# Patient Record
Sex: Male | Born: 1980 | Race: Black or African American | Hispanic: No | Smoking: Former smoker
Health system: Southern US, Community
[De-identification: ages and names within clinical notes are randomized; demographics above are authoritative.]

## PROBLEM LIST (undated history)

## (undated) DIAGNOSIS — E785 Hyperlipidemia, unspecified: Secondary | ICD-10-CM

## (undated) DIAGNOSIS — K219 Gastro-esophageal reflux disease without esophagitis: Secondary | ICD-10-CM

## (undated) DIAGNOSIS — M419 Scoliosis, unspecified: Secondary | ICD-10-CM

## (undated) DIAGNOSIS — T7840XA Allergy, unspecified, initial encounter: Secondary | ICD-10-CM

## (undated) DIAGNOSIS — G40909 Epilepsy, unspecified, not intractable, without status epilepticus: Secondary | ICD-10-CM

## (undated) DIAGNOSIS — F419 Anxiety disorder, unspecified: Secondary | ICD-10-CM

## (undated) DIAGNOSIS — F32A Depression, unspecified: Secondary | ICD-10-CM

## (undated) DIAGNOSIS — G4730 Sleep apnea, unspecified: Secondary | ICD-10-CM

## (undated) DIAGNOSIS — F4310 Post-traumatic stress disorder, unspecified: Secondary | ICD-10-CM

## (undated) DIAGNOSIS — I10 Essential (primary) hypertension: Secondary | ICD-10-CM

## (undated) DIAGNOSIS — G709 Myoneural disorder, unspecified: Secondary | ICD-10-CM

## (undated) DIAGNOSIS — M1990 Unspecified osteoarthritis, unspecified site: Secondary | ICD-10-CM

## (undated) DIAGNOSIS — J189 Pneumonia, unspecified organism: Secondary | ICD-10-CM

## (undated) DIAGNOSIS — M51369 Other intervertebral disc degeneration, lumbar region without mention of lumbar back pain or lower extremity pain: Secondary | ICD-10-CM

## (undated) DIAGNOSIS — D649 Anemia, unspecified: Secondary | ICD-10-CM

## (undated) DIAGNOSIS — M5126 Other intervertebral disc displacement, lumbar region: Secondary | ICD-10-CM

## (undated) DIAGNOSIS — F329 Major depressive disorder, single episode, unspecified: Secondary | ICD-10-CM

## (undated) HISTORY — DX: Allergy, unspecified, initial encounter: T78.40XA

## (undated) HISTORY — DX: Hyperlipidemia, unspecified: E78.5

## (undated) HISTORY — DX: Sleep apnea, unspecified: G47.30

## (undated) HISTORY — PX: NO PAST SURGERIES: SHX2092

---

## 2006-06-16 ENCOUNTER — Ambulatory Visit: Payer: Self-pay | Admitting: Internal Medicine

## 2006-06-16 ENCOUNTER — Encounter (INDEPENDENT_AMBULATORY_CARE_PROVIDER_SITE_OTHER): Payer: Self-pay | Admitting: Unknown Physician Specialty

## 2006-06-16 LAB — CONVERTED CEMR LAB: Varicella IgG: 4.14 — ABNORMAL HIGH

## 2008-03-11 ENCOUNTER — Ambulatory Visit: Payer: Self-pay | Admitting: Internal Medicine

## 2008-03-11 DIAGNOSIS — D649 Anemia, unspecified: Secondary | ICD-10-CM | POA: Insufficient documentation

## 2008-03-11 DIAGNOSIS — E1169 Type 2 diabetes mellitus with other specified complication: Secondary | ICD-10-CM | POA: Insufficient documentation

## 2008-03-11 DIAGNOSIS — E1165 Type 2 diabetes mellitus with hyperglycemia: Secondary | ICD-10-CM

## 2008-03-11 LAB — CONVERTED CEMR LAB
ALT: 34 units/L (ref 0–53)
Albumin: 3.9 g/dL (ref 3.5–5.2)
BUN: 10 mg/dL (ref 6–23)
Basophils Absolute: 0.4 10*3/uL — ABNORMAL HIGH (ref 0.0–0.1)
Bilirubin Urine: NEGATIVE
Calcium: 9.5 mg/dL (ref 8.4–10.5)
Cholesterol: 190 mg/dL (ref 0–200)
Creatinine, Ser: 1 mg/dL (ref 0.4–1.5)
Crystals: NEGATIVE
Eosinophils Absolute: 0.1 10*3/uL (ref 0.0–0.7)
GFR calc non Af Amer: 97 mL/min
Hemoglobin, Urine: NEGATIVE
Hemoglobin: 12.9 g/dL — ABNORMAL LOW (ref 13.0–17.0)
Hgb A1c MFr Bld: 7.9 % — ABNORMAL HIGH (ref 4.6–6.0)
LDL Cholesterol: 138 mg/dL — ABNORMAL HIGH (ref 0–99)
Leukocytes, UA: NEGATIVE
Lymphocytes Relative: 18.6 % (ref 12.0–46.0)
MCHC: 33.4 g/dL (ref 30.0–36.0)
MCV: 81.5 fL (ref 78.0–100.0)
Monocytes Absolute: 0.6 10*3/uL (ref 0.1–1.0)
Neutro Abs: 9.8 10*3/uL — ABNORMAL HIGH (ref 1.4–7.7)
Nitrite: NEGATIVE
RDW: 13.8 % (ref 11.5–14.6)
TSH: 1.03 microintl units/mL (ref 0.35–5.50)
Total Bilirubin: 0.6 mg/dL (ref 0.3–1.2)
Triglycerides: 120 mg/dL (ref 0–149)
Urobilinogen, UA: 0.2 (ref 0.0–1.0)
pH: 6 (ref 5.0–8.0)

## 2008-03-12 ENCOUNTER — Encounter: Payer: Self-pay | Admitting: Internal Medicine

## 2008-03-26 ENCOUNTER — Ambulatory Visit: Payer: Self-pay | Admitting: Internal Medicine

## 2008-03-26 LAB — CONVERTED CEMR LAB
Basophils Absolute: 0.1 10*3/uL (ref 0.0–0.1)
Hemoglobin: 13.7 g/dL (ref 13.0–17.0)
Iron: 41 ug/dL — ABNORMAL LOW (ref 42–165)
Lymphocytes Relative: 13 % (ref 12.0–46.0)
MCHC: 33.1 g/dL (ref 30.0–36.0)
Monocytes Relative: 1.4 % — ABNORMAL LOW (ref 3.0–12.0)
Neutro Abs: 9.4 10*3/uL — ABNORMAL HIGH (ref 1.4–7.7)
Neutrophils Relative %: 82.1 % — ABNORMAL HIGH (ref 43.0–77.0)
RBC: 5.14 M/uL (ref 4.22–5.81)
RDW: 13.9 % (ref 11.5–14.6)
Saturation Ratios: 11.3 % — ABNORMAL LOW (ref 20.0–50.0)
Transferrin: 258.3 mg/dL (ref >=212.0)
Vitamin B-12: 354 pg/mL (ref 211–911)

## 2008-03-27 ENCOUNTER — Encounter: Payer: Self-pay | Admitting: Internal Medicine

## 2008-03-29 ENCOUNTER — Encounter (INDEPENDENT_AMBULATORY_CARE_PROVIDER_SITE_OTHER): Payer: Self-pay | Admitting: *Deleted

## 2008-04-26 ENCOUNTER — Ambulatory Visit: Payer: Self-pay | Admitting: Internal Medicine

## 2008-04-26 LAB — CONVERTED CEMR LAB
AST: 15 units/L (ref 0–37)
Basophils Absolute: 0.3 10*3/uL — ABNORMAL HIGH (ref 0.0–0.1)
Bilirubin Urine: NEGATIVE
Bilirubin, Direct: 0.1 mg/dL (ref 0.0–0.3)
Chloride: 105 meq/L (ref 96–112)
Creatinine, Ser: 0.9 mg/dL (ref 0.4–1.5)
Creatinine,U: 267.1 mg/dL
GFR calc non Af Amer: 109 mL/min
Hemoglobin: 13 g/dL (ref 13.0–17.0)
Iron: 26 ug/dL — ABNORMAL LOW (ref 42–165)
Leukocytes, UA: NEGATIVE
Lymphocytes Relative: 13.2 % (ref 12.0–46.0)
MCHC: 33.3 g/dL (ref 30.0–36.0)
Microalb, Ur: 1.2 mg/dL (ref 0.0–1.9)
Monocytes Relative: 2.9 % — ABNORMAL LOW (ref 3.0–12.0)
Neutrophils Relative %: 80.5 % — ABNORMAL HIGH (ref 43.0–77.0)
Nitrite: NEGATIVE
Platelets: 357 10*3/uL (ref 150–400)
RBC / HPF: NONE SEEN
RDW: 13.6 % (ref 11.5–14.6)
Saturation Ratios: 8.4 % — ABNORMAL LOW (ref 20.0–50.0)
Sodium: 139 meq/L (ref 135–145)
Specific Gravity, Urine: 1.025 (ref 1.000–1.03)
Total Bilirubin: 0.6 mg/dL (ref 0.3–1.2)
Total Protein, Urine: NEGATIVE mg/dL
Transferrin: 220.9 mg/dL (ref >=212.0)
pH: 6 (ref 5.0–8.0)

## 2008-05-14 ENCOUNTER — Ambulatory Visit: Payer: Self-pay | Admitting: Gastroenterology

## 2008-05-21 ENCOUNTER — Ambulatory Visit: Payer: Self-pay | Admitting: Hematology and Oncology

## 2008-06-19 ENCOUNTER — Telehealth (INDEPENDENT_AMBULATORY_CARE_PROVIDER_SITE_OTHER): Payer: Self-pay

## 2008-06-21 ENCOUNTER — Encounter (INDEPENDENT_AMBULATORY_CARE_PROVIDER_SITE_OTHER): Payer: Self-pay

## 2009-01-30 ENCOUNTER — Emergency Department (HOSPITAL_COMMUNITY): Admission: EM | Admit: 2009-01-30 | Discharge: 2009-01-30 | Payer: Self-pay | Admitting: Emergency Medicine

## 2011-09-19 ENCOUNTER — Emergency Department (HOSPITAL_COMMUNITY)
Admission: EM | Admit: 2011-09-19 | Discharge: 2011-09-20 | Disposition: A | Discharge status: Home / Self Care | Payer: Managed Care, Other (non HMO) | Attending: Emergency Medicine | Admitting: Emergency Medicine

## 2011-09-19 ENCOUNTER — Encounter (HOSPITAL_COMMUNITY): Payer: Self-pay | Admitting: Emergency Medicine

## 2011-09-19 DIAGNOSIS — I10 Essential (primary) hypertension: Secondary | ICD-10-CM | POA: Insufficient documentation

## 2011-09-19 DIAGNOSIS — E119 Type 2 diabetes mellitus without complications: Secondary | ICD-10-CM | POA: Insufficient documentation

## 2011-09-19 LAB — DIFFERENTIAL
Basophils Absolute: 0 10*3/uL (ref 0.0–0.1)
Basophils Relative: 0 % (ref 0–1)
Eosinophils Absolute: 0.2 10*3/uL (ref 0.0–0.7)
Eosinophils Relative: 1 % (ref 0–5)

## 2011-09-19 LAB — CBC
MCH: 26.7 pg (ref 26.0–34.0)
MCV: 79 fL (ref 78.0–100.0)
Platelets: 316 10*3/uL (ref 150–400)
RDW: 13.8 % (ref 11.5–15.5)

## 2011-09-19 LAB — BASIC METABOLIC PANEL
Calcium: 9.5 mg/dL (ref 8.4–10.5)
GFR calc Af Amer: >90 mL/min (ref >90)
GFR calc non Af Amer: >90 mL/min (ref >90)
Sodium: 140 mEq/L (ref 135–145)

## 2011-09-19 NOTE — ED Notes (Signed)
PT. REPORTS ELEVATED BLOOD PRESSURE AT HOME THIS EVENING  BP= 180/130 , BRIEF RIGHT NARE BLEEDING AT HOME PTA, NO BLEDING AT TRIAGE. DENIES NAUSEA OR DIZZINESS, SLIGHT HEADACHE.

## 2011-09-20 LAB — HEMOGLOBIN A1C
Hgb A1c MFr Bld: 7.6 % — ABNORMAL HIGH (ref <5.7)
Mean Plasma Glucose: 171 mg/dL — ABNORMAL HIGH (ref <117)

## 2011-09-20 MED ORDER — ACETAMINOPHEN 325 MG PO TABS
650.0000 mg | ORAL_TABLET | Freq: Once | ORAL | Status: AC
  Administered 2011-09-20: 650 mg via ORAL
  Filled 2011-09-20: qty 2

## 2011-09-20 NOTE — ED Provider Notes (Signed)
Medical screening examination/treatment/procedure(s) were performed by non-physician practitioner and as supervising physician I was immediately available for consultation/collaboration.   Lyanne Co, MD 09/20/11 [phone]

## 2011-09-20 NOTE — Discharge Instructions (Signed)
You were seen and evaluated for your elevated blood pressure. Your lab tests today have not shown any concerns for kidney damage. Your blood sugar was slightly elevated at 172 and your white blood cell count was also slightly elevated today. It is recommended that you have a recheck of your blood tests by your primary care provider and discuss the need for treatment of your blood sugar. It is also recommended that he consider purchasing a home blood pressure device to check your blood pressures twice a day while you are relaxed and resting. You should take your blood pressure while sitting upright in a comfortable position with your arms hanging down. Return to the emergency room for any worsening symptoms.    Hypertension Information As your heart beats, it forces blood through your arteries. This force is your blood pressure. If the pressure is too high, it is called hypertension (HTN) or high blood pressure. HTN is dangerous because you may have it and not know it. High blood pressure may mean that your heart has to work harder to pump blood. Your arteries may be narrow or stiff. The extra work puts you at risk for heart disease, stroke, and other problems.  Blood pressure consists of two numbers, a higher number over a lower, 110/72, for example. It is stated as "110 over 72." The ideal is below 120 for the top number (systolic) and under 80 for the bottom (diastolic).  You should pay close attention to your blood pressure if you have certain conditions such as:  Heart failure.   Prior heart attack.   Diabetes   Chronic kidney disease.   Prior stroke.   Multiple risk factors for heart disease.  To see if you have HTN, your blood pressure should be measured while you are seated with your arm held at the level of the heart. It should be measured at least twice. A one-time elevated blood pressure reading (especially in the Emergency Department) does not mean that you need treatment. There may be  conditions in which the blood pressure is different between your right and left arms. It is important to see your caregiver soon for a recheck. Most people have essential hypertension which means that there is not a specific cause. This type of high blood pressure may be lowered by changing lifestyle factors such as:  Stress.   Smoking.   Lack of exercise.   Excessive weight.   Drug/tobacco/alcohol use.   Eating less salt.  Most people do not have symptoms from high blood pressure until it has caused damage to the body. Effective treatment can often prevent, delay or reduce that damage. TREATMENT  Treatment for high blood pressure, when a cause has been identified, is directed at the cause. There are a large number of medications to treat HTN. These fall into several categories, and your caregiver will help you select the medicines that are best for you. Medications may have side effects. You should review side effects with your caregiver. If your blood pressure stays high after you have made lifestyle changes or started on medicines,   Your medication(s) may need to be changed.   Other problems may need to be addressed.   Be certain you understand your prescriptions, and know how and when to take your medicine.   Be sure to follow up with your caregiver within the time frame advised (usually within two weeks) to have your blood pressure rechecked and to review your medications.   If you are taking  more than one medicine to lower your blood pressure, make sure you know how and at what times they should be taken. Taking two medicines at the same time can result in blood pressure that is too low.  Document Released: 06/29/2005 Document Revised: 01/06/2011 Document Reviewed: 07/06/2007 [facility] Patient Information 2012 [place], [place].   How to Take Your Blood Pressure  These instructions are only for electronic home blood pressure machines. You will need:   An automatic or  semi-automatic blood pressure machine.   Fresh batteries for the blood pressure machine.  HOW DO I USE THESE TOOLS TO CHECK MY BLOOD PRESSURE?   There are 2 numbers that make up your blood pressure. For example: 120/80.   The first number (120 in our example) is called the "systolic pressure." It is a measure of the pressure in your blood vessels when your heart is pumping blood.   The second number (80 in our example) is called the "diastolic pressure." It is a measure of the pressure in your blood vessels when your heart is resting between beats.   Before you buy a home blood pressure machine, check the size of your arm so you can buy the right size cuff. Here is how to check the size of your arm:   Use a tape measure that shows both inches and centimeters.   Wrap the tape measure around the middle upper part of your arm. You may need someone to help you measure right.   Write down your arm measurement in both inches and centimeters.   To measure your blood pressure right, it is important to have the right size cuff.   If your arm is up to 13 inches (37 to 34 centimeters), get an adult cuff size.   If your arm is 13 to 17 inches (35 to 44 centimeters), get a large adult cuff size.   If your arm is 17 to 20 inches (45 to 52 centimeters), get an adult thigh cuff.   Try to rest or relax for at least 30 minutes before you check your blood pressure.   Do not smoke.   Do not have any drinks with caffeine, such as:   Pop.   Coffee.   Tea.   Check your blood pressure in a quiet room.   Sit down and stretch out your arm on a table. Keep your arm at about the level of your heart. Let your arm relax.  GETTING BLOOD PRESSURE READINGS  Make sure you remove any tight-fighting clothing from your arm. Wrap the cuff around your upper arm. Wrap it just above the bend, and above where you felt the pulse. You should be able to slip a finger between the cuff and your arm. If you cannot slip  a finger in the cuff, it is too tight and should be removed and rewrapped.   Some units requires you to manually pump up the arm cuff.   Automatic units inflate the cuff when you press a button.   Cuff deflation is automatic in both models.   After the cuff is inflated, the unit measures your blood pressure and pulse. The readings are displayed on a monitor. Hold still and breathe normally while the cuff is inflated.   Getting a reading takes less than a minute.   Some models store readings in a memory. Some provide a printout of readings.   Get readings at different times of the day. You should wait at least 5 minutes between readings.  Take readings with you to your next doctor's visit.  Document Released: 04/08/2008 Document Revised: 04/15/2011 Document Reviewed: 04/08/2008 [facility] Patient Information 2012 [place], [place].   RESOURCE GUIDE  Dental Problems  Patients with Medicaid: [facility] [phone] [address]                                           [phone] [address] Phone:  [phone]                                                  Phone:  [phone]  If unable to pay or uninsured, contact:  [facility] or [facility]. to become qualified for the adult dental clinic.  Chronic Pain Problems Contact [facility]  [phone] Patients need to be referred by their primary care doctor.  Insufficient Money for Medicine Contact [employer]:  call "211" or [facility] [phone].  No Primary Care Doctor Call [facility]  312-101-6521 Other agencies that provide inexpensive medical care    [facility]  [phone]    [facility]  [phone]    [facility]  [phone]    [facility]  [phone]    [facility]  [phone]    [facility]  [phone]  Psychological Services [facility]  [phone] [facility] Services   [phone] [facility]   [phone] (emergency services [phone])  Substance Abuse Resources [facility]  [phone] [facility] [phone] [facility] [phone] Floydene Flock [phone] Residential & Outpatient Substance Abuse Program  [phone]  Abuse/Neglect [facility] Child Abuse Hotline [phone] [facility] Child Abuse Hotline [phone] (After Hours)  Emergency Shelter [facility] [phone]  Maternity Homes [facility] [place] [facility] [phone] [facility] 9595151597  MRSA Hotline #:   615 489 5901    [facility] Resources  [facility] [place]     [employer]                          [facility] Dept. [address] [place]                       [address]      [address]  [place]                                                Cristobal Goldmann Phone:  [phone]                                   Phone:  [phone]  Phone:  [phone]  [facility] Phone:  [phone]  [facility] Child Abuse Hotline [phone] [phone] (After Hours)

## 2011-09-20 NOTE — ED Provider Notes (Signed)
History     CSN: [id number]  Arrival date & time 09/19/11  2041   First MD Initiated Contact with Patient 09/19/11 2347      Chief Complaint  Patient presents with  . Hypertension    HPI  History provided by the patient. Patient is a 31 year old male prior history of diabetes who presents with concerns for elevated blood pressure. She works as a Radiation protection practitioner and states that he was checking his blood pressure while work today and it was elevated with a reading of 180/130. Patient also reports having a mild headache all day long and one episode of right epistaxis. Bleeding was controlled with pressure has not returned. Patient has not taken anything for his headache symptoms. He denies any focal neurologic deficits. He denies any chest pain or shortness of breath. Patient denies any nausea vomiting or diaphoresis. Symptoms are described as mild.    Past Medical History  Diagnosis Date  . Diabetes mellitus     No past surgical history on file.  No family history on file.  History  Substance Use Topics  . Smoking status: Current Everyday Smoker    Types: Cigars  . Smokeless tobacco: Not on file  . Alcohol Use: Yes      Review of Systems  Constitutional: Negative for fever and chills.  HENT: Negative for congestion, sore throat and rhinorrhea.   Respiratory: Negative for cough.   Gastrointestinal: Negative for nausea, vomiting, abdominal pain and diarrhea.  Genitourinary: Negative for dysuria.  Skin: Negative for rash.  Neurological: Positive for headaches. Negative for dizziness and light-headedness.    Allergies  Amoxicillin  Home Medications  No current outpatient prescriptions on file.  BP 149/96  Pulse 92  Temp(Src) 98.6 F (37 C) (Oral)  Resp 20  SpO2 97%  Physical Exam  Nursing note and vitals reviewed. Constitutional: He is oriented to person, place, and time. He appears well-developed and well-nourished. No distress.  HENT:  Head: Normocephalic and  atraumatic.  Mouth/Throat: Oropharynx is clear and moist.  Eyes: Conjunctivae and EOM are normal. Pupils are equal, round, and reactive to light.  Neck: Normal range of motion. Neck supple.       No meningeal signs  Cardiovascular: Normal rate and regular rhythm.   No murmur heard. Pulmonary/Chest: Effort normal and breath sounds normal. No respiratory distress. He has no wheezes. He has no rales.  Abdominal: Soft. There is no tenderness.       Obese  Neurological: He is alert and oriented to person, place, and time. He has normal strength. No cranial nerve deficit or sensory deficit. Gait normal.  Skin: Skin is warm.  Psychiatric: He has a normal mood and affect. His behavior is normal.    ED Course  Procedures    Results for orders placed during the hospital encounter of 09/19/11  CBC      Component Value Range   WBC 15.2 (*) 4.0 - 10.5 (K/uL)   RBC 4.76  4.22 - 5.81 (MIL/uL)   Hemoglobin 12.7 (*) 13.0 - 17.0 (g/dL)   HCT 03.4 (*) 74.2 - 52.0 (%)   MCV 79.0  78.0 - 100.0 (fL)   MCH 26.7  26.0 - 34.0 (pg)   MCHC 33.8  30.0 - 36.0 (g/dL)   RDW 59.5  63.8 - 75.6 (%)   Platelets 316  150 - 400 (K/uL)  DIFFERENTIAL      Component Value Range   Neutrophils Relative 71  43 - 77 (%)   Neutro  Abs 10.7 (*) 1.7 - 7.7 (K/uL)   Lymphocytes Relative 23  12 - 46 (%)   Lymphs Abs 3.4  0.7 - 4.0 (K/uL)   Monocytes Relative 5  3 - 12 (%)   Monocytes Absolute 0.8  0.1 - 1.0 (K/uL)   Eosinophils Relative 1  0 - 5 (%)   Eosinophils Absolute 0.2  0.0 - 0.7 (K/uL)   Basophils Relative 0  0 - 1 (%)   Basophils Absolute 0.0  0.0 - 0.1 (K/uL)  BASIC METABOLIC PANEL      Component Value Range   Sodium 140  135 - 145 (mEq/L)   Potassium 3.9  3.5 - 5.1 (mEq/L)   Chloride 102  96 - 112 (mEq/L)   CO2 29  19 - 32 (mEq/L)   Glucose, Bld 172 (*) 70 - 99 (mg/dL)   BUN 10  6 - 23 (mg/dL)   Creatinine, Ser 9.60  0.50 - 1.35 (mg/dL)   Calcium 9.5  8.4 - 45.4 (mg/dL)   GFR calc non Af Amer >90  >90  (mL/min)   GFR calc Af Amer >90  >90 (mL/min)       1. Hypertension       MDM  Patient seen and evaluated. Patient in no acute distress.  Patient discussed with attending. Elevated WBC likely related to stress and long working hours. She without any focal symptoms concerning for infections. Plan have patient followup with PCP for elevated blood sugar and blood pressures. Patient also advised to recheck of WBC.        Angus Seller, PA 09/20/11 0500

## 2013-10-16 ENCOUNTER — Emergency Department (HOSPITAL_COMMUNITY)
Admission: EM | Admit: 2013-10-16 | Discharge: 2013-10-16 | Disposition: A | Discharge status: Home / Self Care | Payer: Managed Care, Other (non HMO) | Attending: Emergency Medicine | Admitting: Emergency Medicine

## 2013-10-16 ENCOUNTER — Emergency Department (HOSPITAL_COMMUNITY): Payer: Managed Care, Other (non HMO)

## 2013-10-16 ENCOUNTER — Encounter (HOSPITAL_COMMUNITY): Payer: Self-pay | Admitting: Emergency Medicine

## 2013-10-16 DIAGNOSIS — R739 Hyperglycemia, unspecified: Secondary | ICD-10-CM

## 2013-10-16 DIAGNOSIS — F17200 Nicotine dependence, unspecified, uncomplicated: Secondary | ICD-10-CM | POA: Insufficient documentation

## 2013-10-16 DIAGNOSIS — E119 Type 2 diabetes mellitus without complications: Secondary | ICD-10-CM | POA: Insufficient documentation

## 2013-10-16 DIAGNOSIS — M7022 Olecranon bursitis, left elbow: Secondary | ICD-10-CM

## 2013-10-16 DIAGNOSIS — Z880 Allergy status to penicillin: Secondary | ICD-10-CM | POA: Insufficient documentation

## 2013-10-16 DIAGNOSIS — M7020 Olecranon bursitis, unspecified elbow: Secondary | ICD-10-CM | POA: Insufficient documentation

## 2013-10-16 DIAGNOSIS — I10 Essential (primary) hypertension: Secondary | ICD-10-CM | POA: Insufficient documentation

## 2013-10-16 HISTORY — DX: Essential (primary) hypertension: I10

## 2013-10-16 LAB — BASIC METABOLIC PANEL
BUN: 12 mg/dL (ref 6–23)
CALCIUM: 9.1 mg/dL (ref 8.4–10.5)
CO2: 25 meq/L (ref 19–32)
Chloride: 102 mEq/L (ref 96–112)
Creatinine, Ser: 0.69 mg/dL (ref 0.50–1.35)
GFR calc Af Amer: >90 mL/min (ref >90)
GFR calc non Af Amer: >90 mL/min (ref >90)
GLUCOSE: 154 mg/dL — AB (ref 70–99)
Potassium: 3.8 mEq/L (ref 3.7–5.3)
SODIUM: 139 meq/L (ref 137–147)

## 2013-10-16 LAB — CBC WITH DIFFERENTIAL/PLATELET
Basophils Absolute: 0 10*3/uL (ref 0.0–0.1)
Basophils Relative: 0 % (ref 0–1)
EOS PCT: 1 % (ref 0–5)
Eosinophils Absolute: 0.1 10*3/uL (ref 0.0–0.7)
HEMATOCRIT: 37.2 % — AB (ref 39.0–52.0)
HEMOGLOBIN: 12.3 g/dL — AB (ref 13.0–17.0)
LYMPHS ABS: 2.7 10*3/uL (ref 0.7–4.0)
LYMPHS PCT: 21 % (ref 12–46)
MCH: 26.4 pg (ref 26.0–34.0)
MCHC: 33.1 g/dL (ref 30.0–36.0)
MCV: 79.8 fL (ref 78.0–100.0)
MONO ABS: 0.6 10*3/uL (ref 0.1–1.0)
MONOS PCT: 4 % (ref 3–12)
Neutro Abs: 9.6 10*3/uL — ABNORMAL HIGH (ref 1.7–7.7)
Neutrophils Relative %: 74 % (ref 43–77)
PLATELETS: 335 10*3/uL (ref 150–400)
RBC: 4.66 MIL/uL (ref 4.22–5.81)
RDW: 14.3 % (ref 11.5–15.5)
WBC: 13 10*3/uL — AB (ref 4.0–10.5)

## 2013-10-16 LAB — CBG MONITORING, ED: Glucose-Capillary: 146 mg/dL — ABNORMAL HIGH (ref 70–99)

## 2013-10-16 MED ORDER — HYDROCODONE-ACETAMINOPHEN 5-325 MG PO TABS: 1.0000 | ORAL_TABLET | ORAL | Status: DC | PRN

## 2013-10-16 MED ORDER — NAPROXEN 500 MG PO TABS: 500.0000 mg | ORAL_TABLET | Freq: Two times a day (BID) | ORAL | Status: DC

## 2013-10-16 NOTE — Progress Notes (Signed)
[facility] provided pt with a list of primary care resources and a [facility] [employer] Card application to help patient establish primary care.  °

## 2013-10-16 NOTE — ED Provider Notes (Signed)
Medical screening examination/treatment/procedure(s) were performed by non-physician practitioner and as supervising physician I was immediately available for consultation/collaboration.   EKG Interpretation None        Merryl Hacker, MD 10/16/13 1447

## 2013-10-16 NOTE — ED Notes (Addendum)
Pt reports "scraping" L elbow on a metal door x 2 weeks ago.  Sts L elbow swelled, he scraped the scab off, he squeezed out yellowish/green pus, and elbow continues to be swollen.  Pain score 7/10.  Swelling noted to L elbow.  Laceration looks to be healed.

## 2013-10-16 NOTE — Discharge Instructions (Signed)
BP 146/98   Pulse 93   Temp(Src) 98.6 F (37 C) (Oral)   Resp 16   SpO2 100% Results for orders placed during the hospital encounter of 10/16/13  CBC WITH DIFFERENTIAL      Result Value Ref Range   WBC 13.0 (*) 4.0 - 10.5 K/uL   RBC 4.66  4.22 - 5.81 MIL/uL   Hemoglobin 12.3 (*) 13.0 - 17.0 g/dL   HCT 37.2 (*) 39.0 - 52.0 %   MCV 79.8  78.0 - 100.0 fL   MCH 26.4  26.0 - 34.0 pg   MCHC 33.1  30.0 - 36.0 g/dL   RDW 14.3  11.5 - 15.5 %   Platelets 335  150 - 400 K/uL   Neutrophils Relative % 74  43 - 77 %   Neutro Abs 9.6 (*) 1.7 - 7.7 K/uL   Lymphocytes Relative 21  12 - 46 %   Lymphs Abs 2.7  0.7 - 4.0 K/uL   Monocytes Relative 4  3 - 12 %   Monocytes Absolute 0.6  0.1 - 1.0 K/uL   Eosinophils Relative 1  0 - 5 %   Eosinophils Absolute 0.1  0.0 - 0.7 K/uL   Basophils Relative 0  0 - 1 %   Basophils Absolute 0.0  0.0 - 0.1 K/uL  BASIC METABOLIC PANEL      Result Value Ref Range   Sodium 139  137 - 147 mEq/L   Potassium 3.8  3.7 - 5.3 mEq/L   Chloride 102  96 - 112 mEq/L   CO2 25  19 - 32 mEq/L   Glucose, Bld 154 (*) 70 - 99 mg/dL   BUN 12  6 - 23 mg/dL   Creatinine, Ser 0.69  0.50 - 1.35 mg/dL   Calcium 9.1  8.4 - 10.5 mg/dL   GFR calc non Af Amer >90  >90 mL/min   GFR calc Af Amer >90  >90 mL/min   Your results are listed above. Please read the information below about your diagnosis.   Olecranon Bursitis Bursitis is swelling and soreness (inflammation) of a fluid-filled sac (bursa) that covers and protects a joint. Olecranon bursitis occurs over the elbow.  CAUSES Bursitis can be caused by injury, overuse of the joint, arthritis, or infection.  SYMPTOMS   Tenderness, swelling, warmth, or redness over the elbow.  Elbow pain with movement. This is greater with bending the elbow.  Squeaking sound when the bursa is rubbed or moved.  Increasing size of the bursa without pain or discomfort.  Fever with increasing pain and swelling if the bursa becomes infected. HOME  CARE INSTRUCTIONS   Put ice on the affected area.  Put ice in a plastic bag.  Place a towel between your skin and the bag.  Leave the ice on for 15-20 minutes each hour while awake. Do this for the first 2 days.  When resting, elevate your elbow above the level of your heart. This helps reduce swelling.  Continue to put the joint through a full range of motion 4 times per day. Rest the injured joint at other times. When the pain lessens, begin normal slow movements and usual activities.  Only take over-the-counter or prescription medicines for pain, discomfort, or fever as directed by your caregiver.  Reduce your intake of milk and related dairy products (cheese, yogurt). They may make your condition worse. SEEK IMMEDIATE MEDICAL CARE IF:   Your pain increases even during treatment.  You have a fever.  You have heat and inflammation over the bursa and elbow.  You have a red line that goes up your arm.  You have pain with movement of your elbow. MAKE SURE YOU:   Understand these instructions.  Will watch your condition.  Will get help right away if you are not doing well or get worse. Document Released: 05/26/2006 Document Revised: 07/19/2011 Document Reviewed: 04/11/2007 [facility] Patient Information 2014 [place], [place].   Emergency Department Resource Guide 1) Find a Doctor and Pay Out of Pocket Although you won't have to find out who is covered by your insurance plan, it is a good idea to ask around and get recommendations. You will then need to call the office and see if the doctor you have chosen will accept you as a new patient and what types of options they offer for patients who are self-pay. Some doctors offer discounts or will set up payment plans for their patients who do not have insurance, but you will need to ask so you aren't surprised when you get to your appointment.  2) Contact Your Local Health Department Not all health departments have doctors that can  see patients for sick visits, but many do, so it is worth a call to see if yours does. If you don't know where your local health department is, you can check in your phone book. The CDC also has a tool to help you locate your state's health department, and many state websites also have listings of all of their local health departments.  3) Find a [facility] If your illness is not likely to be very severe or complicated, you may want to try a walk in clinic. These are popping up all over the country in pharmacies, drugstores, and shopping centers. They're usually staffed by nurse practitioners or physician assistants that have been trained to treat common illnesses and complaints. They're usually fairly quick and inexpensive. However, if you have serious medical issues or chronic medical problems, these are probably not your best option.  No Primary Care Doctor: - Call Health Connect at  [phone] - they can help you locate a primary care doctor that  accepts your insurance, provides certain services, etc. - Physician Referral Service- [phone]  Chronic Pain Problems: Organization         Address  Phone   Notes  [facility]  [phone] Patients need to be referred by their primary care doctor.   Medication Assistance: Organization         Address  Phone   Notes  [facility] Medication [facility] [place] [place], [place] [address] [phone] --Must be a resident of [facility] -- Must have NO insurance coverage whatsoever (no Medicaid/ Medicare, etc.) -- The pt. MUST have a primary care doctor that directs their care regularly and follows them in the community   MedAssist  [phone]   [employer]  [phone]    Agencies that provide inexpensive medical care: Organization         Address  Phone   Notes  [place]  [phone]   [facility]    [phone]   [facility] [place], [place] [address] [phone]   [place] [address], [place] [phone]   Planned Parenthood    [phone]   [facility]    [phone]  Community Health and [place]  [address], [place] Phone:  [phone], Fax:  [phone] Hours of Operation:  9 am - 6 pm, M-F.  Also accepts Medicaid/Medicare and self-pay.  [facility] for [place], [address], [place] Phone: [phone], Fax: [phone]. Hours of Operation:  8:30 am - 5:30 pm, M-F.  Also accepts Medicaid and self-pay.  [facility] [place] [address] [employer] [place] Phone: [phone]   [place], [place], [place] [phone], Ext. [phone] Mondays & Thursdays: 7-9 AM.  First 15 patients are seen on a first come, first serve basis.    [place] Providers:  Organization         Address  Phone   Notes  [facility] [address], [place] [phone] Also accepts self-pay patients.  [facility] [address], [place]  [phone]   [place], [address], [place] [phone]   [facility] [address], [place] [phone]   Lucianne Lei [address], [place]   [phone] Only accepts [place] Access [place] patients after they have their name applied to their card.   Self-Pay (no insurance) in [facility]:  Organization         Address  Phone   Notes  Sickle Cell Patients, [facility] [place] [phone]   [facility] [place] [phone]   [facility] [place]  [place], [place], [place] [phone]   [facility]/Dr. Osei-Bonsu   [address] [place] or [address], [place] [phone] Phone number for both [place] and [name] locations is the same.  Urgent Medical and [facility] [address] [place] [phone]   [facility] [address], [place] or [address] [phone]   [facility] [address], [place] [phone], phone; [phone], fax Sees patients 1st and 3rd Saturday of every month.  Must not qualify for public or private insurance (i.e. Medicaid, Medicare, [place] Health Choice, Veterans' Benefits)  Household income should be no more than 200% of the poverty level The clinic cannot treat you if you are pregnant or think you are pregnant  Sexually transmitted diseases are not treated at the clinic.    Dental Care: Organization         Address  Phone  Notes  [facility] Department of [facility] [phone] Accepts children up to age 52 who are enrolled in [place] or [place]; pregnant women with a Medicaid card; and children who have applied for Medicaid or [place] Health Choice, but were declined, whose parents can pay a reduced fee at time of service.  [facility] Department of [facility]  [address], [place] [phone] Accepts children up to age 4 who are enrolled in [place] or [place]; pregnant women with a Medicaid card; and children who have applied for Medicaid or [place] Health Choice, but were declined, whose parents can pay a reduced fee at time of service.  [facility] Adult Dental Access PROGRAM  [phone]  [place], [place] [phone] Patients are seen by appointment only. Walk-ins are not accepted. [place] will see patients 36 years of age and older. Monday - Tuesday (8am-5pm) Most Wednesdays (8:30-5pm) $30 per visit, cash only  [facility] Adult Dental Access  PROGRAM  [address], [facility] [phone] Patients are seen by appointment only. Walk-ins are not accepted. [place] will see patients 60 years of age and older. One Wednesday Evening (Monthly: Volunteer Based).  $30 per visit, cash only  [place]  [phone] for adults; Children under age 80, call Graduate Pediatric Dentistry at [phone]. Children aged 76-14, please call [phone] to request a pediatric application.  Dental services are provided in all areas of dental care including fillings, crowns and bridges, complete and partial dentures, implants, gum treatment, root canals, and extractions. Preventive care is also provided. Treatment is provided to both adults and children. Patients are selected via a lottery and there is often a waiting list.   [facility] [address] [place]  [phone] [email or web address]   [facility] [address] [place], [place] [phone], Ext. [phone] Second and Fourth Thursday of each month, opens at 6:30 AM; Clinic ends at 9 AM.  Patients are seen on a first-come first-served basis, and a limited number are seen during each clinic.   [facility]  [address] [place]ard Danker [place], [place] [phone]   Eligibility Requirements You must have lived in [place], [place], or [place] counties for at least the last three months.   You cannot be eligible for state or federal sponsored [employer], including [employer], [place], or [employer].   You generally cannot be eligible for healthcare insurance through your employer.    How to apply: Eligibility screenings are held every Tuesday and Wednesday afternoon from 1:00 pm until 4:00 pm. You do not need an appointment for the interview!  [facility] [address] [place], [place]   [place]  [place] Department   [place]  [phone]    Behavioral Health Resources in the Community: Intensive Outpatient Programs Organization         Address  Phone  Notes  [place]. [address], [place], [place] [phone]   [facility] [address] [place], [place]   ADS: Alcohol & Drug S[facility] [address], [place], [place]   [place] [address]  [place], [place] or [phone]   Substance Abuse Resources Organization         Address  Phone  Notes  Alcohol and Drug Services  [phone]   [employer]  [phone]   [facility] [place]   Chinita Pester  [phone]   Residential & Outpatient Substance Abuse Program  [phone]   Psychological Services Organization         Address  Phone  Notes  [facility] [place]  [place]  [phone]   [place] [address] [place] or [phone]    Mobile Crisis Teams Organization         Address  Phone  Notes  [facility], Mobile Crisis Care Unit  [phone]   [facility]  [address] [place], [place]   Ivin Booty  [place] [phone]    Self-Help/Support Groups Organization         Address  Phone             Notes  [facility] [place] - variety of support groups  [place] Call for more information  Narcotics Anonymous (NA), [facility] [address], [employer] [place]  2 meetings at this location   Special educational needs teacher         Address  Phone  Notes  [facility] [place],    [place]  [phone]   [facility]  [address], [place] [id number], [place], [place]   [place], [place] [phone] Admissions: 8am-3pm M-F  Incentives Substance [place] [address]    [place], [place] [phone]   [facility] [address] [place], [place], [place]   [facility] [address]  [place], [place]   [facility] - Intensive Outpatient [address] Kristeen [address], [place], [place]   [facility] ([place].,  [place], [place] [phone] or [phone]   [facility] (RTS) [address], [place], [place] Accepts Medicaid  [facility] [place] [address],  [place] [phone] Substance Abuse/Addiction Treatment   [facility] Organization         Address  Phone  Notes  [facility]  [phone]   Domenic Schwab, PhD [address] Arlis Porta [place], [place]   [phone] or [phone]   [place], [place] [phone]   [facility] [address], [place], [place] [phone] Insurance/Medicaid/sponsorship through [facility] and Families [address] [place]                                    [place], [place] [phone] [place] [address], [place] [phone]    Dr. Adele Schilder  [phone]   [facility] Dept. 1) [address], [place] 2) [place] 3)  [address], [place] [phone]  [phone]   [place] [phone] or [phone] (After Hours)

## 2013-10-16 NOTE — ED Provider Notes (Signed)
CSN: [id number]     Arrival date & time 10/16/13  0865 History   First MD Initiated Contact with Patient 10/16/13 [phone]     Chief Complaint  Patient presents with  . Wound Check     (Consider location/radiation/quality/duration/timing/severity/associated sxs/prior Treatment) HPI  33-year-old male presents emergency Department with chief complaint of left elbow pain. His past medical history of diabetes and hypertension. He also reports dyslipidemia. The patient states that about 2 weeks ago he hit his elbow all the screen per hour and received a small scrape. He had some warmth and redness at that time. Patient the scab back and expressed some purulent discharge. He states that it seemed to be getting better however over the past few days he's had worsening pain and swelling in the left elbow. He denies any new injuries to the elbow. He states that the pain is 7/10, worse with movement of the elbow. He is somewhat thin swelling without redness. He denies any fevers or chills. The patient has been out of his insurance since January and has been off of all of his medications. He is unsure what his blood sugars have been running on a regular basis.   Past Medical History  Diagnosis Date  . Diabetes mellitus   . Hypertension    History reviewed. No pertinent past surgical history. History reviewed. No pertinent family history. History  Substance Use Topics  . Smoking status: Current Some Day Smoker    Types: Cigars  . Smokeless tobacco: Not on file  . Alcohol Use: Yes    Review of Systems Ten systems reviewed and are negative for acute change, except as noted in the HPI.    Allergies  Amoxicillin  Home Medications   Prior to Admission medications   Not on File   BP 146/98  Pulse 93  Temp(Src) 98.6 F (37 C) (Oral)  Resp 16  SpO2 100% Physical Exam  Nursing note and vitals reviewed. Constitutional: He appears well-developed and well-nourished. No distress.  HENT:  Head:  Normocephalic and atraumatic.  Eyes: Conjunctivae are normal. No scleral icterus.  Neck: Normal range of motion. Neck supple.  Cardiovascular: Normal rate, regular rhythm and normal heart sounds.   Pulmonary/Chest: Effort normal and breath sounds normal. No respiratory distress.  Abdominal: Soft. There is no tenderness.  Musculoskeletal: He exhibits no edema.  Left elbow with warmth and swelling ofver the olecranon bursa. No  No induration. Full ROM with pain. No bony tenderness. No streaking or fluctuance.   Neurological: He is alert.  Skin: Skin is warm and dry. He is not diaphoretic.  Psychiatric: His behavior is normal.    ED Course  Procedures (including critical care time) Labs Review Labs Reviewed  CBC WITH DIFFERENTIAL  BASIC METABOLIC PANEL  CBG MONITORING, ED    Imaging Review Dg Elbow Complete Left  10/16/2013   CLINICAL DATA:  Left elbow pain. Pain and swelling of the left elbow.  EXAM: LEFT ELBOW - COMPLETE 3+ VIEW  COMPARISON:  None.  FINDINGS: Soft tissue swelling is present over the olecranon, suggesting olecranon bursitis. There is no elbow effusion. No osteolysis. No fracture. Anatomic alignment of the elbow.  IMPRESSION: Soft tissue swelling over the olecranon, most compatible with olecranon bursitis.   Electronically Signed   By: Dereck Ligas M.D.   On: 10/16/2013 10:06     EKG Interpretation None      MDM   Final diagnoses:  Olecranon bursitis of left elbow  Hyperglycemia  Hypertension  10:14 AM BP 146/98  Pulse 93  Temp(Src) 98.6 F (37 C) (Oral)  Resp 16  SpO2 100% Patient with Left elbow pain. Image and workup pending. Highest on differential is olecranon bursitis.   11:29 AM. Patient seen in shared visit with Dr. Dina Rich. No concern for infection. Patient has chronic leukocytosis. Hypertension and hyperglycemia. Resource guide given for PCP. I personally reviewed the imaging tests through PACS system. I have reviewed and interpreted  Lab values. I reviewed available ER/hospitalization records through the EMR    Patient / Family / Caregiver informed of clinical course, understand medical decision-making process, and agree with plan.    Margarita Mail, PA-C 10/16/13 1132

## 2014-07-11 ENCOUNTER — Ambulatory Visit (INDEPENDENT_AMBULATORY_CARE_PROVIDER_SITE_OTHER): Payer: PRIVATE HEALTH INSURANCE | Admitting: Family Medicine

## 2014-07-11 VITALS — BP 130/80 | HR 104 | Temp 98.0°F | Resp 17 | Ht 73.5 in | Wt 334.0 lb

## 2014-07-11 DIAGNOSIS — Z79899 Other long term (current) drug therapy: Secondary | ICD-10-CM

## 2014-07-11 DIAGNOSIS — Z720 Tobacco use: Secondary | ICD-10-CM

## 2014-07-11 DIAGNOSIS — F4310 Post-traumatic stress disorder, unspecified: Secondary | ICD-10-CM

## 2014-07-11 DIAGNOSIS — E1165 Type 2 diabetes mellitus with hyperglycemia: Secondary | ICD-10-CM | POA: Diagnosis not present

## 2014-07-11 DIAGNOSIS — I10 Essential (primary) hypertension: Secondary | ICD-10-CM | POA: Diagnosis not present

## 2014-07-11 LAB — COMPREHENSIVE METABOLIC PANEL
ALBUMIN: 4.2 g/dL (ref 3.5–5.2)
ALT: 23 U/L (ref 0–53)
AST: 15 U/L (ref 0–37)
Alkaline Phosphatase: 88 U/L (ref 39–117)
BUN: 11 mg/dL (ref 6–23)
CALCIUM: 9.8 mg/dL (ref 8.4–10.5)
CO2: 26 mEq/L (ref 19–32)
Chloride: 103 mEq/L (ref 96–112)
Creat: 0.95 mg/dL (ref 0.50–1.35)
Glucose, Bld: 180 mg/dL — ABNORMAL HIGH (ref 70–99)
POTASSIUM: 4.1 meq/L (ref 3.5–5.3)
SODIUM: 140 meq/L (ref 135–145)
TOTAL PROTEIN: 7.5 g/dL (ref 6.0–8.3)
Total Bilirubin: 0.4 mg/dL (ref 0.2–1.2)

## 2014-07-11 LAB — CBC
HCT: 39.9 % (ref 39.0–52.0)
Hemoglobin: 13.4 g/dL (ref 13.0–17.0)
MCH: 26.2 pg (ref 26.0–34.0)
MCHC: 33.6 g/dL (ref 30.0–36.0)
MCV: 78.1 fL (ref 78.0–100.0)
MPV: 8.8 fL (ref 8.6–12.4)
Platelets: 337 10*3/uL (ref 150–400)
RBC: 5.11 MIL/uL (ref 4.22–5.81)
RDW: 14.3 % (ref 11.5–15.5)
WBC: 13.6 10*3/uL — ABNORMAL HIGH (ref 4.0–10.5)

## 2014-07-11 LAB — POCT GLYCOSYLATED HEMOGLOBIN (HGB A1C): HEMOGLOBIN A1C: 9

## 2014-07-11 LAB — TSH: TSH: 2.359 u[IU]/mL (ref 0.350–4.500)

## 2014-07-11 MED ORDER — BLOOD GLUCOSE MONITOR KIT: PACK | Status: AC

## 2014-07-11 MED ORDER — AMLODIPINE BESYLATE 5 MG PO TABS: 5.0000 mg | ORAL_TABLET | Freq: Every day | ORAL | Status: DC

## 2014-07-11 MED ORDER — CANAGLIFLOZIN 100 MG PO TABS: 100.0000 mg | ORAL_TABLET | Freq: Every day | ORAL | Status: DC

## 2014-07-11 MED ORDER — LISINOPRIL 40 MG PO TABS: 40.0000 mg | ORAL_TABLET | Freq: Every day | ORAL | Status: DC

## 2014-07-11 MED ORDER — CITALOPRAM HYDROBROMIDE 40 MG PO TABS: 40.0000 mg | ORAL_TABLET | Freq: Every day | ORAL | Status: DC

## 2014-07-11 MED ORDER — ASPIRIN EC 81 MG PO TBEC: 81.0000 mg | DELAYED_RELEASE_TABLET | Freq: Every day | ORAL | Status: DC

## 2014-07-11 NOTE — Patient Instructions (Signed)
Diabetes and Exercise Diabetes mellitus is a common, chronic disease, in which the pancreas is unable to adequately control blood glucose (sugar) levels. There are 2 types of diabetes. Type 1 diabetes patients are unable to produce insulin, a hormone that causes sugar in the blood to be stored in the body. People with type 1 diabetes may compensate by giving themselves injections of insulin. Type 2 diabetes involves not producing adequate amounts of insulin to control blood glucose levels. People with type 2 diabetes control their blood glucose by monitoring their food intake or by taking medicine. Exercise is an important part of diabetes treatment. During exercise, the muscles use a greater amount of glucose from the blood for energy. This lowers your blood glucose, which is the same effect you would get from taking insulin. It has been shown that endurance athletes are more sensitive to insulin than inactive people. SYMPTOMS  Many people with a mild case of diabetes have no symptoms. However, if left uncontrolled, diabetes can lead to several complications that could be prevented with treatment of the disease. General symptoms of diabetes include:   Frequent urination (polyuria).  Frequent thirst and drinking (polydipsia).  Increased food consumption (polyphagia).  Fatigue.  Poor exercise performance.  Blurred vision.  Inflammation of the vagina (vaginitis) caused by fungal infections.  Skin infections (uncommon).  Numbness in the feet, caused by nerve injury.  Kidney disease. CAUSES  The cause of most cases of diabetes is unknown. In children, diabetes is often due to an autoimmune response to the cells in the pancreas that make insulin. It is also linked with other diseases, such as cystic fibrosis. Diabetes may have a genetic link. PREVENTION  Athletes should strive to begin exercise with blood glucose in a well-controlled state.  Feet should always be kept clean and  dry.  Activities in which low blood sugar levels cannot be treated easily (scuba diving, rock climbing, swimming) should be avoided.  Anticipate alterations in diet or training to avoid low blood sugar (hypoglycemia) and high blood sugar (hyperglycemia).  Athletes should try to increase sugar consumption after strenuous exercise to avoid hypoglycemia.  Short-acting insulin should not be injected into an actively exercising muscle. The athlete should rest the injection site for about 1 hour after exercise.  Patients with diabetes should get routine checkups of the feet to prevent complications. PROGNOSIS  Exercise provides many benefits to the person with diabetes:   Reduced body fat.  Lower blood pressure.  Often, reduced need for medicines.  Improved exercise tolerance.  Lower insulin levels.  Weight loss.  Improved lipid profile (decreased cholesterol and low-density lipoproteins). RELATED COMPLICATIONS  If performed incorrectly, exercise can result in complications of diabetes:   Poor control of blood sugar, when exercise is performed at the wrong time.  Increase in renal disease, from loss of body fluids (dehydration).  Increased risk of nerve injury (neuropathy) when performing exercises that increase foot injury.  Increased risk of eye problems when performing activities that involve breath holding or lowering or jarring the head.  Increased risk of sudden death from exercise in patients with heart disease.  Worsening of hypertension with heavy lifting (more than 10 lb/4.5 kg). Altered blood glucose and insulin dose as a result of mild illness that produces loss of appetite.  Altered uptake of insulin after injection when insulin injection site is changed. NOTE: Exercise can lower blood glucose effectively, but the effects are short-lasting (no more than a couple of days). Exercise has been shown to  improve your sensitivity to insulin. This may alter how your body  responds to a given dose of injected insulin. It is important for every patient with diabetes to know how his or her body may react to exercise, and to adjust insulin dosages accordingly. TREATMENT  Eat about 1 to 3 hours before exercise.  Check blood glucose immediately before and after exercise.  Stop exercise if blood glucose is more than 250 mg/dL.  Stop exercise if blood glucose is less than 100 mg/dL.  Do not exercise within 1 hour of an insulin injection.  Be prepared to treat low blood glucose while exercising. Keep some sugar product with you, such as a candy bar.  For prolonged exercise, use a sports drink to maintain your glucose level.  Replace used-up glucose in the body after exercise.  Consume fluids during and after exercise to avoid dehydration. SEEK MEDICAL CARE IF:  You have vision changes after a run.  You notice a loss of sensation in your feet after exercise.  You have increased numbness, tingling, or pins and needles sensations after exercise.  You have chest pain during or after exercise.  You have a fast, irregular heartbeat (palpitations) during or after exercise.  Your exercise tolerance gets worse.  You have fainting or dizzy spells for brief periods during or after exercise. Document Released: 04/26/2005 Document Revised: 09/10/2013 Document Reviewed: 08/08/2008 [facility] Patient Information 2015 [place], [place]. This information is not intended to replace advice given to you by your health care provider. Make sure you discuss any questions you have with your health care provider. Diabetes and Standards of Medical Care Diabetes is complicated. You may find that your diabetes team includes a dietitian, nurse, diabetes educator, eye doctor, and more. To help everyone know what is going on and to help you get the care you deserve, the following schedule of care was developed to help keep you on track. Below are the tests, exams, vaccines, medicines,  education, and plans you will need. HbA1c test This test shows how well you have controlled your glucose over the past 2-3 months. It is used to see if your diabetes management plan needs to be adjusted.   It is performed at least 2 times a year if you are meeting treatment goals.  It is performed 4 times a year if therapy has changed or if you are not meeting treatment goals. Blood pressure test  This test is performed at every routine medical visit. The goal is less than 140/90 mm Hg for most people, but 130/80 mm Hg in some cases. Ask your health care provider about your goal. Dental exam  Follow up with the dentist regularly. Eye exam  If you are diagnosed with type 1 diabetes as a child, get an exam upon reaching the age of 31 years or older and have had diabetes for 3-5 years. Yearly eye exams are recommended after that initial eye exam.  If you are diagnosed with type 1 diabetes as an adult, get an exam within 5 years of diagnosis and then yearly.  If you are diagnosed with type 2 diabetes, get an exam as soon as possible after the diagnosis and then yearly. Foot care exam  Visual foot exams are performed at every routine medical visit. The exams check for cuts, injuries, or other problems with the feet.  A comprehensive foot exam should be done yearly. This includes visual inspection as well as assessing foot pulses and testing for loss of sensation.  Check your  feet nightly for cuts, injuries, or other problems with your feet. Tell your health care provider if anything is not healing. Kidney function test (urine microalbumin)  This test is performed once a year.  Type 1 diabetes: The first test is performed 5 years after diagnosis.  Type 2 diabetes: The first test is performed at the time of diagnosis.  A serum creatinine and estimated glomerular filtration rate (eGFR) test is done once a year to assess the level of chronic kidney disease (CKD), if present. Lipid profile  (cholesterol, HDL, LDL, triglycerides)  Performed every 5 years for most people.  The goal for LDL is less than 100 mg/dL. If you are at high risk, the goal is less than 70 mg/dL.  The goal for HDL is 40 mg/dL-50 mg/dL for men and 50 mg/dL-60 mg/dL for women. An HDL cholesterol of 60 mg/dL or higher gives some protection against heart disease.  The goal for triglycerides is less than 150 mg/dL. Influenza vaccine, pneumococcal vaccine, and hepatitis B vaccine  The influenza vaccine is recommended yearly.  It is recommended that people with diabetes who are over 11 years old get the pneumonia vaccine. In some cases, two separate shots may be given. Ask your health care provider if your pneumonia vaccination is up to date.  The hepatitis B vaccine is also recommended for adults with diabetes. Diabetes self-management education  Education is recommended at diagnosis and ongoing as needed. Treatment plan  Your treatment plan is reviewed at every medical visit. Document Released: 02/21/2009 Document Revised: 09/10/2013 Document Reviewed: 09/26/2012 [facility] Patient Information 2015 [place], [place]. This information is not intended to replace advice given to you by your health care provider. Make sure you discuss any questions you have with your health care provider.

## 2014-07-11 NOTE — Progress Notes (Signed)
Subjective:    Patient ID: [name], male    DOB: 26-Apr-1981, 34 y.o.   MRN: [id number] This chart was scribed for Delman Cheadle, MD by Zola Button, Medical Scribe. This patient was seen in Room 12 and the patient's care was started at 2:12 PM.   Chief Complaint  Patient presents with  . Cough  . medication request    HPI HPI Comments: [name] is a 34 y.o. male with a hx of DM, HTN and PTSD who presents to the [facility] wanting to start on DM and HTN medications again. Patient was at the ER last month and was diagnosed with bronchitis; at that time, he was told his blood sugar and blood pressure were elevated and was recommended to follow-up on that. He had been off of medications for a year; he stopped his medications because he was able to get his DM and HTN under control.  Hypertension: Patient had been on HCTZ and lisinopril in the past for blood pressure. His diastolic blood pressure was still high on those medications, but he notes that his systolic blood pressure went down. He did have urinary frequency as a side effect, but he states it did not bother him too much. Patient does have a blood pressure cuff at home. He has been getting blood pressure readings around 150/100 for the past month. Patient notes that he limits salt intake in his diet, but he does drink caffeine (coffee and soft drinks). He states he does get plenty of fluids. He has been having HA with varying severity depending on the day. Patient denies CP, SOB and visual changes. He also denies FMHx of HTN.   Diabetes: Patient had been on metformin in the past for about 1 month, but he stopped because it was making him feel sick. The last DM medication he was on Invokana, which he states he did fine on. He denies any UTI while being on the medications. He has not had an A1C check recently. Patient last ate at 8:00 AM this morning.  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 He has  been to a therapist/psychiatrist in the past, which he states was helpful. He responded very well to citalopram prior.  No PCP; he was seen at the [facility] in [facility] before he moved  Past Medical History  Diagnosis Date  . Diabetes mellitus   . Hypertension    No current outpatient prescriptions on file prior to visit.   No current facility-administered medications on file prior to visit.   Allergies  Allergen Reactions  . Amoxicillin Anaphylaxis  . Other Other (See Comments)    Powder in some gloves - localized itching but not allergic to latex, benadryl usually helps with this reaction    Review of Systems  Constitutional: Positive for appetite change and fatigue. Negative for fever, chills, activity change and unexpected weight change.  Eyes: Negative for visual disturbance.  Respiratory: Negative for chest tightness and shortness of breath.   Cardiovascular: Negative for chest pain and leg swelling.  Gastrointestinal: Negative for nausea, vomiting, abdominal pain, diarrhea and constipation.  Endocrine: Positive for polyuria. Negative for polydipsia and polyphagia.  Genitourinary: Positive for frequency.  Neurological: Positive for headaches. Negative for dizziness, syncope, facial asymmetry, weakness, light-headedness and numbness.  Psychiatric/Behavioral: Positive for sleep disturbance and dysphoric mood.       Objective:  BP 130/80 mmHg  Pulse 104  Temp(Src) 98 F (36.7  C) (Oral)  Resp 17  Ht 6' 1.5" (1.867 m)  Wt 334 lb (151.501 kg)  BMI 43.46 kg/m2  SpO2 97%  Physical Exam  Constitutional: He is oriented to person, place, and time. He appears well-developed and well-nourished. No distress.  HENT:  Head: Normocephalic and atraumatic.  Mouth/Throat: Oropharynx is clear and moist. No oropharyngeal exudate.  Eyes: Pupils are equal, round, and reactive to light.  Neck: Neck supple.  Cardiovascular: Normal rate.   Pulmonary/Chest: Effort normal.    Musculoskeletal: He exhibits no edema.  Neurological: He is alert and oriented to person, place, and time. No cranial nerve deficit.  Skin: Skin is warm and dry. No rash noted.  Psychiatric: He has a normal mood and affect. His behavior is normal.  Nursing note and vitals reviewed.         Assessment & Plan:   Type 2 diabetes mellitus with hyperglycemia - Plan: POCT glycosylated hemoglobin (Hb A1C), Microalbumin, urine, CBC, blood glucose meter kit and supplies KIT - pt reports he did very well on Invokana previously so restart - check cbgs fasting and some 2 hrs pp and if still above goal after several wks may want to consider weaning up on the [place].  Polypharmacy  Essential hypertension, benign - Plan: Comprehensive metabolic panel, CBC - difficult for pt to be on diuretic due to work (12hrs in an ambulence, variable breaks) so will try pt on amlodipine and d/c hctz.  Restart lisinopril. Reviewed side effects.  PTSD (post-traumatic stress disorder) - responded well to citalopram prior to restart - wean up as tolerated by side effects.   Tobacco abuse  Severe obesity (BMI >= 40) - Plan: TSH  Meds ordered this encounter  Medications  . canagliflozin (INVOKANA) 100 MG TABS tablet    Sig: Take 1 tablet (100 mg total) by mouth daily.    Dispense:  30 tablet    Refill:  3  . aspirin EC 81 MG tablet    Sig: Take 1 tablet (81 mg total) by mouth daily.  [name] lisinopril (PRINIVIL,ZESTRIL) 40 MG tablet    Sig: Take 1 tablet (40 mg total) by mouth daily.    Dispense:  90 tablet    Refill:  1  . amLODipine (NORVASC) 5 MG tablet    Sig: Take 1 tablet (5 mg total) by mouth daily.    Dispense:  90 tablet    Refill:  1  . citalopram (CELEXA) 40 MG tablet    Sig: Take 1 tablet (40 mg total) by mouth daily. Start 1/2 tab po qd x 1 wk    Dispense:  90 tablet    Refill:  1  . blood glucose meter kit and supplies KIT    Sig: Test blood sugar twice a day as directed    Dispense:  1 each     Refill:  11    Order Specific Question:  Number of strips    Answer:  100    Order Specific Question:  Number of lancets    Answer:  100    I personally performed the services described in this documentation, which was scribed in my presence. The recorded information has been reviewed and considered, and addended by me as needed.  Delman Cheadle, MD MPH   Results for orders placed or performed in visit on 07/11/14  Microalbumin, urine  Result Value Ref Range   Microalb, Ur 5.2 (H) <2.0 mg/dL  Comprehensive metabolic panel  Result Value Ref Range  Sodium 140 135 - 145 mEq/L   Potassium 4.1 3.5 - 5.3 mEq/L   Chloride 103 96 - 112 mEq/L   CO2 26 19 - 32 mEq/L   Glucose, Bld 180 (H) 70 - 99 mg/dL   BUN 11 6 - 23 mg/dL   Creat 0.95 0.50 - 1.35 mg/dL   Total Bilirubin 0.4 0.2 - 1.2 mg/dL   Alkaline Phosphatase 88 39 - 117 U/L   AST 15 0 - 37 U/L   ALT 23 0 - 53 U/L   Total Protein 7.5 6.0 - 8.3 g/dL   Albumin 4.2 3.5 - 5.2 g/dL   Calcium 9.8 8.4 - 10.5 mg/dL  CBC  Result Value Ref Range   WBC 13.6 (H) 4.0 - 10.5 K/uL   RBC 5.11 4.22 - 5.81 MIL/uL   Hemoglobin 13.4 13.0 - 17.0 g/dL   HCT 39.9 39.0 - 52.0 %   MCV 78.1 78.0 - 100.0 fL   MCH 26.2 26.0 - 34.0 pg   MCHC 33.6 30.0 - 36.0 g/dL   RDW 14.3 11.5 - 15.5 %   Platelets 337 150 - 400 K/uL   MPV 8.8 8.6 - 12.4 fL  TSH  Result Value Ref Range   TSH 2.359 0.350 - 4.500 uIU/mL  POCT glycosylated hemoglobin (Hb A1C)  Result Value Ref Range   Hemoglobin A1C 9.0     -

## 2014-07-12 LAB — MICROALBUMIN, URINE: Microalb, Ur: 5.2 mg/dL — ABNORMAL HIGH (ref <2.0)

## 2014-07-15 ENCOUNTER — Encounter: Payer: Self-pay | Admitting: Family Medicine

## 2014-09-11 ENCOUNTER — Ambulatory Visit (INDEPENDENT_AMBULATORY_CARE_PROVIDER_SITE_OTHER): Payer: PRIVATE HEALTH INSURANCE | Admitting: Physician Assistant

## 2014-09-11 ENCOUNTER — Ambulatory Visit: Payer: PRIVATE HEALTH INSURANCE | Admitting: Family Medicine

## 2014-09-11 ENCOUNTER — Encounter: Payer: Self-pay | Admitting: Physician Assistant

## 2014-09-11 VITALS — BP 162/113 | HR 107 | Temp 98.3°F | Resp 16 | Ht 73.75 in | Wt 341.8 lb

## 2014-09-11 DIAGNOSIS — K219 Gastro-esophageal reflux disease without esophagitis: Secondary | ICD-10-CM | POA: Diagnosis not present

## 2014-09-11 DIAGNOSIS — E1165 Type 2 diabetes mellitus with hyperglycemia: Secondary | ICD-10-CM | POA: Diagnosis not present

## 2014-09-11 DIAGNOSIS — N508 Other specified disorders of male genital organs: Secondary | ICD-10-CM

## 2014-09-11 DIAGNOSIS — R079 Chest pain, unspecified: Secondary | ICD-10-CM

## 2014-09-11 DIAGNOSIS — R000 Tachycardia, unspecified: Secondary | ICD-10-CM | POA: Diagnosis not present

## 2014-09-11 DIAGNOSIS — N50812 Left testicular pain: Secondary | ICD-10-CM

## 2014-09-11 LAB — GLUCOSE, POCT (MANUAL RESULT ENTRY): POC GLUCOSE: 196 mg/dL — AB (ref 70–99)

## 2014-09-11 LAB — POCT CBC
Granulocyte percent: 70.7 %G (ref 37–80)
HEMATOCRIT: 42 % — AB (ref 43.5–53.7)
HEMOGLOBIN: 13.3 g/dL — AB (ref 14.1–18.1)
LYMPH, POC: 3.3 (ref 0.6–3.4)
MCH: 25.2 pg — AB (ref 27–31.2)
MCHC: 31.7 g/dL — AB (ref 31.8–35.4)
MCV: 79.6 fL — AB (ref 80–97)
MID (cbc): 0.4 (ref 0–0.9)
MPV: 6.5 fL (ref 0–99.8)
POC GRANULOCYTE: 9 — AB (ref 2–6.9)
POC LYMPH %: 26.2 % (ref 10–50)
POC MID %: 3.1 % (ref 0–12)
Platelet Count, POC: 361 10*3/uL (ref 142–424)
RBC: 5.28 M/uL (ref 4.69–6.13)
RDW, POC: 14.8 %
WBC: 12.7 10*3/uL — AB (ref 4.6–10.2)

## 2014-09-11 LAB — POCT UA - MICROSCOPIC ONLY
Bacteria, U Microscopic: NEGATIVE
Casts, Ur, LPF, POC: NEGATIVE
Crystals, Ur, HPF, POC: NEGATIVE
Mucus, UA: NEGATIVE
RBC, urine, microscopic: NEGATIVE
WBC, UR, HPF, POC: NEGATIVE
YEAST UA: NEGATIVE

## 2014-09-11 LAB — POCT URINALYSIS DIPSTICK
BILIRUBIN UA: NEGATIVE
Blood, UA: NEGATIVE
GLUCOSE UA: 250
Ketones, UA: 15
LEUKOCYTES UA: NEGATIVE
NITRITE UA: NEGATIVE
Protein, UA: NEGATIVE
Spec Grav, UA: 1.02
Urobilinogen, UA: 0.2
pH, UA: 6.5

## 2014-09-11 MED ORDER — RANITIDINE HCL 150 MG PO TABS: 150.0000 mg | ORAL_TABLET | Freq: Two times a day (BID) | ORAL | Status: DC

## 2014-09-11 MED ORDER — SITAGLIPTIN PHOSPHATE 100 MG PO TABS: 100.0000 mg | ORAL_TABLET | Freq: Every day | ORAL | Status: DC

## 2014-09-11 MED ORDER — DOXYCYCLINE HYCLATE 100 MG PO CAPS: 100.0000 mg | ORAL_CAPSULE | Freq: Two times a day (BID) | ORAL | Status: AC

## 2014-09-11 NOTE — Progress Notes (Signed)
[facility] [address], [place] [address] [phone]  Date:  09/11/2014   Name:  KRISTOFFER BALA   DOB:  05-08-1981   MRN:  [id number]  PCP:  Default, Provider, MD    Chief Complaint: Abdominal Pain; Chest Pain; Groin Swelling; and Nausea   History of Present Illness:  GERVASE COLBERG is a 34 y.o. very pleasant male patient who presents with the following:  Patient is her today complaining of left testicular pain that has been prominent for 2 months.  It is intermittent and dull.  He states that he can appreciate a mass just above his testicle that is tender.  He also complains of abdominal pain for 2 weeks, along the center of his abdomen.  Speaking further, it is incited by the testicular pain.  He has no rash.  He states that he has had difficulty with ejaculation, though he claims this is secondary to taking the anti-hypertensives.  His last sexual encounter was last week, which he states he was not able to complete the intercourse, but did not encounter any testicular pain with it.  He has no fever.  He has no penile pain or discharge.  He became concerned today when he noticed chest pain at his left side since yesterday.  It is random and not associated with exertion, sob, diaphoresis, nausea, vomiting, or palpitations.  He has no cough.  The abdominal pain includes mild nausea without emesis.  He has no hematuria, dysuria, frequency, dribbling, or incontinence.  He has a diarrhea, but claims this has been present since child hood.  There is no dizziness or disorientation, he is fatigued, though he has worked 72 hours within the last 5 days.  To note, he has not taken any medication for his diabetes mellitus.  He was dxd about 6 years ago with type 2.  He was to have invokana (states metformin had too many side effects), but difficult to obtain with his insurance.  He takes his blood glucose about 3x per week when he is working.  Range is 200-250.  He has no  restrained diet, but claims that he eats very little.  He walks when he can for exercise.      He had some difficulty with ejactulation with the start of the amlodopine and .  It is very tender, 2 months.   The abdominal pain started two weeks ago.  And the chest pain started at the left side.  The pain comes and goes.  It is random and not at rest or exertion.  No sob, diaphoresis, nausea, vomiting, palptitations.  Dull aching pain.  He is not coughing.  NMausea with abdominal pain.  Testicular pain increases and incites abdominal.  He has a hx of diarrhea even in childhood.  May have one formed stool per week.    Every three days between 200 and 250.  No vision changes, no blurry vision.  He has a headache.  72 hours for the last 5 days.  No fever.  amlodopine or lisinopril, and celexa off since Saturday.    Patient Active Problem List   Diagnosis Date Noted  . DIABETES MELLITUS, TYPE II, UNCONTROLLED, WITH KETOACIDOSIS 03/11/2008  . UNSPECIFIED ANEMIA 03/11/2008    Past Medical History  Diagnosis Date  . Diabetes mellitus   . Hypertension     No past surgical history on file.  History  Substance Use Topics  . Smoking status: Current Some Day Smoker  Types: Cigars  . Smokeless tobacco: Not on file  . Alcohol Use: Yes    No family history on file.  Allergies  Allergen Reactions  . Amoxicillin Anaphylaxis  . Other Other (See Comments)    Powder in some gloves - localized itching but not allergic to latex, benadryl usually helps with this reaction    Medication list has been reviewed and updated.  Current Outpatient Prescriptions on File Prior to Visit  Medication Sig Dispense Refill  . amLODipine (NORVASC) 5 MG tablet Take 1 tablet (5 mg total) by mouth daily. 90 tablet 1  . aspirin EC 81 MG tablet Take 1 tablet (81 mg total) by mouth daily.    . blood glucose meter kit and supplies KIT Test blood sugar twice a day as directed 1 each 11  . citalopram (CELEXA) 40 MG  tablet Take 1 tablet (40 mg total) by mouth daily. Start 1/2 tab po qd x 1 wk 90 tablet 1  . lisinopril (PRINIVIL,ZESTRIL) 40 MG tablet Take 1 tablet (40 mg total) by mouth daily. 90 tablet 1  . canagliflozin (INVOKANA) 100 MG TABS tablet Take 1 tablet (100 mg total) by mouth daily. (Patient not taking: Reported on 09/11/2014) 30 tablet 3   No current facility-administered medications on file prior to visit.    Review of Systems: ROS otherwise unremarkable unless listed above.   Physical Examination: Filed Vitals:   09/11/14 1607  BP: 162/113  Pulse: 107  Temp: 98.3 F (36.8 C)  Resp: 16   Filed Vitals:   09/11/14 1607  Height: 6' 1.75" (1.873 m)  Weight: 341 lb 12.8 oz (155.039 kg)   Body mass index is 44.19 kg/(m^2). Ideal Body Weight: Weight in (lb) to have BMI = 25: 193  Wt Readings from Last 3 Encounters:  09/11/14 341 lb 12.8 oz (155.039 kg)  07/11/14 334 lb (151.501 kg)  05/14/08 326 lb 4 oz (147.986 kg)    .Physical Exam  Constitutional: He appears well-developed and well-nourished.  Cardiovascular: Normal rate, regular rhythm and normal heart sounds.  Exam reveals no friction rub.   No murmur heard. Pulses:      Radial pulses are 2+ on the right side, and 2+ on the left side.       Dorsalis pedis pulses are 2+ on the right side, and 2+ on the left side.  Pulmonary/Chest: Effort normal and breath sounds normal. No respiratory distress. He has no wheezes.  Abdominal: Soft. Bowel sounds are normal. He exhibits no abdominal bruit and no mass. There is tenderness (llq tenderness). No hernia. Hernia confirmed negative in the right inguinal area and confirmed negative in the left inguinal area.  Genitourinary: Testes normal. Cremasteric reflex is present. Right testis shows no mass and no tenderness. Uncircumcised. No phimosis or paraphimosis.  Right testicle with very minimal swelling at the epididymis.  No mass appreciated at the testicle.    Lymphadenopathy:    He has  no cervical adenopathy. No inguinal adenopathy noted on the right or left side.       Right: No inguinal adenopathy present.       Left: No inguinal adenopathy present.   Results for orders placed or performed in visit on 09/11/14  GC/Chlamydia Probe Amp  Result Value Ref Range   CT Probe RNA NEGATIVE    GC Probe RNA NEGATIVE   COMPLETE METABOLIC PANEL WITH GFR  Result Value Ref Range   Sodium 138 135 - 145 mEq/L   Potassium 4.1 3.5 -  5.3 mEq/L   Chloride 101 96 - 112 mEq/L   CO2 25 19 - 32 mEq/L   Glucose, Bld 182 (H) 70 - 99 mg/dL   BUN 10 6 - 23 mg/dL   Creat 0.85 0.50 - 1.35 mg/dL   Total Bilirubin 0.4 0.2 - 1.2 mg/dL   Alkaline Phosphatase 105 39 - 117 U/L   AST 34 0 - 37 U/L   ALT 46 0 - 53 U/L   Total Protein 7.3 6.0 - 8.3 g/dL   Albumin 4.2 3.5 - 5.2 g/dL   Calcium 9.2 8.4 - 10.5 mg/dL   GFR, Est African American >89 mL/min   GFR, Est Non African American >89 mL/min  POCT CBC  Result Value Ref Range   WBC 12.7 (A) 4.6 - 10.2 K/uL   Lymph, poc 3.3 0.6 - 3.4   POC LYMPH PERCENT 26.2 10 - 50 %L   MID (cbc) 0.4 0 - 0.9   POC MID % 3.1 0 - 12 %M   POC Granulocyte 9.0 (A) 2 - 6.9   Granulocyte percent 70.7 37 - 80 %G   RBC 5.28 4.69 - 6.13 M/uL   Hemoglobin 13.3 (A) 14.1 - 18.1 g/dL   HCT, POC 42.0 (A) 43.5 - 53.7 %   MCV 79.6 (A) 80 - 97 fL   MCH, POC 25.2 (A) 27 - 31.2 pg   MCHC 31.7 (A) 31.8 - 35.4 g/dL   RDW, POC 14.8 %   Platelet Count, POC 361.0 142 - 424 K/uL   MPV 6.5 0 - 99.8 fL  POCT glucose (manual entry)  Result Value Ref Range   POC Glucose 196 (A) 70 - 99 mg/dl  POCT UA - Microscopic Only  Result Value Ref Range   WBC, Ur, HPF, POC neg    RBC, urine, microscopic neg    Bacteria, U Microscopic neg    Mucus, UA neg    Epithelial cells, urine per micros 0-2    Crystals, Ur, HPF, POC neg    Casts, Ur, LPF, POC neg    Yeast, UA neg   POCT urinalysis dipstick  Result Value Ref Range   Color, UA yellow    Clarity, UA clear    Glucose, UA 250     Bilirubin, UA neg    Ketones, UA 15    Spec Grav, UA 1.020    Blood, UA neg    pH, UA 6.5    Protein, UA neg    Urobilinogen, UA 0.2    Nitrite, UA neg    Leukocytes, UA Negative    EKG reviewed by Dr. Tamala Julian: No acute findings.  Assessment and Plan: 34 year old male is here today for chief complaint of testicular pain.  This may be epididymidis.  I will place on abx and schedule for [place] of scrotals.  Diabetes is not well controlled.  I have advised that he increase his exercise regimen both verbally and in hotel.  I have also included food tips.  -We will start [place].  At this time, invokana along, may not be able to drive his blood glucose down alone  - Testicular pain, left - Plan: US Scrotum, GC/Chlamydia Probe Amp, doxycycline (VIBRAMYCIN) 100 MG capsule  Chest pain, unspecified chest pain type - Plan: EKG 12-Lead, POCT CBC, POCT glucose (manual entry), POCT UA - Microscopic Only, POCT urinalysis dipstick, COMPLETE METABOLIC PANEL WITH GFR  Elevated pulse rate - Plan: POCT CBC, COMPLETE METABOLIC PANEL WITH GFR,   Type  2 diabetes mellitus with hyperglycemia - Plan: sitaGLIPtin (JANUVIA) 100 MG tablet  Gastroesophageal reflux disease, esophagitis presence not specified - Plan: ranitidine (ZANTAC) 150 MG tablet  Ivar Drape, PA-C [facility] 5/9/20167:14 PM

## 2014-09-11 NOTE — Patient Instructions (Signed)
Start the doxycycline tonight. Start anti-hypertensive tonight. Start the ranitidine twice per day.  Diabetes and Exercise Exercising regularly is important. It is not just about losing weight. It has many health benefits, such as:  Improving your overall fitness, flexibility, and endurance.  Increasing your bone density.  Helping with weight control.  Decreasing your body fat.  Increasing your muscle strength.  Reducing stress and tension.  Improving your overall health. People with diabetes who exercise gain additional benefits because exercise:  Reduces appetite.  Improves the body's use of blood sugar (glucose).  Helps lower or control blood glucose.  Decreases blood pressure.  Helps control blood lipids (such as cholesterol and triglycerides).  Improves the body's use of the hormone insulin by:  Increasing the body's insulin sensitivity.  Reducing the body's insulin needs.  Decreases the risk for heart disease because exercising:  Lowers cholesterol and triglycerides levels.  Increases the levels of good cholesterol (such as high-density lipoproteins [HDL]) in the body.  Lowers blood glucose levels. YOUR ACTIVITY PLAN  Choose an activity that you enjoy and set realistic goals. Your health care provider or diabetes educator can help you make an activity plan that works for you. Exercise regularly as directed by your health care provider. This includes:  Performing resistance training twice a week such as push-ups, sit-ups, lifting weights, or using resistance bands.  Performing 150 minutes of cardio exercises each week such as walking, running, or playing sports.  Staying active and spending no more than 90 minutes at one time being inactive. Even short bursts of exercise are good for you. Three 10-minute sessions spread throughout the day are just as beneficial as a single 30-minute session. Some exercise ideas include:  Taking the dog for a walk.  Taking  the stairs instead of the elevator.  Dancing to your favorite song.  Doing an exercise video.  Doing your favorite exercise with a friend. RECOMMENDATIONS FOR EXERCISING WITH TYPE 1 OR TYPE 2 DIABETES   Check your blood glucose before exercising. If blood glucose levels are greater than 240 mg/dL, check for urine ketones. Do not exercise if ketones are present.  Avoid injecting insulin into areas of the body that are going to be exercised. For example, avoid injecting insulin into:  The arms when playing tennis.  The legs when jogging.  Keep a record of:  Food intake before and after you exercise.  Expected peak times of insulin action.  Blood glucose levels before and after you exercise.  The type and amount of exercise you have done.  Review your records with your health care provider. Your health care provider will help you to develop guidelines for adjusting food intake and insulin amounts before and after exercising.  If you take insulin or oral hypoglycemic agents, watch for signs and symptoms of hypoglycemia. They include:  Dizziness.  Shaking.  Sweating.  Chills.  Confusion.  Drink plenty of water while you exercise to prevent dehydration or heat stroke. Body water is lost during exercise and must be replaced.  Talk to your health care provider before starting an exercise program to make sure it is safe for you. Remember, almost any type of activity is better than none. Document Released: 07/17/2003 Document Revised: 09/10/2013 Document Reviewed: 10/03/2012 [facility] Patient Information 2015 [place], [place]. This information is not intended to replace advice given to you by your health care provider. Make sure you discuss any questions you have with your health care provider.  Diabetes Mellitus and Food It is  important for you to manage your blood sugar (glucose) level. Your blood glucose level can be greatly affected by what you eat. Eating healthier foods in  the appropriate amounts throughout the day at about the same time each day will help you control your blood glucose level. It can also help slow or prevent worsening of your diabetes mellitus. Healthy eating may even help you improve the level of your blood pressure and reach or maintain a healthy weight.  HOW CAN FOOD AFFECT ME? Carbohydrates Carbohydrates affect your blood glucose level more than any other type of food. Your dietitian will help you determine how many carbohydrates to eat at each meal and teach you how to count carbohydrates. Counting carbohydrates is important to keep your blood glucose at a healthy level, especially if you are using insulin or taking certain medicines for diabetes mellitus. Alcohol Alcohol can cause sudden decreases in blood glucose (hypoglycemia), especially if you use insulin or take certain medicines for diabetes mellitus. Hypoglycemia can be a life-threatening condition. Symptoms of hypoglycemia (sleepiness, dizziness, and disorientation) are similar to symptoms of having too much alcohol.  If your health care provider has given you approval to drink alcohol, do so in moderation and use the following guidelines:  Women should not have more than one drink per day, and men should not have more than two drinks per day. One drink is equal to:  12 oz of beer.  5 oz of wine.  1 oz of hard liquor.  Do not drink on an empty stomach.  Keep yourself hydrated. Have water, diet soda, or unsweetened iced tea.  Regular soda, juice, and other mixers might contain a lot of carbohydrates and should be counted. WHAT FOODS ARE NOT RECOMMENDED? As you make food choices, it is important to remember that all foods are not the same. Some foods have fewer nutrients per serving than other foods, even though they might have the same number of calories or carbohydrates. It is difficult to get your body what it needs when you eat foods with fewer nutrients. Examples of foods that  you should avoid that are high in calories and carbohydrates but low in nutrients include:  Trans fats (most processed foods list trans fats on the Nutrition Facts label).  Regular soda.  Juice.  Candy.  Sweets, such as cake, pie, doughnuts, and cookies.  Fried foods. WHAT FOODS CAN I EAT? Have nutrient-rich foods, which will nourish your body and keep you healthy. The food you should eat also will depend on several factors, including:  The calories you need.  The medicines you take.  Your weight.  Your blood glucose level.  Your blood pressure level.  Your cholesterol level. You also should eat a variety of foods, including:  Protein, such as meat, poultry, fish, tofu, nuts, and seeds (lean animal proteins are best).  Fruits.  Vegetables.  Dairy products, such as milk, cheese, and yogurt (low fat is best).  Breads, grains, pasta, cereal, rice, and beans.  Fats such as olive oil, trans fat-free margarine, canola oil, avocado, and olives. DOES EVERYONE WITH DIABETES MELLITUS HAVE THE SAME MEAL PLAN? Because every person with diabetes mellitus is different, there is not one meal plan that works for everyone. It is very important that you meet with a dietitian who will help you create a meal plan that is just right for you. Document Released: 01/21/2005 Document Revised: 05/01/2013 Document Reviewed: 03/23/2013 [facility] Patient Information 2015 [place], [place]. This information is not intended to  replace advice given to you by your health care provider. Make sure you discuss any questions you have with your health care provider.  

## 2014-09-12 ENCOUNTER — Other Ambulatory Visit: Payer: Self-pay | Admitting: Physician Assistant

## 2014-09-12 ENCOUNTER — Other Ambulatory Visit: Payer: Self-pay | Admitting: Radiology

## 2014-09-12 ENCOUNTER — Telehealth: Payer: Self-pay

## 2014-09-12 DIAGNOSIS — N50812 Left testicular pain: Secondary | ICD-10-CM

## 2014-09-12 LAB — COMPLETE METABOLIC PANEL WITH GFR
ALK PHOS: 105 U/L (ref 39–117)
ALT: 46 U/L (ref 0–53)
AST: 34 U/L (ref 0–37)
Albumin: 4.2 g/dL (ref 3.5–5.2)
BILIRUBIN TOTAL: 0.4 mg/dL (ref 0.2–1.2)
BUN: 10 mg/dL (ref 6–23)
CHLORIDE: 101 meq/L (ref 96–112)
CO2: 25 mEq/L (ref 19–32)
Calcium: 9.2 mg/dL (ref 8.4–10.5)
Creat: 0.85 mg/dL (ref 0.50–1.35)
GFR, Est Non African American: >89 mL/min
Glucose, Bld: 182 mg/dL — ABNORMAL HIGH (ref 70–99)
POTASSIUM: 4.1 meq/L (ref 3.5–5.3)
SODIUM: 138 meq/L (ref 135–145)
TOTAL PROTEIN: 7.3 g/dL (ref 6.0–8.3)

## 2014-09-12 NOTE — Telephone Encounter (Signed)
[facility] is calling stating that we need to add procedure ultrasound arterial flow order ([id number]) added so that the order for ultrasound can be scheduled  Please put the order in and referrals will sending it to [facility]

## 2014-09-12 NOTE — Telephone Encounter (Signed)
Done

## 2014-09-13 LAB — GC/CHLAMYDIA PROBE AMP
CT PROBE, AMP APTIMA: NEGATIVE
GC PROBE AMP APTIMA: NEGATIVE

## 2014-09-16 ENCOUNTER — Ambulatory Visit
Admission: RE | Admit: 2014-09-16 | Discharge: 2014-09-16 | Disposition: A | Discharge status: Home / Self Care | Payer: PRIVATE HEALTH INSURANCE | Source: Ambulatory Visit | Attending: Physician Assistant | Admitting: Physician Assistant

## 2014-09-16 DIAGNOSIS — N50812 Left testicular pain: Secondary | ICD-10-CM

## 2014-09-19 ENCOUNTER — Telehealth: Payer: Self-pay

## 2014-09-19 DIAGNOSIS — N50812 Left testicular pain: Secondary | ICD-10-CM

## 2014-09-19 DIAGNOSIS — E1165 Type 2 diabetes mellitus with hyperglycemia: Secondary | ICD-10-CM

## 2014-09-19 DIAGNOSIS — N433 Hydrocele, unspecified: Secondary | ICD-10-CM

## 2014-09-19 NOTE — Telephone Encounter (Signed)
[name]-- Pt is calling about ultrasound results.

## 2014-09-19 NOTE — Telephone Encounter (Addendum)
PATIENT STATES HE SAW STEPHANIE [facility] A COUPLE OF WEEKS AGO FOR A SWOLLEN TESTICLE. HE WENT TO [facility] ON Monday AND HAD A ULTRASOUND DONE. HE HAS NOT HEARD ANYTHING REGARDING HIS RESULTS. HE WOULD LIKE TO GET THOSE RESULTS PLEASE.  BEST PHONE [phone] (CELL)  HIS PHARMACY CHOICE IF NEEDED IS [facility] ON [address].  [place]

## 2014-09-20 NOTE — Telephone Encounter (Signed)
Spoke with patient of [place] results and lab work yesterday.  Patient reports that his symptoms are significantly improved after taking the abx.   Gonorrhea and chlamydia was negative.  I am referring him to urology.  My suspicion is that this may have been epididymitis and this is an asxatic left hydrocele.  If by the time symptoms are completely resolved, he can cancel the referral.   Patient was able to obtain the invokana.  He states that his blood sugar increased to 348 following drinking a cherry coke.  I have advised him to restrain from sugary carbonated beverages.  He would benefit from a dietician to tailor his diet.  I am sending referral.  Referral to urology for testicular pain and hydrocele. Referral to dietician to address diabetic-friendly diet.

## 2014-12-26 ENCOUNTER — Ambulatory Visit (INDEPENDENT_AMBULATORY_CARE_PROVIDER_SITE_OTHER): Payer: PRIVATE HEALTH INSURANCE | Admitting: Family Medicine

## 2014-12-26 VITALS — BP 136/92 | HR 83 | Temp 98.5°F | Resp 16 | Ht 74.0 in | Wt 339.0 lb

## 2014-12-26 DIAGNOSIS — R300 Dysuria: Secondary | ICD-10-CM | POA: Diagnosis not present

## 2014-12-26 DIAGNOSIS — E1165 Type 2 diabetes mellitus with hyperglycemia: Secondary | ICD-10-CM

## 2014-12-26 DIAGNOSIS — B3742 Candidal balanitis: Secondary | ICD-10-CM

## 2014-12-26 DIAGNOSIS — I10 Essential (primary) hypertension: Secondary | ICD-10-CM | POA: Diagnosis not present

## 2014-12-26 DIAGNOSIS — N481 Balanitis: Secondary | ICD-10-CM

## 2014-12-26 LAB — POCT URINALYSIS DIPSTICK
Blood, UA: NEGATIVE
Glucose, UA: 500
Leukocytes, UA: NEGATIVE
Nitrite, UA: NEGATIVE
Protein, UA: 30
Spec Grav, UA: 1.025
Urobilinogen, UA: 0.2
pH, UA: 6

## 2014-12-26 LAB — POCT UA - MICROSCOPIC ONLY
Bacteria, U Microscopic: NEGATIVE
Casts, Ur, LPF, POC: NEGATIVE
Crystals, Ur, HPF, POC: NEGATIVE
Epithelial cells, urine per micros: NEGATIVE
Mucus, UA: NEGATIVE
WBC, Ur, HPF, POC: NEGATIVE

## 2014-12-26 LAB — COMPLETE METABOLIC PANEL WITH GFR
Albumin: 4.3 g/dL (ref 3.6–5.1)
Alkaline Phosphatase: 83 U/L (ref 40–115)
BUN: 10 mg/dL (ref 7–25)
Creat: 0.76 mg/dL (ref 0.60–1.35)
GFR, Est Non African American: >89 mL/min (ref >=60)
Glucose, Bld: 243 mg/dL — ABNORMAL HIGH (ref 65–99)
Sodium: 134 mmol/L — ABNORMAL LOW (ref 135–146)
Total Bilirubin: 0.5 mg/dL (ref 0.2–1.2)
Total Protein: 7.1 g/dL (ref 6.1–8.1)

## 2014-12-26 LAB — COMPLETE METABOLIC PANEL WITHOUT GFR
ALT: 29 U/L (ref 9–46)
AST: 18 U/L (ref 10–40)
CO2: 25 mmol/L (ref 20–31)
Calcium: 9.4 mg/dL (ref 8.6–10.3)
Chloride: 97 mmol/L — ABNORMAL LOW (ref 98–110)
GFR, Est African American: >89 mL/min (ref >=60)
Potassium: 4 mmol/L (ref 3.5–5.3)

## 2014-12-26 LAB — POCT GLYCOSYLATED HEMOGLOBIN (HGB A1C): Hemoglobin A1C: 11

## 2014-12-26 MED ORDER — FLUCONAZOLE 150 MG PO TABS: 150.0000 mg | ORAL_TABLET | Freq: Every day | ORAL | Status: DC

## 2014-12-26 MED ORDER — NYSTATIN 100000 UNIT/GM EX CREA: 1.0000 "application " | TOPICAL_CREAM | Freq: Two times a day (BID) | CUTANEOUS | Status: DC

## 2014-12-26 NOTE — Progress Notes (Signed)
Chief Complaint:  Chief Complaint  Patient presents with  . yeast infection    penis, pt feels it's from taking Invokana x 3 weeks    HPI: [name] is a 34 y.o. male who reports to [facility] today complaining of penile rash after starting invokana 3-4 weeks ago , has a hx of epiddymitis and also right sided hydrocele, he was given cipro and the cipro might have triggered his yeast infection in addition to him bein gon nvokana. The cipro made him sick and after 5 days he stopped taking it.  He has some pain with uriantion. He has a rash around his penis.  He would like to get his DM rechecked.  Lab Results  Component Value Date   HGBA1C 11.0 12/26/2014   HGBA1C 9.0 07/11/2014   HGBA1C 7.6* 09/20/2011   Lab Results  Component Value Date   MICROALBUR 5.2* 07/11/2014   LDLCALC 138* 03/11/2008   CREATININE 0.76 12/26/2014   Last visit with Dr Brigitte Pulse in 07/2014  HPI Comments: NOX TALENT is a 34 y.o. male with a hx of DM, HTN and PTSD who presents to the [facility] wanting to start on DM and HTN medications again. Patient was at the ER last month and was diagnosed with bronchitis; at that time, he was told his blood sugar and blood pressure were elevated and was recommended to follow-up on that. He had been off of medications for a year; he stopped his medications because he was able to get his DM and HTN under control.  Hypertension: Patient had been on HCTZ and lisinopril in the past for blood pressure. His diastolic blood pressure was still high on those medications, but he notes that his systolic blood pressure went down. He did have urinary frequency as a side effect, but he states it did not bother him too much. Patient does have a blood pressure cuff at home. He has been getting blood pressure readings around 150/100 for the past month. Patient notes that he limits salt intake in his diet, but he does drink caffeine (coffee and soft drinks). He states  he does get plenty of fluids. He has been having HA with varying severity depending on the day. Patient denies CP, SOB and visual changes. He also denies FMHx of HTN.   Diabetes: Patient had been on metformin in the past for about 1 month, but he stopped because it was making him feel sick. The last DM medication he was on Invokana, which he states he did fine on. He denies any UTI while being on the medications. He has not had an A1C check recently. Patient last ate at 8:00 AM this morning.  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 He has been to a therapist/psychiatrist in the past, which he states was helpful. He responded very well to citalopram prior.  No PCP; he was seen at the [facility] in [facility] before he moved  Past Medical History  Diagnosis Date  . Diabetes mellitus   . Hypertension    No past surgical history on file. Social History   Social History  . Marital Status: Married    Spouse Name: [name]  . Number of Children: [name]  . Years of Education: [name]   Social History Main Topics  . Smoking status: Current Some Day Smoker    Types: Cigars  . Smokeless tobacco: None  . Alcohol Use: Yes  . Drug  Use: No  . Sexual Activity: Not Asked   Other Topics Concern  . None   Social History Narrative   No family history on file. Allergies  Allergen Reactions  . Amoxicillin Anaphylaxis  . Other Other (See Comments)    Powder in some gloves - localized itching but not allergic to latex, benadryl usually helps with this reaction   Prior to Admission medications   Medication Sig Start Date End Date Taking? Authorizing Provider  amLODipine (NORVASC) 5 MG tablet Take 1 tablet (5 mg total) by mouth daily. 07/11/14  Yes Shawnee Knapp, MD  aspirin EC 81 MG tablet Take 1 tablet (81 mg total) by mouth daily. 07/11/14  Yes Shawnee Knapp, MD  blood glucose meter kit and supplies KIT Test blood sugar twice a day as directed 07/11/14 07/11/15 Yes Shawnee Knapp, MD  canagliflozin  (INVOKANA) 100 MG TABS tablet Take 1 tablet (100 mg total) by mouth daily. 07/11/14  Yes Shawnee Knapp, MD  citalopram (CELEXA) 40 MG tablet Take 1 tablet (40 mg total) by mouth daily. Start 1/2 tab po qd x 1 wk 07/11/14  Yes Shawnee Knapp, MD  lisinopril (PRINIVIL,ZESTRIL) 40 MG tablet Take 1 tablet (40 mg total) by mouth daily. 07/11/14  Yes Shawnee Knapp, MD  ranitidine (ZANTAC) 150 MG tablet Take 1 tablet (150 mg total) by mouth 2 (two) times daily. 09/11/14  Yes Stephanie D English, PA  sitaGLIPtin (JANUVIA) 100 MG tablet Take 1 tablet (100 mg total) by mouth daily. Patient not taking: Reported on 12/26/2014 09/11/14   Dorian Heckle English, PA     ROS: The patient denies fevers, chills, night sweats, unintentional weight loss, chest pain, palpitations, wheezing, dyspnea on exertion, nausea, vomiting, abdominal pain,  hematuria, melena, numbness, weakness, or tingling.   All other systems have been reviewed and were otherwise negative with the exception of those mentioned in the HPI and as above.    PHYSICAL EXAM: Filed Vitals:   12/26/14 0847  BP: 136/92  Pulse: 83  Temp: 98.5 F (36.9 C)  Resp: 16   Body mass index is 43.51 kg/(m^2).   General: Alert, no acute distress, obese AA male HEENT:  Normocephalic, atraumatic, oropharynx patent. EOMI, PERRLA Cardiovascular:  Regular rate and rhythm, no rubs murmurs or gallops.  No Carotid bruits, radial pulse intact. No pedal edema.  Respiratory: Clear to auscultation bilaterally.  No wheezes, rales, or rhonchi.  No cyanosis, no use of accessory musculature Abdominal: No organomegaly, abdomen is soft and non-tender, positive bowel sounds. No masses. Skin: + balanitis of tip of penis  Neurologic: Facial musculature symmetric. Psychiatric: Patient acts appropriately throughout our interaction. Lymphatic: No cervical or submandibular lymphadenopathy Musculoskeletal: Gait intact. No edema, tenderness   LABS: Results for orders placed or performed in visit  on 12/26/14  COMPLETE METABOLIC PANEL WITH GFR  Result Value Ref Range   Sodium 134 (L) 135 - 146 mmol/L   Potassium 4.0 3.5 - 5.3 mmol/L   Chloride 97 (L) 98 - 110 mmol/L   CO2 25 20 - 31 mmol/L   Glucose, Bld 243 (H) 65 - 99 mg/dL   BUN 10 7 - 25 mg/dL   Creat 0.76 0.60 - 1.35 mg/dL   Total Bilirubin 0.5 0.2 - 1.2 mg/dL   Alkaline Phosphatase 83 40 - 115 U/L   AST 18 10 - 40 U/L   ALT 29 9 - 46 U/L   Total Protein 7.1 6.1 - 8.1 g/dL  Albumin 4.3 3.6 - 5.1 g/dL   Calcium 9.4 8.6 - 10.3 mg/dL   GFR, Est African American >89 >=60 mL/min   GFR, Est Non African American >89 >=60 mL/min  POCT UA - Microscopic Only  Result Value Ref Range   WBC, Ur, HPF, POC Negative    RBC, urine, microscopic 0-1    Bacteria, U Microscopic Negative    Mucus, UA Negative    Epithelial cells, urine per micros Negative    Crystals, Ur, HPF, POC Negative    Casts, Ur, LPF, POC negative    Yeast, UA    POCT urinalysis dipstick  Result Value Ref Range   Color, UA Amber    Clarity, UA Clear    Glucose, UA 500    Bilirubin, UA Small    Ketones, UA Trace    Spec Grav, UA 1.025    Blood, UA Negative    pH, UA 6.0    Protein, UA 30    Urobilinogen, UA 0.2    Nitrite, UA Negative    Leukocytes, UA Negative Negative  POCT glycosylated hemoglobin (Hb A1C)  Result Value Ref Range   Hemoglobin A1C 11.0      EKG/XRAY:   Primary read interpreted by Dr. Marin Comment at [facility]   ASSESSMENT/PLAN: Encounter Diagnoses  Name Primary?  . Type 2 diabetes mellitus with hyperglycemia Yes  . Dysuria   . Candidiasis of penis   . Essential hypertension   . Balanitis    Poorly controlled diabetic on Invokana, here with candida balanitis Rx Diflucan and also nystatin Will get blood work for DM Recommend : ADA diet, BP goal <140/90, daily foot exams, tobacco cessation if smoking, annual eye exam, annual flu vaccine, PNA vaccine if age and time appropriate.     Gross sideeffects, risk and benefits, and  alternatives of medications d/w patient. Patient is aware that all medications have potential sideeffects and we are unable to predict every sideeffect or drug-drug interaction that may occur.  Petronella Shuford DO  12/31/2014 7:59 AM

## 2015-01-01 ENCOUNTER — Telehealth: Payer: Self-pay | Admitting: Family Medicine

## 2015-01-01 NOTE — Telephone Encounter (Signed)
Attempted to call to speak to patient about labs, need to increase invokana or add insulin. Consider retrial of Metformin ER but may not tolerate. HOnestly I think for his body habitus and poor DM control he needs to be on insulin.

## 2015-01-07 ENCOUNTER — Telehealth: Payer: Self-pay | Admitting: Family Medicine

## 2015-01-07 MED ORDER — GLIPIZIDE ER 5 MG PO TB24: 5.0000 mg | ORAL_TABLET | Freq: Two times a day (BID) | ORAL | Status: DC

## 2015-01-07 NOTE — Telephone Encounter (Signed)
Spoke with patient he was on janumet and was having SEs, metformin made hism have diarrhea, nausea, feeling just sick. He was on [place] alone but sto[pped that as well, he was put on invokana dn astarted getting balantitis and yeast infection and so doesnot want to increase the invokana. Iw ould like him tos teart on glipizide 5 mg Er BID andif he tolerates and still need better glycemic control then will retry aJanuvia. He doe snto want to increase invokana or go back to metformin. Recheck in 1=2 months, will email me his blood sugars, hypoglycemia precautiosn given

## 2015-01-10 ENCOUNTER — Encounter: Payer: Self-pay | Admitting: Family Medicine

## 2015-02-15 ENCOUNTER — Other Ambulatory Visit: Payer: Self-pay | Admitting: Family Medicine

## 2015-04-17 ENCOUNTER — Other Ambulatory Visit: Payer: Self-pay | Admitting: Physician Assistant

## 2015-04-17 ENCOUNTER — Other Ambulatory Visit: Payer: Self-pay | Admitting: Family Medicine

## 2015-05-26 ENCOUNTER — Other Ambulatory Visit: Payer: Self-pay | Admitting: Family Medicine

## 2015-05-27 ENCOUNTER — Telehealth: Payer: Self-pay

## 2015-05-27 NOTE — Telephone Encounter (Signed)
I see he needs  an OV for a refill on his citalopram (CELEXA), so I called him back to let him know. He said he would call later to make an appointment.

## 2015-09-02 ENCOUNTER — Ambulatory Visit (INDEPENDENT_AMBULATORY_CARE_PROVIDER_SITE_OTHER): Payer: PRIVATE HEALTH INSURANCE | Admitting: Family Medicine

## 2015-09-02 VITALS — BP 148/92 | HR 123 | Temp 98.1°F | Resp 18 | Ht 75.0 in | Wt 336.0 lb

## 2015-09-02 DIAGNOSIS — M545 Low back pain, unspecified: Secondary | ICD-10-CM

## 2015-09-02 DIAGNOSIS — F39 Unspecified mood [affective] disorder: Secondary | ICD-10-CM

## 2015-09-02 DIAGNOSIS — F341 Dysthymic disorder: Secondary | ICD-10-CM | POA: Diagnosis not present

## 2015-09-02 DIAGNOSIS — G8929 Other chronic pain: Secondary | ICD-10-CM

## 2015-09-02 MED ORDER — OXYCODONE-ACETAMINOPHEN 7.5-325 MG PO TABS: 1.0000 | ORAL_TABLET | Freq: Three times a day (TID) | ORAL | Status: DC | PRN

## 2015-09-02 MED ORDER — TRAZODONE HCL 150 MG PO TABS: 150.0000 mg | ORAL_TABLET | Freq: Every day | ORAL | Status: DC

## 2015-09-02 MED ORDER — PREDNISONE 20 MG PO TABS: ORAL_TABLET | ORAL | Status: DC

## 2015-09-02 NOTE — Patient Instructions (Addendum)
Please let me know by Thursday how you are feeling.  If not improving, then we'll get an MRI and possibly a back specialist.

## 2015-09-02 NOTE — Progress Notes (Signed)
  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 He presents with a long history of intermittent back pain. This particular episode began last week on Thursday night. Patient works 24 hours on and 48 hours off. He really wants to make a career out of the paramedic work since he studies a hard to get his credentials.  Patient now has midline back pain that goes across the posterior superior iliac crests bilaterally. Hurts to stand up and is especially is painful when he has been lying down for a while and he goes to move. He has taken Mobic, diazepam, Robaxin, and Flexeril without any benefit. He also tried Tylenol. He's had to miss a couple shifts of work.  Patient denies fever, radiating pain down his leg, weakness, or numbness.  He also has problems with diabetes. His last A1c was 10.3  Patient also has a problem with chronic dysthymia. Like to try something besides the Celexa for this.  Objective:BP 148/92 mmHg  Pulse 123  Temp(Src) 98.1 F (36.7 C) (Oral)  Resp 18  Ht 6\' 3"  (1.905 m)  Wt 336 lb (152.409 kg)  BMI 42.00 kg/m2  SpO2 98% This is a very large middle-aged man who is moving carefully in the room. Straight-leg raising is negative Reflexes are normal in the knees and ankles Patient shows normal strength but walks very slowly. Back shows no scoliosis or unusual bony landmarks changes and is nontender.   Assessment: Difficult situation with man who engages in heavy lifting at times and has intermittent chronic low back pain.   Bilateral low back pain without sciatica - Plan: predniSONE (DELTASONE) 20 MG tablet, oxyCODONE-acetaminophen (PERCOCET) 7.5-325 MG tablet, Ambulatory referral to Physical Therapy  Episodic mood disorder ([place]) - Plan: traZODone (DESYREL) 150 MG tablet  Plan: At this point we'll try to get him out of acute distress with several days of prednisone. He's to call me if he is not getting better and we will get an MRI. I'll also refer him to  physical therapy.  I've kept him out of work for the next 5 days.   Signed, Robyn Haber M.D.    Please let me know by Thursday how you are feeling.  If not improving, then we'll get an MRI and possibly a back specialist.

## 2015-09-04 ENCOUNTER — Telehealth: Payer: Self-pay

## 2015-09-04 ENCOUNTER — Other Ambulatory Visit: Payer: Self-pay | Admitting: Family Medicine

## 2015-09-04 DIAGNOSIS — M545 Low back pain, unspecified: Secondary | ICD-10-CM

## 2015-09-04 NOTE — Telephone Encounter (Signed)
Pt not doing better.  Can you go ahead and order the MRI and refer to back specialist

## 2015-09-04 NOTE — Telephone Encounter (Signed)
Patient called in to let Dr L know that is back is not getting any better and that he is having a lot of muscle spams, he would like someone to call him back and talk to him about what he should do. His call back number is [phone] he was seen for OV on 09/02/15 for Bilateral low back pain without sciatica.

## 2015-09-08 ENCOUNTER — Telehealth: Payer: Self-pay

## 2015-09-08 NOTE — Telephone Encounter (Signed)
Pt is needing to schedule an mri and his note for work extended out but not sure when he will be able to return back to work -rehab for his back  Starts next week   Patient feels worse than when he talked with dr Joseph Art on Thursday

## 2015-09-08 NOTE — Telephone Encounter (Signed)
Orthopedic should decide this?

## 2015-09-09 NOTE — Telephone Encounter (Signed)
Yes, orthopedics should be in charge now

## 2015-09-11 NOTE — Telephone Encounter (Signed)
SPoke with pt, advised message pt understood.

## 2015-09-28 ENCOUNTER — Other Ambulatory Visit: Payer: Self-pay | Admitting: Physician Assistant

## 2015-10-21 ENCOUNTER — Other Ambulatory Visit: Payer: Self-pay | Admitting: Surgical

## 2015-10-22 ENCOUNTER — Other Ambulatory Visit (HOSPITAL_COMMUNITY): Payer: Self-pay | Admitting: *Deleted

## 2015-10-22 NOTE — Patient Instructions (Addendum)
[name]  10/22/2015   Your procedure is scheduled on: 10-31-15  Report to [facility]  Entrance take [facility]  elevators to 3rd floor to  [place] at 800 AM.  Call this number if you have problems the morning of surgery [phone]   Remember: ONLY 1 PERSON MAY GO WITH YOU TO SHORT STAY TO GET  READY MORNING OF [place].  Do not eat food or drink liquids :After Midnight.     Take these medicines the morning of surgery with A SIP OF WATER: AMLODIPINE (NORVASC), RANITIDINE (ZANTAC), OXYCODONE IF NEEDED, INDOMETHACIN PER DR GIOFFRE DO NOT TAKE ANY DIABETIC MEDICATIONS DAY OF YOUR SURGERY                               You may not have any metal on your body including hair pins and              piercings  Do not wear jewelry, make-up, lotions, powders or perfumes, deodorant             Do not wear nail polish.  Do not shave  48 hours prior to surgery.              Men may shave face and neck.   Do not bring valuables to the hospital. [place].  Contacts, dentures or bridgework may not be worn into surgery.  Leave suitcase in the car. After surgery it may be brought to your room.                  Please read over the following fact sheets you were given: _____________________________________________________________________             [facility] - Preparing for Surgery Before surgery, you can play an important role.  Because skin is not sterile, your skin needs to be as free of germs as possible.  You can reduce the number of germs on your skin by washing with CHG (chlorahexidine gluconate) soap before surgery.  CHG is an antiseptic cleaner which kills germs and bonds with the skin to continue killing germs even after washing. Please DO NOT use if you have an allergy to CHG or antibacterial soaps.  If your skin becomes reddened/irritated stop using the CHG and inform your nurse when you  arrive at Short Stay. Do not shave (including legs and underarms) for at least 48 hours prior to the first CHG shower.  You may shave your face/neck. Please follow these instructions carefully:  1.  Shower with CHG Soap the night before surgery and the  morning of Surgery.  2.  If you choose to wash your hair, wash your hair first as usual with your  normal  shampoo.  3.  After you shampoo, rinse your hair and body thoroughly to remove the  shampoo.                           4.  Use CHG as you would any other liquid soap.  You can apply chg directly  to the skin and wash  Gently with a scrungie or clean washcloth.  5.  Apply the CHG Soap to your body ONLY FROM THE NECK DOWN.   Do not use on face/ open                           Wound or open sores. Avoid contact with eyes, ears mouth and genitals (private parts).                       Wash face,  Genitals (private parts) with your normal soap.             6.  Wash thoroughly, paying special attention to the area where your surgery  will be performed.  7.  Thoroughly rinse your body with warm water from the neck down.  8.  DO NOT shower/wash with your normal soap after using and rinsing off  the CHG Soap.                9.  Pat yourself dry with a clean towel.            10.  Wear clean pajamas.            11.  Place clean sheets on your bed the night of your first shower and do not  sleep with pets. Day of Surgery : Do not apply any lotions/deodorants the morning of surgery.  Please wear clean clothes to the hospital/surgery center.  FAILURE TO FOLLOW THESE INSTRUCTIONS MAY RESULT IN THE CANCELLATION OF YOUR SURGERY PATIENT SIGNATURE_________________________________  NURSE SIGNATURE__________________________________  ________________________________________________________________________   Adam Phenix  An incentive spirometer is a tool that can help keep your lungs clear and active. This tool measures how  well you are filling your lungs with each breath. Taking long deep breaths may help reverse or decrease the chance of developing breathing (pulmonary) problems (especially infection) following:  A long period of time when you are unable to move or be active. BEFORE THE PROCEDURE   If the spirometer includes an indicator to show your best effort, your nurse or respiratory therapist will set it to a desired goal.  If possible, sit up straight or lean slightly forward. Try not to slouch.  Hold the incentive spirometer in an upright position. INSTRUCTIONS FOR USE  1. Sit on the edge of your bed if possible, or sit up as far as you can in bed or on a chair. 2. Hold the incentive spirometer in an upright position. 3. Breathe out normally. 4. Place the mouthpiece in your mouth and seal your lips tightly around it. 5. Breathe in slowly and as deeply as possible, raising the piston or the ball toward the top of the column. 6. Hold your breath for 3-5 seconds or for as long as possible. Allow the piston or ball to fall to the bottom of the column. 7. Remove the mouthpiece from your mouth and breathe out normally. 8. Rest for a few seconds and repeat Steps 1 through 7 at least 10 times every 1-2 hours when you are awake. Take your time and take a few normal breaths between deep breaths. 9. The spirometer may include an indicator to show your best effort. Use the indicator as a goal to work toward during each repetition. 10. After each set of 10 deep breaths, practice coughing to be sure your lungs are clear. If you have an incision (the cut made at the time of surgery),  support your incision when coughing by placing a pillow or rolled up towels firmly against it. Once you are able to get out of bed, walk around indoors and cough well. You may stop using the incentive spirometer when instructed by your caregiver.  RISKS AND COMPLICATIONS  Take your time so you do not get dizzy or light-headed.  If you  are in pain, you may need to take or ask for pain medication before doing incentive spirometry. It is harder to take a deep breath if you are having pain. AFTER USE  Rest and breathe slowly and easily.  It can be helpful to keep track of a log of your progress. Your caregiver can provide you with a simple table to help with this. If you are using the spirometer at home, follow these instructions: [place] IF:   You are having difficultly using the spirometer.  You have trouble using the spirometer as often as instructed.  Your pain medication is not giving enough relief while using the spirometer.  You develop fever of 100.5 F (38.1 C) or higher. SEEK IMMEDIATE MEDICAL CARE IF:   You cough up bloody sputum that had not been present before.  You develop fever of 102 F (38.9 C) or greater.  You develop worsening pain at or near the incision site. MAKE SURE YOU:   Understand these instructions.  Will watch your condition.  Will get help right away if you are not doing well or get worse. Document Released: 09/06/2006 Document Revised: 07/19/2011 Document Reviewed: 11/07/2006 [facility] Patient Information 2014 [place], [place].   ________________________________________________________________________

## 2015-10-23 ENCOUNTER — Telehealth: Payer: Self-pay

## 2015-10-23 ENCOUNTER — Encounter (HOSPITAL_COMMUNITY): Payer: Self-pay

## 2015-10-23 ENCOUNTER — Encounter (HOSPITAL_COMMUNITY)
Admission: RE | Admit: 2015-10-23 | Discharge: 2015-10-23 | Disposition: A | Discharge status: Home / Self Care | Payer: PRIVATE HEALTH INSURANCE | Source: Ambulatory Visit | Attending: Orthopedic Surgery | Admitting: Orthopedic Surgery

## 2015-10-23 ENCOUNTER — Ambulatory Visit (INDEPENDENT_AMBULATORY_CARE_PROVIDER_SITE_OTHER): Payer: PRIVATE HEALTH INSURANCE | Admitting: Family Medicine

## 2015-10-23 ENCOUNTER — Ambulatory Visit (HOSPITAL_COMMUNITY)
Admission: RE | Admit: 2015-10-23 | Discharge: 2015-10-23 | Disposition: A | Discharge status: Home / Self Care | Payer: PRIVATE HEALTH INSURANCE | Source: Ambulatory Visit | Attending: Surgical | Admitting: Surgical

## 2015-10-23 VITALS — BP 138/90 | HR 103 | Temp 98.1°F | Resp 20 | Ht 75.0 in | Wt 322.4 lb

## 2015-10-23 DIAGNOSIS — B379 Candidiasis, unspecified: Secondary | ICD-10-CM

## 2015-10-23 DIAGNOSIS — M545 Low back pain, unspecified: Secondary | ICD-10-CM

## 2015-10-23 DIAGNOSIS — E1165 Type 2 diabetes mellitus with hyperglycemia: Secondary | ICD-10-CM | POA: Diagnosis not present

## 2015-10-23 DIAGNOSIS — E119 Type 2 diabetes mellitus without complications: Secondary | ICD-10-CM | POA: Diagnosis not present

## 2015-10-23 DIAGNOSIS — Z01818 Encounter for other preprocedural examination: Secondary | ICD-10-CM

## 2015-10-23 HISTORY — DX: Scoliosis, unspecified: M41.9

## 2015-10-23 HISTORY — DX: Other intervertebral disc displacement, lumbar region: M51.26

## 2015-10-23 HISTORY — DX: Post-traumatic stress disorder, unspecified: F43.10

## 2015-10-23 HISTORY — DX: Anemia, unspecified: D64.9

## 2015-10-23 LAB — COMPREHENSIVE METABOLIC PANEL
ALT: 41 U/L (ref 17–63)
AST: 34 U/L (ref 15–41)
Albumin: 4.1 g/dL (ref 3.5–5.0)
Alkaline Phosphatase: 88 U/L (ref 38–126)
Anion gap: 9 (ref 5–15)
BUN: 11 mg/dL (ref 6–20)
CO2: 26 mmol/L (ref 22–32)
Calcium: 9 mg/dL (ref 8.9–10.3)
Chloride: 101 mmol/L (ref 101–111)
Creatinine, Ser: 0.74 mg/dL (ref 0.61–1.24)
GFR calc Af Amer: >60 mL/min (ref >60)
GFR calc non Af Amer: >60 mL/min (ref >60)
Glucose, Bld: 294 mg/dL — ABNORMAL HIGH (ref 65–99)
Potassium: 3.5 mmol/L (ref 3.5–5.1)
Sodium: 136 mmol/L (ref 135–145)
Total Bilirubin: 0.6 mg/dL (ref 0.3–1.2)
Total Protein: 7.1 g/dL (ref 6.5–8.1)

## 2015-10-23 LAB — CBC WITH DIFFERENTIAL/PLATELET
Basophils Absolute: 0 10*3/uL (ref 0.0–0.1)
Basophils Relative: 0 %
Eosinophils Absolute: 0.2 10*3/uL (ref 0.0–0.7)
Eosinophils Relative: 2 %
HCT: 39.4 % (ref 39.0–52.0)
Hemoglobin: 13.6 g/dL (ref 13.0–17.0)
Lymphocytes Relative: 21 %
Lymphs Abs: 1.8 10*3/uL (ref 0.7–4.0)
MCH: 26.3 pg (ref 26.0–34.0)
MCHC: 34.5 g/dL (ref 30.0–36.0)
MCV: 76.2 fL — ABNORMAL LOW (ref 78.0–100.0)
Monocytes Absolute: 0.6 10*3/uL (ref 0.1–1.0)
Monocytes Relative: 7 %
Neutro Abs: 6.2 10*3/uL (ref 1.7–7.7)
Neutrophils Relative %: 70 %
Platelets: 290 10*3/uL (ref 150–400)
RBC: 5.17 MIL/uL (ref 4.22–5.81)
RDW: 13.4 % (ref 11.5–15.5)
WBC: 8.9 10*3/uL (ref 4.0–10.5)

## 2015-10-23 LAB — URINALYSIS, ROUTINE W REFLEX MICROSCOPIC
Bilirubin Urine: NEGATIVE
Glucose, UA: >1000 mg/dL — AB
Hgb urine dipstick: NEGATIVE
Ketones, ur: NEGATIVE mg/dL
Leukocytes, UA: NEGATIVE
Nitrite: NEGATIVE
Protein, ur: 30 mg/dL — AB
Specific Gravity, Urine: 1.038 — ABNORMAL HIGH (ref 1.005–1.030)
pH: 6 (ref 5.0–8.0)

## 2015-10-23 LAB — PROTIME-INR
INR: 0.99 (ref 0.00–1.49)
Prothrombin Time: 12.9 seconds (ref 11.6–15.2)

## 2015-10-23 LAB — URINE MICROSCOPIC-ADD ON

## 2015-10-23 LAB — ABO/RH: ABO/RH(D): O POS

## 2015-10-23 LAB — APTT: aPTT: 29 seconds (ref 24–37)

## 2015-10-23 LAB — SURGICAL PCR SCREEN
MRSA, PCR: NEGATIVE
STAPHYLOCOCCUS AUREUS: NEGATIVE

## 2015-10-23 MED ORDER — CHLORTHALIDONE 25 MG PO TABS: 25.0000 mg | ORAL_TABLET | Freq: Every day | ORAL | Status: DC

## 2015-10-23 MED ORDER — EXENATIDE ER 2 MG ~~LOC~~ PEN: 2.0000 | PEN_INJECTOR | SUBCUTANEOUS | Status: DC

## 2015-10-23 MED ORDER — FLUCONAZOLE 150 MG PO TABS: 150.0000 mg | ORAL_TABLET | Freq: Once | ORAL | Status: DC

## 2015-10-23 NOTE — Telephone Encounter (Signed)
Pharm faxed notice that ins will not pay for Bydureon unless pt has first failed metformin (which is reported in OV notes), a sulfonylurea,pioglitazone AND either Trulicity or Victoza. I don't see that pt has tried Actos in the past, so not sure that I can get Trulicity or Victoza covered either, but they look like they are preferred on pt's plan. Do you want to try one of these? Or do you know if pt has failed Actos in the past, or reason it would be contraindicated?

## 2015-10-23 NOTE — Progress Notes (Signed)
Ua, micro and cmet results faxed to dr Gladstone Lighter by epic

## 2015-10-23 NOTE — Telephone Encounter (Signed)
Patient has failed sulfonyurea.  He is at risk for CHF if prescibed Actos.  He is having side effects from SGLT2.  Bydureon is a GLP1 class drug.  What drug in this class is approved?

## 2015-10-23 NOTE — Patient Instructions (Addendum)
Have pharmacy call if new medicines not covered by insurance    IF you received an x-ray today, you will receive an invoice from [facility]. Please contact [facility] at [phone] with questions or concerns regarding your invoice.   IF you received labwork today, you will receive an invoice from [employer]. Please contact [facility] at [phone] with questions or concerns regarding your invoice.   Our billing staff will not be able to assist you with questions regarding bills from these companies.  You will be contacted with the lab results as soon as they are available. The fastest way to get your results is to activate your My Chart account. Instructions are located on the last page of this paperwork. If you have not heard from [place] regarding the results in 2 weeks, please contact this office.

## 2015-10-23 NOTE — Progress Notes (Signed)
35 yo man with chronic lumbar disc problems, scheduled for laminectomy next week with Dr. Gladstone Lighter.  He has been on steroids for the back and the sugars have been running high.  Doesn't like the Invokana because of the polyuria and yeast infections.  He developed diarrhea from Metformin.  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  Currently on indocin 50 mg bid.    Objective: BP 138/90 mmHg  Pulse 103  Temp(Src) 98.1 F (36.7 C) (Oral)  Resp 20  Ht 6\' 3"  (1.905 m)  Wt 322 lb 6.4 oz (146.24 kg)  BMI 40.30 kg/m2  SpO2 99% Lab Results  Component Value Date   HGBA1C 11.0 12/26/2014   Wt Readings from Last 3 Encounters:  10/23/15 322 lb 6.4 oz (146.24 kg)  10/23/15 328 lb (148.78 kg)  09/02/15 336 lb (152.409 kg)   Results for orders placed or performed during the hospital encounter of 10/23/15  APTT  Result Value Ref Range   aPTT 29 24 - 37 seconds  CBC WITH DIFFERENTIAL  Result Value Ref Range   WBC 8.9 4.0 - 10.5 K/uL   RBC 5.17 4.22 - 5.81 MIL/uL   Hemoglobin 13.6 13.0 - 17.0 g/dL   HCT 39.4 39.0 - 52.0 %   MCV 76.2 (L) 78.0 - 100.0 fL   MCH 26.3 26.0 - 34.0 pg   MCHC 34.5 30.0 - 36.0 g/dL   RDW 13.4 11.5 - 15.5 %   Platelets 290 150 - 400 K/uL   Neutrophils Relative % 70 %   Neutro Abs 6.2 1.7 - 7.7 K/uL   Lymphocytes Relative 21 %   Lymphs Abs 1.8 0.7 - 4.0 K/uL   Monocytes Relative 7 %   Monocytes Absolute 0.6 0.1 - 1.0 K/uL   Eosinophils Relative 2 %   Eosinophils Absolute 0.2 0.0 - 0.7 K/uL   Basophils Relative 0 %   Basophils Absolute 0.0 0.0 - 0.1 K/uL  Comprehensive metabolic panel  Result Value Ref Range   Sodium 136 135 - 145 mmol/L   Potassium 3.5 3.5 - 5.1 mmol/L   Chloride 101 101 - 111 mmol/L   CO2 26 22 - 32 mmol/L   Glucose, Bld 294 (H) 65 - 99 mg/dL   BUN 11 6 - 20 mg/dL   Creatinine, Ser 0.74 0.61 - 1.24 mg/dL   Calcium 9.0 8.9 - 10.3 mg/dL   Total Protein 7.1 6.5 - 8.1 g/dL   Albumin 4.1 3.5 - 5.0 g/dL   AST 34 15 - 41 U/L   ALT  41 17 - 63 U/L   Alkaline Phosphatase 88 38 - 126 U/L   Total Bilirubin 0.6 0.3 - 1.2 mg/dL   GFR calc non Af Amer >60 >60 mL/min   GFR calc Af Amer >60 >60 mL/min   Anion gap 9 5 - 15  Protime-INR  Result Value Ref Range   Prothrombin Time 12.9 11.6 - 15.2 seconds   INR 0.99 0.00 - 1.49  Urinalysis, Routine w reflex microscopic (not at [facility])  Result Value Ref Range   Color, Urine YELLOW YELLOW   APPearance CLEAR CLEAR   Specific Gravity, Urine 1.038 (H) 1.005 - 1.030   pH 6.0 5.0 - 8.0   Glucose, UA >1000 (A) NEGATIVE mg/dL   Hgb urine dipstick NEGATIVE NEGATIVE   Bilirubin Urine NEGATIVE NEGATIVE   Ketones, ur NEGATIVE NEGATIVE mg/dL   Protein, ur 30 (A) NEGATIVE mg/dL   Nitrite NEGATIVE NEGATIVE   Leukocytes, UA NEGATIVE  NEGATIVE  Urine microscopic-add on  Result Value Ref Range   Squamous Epithelial / LPF 0-5 (A) NONE SEEN   WBC, UA 0-5 0 - 5 WBC/hpf   RBC / HPF 0-5 0 - 5 RBC/hpf   Bacteria, UA RARE (A) NONE SEEN  Type and screen Order type and screen if day of surgery is less than 15 days from draw of preadmission visit or order morning of surgery if day of surgery is greater than 6 days from preadmission visit.  Result Value Ref Range   ABO/RH(D) O POS    Antibody Screen NEG    Sample Expiration 11/06/2015    Extend sample reason NO TRANSFUSIONS OR PREGNANCY IN THE PAST 3 MONTHS   ABO/Rh  Result Value Ref Range   ABO/RH(D) O POS    Assessment:  Uncontrolled diabetes.  Impending back surgery.    ICD-9-CM ICD-10-CM   1. Monilia infection 112.9 B37.9 fluconazole (DIFLUCAN) 150 MG tablet  2. Bilateral low back pain without sciatica 724.2 M54.5   3. Type 2 diabetes mellitus with hyperglycemia, without long-term current use of insulin (HCC) 250.00 E11.65 Exenatide ER 2 MG PEN   790.29       Signed, Robyn Haber, MD

## 2015-10-23 NOTE — Progress Notes (Signed)
   10/23/15 0937  OBSTRUCTIVE SLEEP APNEA  Have you ever been diagnosed with sleep apnea through a sleep study? No  Do you snore loudly (loud enough to be heard through closed doors)?  1  Do you often feel tired, fatigued, or sleepy during the daytime (such as falling asleep during driving or talking to someone)? 0  Has anyone observed you stop breathing during your sleep? 1  Do you have, or are you being treated for high blood pressure? 1  BMI more than 35 kg/m2? 1  Age > 77 (1-yes) 0  Male Gender (Yes=1) 1  Obstructive Sleep Apnea Score 5  Score 5 or greater  Results sent to PCP

## 2015-10-24 LAB — HEMOGLOBIN A1C
HEMOGLOBIN A1C: 12 % — AB (ref 4.8–5.6)
MEAN PLASMA GLUCOSE: 298 mg/dL

## 2015-10-24 NOTE — Progress Notes (Signed)
Faxed hemaglobin a 1 c results to dr Gladstone Lighter by epic and left message with velvet mcbride to make dr Gladstone Lighter aware of heamglobin a 1 c result and elevated blood pressure at pst of 167/109.

## 2015-10-24 NOTE — Telephone Encounter (Signed)
It looks like Trulicity and Victoza are preferred by pt's ins over Bydureon.

## 2015-10-26 ENCOUNTER — Other Ambulatory Visit: Payer: Self-pay | Admitting: Family Medicine

## 2015-10-26 DIAGNOSIS — E119 Type 2 diabetes mellitus without complications: Secondary | ICD-10-CM

## 2015-10-26 MED ORDER — DULAGLUTIDE 0.75 MG/0.5ML ~~LOC~~ SOAJ: 0.7500 mg | SUBCUTANEOUS | Status: DC

## 2015-10-27 NOTE — Progress Notes (Signed)
I think this is a response for medication management

## 2015-10-28 ENCOUNTER — Telehealth: Payer: Self-pay

## 2015-10-28 NOTE — Progress Notes (Signed)
A Rx for Trulicity was sent in to replace Bydureon that ins would not cover.

## 2015-10-28 NOTE — Telephone Encounter (Signed)
A Rx for Trulicity was sent in. I will call pt when ph system is back up to make sure he got the new Rx and has no questions.

## 2015-10-28 NOTE — Telephone Encounter (Signed)
Msg is for Dr. Carlean Jews, pt got a call from orthopedic surgeon, his A1C is too high to do surgery. Pt needs med refill on diabetic medication for injection and also wants to know what should be done to go forward.  Please advise   [phone]

## 2015-10-28 NOTE — Telephone Encounter (Signed)
See notes under prev phone message about new Rx being sent. Pt needs to start his new medication and let us know how it is working. Will advise pt when our ph system is working again.

## 2015-10-29 MED ORDER — CANAGLIFLOZIN 100 MG PO TABS: 100.0000 mg | ORAL_TABLET | Freq: Every day | ORAL | Status: DC

## 2015-10-29 NOTE — Progress Notes (Signed)
Called and advised pt of new Rx for Trulicity. Pt will call me back if ins requires any type of PA for it. He also req'd a RF of Invokana which I sent in. Advised pt to keep a close watch on his BS and let us know if not improving. Also to come back in a couple of months to recheck A1C unless Dr L instructed him to come back sooner. Pt agreed. He is anxious to get his A1C down so that ortho surgery can be done.

## 2015-10-29 NOTE — Telephone Encounter (Signed)
Called pt. See more notes on 6/20 message.

## 2015-10-29 NOTE — Addendum Note (Signed)
Addended by: Elwyn Reach A on: 10/29/2015 04:35 PM   Modules accepted: Orders

## 2015-10-30 NOTE — Telephone Encounter (Addendum)
Pt called and both Trulicity and Invokana need PAs. I called both savings card companies and they DO require Rxs to be covered by ins in order to work. [employer] and they are faxing PA forms to me. They can not do over the phone or through covermymeds.  Completed both forms and faxed to ins. Pending. I will put savings cards in envelope and leave for pt at 102, and will notify him when receive answer from ins.

## 2015-10-30 NOTE — Telephone Encounter (Signed)
I have spoken to pt. See notes under prev message.

## 2015-10-31 ENCOUNTER — Ambulatory Visit (HOSPITAL_COMMUNITY)
Admission: RE | Admit: 2015-10-31 | Payer: PRIVATE HEALTH INSURANCE | Source: Ambulatory Visit | Admitting: Orthopedic Surgery

## 2015-10-31 ENCOUNTER — Encounter (HOSPITAL_COMMUNITY): Admission: RE | Payer: Self-pay | Source: Ambulatory Visit

## 2015-10-31 LAB — TYPE AND SCREEN
ABO/RH(D): O POS
Antibody Screen: NEGATIVE

## 2015-10-31 SURGERY — LUMBAR LAMINECTOMY/DECOMPRESSION MICRODISCECTOMY 1 LEVEL
Anesthesia: General | Site: Back | Laterality: Left

## 2015-11-04 NOTE — Telephone Encounter (Signed)
[employer] approved prior authorization for Trulicity 0.75mg /0.5 - 12 refills from 10/26/2015 to 10/24/2016

## 2015-11-19 ENCOUNTER — Telehealth: Payer: Self-pay

## 2015-11-19 ENCOUNTER — Ambulatory Visit (INDEPENDENT_AMBULATORY_CARE_PROVIDER_SITE_OTHER): Payer: PRIVATE HEALTH INSURANCE | Admitting: Family Medicine

## 2015-11-19 ENCOUNTER — Other Ambulatory Visit: Payer: Self-pay

## 2015-11-19 VITALS — BP 126/84 | HR 110 | Temp 98.8°F | Resp 18 | Ht 75.0 in | Wt 323.0 lb

## 2015-11-19 DIAGNOSIS — E1142 Type 2 diabetes mellitus with diabetic polyneuropathy: Secondary | ICD-10-CM

## 2015-11-19 LAB — POCT GLYCOSYLATED HEMOGLOBIN (HGB A1C): Hemoglobin A1C: 13.9

## 2015-11-19 MED ORDER — INSULIN DEGLUDEC 100 UNIT/ML ~~LOC~~ SOPN: 10.0000 [IU] | PEN_INJECTOR | Freq: Every day | SUBCUTANEOUS | Status: DC

## 2015-11-19 MED ORDER — INSULIN PEN NEEDLE 31G X 6 MM MISC: 1.0000 | Freq: Every day | Status: DC

## 2015-11-19 MED ORDER — GABAPENTIN 300 MG PO CAPS: 300.0000 mg | ORAL_CAPSULE | Freq: Three times a day (TID) | ORAL | Status: DC

## 2015-11-19 NOTE — Telephone Encounter (Signed)
Insurance is not covering what was prescribed today and bradshaw told patient if this happened he would try lantus  Best number [phone]

## 2015-11-19 NOTE — Patient Instructions (Addendum)
Great to meet you!  Come back in 1 week  We wil work on an endocrinologist appointment for you for an endocrinologist   Start gabapentin 1 pill every night for 1 week, Then add 1 pil  In the morning for 1 week, then try 1 pill three times daily   I have started you on insulin, take 10 units once daily  Diet Recommendations for Diabetes   Starchy (carb) foods include: Bread, rice, pasta, potatoes, corn, crackers, bagels, muffins, all baked goods.   Protein foods include: Meat, fish, poultry, eggs, dairy foods, and beans such as pinto and kidney beans (beans also provide carbohydrate).   1. Eat at least 3 meals and 1-2 snacks per day. Never go more than 4-5 hours while awake without eating.  2. Limit starchy foods to TWO per meal and ONE per snack. ONE portion of a starchy  food is equal to the following:   - ONE slice of bread (or its equivalent, such as half of a hamburger bun).   - 1/2 cup of a "scoopable" starchy food such as potatoes or rice.   - 1 OUNCE (28 grams) of starchy snack foods such as crackers or pretzels (look on label).   - 15 grams of carbohydrate as shown on food label.  3. Both lunch and dinner should include a protein food, a carb food, and vegetables.   - Obtain twice as many veg's as protein or carbohydrate foods for both lunch and dinner.   - Try to keep frozen veg's on hand for a quick vegetable serving.     - Fresh or frozen veg's are best.  4. Breakfast should always include protein.

## 2015-11-19 NOTE — Progress Notes (Addendum)
   HPI  Patient presents today here for follow up DM2  Pt states he has made good improvements as it pertains to DM2 fasting CBG avg 140-150 He is watching his diet closely He is not exercising due to back pain.  He has been told by his orthopedic surgeon he cant have surgery until his A1C is less than 8 and he wouldl ike it re-checked.   He denies hypoglycemia, Not checking post prandials. He would be willing to use insulin for  afew months to gain quick control and try again with oral hypoglycemics.   He has had recent worsening of BL foot numbness, initially just L footed and attributed to back pathology now BL and worse at night.   PMH: Smoking status noted ROS: Per HPI  Objective: BP 126/84 mmHg  Pulse 110  Temp(Src) 98.8 F (37.1 C) (Oral)  Resp 18  Ht 6\' 3"  (1.905 m)  Wt 323 lb (146.512 kg)  BMI 40.37 kg/m2  SpO2 99% Gen: NAD, alert, cooperative with exam HEENT: NCAT CV: RRR, good S1/S2, no murmur Resp: CTABL, no wheezes, non-labored Ext: No edema, warm Neuro: Alert and oriented  Assessment and plan:  # DM2, Diabetic neuropathy Start gabapentin once at night titrate to TID adding  Additional dose per day per week.  expalined it takes 3 months to see full effect of changes in diabetic control but his A1C is orsened new insulin start today, Tresiba 10 units daily  (0.2 units/kg would be 30 units daily so this should be very safe) Understands how to give injections and how to recover from hypoglycemia Discussed log, fasting and 2 hour post prandial F/u 1 week Refer to endocrinology Continue invokana and trulicity  # Chronic back pain Stable, awaiting better glucose control to have surgery.    Orders Placed This Encounter  Procedures  . POCT glycosylated hemoglobin (Hb A1C)    Meds ordered this encounter  Medications  . gabapentin (NEURONTIN) 300 MG capsule    Sig: Take 1 capsule (300 mg total) by mouth 3 (three) times daily.    Dispense:  90 capsule   Refill:  3    Kenn File, MD 11:05 AM

## 2015-11-20 ENCOUNTER — Other Ambulatory Visit: Payer: Self-pay | Admitting: Physician Assistant

## 2015-11-20 MED ORDER — INSULIN GLARGINE 100 UNIT/ML SOLOSTAR PEN: 10.0000 [IU] | PEN_INJECTOR | Freq: Every day | SUBCUTANEOUS | Status: DC

## 2015-11-20 NOTE — Telephone Encounter (Signed)
Tresiba 10 units daily not covered by insurance, please advise if you will change to Lantus, this was the plan given by Dr Wendi Snipes

## 2015-11-20 NOTE — Progress Notes (Signed)
Medication changed to lantus solastar 10 units at 10 pm.  Dr. Wendi Snipes has referred to PT.  Philis Fendt, MS, PA-C 7:18 PM, 11/20/2015

## 2015-11-21 ENCOUNTER — Telehealth: Payer: Self-pay

## 2015-11-21 NOTE — Telephone Encounter (Signed)
I started Lantus 10 units nightly per Sam's note.  Philis Fendt, MS, PA-C 10:32 AM, 11/21/2015

## 2015-11-21 NOTE — Telephone Encounter (Signed)
Patient needs a prescription for test strips sent to [facility] on [address].

## 2015-11-24 ENCOUNTER — Other Ambulatory Visit: Payer: Self-pay

## 2015-11-24 MED ORDER — GLUCOSE BLOOD VI STRP: ORAL_STRIP | Status: DC

## 2015-11-24 NOTE — Telephone Encounter (Signed)
Called patient to see what type of device he is currently using. Ultra Touch mini and another device. Pt. Stated he would call back within an hour with exact names of the machines.

## 2015-11-24 NOTE — Telephone Encounter (Signed)
Rx for test strips called in for patient.

## 2015-11-27 ENCOUNTER — Telehealth: Payer: Self-pay

## 2015-11-27 MED ORDER — CANAGLIFLOZIN 300 MG PO TABS: 300.0000 mg | ORAL_TABLET | Freq: Every day | ORAL | Status: DC

## 2015-11-27 MED ORDER — BLOOD GLUCOSE MONITOR KIT: PACK | Status: DC

## 2015-11-27 NOTE — Telephone Encounter (Signed)
Pt was seen recently and the diabetes meds should have been changed to 300mg  and the pharmacy states that they dont have the change and patient is also needing test strips that will be covered by his insurance   Best number [phone]

## 2015-11-27 NOTE — Telephone Encounter (Signed)
Pt advised.

## 2015-11-27 NOTE — Telephone Encounter (Signed)
I sent in the meter and test strips.

## 2015-11-27 NOTE — Telephone Encounter (Signed)
Spoke with pt, the [name] is supposed be 300 mg according to pt but there was no medications sent to the pharmacy.

## 2015-11-27 NOTE — Telephone Encounter (Signed)
Rx for invokana sent.   Laroy Apple, MD [facility] 11/27/2015, 12:14 PM

## 2015-12-17 ENCOUNTER — Ambulatory Visit (INDEPENDENT_AMBULATORY_CARE_PROVIDER_SITE_OTHER): Payer: PRIVATE HEALTH INSURANCE | Admitting: Endocrinology

## 2015-12-17 ENCOUNTER — Encounter: Payer: Self-pay | Admitting: Endocrinology

## 2015-12-17 VITALS — BP 118/74 | HR 105 | Ht 75.0 in | Wt 327.2 lb

## 2015-12-17 DIAGNOSIS — E1165 Type 2 diabetes mellitus with hyperglycemia: Secondary | ICD-10-CM | POA: Diagnosis not present

## 2015-12-17 MED ORDER — DULAGLUTIDE 1.5 MG/0.5ML ~~LOC~~ SOAJ: SUBCUTANEOUS | 1 refills | Status: DC

## 2015-12-17 NOTE — Progress Notes (Signed)
Patient ID: [name], male   DOB: 07-09-1980, 35 y.o.   MRN: [id number]            Reason for Appointment: Consultation for Type 2 Diabetes  Referring physician: Lauenstein   History of Present Illness:          Date of diagnosis of type 2 diabetes mellitus: 2008       Background history:   He has been on various medication regimens for his diabetes over the last several years His blood sugars have been progressively more difficult to control over the last 3-4 years and his lowest A1c was 7.6 in 2013 However he has been somewhat sporadic with his diabetes follow-up over the last few years He apparently has been intolerant to metformin because of diarrhea In 2016 he was tried on Invokana because of poor control and this has been continued  Recent history:   INSULIN regimen is:  Lantus 10 units in ams     Non-insulin hypoglycemic drugs the patient is taking are: Invokana 643 mg daily, Trulicity 3.29 mg weekly.  Current management, blood sugar patterns and problems identified:  His blood sugars have been poorly controlled since at least 2016 and before starting insulin in 7/17 readings at home were over 300.  He was also symptomatic with increased thirst and fatigue   His insulin dose has not been changed since starting 10 units in the morning on 11/20/15  He is checking his blood sugars fairly regularly up to 5 times a day   His lowest blood sugars are generally late morning and sometimes early evening and they are relatively better in the last week or so  He has no side effects from Trulicity but has not increased the dose from the 0.75 that was started           Side effects from medications have been: Diarrhea from metformin  Compliance with the medical regimen: Inconsistent  Glucose monitoring:  done  times a day         Glucometer:  Freestyle       Blood Glucose readings by time of day and averages from meter download:  PREMEAL Breakfast Lunch Dinner Bedtime   Overall   Glucose range: 170-263    187-319  99-319   Median: 221   188  205  222  207   Self-care: The diet that the patient has been following is: tries to limit Fast food .     Meal times are: Dinner: 10 pm           Dietician visit, most recent: at [facility]               Exercise:  none  Weight history:  Wt Readings from Last 3 Encounters:  12/17/15 (!) 327 lb 4 oz (148.4 kg)  11/19/15 (!) 323 lb (146.5 kg)  10/23/15 (!) 322 lb 6.4 oz (146.2 kg)    Glycemic control:   Lab Results  Component Value Date   HGBA1C 13.9 11/19/2015   HGBA1C 12.0 (H) 10/23/2015   HGBA1C 11.0 12/26/2014   Lab Results  Component Value Date   MICROALBUR 5.2 (H) 07/11/2014   LDLCALC 138 (H) 03/11/2008   CREATININE 0.74 10/23/2015   Lab Results  Component Value Date   MICRALBCREAT 4.5 04/26/2008         Medication List       Accurate as of 12/17/15 12:51 PM. Always use your most recent med list.  aspirin EC 81 MG tablet Take 1 tablet (81 mg total) by mouth daily.   blood glucose meter kit and supplies Kit Dispense based on insurance preference. Use up to four times daily as directed. DX: E11.42   canagliflozin 300 MG Tabs tablet Commonly known as:  INVOKANA Take 1 tablet (300 mg total) by mouth daily.   chlorthalidone 25 MG tablet Commonly known as:  HYGROTON Take 1 tablet (25 mg total) by mouth daily.   Dulaglutide 1.5 MG/0.5ML Sopn Commonly known as:  TRULICITY Inject weekly   fluconazole 150 MG tablet Commonly known as:  DIFLUCAN Take 1 tablet (150 mg total) by mouth once. May repeat every two weeks   gabapentin 300 MG capsule Commonly known as:  NEURONTIN Take 1 capsule (300 mg total) by mouth 3 (three) times daily.   glucose blood test strip Use as instructed   HYDROmorphone 2 MG tablet Commonly known as:  DILAUDID   Insulin Glargine 100 UNIT/ML Solostar Pen Commonly known as:  LANTUS SOLOSTAR Inject 10 Units into the skin daily at 10 pm.   Insulin  Pen Needle 31G X 6 MM Misc 1 Device by Does not apply route daily.   ranitidine 150 MG tablet Commonly known as:  ZANTAC Take 1 tablet (150 mg total) by mouth 2 (two) times daily.   traZODone 150 MG tablet Commonly known as:  DESYREL Take 150 mg by mouth at bedtime.       Allergies:  Allergies  Allergen Reactions  . Amoxicillin Anaphylaxis and Other (See Comments)    Has patient had a PCN reaction causing immediate rash, facial/tongue/throat swelling, SOB or lightheadedness with hypotension: yes Has patient had a PCN reaction causing severe rash involving mucus membranes or skin necrosis: no Has patient had a PCN reaction that required hospitalization yes Has patient had a PCN reaction occurring within the last 10 years: yes If all of the above answers are "NO", then may proceed with Cephalosporin use.  [name] Penicillins Anaphylaxis and Other (See Comments)    Patient was told to list this as an allergy because he is allergic to Amoxicillin Has patient had a PCN reaction causing immediate rash, facial/tongue/throat swelling, SOB or lightheadedness with hypotension: yes for Amoxicillin Has patient had a PCN reaction causing severe rash involving mucus membranes or skin necrosis: no Has patient had a PCN reaction that required hospitalization yes for Amoxicillin Has patient had a PCN reaction occurring within the last 10 years: yes for Amoxicillin If all o  . Other Other (See Comments)    Powder in some gloves - localized itching but not allergic to latex, benadryl usually helps with this reaction    Past Medical History:  Diagnosis Date  . Anemia   . Diabetes mellitus   . Hypertension   . Lumbar disc herniation   . PTSD (post-traumatic stress disorder)   . Scoliosis     Past Surgical History:  Procedure Laterality Date  . NO PAST SURGERIES      History reviewed. No pertinent family history.  Social History:  reports that he has been smoking Cigars.  His smokeless tobacco  use includes Snuff. He reports that he drinks alcohol. He reports that he does not use drugs.   Review of Systems  Constitutional: Positive for weight loss. Negative for malaise.  Eyes: Negative for blurred vision.  Respiratory: Negative for shortness of breath.   Cardiovascular: Negative for leg swelling.  Gastrointestinal: Negative for nausea.  Endocrine: Negative for polydipsia and erectile dysfunction.  Genitourinary: Positive for nocturia.  Musculoskeletal: Positive for back pain.  Skin: Negative for itching.  Neurological: Positive for tingling.  Psychiatric/Behavioral: Negative for insomnia.     Lipid history: Has not had any recent evaluation    Lab Results  Component Value Date   CHOL 190 03/11/2008   HDL 27.6 (L) 03/11/2008   LDLCALC 138 (H) 03/11/2008   TRIG 120 03/11/2008   CHOLHDL 6.9 CALC 03/11/2008           Hypertension: present, Well controlled on chlorthalidone alone, previously on lisinopril, not clear why this was stopped  Most recent eye exam was 1/17  Most recent foot exam: 8/17    LABS:  No visits with results within 1 Week(s) from this visit.  Latest known visit with results is:  Office Visit on 11/19/2015  Component Date Value Ref Range Status  . Hemoglobin A1C 11/19/2015 13.9   Final    Physical Examination:  BP 118/74   Pulse (!) 105   Ht _0  (1.905 m)   Wt (!) 327 lb 4 oz (148.4 kg)   SpO2 97%   BMI 40.90 kg/m   GENERAL:         Patient has generalized obesity.   HEENT:         Eye exam shows normal external appearance. Fundus exam shows no retinopathy. Oral exam shows normal mucosa .  NECK:   There is no lymphadenopathy Thyroid is not enlarged and no nodules felt.  Carotids are normal to palpation and no bruit heard LUNGS:         Chest is symmetrical. Lungs are clear to auscultation.[name]   HEART:         Heart sounds:  S1 and S2 are normal. No murmur or click heard., no S3 or S4.   ABDOMEN:   There is no distention present.  Liver and spleen are not palpable. No other mass or tenderness present.   NEUROLOGICAL:   Ankle jerks are absent bilaterally.    Diabetic Foot Exam - Simple   Simple Foot Form Diabetic Foot exam was performed with the following findings:  Yes   Visual Inspection No deformities, no ulcerations, no other skin breakdown bilaterally:  Yes Sensation Testing Intact to touch and monofilament testing bilaterally:  Yes See comments:  Yes Pulse Check Posterior Tibialis and Dorsalis pulse intact bilaterally:  Yes Comments Decreased sensation left first toe            Vibration sense is mildly reduced in distal first toes. MUSCULOSKELETAL:  There is no swelling or deformity of the peripheral joints. Spine is normal to inspection.   EXTREMITIES:     There is no edema. No skin lesions present.[name] SKIN:  He has marked acanthosis of his neck         ASSESSMENT:  Diabetes type 2, uncontrolled With BMI 41    He has had marked hyperglycemia with last A1c almost 14% He has also had progressively worsening diabetes control at least for a year He has had minimal diabetes education He has not been able to exercise this summer because of his lumbar disc problem Currently blood sugars are averaging about 200 with his regimen of low-dose insulin, Trulicity 6.75 mg, Invokana; he is reportedly intolerant to metformin  Discussed that he needs more aggressive for insulin treatment for treating hyperglycemia and glucose toxicity Also does need weight loss  Complications of diabetes:Symptomatic neuropathy, no objective findings, reportedly no retinopathy, needs updated microalbumin  History of hyperlipidemia:  Needs follow-up  PLAN:    Start increasing insulin for improved control  For now he will start taking LANTUS twice a day starting with 10 units twice a day  He was given a flowsheet to adjust his basal insulin by 2 units every 3 days until morning blood sugars are below 130 Most likely can switch  him to [facility] after this prescription is  out, he will check coverage  He will also increase his TRULICITY to 1.5 mg weekly, hopefully this should help control his postprandial reading.  Discussed that he can inject this in his abdomen also  May consider adding a mealtime insulin for his main meal if needed  Consultation with the dietitian  Discussed timing of blood sugar monitoring  Discussed blood sugar targets before and after meals   Consider adding Actos for insulin resistance syndrome Follow-up in 3 weeks, will not be able to do A1c for at least another 6 weeks and blood sugars are recently markedly increased  Patient Instructions  LANTUS insulin: This insulin provides blood sugar control for up to 24 hours.    Start with 10 units at twice daily and increase by 2 units every 3 days until the waking up sugars are under 130. Then continue the same dose. If blood sugar is under 90 for 2 days in a row, reduce the dose by 2 units.  Note that this insulin does not control the rise of blood sugar with meals    Check blood sugars on waking up  daily  Also check blood sugars about 2 hours after a meal and do this after different meals by rotation  Recommended blood sugar levels on waking up is 90-130 and about 2 hours after meal is 130-160  Please bring your blood sugar monitor to each visit, thank you  Trulicity 1.5 mg weekly      Counseling time on subjects discussed above is over 50% of today's 60 minute visit  Delissa Silba 12/17/2015, 12:51 PM   Note: This office note was prepared with Estate agent. Any transcriptional errors that result from this process are unintentional.

## 2015-12-17 NOTE — Patient Instructions (Addendum)
LANTUS insulin: This insulin provides blood sugar control for up to 24 hours.    Start with 10 units at twice daily and increase by 2 units every 3 days until the waking up sugars are under 130. Then continue the same dose. If blood sugar is under 90 for 2 days in a row, reduce the dose by 2 units.  Note that this insulin does not control the rise of blood sugar with meals    Check blood sugars on waking up  daily  Also check blood sugars about 2 hours after a meal and do this after different meals by rotation  Recommended blood sugar levels on waking up is 90-130 and about 2 hours after meal is 130-160  Please bring your blood sugar monitor to each visit, thank you  Trulicity 1.5 mg weekly

## 2015-12-31 ENCOUNTER — Other Ambulatory Visit: Payer: Self-pay | Admitting: Physician Assistant

## 2016-01-02 ENCOUNTER — Telehealth: Payer: Self-pay | Admitting: Endocrinology

## 2016-01-02 NOTE — Telephone Encounter (Signed)
See message and please advise, Thanks!  

## 2016-01-02 NOTE — Telephone Encounter (Signed)
There is no inexpensive options for Lantus, Trulicity or Invokana.  He may still be able to get Invokana with his co-pay card.  He will need to change Lantus to the [facility] insulin which is Relion Novolin 70/30, 10 units before breakfast and supper is available without prescription

## 2016-01-02 NOTE — Telephone Encounter (Signed)
PT stated that he just found out he does not have health insurance and cannot afford any of his insulin or medications, he wants to know what he can do with his limited income. CB # [phone]

## 2016-01-05 NOTE — Telephone Encounter (Signed)
Attempted to reach the patient. Patient unavailable and voicemail is not set up at this time. Will try again to reach the pt at a later time.

## 2016-01-08 ENCOUNTER — Other Ambulatory Visit: Payer: Self-pay | Admitting: Physician Assistant

## 2016-01-08 ENCOUNTER — Other Ambulatory Visit: Payer: Self-pay | Admitting: Family Medicine

## 2016-01-09 NOTE — Telephone Encounter (Signed)
Attempted to reach the patient. Patient unavailable and voicemail is not set up at this time. Will try again to reach the pt at a later time.

## 2016-01-13 ENCOUNTER — Telehealth: Payer: Self-pay | Admitting: Endocrinology

## 2016-01-19 ENCOUNTER — Ambulatory Visit (INDEPENDENT_AMBULATORY_CARE_PROVIDER_SITE_OTHER): Payer: BLUE CROSS/BLUE SHIELD | Admitting: Physician Assistant

## 2016-01-19 ENCOUNTER — Ambulatory Visit: Payer: PRIVATE HEALTH INSURANCE | Admitting: Endocrinology

## 2016-01-19 VITALS — BP 140/92 | HR 92 | Temp 98.2°F | Resp 17 | Ht 75.0 in | Wt 334.0 lb

## 2016-01-19 DIAGNOSIS — J069 Acute upper respiratory infection, unspecified: Secondary | ICD-10-CM

## 2016-01-19 MED ORDER — AZITHROMYCIN 250 MG PO TABS: ORAL_TABLET | ORAL | 0 refills | Status: DC

## 2016-01-19 MED ORDER — CETIRIZINE-PSEUDOEPHEDRINE ER 5-120 MG PO TB12: 1.0000 | ORAL_TABLET | Freq: Two times a day (BID) | ORAL | 0 refills | Status: DC

## 2016-01-19 NOTE — Progress Notes (Signed)
   01/19/2016 3:34 PM   DOB: 08/24/1980 / MRN: [id number]  SUBJECTIVE:  [name] is a 35 y.o. male presenting for a "sinus infection" that started about one week ago and is worsening.  He denies fever.  He has tried OTC pseudoephedrine wihtout relief.  Reports right upper teeth pain and describes this as a pressure. Denies any eye symptoms.  Does have some right ear pain.   Has sneezing.  No eye itching.  Is having some throat itching.    He is allergic to amoxicillin; penicillins; and other.   He  has a past medical history of Anemia; Diabetes mellitus; Hypertension; Lumbar disc herniation; PTSD (post-traumatic stress disorder); and Scoliosis.    He  reports that he has been smoking Cigars.  His smokeless tobacco use includes Snuff. He reports that he drinks alcohol. He reports that he does not use drugs. He  has no sexual activity history on file. The patient  has a past surgical history that includes No past surgeries.  He has a history of DM2.   ROS  Per HPI.  The problem list and medications were reviewed and updated by myself where necessary and exist elsewhere in the encounter.   OBJECTIVE:  BP (!) 140/92 (BP Location: Right Arm, Patient Position: Sitting, Cuff Size: Large)   Pulse 92   Temp 98.2 F (36.8 C) (Oral)   Resp 17   Ht 6\' 3"  (1.905 m)   Wt (!) 334 lb (151.5 kg)   SpO2 99%   BMI 41.75 kg/m   Physical Exam  Constitutional: He appears well-developed and well-nourished. No distress.  HENT:  Right Ear: Tympanic membrane normal.  Left Ear: Tympanic membrane normal.  Nose: Nose normal.  Mouth/Throat: Uvula is midline, oropharynx is clear and moist and mucous membranes are normal.  Cardiovascular: Normal rate and regular rhythm.   Pulmonary/Chest: Effort normal and breath sounds normal.  Lymphadenopathy:       Head (right side): No tonsillar adenopathy present.       Head (left side): No tonsillar adenopathy present.    He has no cervical adenopathy.    Skin: He is not diaphoretic.    No results found for this or any previous visit (from the past 72 hour(s)).  No results found.  ASSESSMENT AND PLAN  [name] was seen today for sinus problem and medication refill.  Diagnoses and all orders for this visit:  URI, acute: Allergic rhinitis versus bacterial sinusitis versus somewhat protracted viral uri. Advised he hold the abx until day 10-12, or start if his symptoms worsen with Zyrtec D.  -     azithromycin (ZITHROMAX) 250 MG tablet; Do not fill if Zyrtec D is helping your symptoms. -     cetirizine-pseudoephedrine (ZYRTEC-D ALLERGY & CONGESTION) 5-120 MG tablet; Take 1 tablet by mouth 2 (two) times daily.    The patient is advised to call or return to clinic if he does not see an improvement in symptoms, or to seek the care of the closest emergency department if he worsens with the above plan.   Philis Fendt, MHS, PA-C [facility] 01/19/2016 3:34 PM

## 2016-01-19 NOTE — Patient Instructions (Signed)
     IF you received an x-ray today, you will receive an invoice from [facility]. Please contact [facility] at [phone] with questions or concerns regarding your invoice.   IF you received labwork today, you will receive an invoice from [facility]. Please contact [facility] at [phone] with questions or concerns regarding your invoice.   Our billing staff will not be able to assist you with questions regarding bills from these companies.  You will be contacted with the lab results as soon as they are available. The fastest way to get your results is to activate your My Chart account. Instructions are located on the last page of this paperwork. If you have not heard from us regarding the results in 2 weeks, please contact this office.      

## 2016-01-21 ENCOUNTER — Other Ambulatory Visit: Payer: Self-pay | Admitting: *Deleted

## 2016-01-21 ENCOUNTER — Encounter: Payer: Self-pay | Admitting: Endocrinology

## 2016-01-21 ENCOUNTER — Ambulatory Visit (INDEPENDENT_AMBULATORY_CARE_PROVIDER_SITE_OTHER): Payer: PRIVATE HEALTH INSURANCE | Admitting: Endocrinology

## 2016-01-21 VITALS — BP 140/88 | HR 106 | Ht 75.0 in | Wt 328.0 lb

## 2016-01-21 DIAGNOSIS — Z794 Long term (current) use of insulin: Secondary | ICD-10-CM

## 2016-01-21 DIAGNOSIS — E1165 Type 2 diabetes mellitus with hyperglycemia: Secondary | ICD-10-CM

## 2016-01-21 MED ORDER — BAYER MICROLET LANCETS MISC: 2 refills | Status: DC

## 2016-01-21 MED ORDER — METFORMIN HCL ER 500 MG PO TB24: 2000.0000 mg | ORAL_TABLET | Freq: Every day | ORAL | 3 refills | Status: DC

## 2016-01-21 MED ORDER — INSULIN GLARGINE 100 UNIT/ML SOLOSTAR PEN: PEN_INJECTOR | SUBCUTANEOUS | 2 refills | Status: DC

## 2016-01-21 MED ORDER — BAYER CONTOUR NEXT EZ W/DEVICE KIT: PACK | 0 refills | Status: DC

## 2016-01-21 MED ORDER — GLUCOSE BLOOD VI STRP: ORAL_STRIP | 2 refills | Status: DC

## 2016-01-21 NOTE — Progress Notes (Signed)
Patient ID: [name], male   DOB: 02-Jun-1980, 35 y.o.   MRN: [id number]            Reason for Appointment: Follow-up for Type 2 Diabetes  Referring physician: Lauenstein   History of Present Illness:          Date of diagnosis of type 2 diabetes mellitus: 2008       Background history:   He has been on various medication regimens for his diabetes over the last several years His blood sugars have been progressively more difficult to control over the last 3-4 years and his lowest A1c was 7.6 in 2013 However he has been somewhat sporadic with his diabetes follow-up over the last few years He apparently has been intolerant to metformin because of diarrhea In 2016 he was tried on Invokana because of poor control and this has been continued  Recent history:   INSULIN regimen is:  Lantus 60 units bid    Non-insulin hypoglycemic drugs the patient is taking are: Invokana 300 mg daily for 11 days  Current management, blood sugar patterns and problems identified:  His blood sugars have been poorly controlled since at least 2016 and before starting insulin in 7/17 readings at home were over 300.  He was not able to get his medications for the month of August because of lack of insurance and his blood sugars more recently have been higher  He was however able to take Trulicity 6.56 mg  His blood sugars appear to be relatively better in mid-August but he has monitored blood sugars sporadically  Has only restarted checking his blood sugars in the last 3 days this month  INSULIN doses: He was started on 10 units twice a day of Lantus but he has sharply escalated his dose up to 60 units and mostly taking this in the last 10-12 days  No recent labs available to objectively assess his control  He has not been able to exercise  Has not had any diabetes education recently  Recently has significant high fasting readings despite taking large doses of Lantus         Side effects from  medications have been: Diarrhea from metformin  Compliance with the medical regimen: Inconsistent  Glucose monitoring:  done  times a day         Glucometer:  Freestyle       Blood Glucose readings by time of day and averages from meter download:  Mean values apply above for all meters except median for One Touch  PRE-MEAL Fasting Lunch Dinner Bedtime Overall  Glucose range: 151-269  1 33-222   117-233    Mean/median:     190    Self-care: The diet that the patient has been following is: tries to limit Fast food .     Meal times are: Dinner: 10 pm           Dietician visit, most recent: at [facility]               Exercise:  none  Weight history:  Wt Readings from Last 3 Encounters:  01/21/16 (!) 328 lb (148.8 kg)  01/19/16 (!) 334 lb (151.5 kg)  12/17/15 (!) 327 lb 4 oz (148.4 kg)    Glycemic control:   Lab Results  Component Value Date   HGBA1C 13.9 11/19/2015   HGBA1C 12.0 (H) 10/23/2015   HGBA1C 11.0 12/26/2014   Lab Results  Component Value Date   MICROALBUR 5.2 (H) 07/11/2014  LDLCALC 138 (H) 03/11/2008   CREATININE 0.74 10/23/2015   Lab Results  Component Value Date   MICRALBCREAT 4.5 04/26/2008         Medication List       Accurate as of 01/21/16  9:48 AM. Always use your most recent med list.          aspirin EC 81 MG tablet Take 1 tablet (81 mg total) by mouth daily.   azithromycin 250 MG tablet Commonly known as:  ZITHROMAX Do not fill if Zyrtec D is helping your symptoms.   B-D UF III MINI PEN NEEDLES 31G X 5 MM Misc Generic drug:  Insulin Pen Needle USE AS DIRECTED EVERY DAY   blood glucose meter kit and supplies Kit Dispense based on insurance preference. Use up to four times daily as directed. DX: E11.42   canagliflozin 300 MG Tabs tablet Commonly known as:  INVOKANA Take 1 tablet (300 mg total) by mouth daily.   cetirizine-pseudoephedrine 5-120 MG tablet Commonly known as:  ZYRTEC-D ALLERGY & CONGESTION Take 1 tablet by mouth 2  (two) times daily.   chlorthalidone 25 MG tablet Commonly known as:  HYGROTON Take 1 tablet (25 mg total) by mouth daily.   Dulaglutide 1.5 MG/0.5ML Sopn Commonly known as:  TRULICITY Inject weekly   fluconazole 150 MG tablet Commonly known as:  DIFLUCAN Take 1 tablet (150 mg total) by mouth once. May repeat every two weeks   gabapentin 300 MG capsule Commonly known as:  NEURONTIN Take 1 capsule (300 mg total) by mouth 3 (three) times daily.   glucose blood test strip Use as instructed   HYDROmorphone 2 MG tablet Commonly known as:  DILAUDID   LANTUS SOLOSTAR 100 UNIT/ML Solostar Pen Generic drug:  Insulin Glargine INJECT 10 UNITS INTO THE SKIN DAILY AT 10 PM.   ranitidine 150 MG tablet Commonly known as:  ZANTAC Take 1 tablet (150 mg total) by mouth 2 (two) times daily.   traZODone 150 MG tablet Commonly known as:  DESYREL Take 150 mg by mouth at bedtime.       Allergies:  Allergies  Allergen Reactions  . Amoxicillin Anaphylaxis and Other (See Comments)    Has patient had a PCN reaction causing immediate rash, facial/tongue/throat swelling, SOB or lightheadedness with hypotension: yes Has patient had a PCN reaction causing severe rash involving mucus membranes or skin necrosis: no Has patient had a PCN reaction that required hospitalization yes Has patient had a PCN reaction occurring within the last 10 years: yes If all of the above answers are "NO", then may proceed with Cephalosporin use.  [name] Penicillins Anaphylaxis and Other (See Comments)    Patient was told to list this as an allergy because he is allergic to Amoxicillin Has patient had a PCN reaction causing immediate rash, facial/tongue/throat swelling, SOB or lightheadedness with hypotension: yes for Amoxicillin Has patient had a PCN reaction causing severe rash involving mucus membranes or skin necrosis: no Has patient had a PCN reaction that required hospitalization yes for Amoxicillin Has patient had a  PCN reaction occurring within the last 10 years: yes for Amoxicillin If all o  . Other Other (See Comments)    Powder in some gloves - localized itching but not allergic to latex, benadryl usually helps with this reaction    Past Medical History:  Diagnosis Date  . Anemia   . Diabetes mellitus   . Hypertension   . Lumbar disc herniation   . PTSD (post-traumatic stress disorder)   .  Scoliosis     Past Surgical History:  Procedure Laterality Date  . NO PAST SURGERIES      No family history on file.  Social History:  reports that he has been smoking Cigars.  His smokeless tobacco use includes Snuff. He reports that he drinks alcohol. He reports that he does not use drugs.   Review of Systems   Lipid history: Has not had any recent evaluation    Lab Results  Component Value Date   CHOL 190 03/11/2008   HDL 27.6 (L) 03/11/2008   LDLCALC 138 (H) 03/11/2008   TRIG 120 03/11/2008   CHOLHDL 6.9 CALC 03/11/2008           Hypertension: present, Is on on chlorthalidone alone from PCP, previously on lisinopril, not clear why this was stopped  Most recent eye exam was 1/17  Most recent foot exam: 8/17    LABS:  No visits with results within 1 Week(s) from this visit.  Latest known visit with results is:  Office Visit on 11/19/2015  Component Date Value Ref Range Status  . Hemoglobin A1C 11/19/2015 13.9   Final    Physical Examination:  BP 140/88   Pulse (!) 106   Ht _0  (1.905 m)   Wt (!) 328 lb (148.8 kg)   SpO2 98%   BMI 41.00 kg/m       ASSESSMENT:  Diabetes type 2, uncontrolled With BMI 41 and history of neuropathy   See history of present illness for detailed discussion of current diabetes management, blood sugar patterns and problems identified  His blood sugars are generally better with using Trulicity and insulin although more recently blood sugars are surprisingly very high with fastings over 200 despite taking 120 units of insulin Although he  had previously reported diarrhea with metformin not clear if this was metformin ER and this would be useful in his control  History of hyperlipidemia: Needs follow-up  Hypertension: Blood pressure relatively high today  PLAN:    Start metformin ER and increase dose gradually as tolerated, explained how this would help his diabetes control  Increase Trulicity to 1.5 mg  Continue Invokana  No change in insulin as yet but may need to reduce this when blood sugars are below 100  Consultation with diabetes educator and dietitian  Discussed checking blood sugars regularly at various times and discussed blood sugar targets  Patient Instructions  Check blood sugars on waking up 4-5x per week  Also check blood sugars about 2 hours after a meal and do this after different meals by rotation  Recommended blood sugar levels on waking up is 90-130 and about 2 hours after meal is 130-160  Please bring your blood sugar monitor to each visit, thank you  Check coverage for Contour  REFILL Trulicity  Start taking Metformin 500 mg, 1 tablet with your main meal for 5 days.  Occasionally this may initially cause loose stools or nausea. If  tolerating well after 5-7 days add a second Metformin tablet (500 mg) at the same time.  Continue adding another tablet after 5 days days if no persistent nausea or diarrhea until reaching the maximum tolerated dose or the full dose of 4 tablets once daily.  Lantus: reduce 5 units in am if supper sugar <100    Counseling time on subjects discussed above is over 50% of today's 25 minute visit  Cyree Chuong 01/21/2016, 9:48 AM   Note: This office note was prepared with Estate agent.  Any transcriptional errors that result from this process are unintentional.   

## 2016-01-21 NOTE — Telephone Encounter (Signed)
Patient stated pharmacy said they need a prescription for the Tresiba ans the Contour Choice meter.  [facility] #[id number] [address], [place] [phone] (Phone) [phone] (Fax)

## 2016-01-21 NOTE — Telephone Encounter (Signed)
Rxs sent

## 2016-01-21 NOTE — Patient Instructions (Signed)
Check blood sugars on waking up 4-5x per week  Also check blood sugars about 2 hours after a meal and do this after different meals by rotation  Recommended blood sugar levels on waking up is 90-130 and about 2 hours after meal is 130-160  Please bring your blood sugar monitor to each visit, thank you  Check coverage for Contour  REFILL Trulicity  Start taking Metformin 500 mg, 1 tablet with your main meal for 5 days.  Occasionally this may initially cause loose stools or nausea. If  tolerating well after 5-7 days add a second Metformin tablet (500 mg) at the same time.  Continue adding another tablet after 5 days days if no persistent nausea or diarrhea until reaching the maximum tolerated dose or the full dose of 4 tablets once daily.  Lantus: reduce 5 units in am if supper sugar <100

## 2016-01-30 ENCOUNTER — Other Ambulatory Visit: Payer: Self-pay | Admitting: *Deleted

## 2016-01-30 ENCOUNTER — Telehealth: Payer: Self-pay | Admitting: Endocrinology

## 2016-01-30 MED ORDER — LIRAGLUTIDE 18 MG/3ML ~~LOC~~ SOPN: PEN_INJECTOR | SUBCUTANEOUS | 2 refills | Status: DC

## 2016-01-30 NOTE — Telephone Encounter (Signed)
Per Patients insurance he has to try and fail Victoza before they will approve Trulicity.  Per Dr. Dwyane Dee and rx was sent for Victoza.

## 2016-01-30 NOTE — Telephone Encounter (Signed)
Pharmacy called and said the Trulicity is requiring a PA, they didn't know if Dr. Dwyane Dee wanted to send in an alternative or start a PA. Please advise.

## 2016-02-14 ENCOUNTER — Other Ambulatory Visit: Payer: Self-pay | Admitting: Family Medicine

## 2016-02-14 DIAGNOSIS — F39 Unspecified mood [affective] disorder: Secondary | ICD-10-CM

## 2016-02-23 ENCOUNTER — Other Ambulatory Visit: Payer: Self-pay | Admitting: Family Medicine

## 2016-02-24 ENCOUNTER — Other Ambulatory Visit (INDEPENDENT_AMBULATORY_CARE_PROVIDER_SITE_OTHER): Payer: BLUE CROSS/BLUE SHIELD

## 2016-02-24 DIAGNOSIS — E1165 Type 2 diabetes mellitus with hyperglycemia: Secondary | ICD-10-CM | POA: Diagnosis not present

## 2016-02-24 DIAGNOSIS — Z794 Long term (current) use of insulin: Secondary | ICD-10-CM

## 2016-02-24 LAB — BASIC METABOLIC PANEL
BUN: 18 mg/dL (ref 6–23)
CALCIUM: 10.1 mg/dL (ref 8.4–10.5)
CO2: 29 mEq/L (ref 19–32)
Chloride: 97 mEq/L (ref 96–112)
Creatinine, Ser: 1.01 mg/dL (ref 0.40–1.50)
GFR: 109.13 mL/min (ref >60.00)
GLUCOSE: 151 mg/dL — AB (ref 70–99)
Potassium: 3 mEq/L — ABNORMAL LOW (ref 3.5–5.1)
SODIUM: 140 meq/L (ref 135–145)

## 2016-02-24 LAB — LIPID PANEL
CHOLESTEROL: 186 mg/dL (ref 0–200)
HDL: 30.7 mg/dL — ABNORMAL LOW (ref >39.00)
Total CHOL/HDL Ratio: 6
Triglycerides: 705 mg/dL — ABNORMAL HIGH (ref 0.0–149.0)

## 2016-02-24 LAB — MICROALBUMIN / CREATININE URINE RATIO
Creatinine,U: 117.8 mg/dL
MICROALB/CREAT RATIO: 2.1 mg/g (ref 0.0–30.0)
Microalb, Ur: 2.5 mg/dL — ABNORMAL HIGH (ref 0.0–1.9)

## 2016-02-24 LAB — HEMOGLOBIN A1C: Hgb A1c MFr Bld: 8.7 % — ABNORMAL HIGH (ref 4.6–6.5)

## 2016-02-24 LAB — LDL CHOLESTEROL, DIRECT: LDL DIRECT: 72 mg/dL

## 2016-02-27 ENCOUNTER — Telehealth: Payer: Self-pay | Admitting: Endocrinology

## 2016-02-27 ENCOUNTER — Ambulatory Visit (INDEPENDENT_AMBULATORY_CARE_PROVIDER_SITE_OTHER): Payer: BLUE CROSS/BLUE SHIELD | Admitting: Endocrinology

## 2016-02-27 ENCOUNTER — Encounter: Payer: Self-pay | Admitting: Endocrinology

## 2016-02-27 ENCOUNTER — Other Ambulatory Visit: Payer: Self-pay | Admitting: *Deleted

## 2016-02-27 VITALS — BP 126/82 | HR 121 | Temp 97.8°F | Resp 16 | Ht 75.0 in | Wt 326.8 lb

## 2016-02-27 DIAGNOSIS — Z23 Encounter for immunization: Secondary | ICD-10-CM | POA: Diagnosis not present

## 2016-02-27 DIAGNOSIS — E1165 Type 2 diabetes mellitus with hyperglycemia: Secondary | ICD-10-CM

## 2016-02-27 DIAGNOSIS — E781 Pure hyperglyceridemia: Secondary | ICD-10-CM

## 2016-02-27 DIAGNOSIS — Z794 Long term (current) use of insulin: Secondary | ICD-10-CM

## 2016-02-27 MED ORDER — INSULIN REGULAR HUMAN (CONC) 500 UNIT/ML ~~LOC~~ SOPN: PEN_INJECTOR | SUBCUTANEOUS | 3 refills | Status: DC

## 2016-02-27 MED ORDER — FENOFIBRATE 145 MG PO TABS: 145.0000 mg | ORAL_TABLET | Freq: Every day | ORAL | 3 refills | Status: DC

## 2016-02-27 MED ORDER — LIRAGLUTIDE 18 MG/3ML ~~LOC~~ SOPN: PEN_INJECTOR | SUBCUTANEOUS | 2 refills | Status: DC

## 2016-02-27 NOTE — Patient Instructions (Addendum)
Stop Lantus insulin  U-500 insulin 40 in am and at 60 supper (30 min before)  Adjust in steps of 5 units, 1st adjust pm dose to get am sugars   Check blood sugars on waking up  daily  Also check blood sugars about 2 hours after a meal and do this after different meals by rotation  Recommended blood sugar levels on waking up is 90-130 and about 2 hours after meal is 130-180  Please bring your blood sugar monitor to each visit, thank you.Donna Bernard 1.8mg 

## 2016-02-27 NOTE — Progress Notes (Signed)
Patient ID: BIRL LOBELLO, male   DOB: Feb 16, 1981, 35 y.o.   MRN: [id number]            Reason for Appointment: Follow-up for Type 2 Diabetes  Referring physician: [facility]   History of Present Illness:          Date of diagnosis of type 2 diabetes mellitus: 2008       Background history:   He has been on various medication regimens for his diabetes over the last several years His blood sugars have been progressively more difficult to control over the last 3-4 years and his lowest A1c was 7.6 in 2013 However he has been somewhat sporadic with his diabetes follow-up over the last few years He apparently has been intolerant to metformin because of diarrhea In 2016 he was tried on Invokana because of poor control and this has been continued  Recent history:   INSULIN regimen is:  Lantus 70 units bid    Non-insulin hypoglycemic drugs the patient is taking are: Invokana 300 mg daily, Victoza 1.2, metformin ER 2000 mg daily  Current management, blood sugar patterns and problems identified:  His blood sugars have been poorly controlled since at least 2016  Even though he has been taking relatively large doses of LANTUS he still has relatively high fasting blood sugars  Also was able to start on metformin on the last visit, previously had not taken this because of diarrhea with regular metformin.  He is able to take 2000 mg of the extended release but takes the 2 doses separately  Blood sugars are the highest FASTING with relatively higher reading earlier in the morning and only occasionally below 150  He probably has some high postprandial readings after breakfast and sometimes after supper also  However has been able to maintain his weight  Now taking VICTOZA since insurance did not cover Trulicity, tolerating 1.2 mg daily  Continues to be taking Invokana also without side effects  He has not been able to exercise.  He thinks he has to change his diet and avoiding fast  food and other high-fat foods more often        Side effects from medications have been: Diarrhea from metformin  Compliance with the medical regimen: Improving  Glucose monitoring:  done 2-3 times a day         Glucometer:  Freestyle       Blood Glucose readings by time of day and averages from meter download:  Mean values apply above for all meters except median for One Touch  PRE-MEAL Fasting Lunch Dinner Bedtime Overall  Glucose range: 146-212   106-220  109-262    Mean/median: 179  160  150  169  168    Self-care: The diet that the patient has been following is: tries to limit Fast food .     Meal times are: Dinner: 8pm           Dietician visit, most recent: at Diagnosis               Exercise:  none, back pain  Weight history:  Wt Readings from Last 3 Encounters:  02/27/16 (!) 326 lb 12.8 oz (148.2 kg)  01/21/16 (!) 328 lb (148.8 kg)  01/19/16 (!) 334 lb (151.5 kg)    Glycemic control:   Lab Results  Component Value Date   HGBA1C 8.7 (H) 02/24/2016   HGBA1C 13.9 11/19/2015   HGBA1C 12.0 (H) 10/23/2015   Lab Results  Component  Value Date   MICROALBUR 2.5 (H) 02/24/2016   LDLCALC 138 (H) 03/11/2008   CREATININE 1.01 02/24/2016   Lab Results  Component Value Date   MICRALBCREAT 2.1 02/24/2016         Medication List       Accurate as of 02/27/16  3:24 PM. Always use your most recent med list.          aspirin EC 81 MG tablet Take 1 tablet (81 mg total) by mouth daily.   azithromycin 250 MG tablet Commonly known as:  ZITHROMAX Do not fill if Zyrtec D is helping your symptoms.   B-D UF III MINI PEN NEEDLES 31G X 5 MM Misc Generic drug:  Insulin Pen Needle USE AS DIRECTED EVERY DAY   BAYER MICROLET LANCETS lancets Use as instructed to check blood sugar 4-5 times a week Dx code E11.42   blood glucose meter kit and supplies Kit Dispense based on insurance preference. Use up to four times daily as directed. DX: E11.42   canagliflozin 300  MG Tabs tablet Commonly known as:  INVOKANA Take 1 tablet (300 mg total) by mouth daily.   cetirizine-pseudoephedrine 5-120 MG tablet Commonly known as:  ZYRTEC-D ALLERGY & CONGESTION Take 1 tablet by mouth 2 (two) times daily.   chlorthalidone 25 MG tablet Commonly known as:  HYGROTON Take 1 tablet (25 mg total) by mouth daily.   CONTOUR NEXT EZ MONITOR w/Device Kit Use to check blood sugar 4-5 times a week Dx code E11.42   fluconazole 150 MG tablet Commonly known as:  DIFLUCAN Take 1 tablet (150 mg total) by mouth once. May repeat every two weeks   gabapentin 300 MG capsule Commonly known as:  NEURONTIN Take 1 capsule (300 mg total) by mouth 3 (three) times daily.   glucose blood test strip Commonly known as:  BAYER CONTOUR NEXT TEST Use as instructed to check blood sugar 4-5 times per week dx code E11.42   HYDROmorphone 2 MG tablet Commonly known as:  DILAUDID   Insulin Glargine 100 UNIT/ML Solostar Pen Commonly known as:  LANTUS SOLOSTAR Inject 10 units twice a day   liraglutide 18 MG/3ML Sopn Inject 0.6 mg the first week, then increase to 1.2 mg the second week.   metFORMIN 500 MG 24 hr tablet Commonly known as:  GLUCOPHAGE-XR Take 4 tablets (2,000 mg total) by mouth daily with supper.   ranitidine 150 MG tablet Commonly known as:  ZANTAC Take 1 tablet (150 mg total) by mouth 2 (two) times daily.   traZODone 150 MG tablet Commonly known as:  DESYREL TAKE 1 TABLET BY MOUTH AT BEDTIME       Allergies:  Allergies  Allergen Reactions  . Amoxicillin Anaphylaxis and Other (See Comments)    Has patient had a PCN reaction causing immediate rash, facial/tongue/throat swelling, SOB or lightheadedness with hypotension: yes Has patient had a PCN reaction causing severe rash involving mucus membranes or skin necrosis: no Has patient had a PCN reaction that required hospitalization yes Has patient had a PCN reaction occurring within the last 10 years: yes If all of  the above answers are "NO", then may proceed with Cephalosporin use.  [name] Penicillins Anaphylaxis and Other (See Comments)    Patient was told to list this as an allergy because he is allergic to Amoxicillin Has patient had a PCN reaction causing immediate rash, facial/tongue/throat swelling, SOB or lightheadedness with hypotension: yes for Amoxicillin Has patient had a PCN reaction causing severe rash involving  mucus membranes or skin necrosis: no Has patient had a PCN reaction that required hospitalization yes for Amoxicillin Has patient had a PCN reaction occurring within the last 10 years: yes for Amoxicillin If all o  . Other Other (See Comments)    Powder in some gloves - localized itching but not allergic to latex, benadryl usually helps with this reaction    Past Medical History:  Diagnosis Date  . Anemia   . Diabetes mellitus   . Hypertension   . Lumbar disc herniation   . PTSD (post-traumatic stress disorder)   . Scoliosis     Past Surgical History:  Procedure Laterality Date  . NO PAST SURGERIES      No family history on file.  Social History:  reports that he has been smoking Cigars.  His smokeless tobacco use includes Snuff. He reports that he drinks alcohol. He reports that he does not use drugs.   Review of Systems   Lipid history:  He has marked increase in triglycerides, no recent previous levels available for comparison; has not been on treatment before    Lab Results  Component Value Date   CHOL 186 02/24/2016   HDL 30.70 (L) 02/24/2016   LDLCALC 138 (H) 03/11/2008   LDLDIRECT 72.0 02/24/2016   TRIG (H) 02/24/2016    705.0 Triglyceride is over 400; calculations on Lipids are invalid.   CHOLHDL 6 02/24/2016           Hypertension: present, Is on on chlorthalidone alone from PCP, previously on lisinopril  Most recent eye exam was 1/17  Most recent foot exam: 8/17    LABS:  Lab on 02/24/2016  Component Date Value Ref Range Status  . Hgb A1c  MFr Bld 02/24/2016 8.7* 4.6 - 6.5 % Final  . Sodium 02/24/2016 140  135 - 145 mEq/L Final  . Potassium 02/24/2016 3.0* 3.5 - 5.1 mEq/L Final  . Chloride 02/24/2016 97  96 - 112 mEq/L Final  . CO2 02/24/2016 29  19 - 32 mEq/L Final  . Glucose, Bld 02/24/2016 151* 70 - 99 mg/dL Final  . BUN 02/24/2016 18  6 - 23 mg/dL Final  . Creatinine, Ser 02/24/2016 1.01  0.40 - 1.50 mg/dL Final  . Calcium 02/24/2016 10.1  8.4 - 10.5 mg/dL Final  . GFR 02/24/2016 109.13  >60.00 mL/min Final  . Cholesterol 02/24/2016 186  0 - 200 mg/dL Final  . Triglycerides 02/24/2016 705.0 Triglyceride is over 400; calculations on Lipids are invalid.* 0.0 - 149.0 mg/dL Final  . HDL 02/24/2016 30.70* >39.00 mg/dL Final  . Total CHOL/HDL Ratio 02/24/2016 6   Final  . Microalb, Ur 02/24/2016 2.5* 0.0 - 1.9 mg/dL Final  . Creatinine,U 02/24/2016 117.8  mg/dL Final  . Microalb Creat Ratio 02/24/2016 2.1  0.0 - 30.0 mg/g Final  . Direct LDL 02/24/2016 72.0  mg/dL Final    Physical Examination:  BP 126/82   Pulse (!) 121   Temp 97.8 F (36.6 C)   Resp 16   Ht 6' 3"  (1.905 m)   Wt (!) 326 lb 12.8 oz (148.2 kg)   SpO2 98%   BMI 40.85 kg/m       ASSESSMENT:  Diabetes type 2, uncontrolled With BMI 41 and history of neuropathy   See history of present illness for detailed discussion of current diabetes management, blood sugar patterns and problems identified  A1c is still high at 8.7 but that this is very significantly improved from his previous level about  3 months ago  His blood sugars are Overall improving but still not consistently controlled and HIGHEST blood sugars are still fasting and occasionally at bedtime He is on multiple drugs including Victoza, metformin and Invokana also in addition to his 140 units of Lantus currently With basal insulin he may not be always getting consistent control postprandial readings at various times  Has difficulty losing weight because of continued back pain and no  exercise He thinks he is trying to do fairly good with diet  Hypertension: Blood pressure Controlled now  PLAN:    Start Humulin R U-500 insulin instead of Lantus  Discussed how this is different than his basal insulin and discussed proper timing for injection, duration of action and dosage adjustment based on blood sugars every 3-5 days  He will start with a smaller dose in the morning and larger dose in the evening  Advised him to increase to doses by 5 units at a time if blood sugars after the preceding those are high in the evening dose will be adjusted based on fasting readings.  Also he will need to let us know if he tends to have hypoglycemia especially overnight  Continue metformin ER  Continue Invokana and Victoza unchanged  Consultation with diabetes educator and dietitian will be ordered again since blood sugars are overall reasonably adequate and averaging about 168 he can go for his back surgery  Patient Instructions  Stop Lantus insulin  U-500 insulin 40 in am and at 60 supper (30 min before)  Adjust in steps of 5 units, 1st adjust pm dose to get am sugars   Check blood sugars on waking up  daily  Also check blood sugars about 2 hours after a meal and do this after different meals by rotation  Recommended blood sugar levels on waking up is 90-130 and about 2 hours after meal is 130-180  Please bring your blood sugar monitor to each visit, thank you        Counseling time on subjects discussed above is over 50% of today's 25 minute visit  Gitel Beste 02/27/2016, 3:24 PM   Note: This office note was prepared with Dragon voice recognition system technology. Any transcriptional errors that result from this process are unintentional.

## 2016-02-27 NOTE — Telephone Encounter (Signed)
Received call from [place] at [facility] to clarify new Rx sig for Humulin R 500, the sig read 1.8 daily but the sig in the office note read 40 units in am and 60 units in pm. Gave pharmacy clarified directions and left message for patient to call service with any questions and to make sure and titrate meds up slowly as directed at [facility]it and in [facility].

## 2016-03-01 ENCOUNTER — Other Ambulatory Visit: Payer: Self-pay | Admitting: *Deleted

## 2016-03-02 ENCOUNTER — Other Ambulatory Visit: Payer: Self-pay | Admitting: Orthopedic Surgery

## 2016-03-02 DIAGNOSIS — M5442 Lumbago with sciatica, left side: Secondary | ICD-10-CM

## 2016-03-09 ENCOUNTER — Other Ambulatory Visit: Payer: Self-pay

## 2016-03-09 MED ORDER — INSULIN GLARGINE 100 UNIT/ML SOLOSTAR PEN: PEN_INJECTOR | SUBCUTANEOUS | 2 refills | Status: DC

## 2016-03-11 ENCOUNTER — Other Ambulatory Visit: Payer: Self-pay | Admitting: Orthopedic Surgery

## 2016-03-11 ENCOUNTER — Ambulatory Visit
Admission: RE | Admit: 2016-03-11 | Discharge: 2016-03-11 | Disposition: A | Discharge status: Home / Self Care | Payer: BLUE CROSS/BLUE SHIELD | Source: Ambulatory Visit | Attending: Orthopedic Surgery | Admitting: Orthopedic Surgery

## 2016-03-11 ENCOUNTER — Other Ambulatory Visit (INDEPENDENT_AMBULATORY_CARE_PROVIDER_SITE_OTHER): Payer: BLUE CROSS/BLUE SHIELD

## 2016-03-11 ENCOUNTER — Ambulatory Visit
Admission: RE | Admit: 2016-03-11 | Discharge: 2016-03-11 | Disposition: A | Discharge status: Home / Self Care | Payer: Self-pay | Source: Ambulatory Visit | Attending: Orthopedic Surgery | Admitting: Orthopedic Surgery

## 2016-03-11 DIAGNOSIS — Z794 Long term (current) use of insulin: Secondary | ICD-10-CM

## 2016-03-11 DIAGNOSIS — M5442 Lumbago with sciatica, left side: Secondary | ICD-10-CM

## 2016-03-11 DIAGNOSIS — E1165 Type 2 diabetes mellitus with hyperglycemia: Secondary | ICD-10-CM

## 2016-03-11 LAB — BASIC METABOLIC PANEL
BUN: 13 mg/dL (ref 6–23)
CO2: 30 meq/L (ref 19–32)
Calcium: 9.7 mg/dL (ref 8.4–10.5)
Chloride: 100 mEq/L (ref 96–112)
Creatinine, Ser: 0.91 mg/dL (ref 0.40–1.50)
GFR: 123.05 mL/min (ref >60.00)
GLUCOSE: 179 mg/dL — AB (ref 70–99)
POTASSIUM: 3.5 meq/L (ref 3.5–5.1)
SODIUM: 140 meq/L (ref 135–145)

## 2016-03-11 MED ORDER — ONDANSETRON HCL 4 MG/2ML IJ SOLN
4.0000 mg | Freq: Four times a day (QID) | INTRAMUSCULAR | Status: DC | PRN
Start: 2016-03-11 — End: 2016-03-12

## 2016-03-11 MED ORDER — DIAZEPAM 5 MG PO TABS
10.0000 mg | ORAL_TABLET | Freq: Once | ORAL | Status: AC
  Administered 2016-03-11: 10 mg via ORAL

## 2016-03-11 MED ORDER — IOPAMIDOL (ISOVUE-M 200) INJECTION 41%
15.0000 mL | Freq: Once | INTRAMUSCULAR | Status: AC
  Administered 2016-03-11: 15 mL via INTRATHECAL

## 2016-03-11 NOTE — Progress Notes (Signed)
Patient states he has been off Trazodone for at least the past two days.  jkl

## 2016-03-11 NOTE — Discharge Instructions (Addendum)
Myelogram Discharge Instructions  1. Go home and rest quietly for the next 24 hours.  It is important to lie flat for the next 24 hours.  Get up only to go to the restroom.  You may lie in the bed or on a couch on your back, your stomach, your left side or your right side.  You may have one pillow under your head.  You may have pillows between your knees while you are on your side or under your knees while you are on your back.  2. DO NOT drive today.  Recline the seat as far back as it will go, while still wearing your seat belt, on the way home.  3. You may get up to go to the bathroom as needed.  You may sit up for 10 minutes to eat.  You may resume your normal diet and medications unless otherwise indicated.  Drink lots of extra fluids today and tomorrow.  4. The incidence of headache, nausea, or vomiting is about 5% (one in 20 patients).  If you develop a headache, lie flat and drink plenty of fluids until the headache goes away.  Caffeinated beverages may be helpful.  If you develop severe nausea and vomiting or a headache that does not go away with flat bed rest, call [phone].  5. You may resume normal activities after your 24 hours of bed rest is over; however, do not exert yourself strongly or do any heavy lifting tomorrow. If when you get up you have a headache when standing, go back to bed and force fluids for another 24 hours.  6. Call your physician for a follow-up appointment.  The results of your myelogram will be sent directly to your physician by the following day.  7. If you have any questions or if complications develop after you arrive home, please call [phone].  Discharge instructions have been explained to the patient.  The patient, or the person responsible for the patient, fully understands these instructions.       May resume Trazodone on Nov. 3, 2017, after 9:00 am.

## 2016-03-12 ENCOUNTER — Other Ambulatory Visit: Payer: Self-pay | Admitting: Physician Assistant

## 2016-03-12 LAB — FRUCTOSAMINE: FRUCTOSAMINE: 254 umol/L (ref 0–285)

## 2016-03-15 ENCOUNTER — Telehealth: Payer: Self-pay

## 2016-03-15 NOTE — Telephone Encounter (Signed)
Spoke with patient after he had a myelogram here 03/11/16.  He states he has done fine and does not have a headache.  jkl

## 2016-03-16 ENCOUNTER — Ambulatory Visit (INDEPENDENT_AMBULATORY_CARE_PROVIDER_SITE_OTHER): Payer: BLUE CROSS/BLUE SHIELD | Admitting: Endocrinology

## 2016-03-16 ENCOUNTER — Encounter: Payer: Self-pay | Admitting: Endocrinology

## 2016-03-16 ENCOUNTER — Encounter: Payer: Self-pay | Admitting: Physician Assistant

## 2016-03-16 VITALS — BP 126/86 | HR 94 | Ht 75.0 in | Wt 327.0 lb

## 2016-03-16 DIAGNOSIS — E1165 Type 2 diabetes mellitus with hyperglycemia: Secondary | ICD-10-CM | POA: Diagnosis not present

## 2016-03-16 DIAGNOSIS — Z794 Long term (current) use of insulin: Secondary | ICD-10-CM

## 2016-03-16 DIAGNOSIS — N451 Epididymitis: Secondary | ICD-10-CM | POA: Insufficient documentation

## 2016-03-16 MED ORDER — INSULIN LISPRO 100 UNIT/ML (KWIKPEN): PEN_INJECTOR | SUBCUTANEOUS | 1 refills | Status: DC

## 2016-03-16 NOTE — Progress Notes (Signed)
Patient ID: [name], male   DOB: 21-May-1980, 35 y.o.   MRN: [id number]            Reason for Appointment: Follow-up for Type 2 Diabetes  Referring physician: [facility]   History of Present Illness:          Date of diagnosis of type 2 diabetes mellitus: 2008       Background history:   He has been on various medication regimens for his diabetes over the last several years His blood sugars have been progressively more difficult to control over the last 3-4 years and his lowest A1c was 7.6 in 2013 However he has been somewhat sporadic with his diabetes follow-up over the last few years He apparently has been intolerant to metformin because of diarrhea In 2016 he was tried on Invokana because of poor control and this has been continued  Recent history:   INSULIN regimen is:  Lantus 70 units bid    Non-insulin hypoglycemic drugs the patient is taking are: Invokana 300 mg daily, Victoza 1.8, metformin ER 2000 mg daily  Current management, blood sugar patterns and problems identified:  His insurance denied the coverage for the U-500 insulin since he is not taking over 200 units  He did not bring his monitor for download  He is able to check his sugars periodically after evening meal and eating these are mostly high  Morning sugars are fund somewhat variable by recall and was 179 in the lab  He has no side effects with Victoza 1.8 mg daily Appears to have lost a little weight       Side effects from medications have been: ?  Diarrhea from high dose metformin  Compliance with the medical regimen: Improving  Glucose monitoring:  done 2-3 times a day         Glucometer:  Freestyle       Blood Glucose readings by recall  Mean values apply above for all meters except median for One Touch  PRE-MEAL Fasting Lunch Dinner Bedtime Overall  Glucose range: 120-180   190-200   Mean/median:        Self-care: The diet that the patient has been following is: tries to limit Fast  food .     Meal times are: Dinner: 8pm           Dietician visit, most recent: at [facility]               Exercise:  none, Has persistent back pain  Weight history:  Wt Readings from Last 3 Encounters:  03/16/16 (!) 327 lb (148.3 kg)  02/27/16 (!) 326 lb 12.8 oz (148.2 kg)  01/21/16 (!) 328 lb (148.8 kg)    Glycemic control:   Lab Results  Component Value Date   HGBA1C 8.7 (H) 02/24/2016   HGBA1C 13.9 11/19/2015   HGBA1C 12.0 (H) 10/23/2015   Lab Results  Component Value Date   MICROALBUR 2.5 (H) 02/24/2016   LDLCALC 138 (H) 03/11/2008   CREATININE 0.91 03/11/2016   Lab Results  Component Value Date   MICRALBCREAT 2.1 02/24/2016         Medication List       Accurate as of 03/16/16  9:17 AM. Always use your most recent med list.          aspirin EC 81 MG tablet Take 1 tablet (81 mg total) by mouth daily.   B-D UF III MINI PEN NEEDLES 31G X 5 MM Misc Generic drug:  Insulin Pen Needle USE AS DIRECTED EVERY DAY   BAYER MICROLET LANCETS lancets Use as instructed to check blood sugar 4-5 times a week Dx code E11.42   blood glucose meter kit and supplies Kit Dispense based on insurance preference. Use up to four times daily as directed. DX: E11.42   canagliflozin 300 MG Tabs tablet Commonly known as:  INVOKANA Take 1 tablet (300 mg total) by mouth daily.   cetirizine-pseudoephedrine 5-120 MG tablet Commonly known as:  ZYRTEC-D ALLERGY & CONGESTION Take 1 tablet by mouth 2 (two) times daily.   chlorthalidone 25 MG tablet Commonly known as:  HYGROTON Take 1 tablet (25 mg total) by mouth daily.   CONTOUR NEXT EZ MONITOR w/Device Kit Use to check blood sugar 4-5 times a week Dx code E11.42   fenofibrate 145 MG tablet Commonly known as:  TRICOR Take 1 tablet (145 mg total) by mouth daily.   fluconazole 150 MG tablet Commonly known as:  DIFLUCAN Take 1 tablet (150 mg total) by mouth once. May repeat every two weeks   gabapentin 300 MG  capsule Commonly known as:  NEURONTIN Take 1 capsule (300 mg total) by mouth 3 (three) times daily.   glucose blood test strip Commonly known as:  BAYER CONTOUR NEXT TEST Use as instructed to check blood sugar 4-5 times per week dx code E11.42   HYDROmorphone 2 MG tablet Commonly known as:  DILAUDID   Insulin Glargine 100 UNIT/ML Solostar Pen Commonly known as:  LANTUS SOLOSTAR Inject 10 units twice a day   insulin lispro 100 UNIT/ML KiwkPen Commonly known as:  HUMALOG KWIKPEN 10 units before each meal   liraglutide 18 MG/3ML Sopn Inject 0.6 mg the first week, then increase to 1.2 mg the second week.   metFORMIN 500 MG 24 hr tablet Commonly known as:  GLUCOPHAGE-XR Take 4 tablets (2,000 mg total) by mouth daily with supper.   ranitidine 150 MG tablet Commonly known as:  ZANTAC Take 1 tablet (150 mg total) by mouth 2 (two) times daily.   traZODone 150 MG tablet Commonly known as:  DESYREL TAKE 1 TABLET BY MOUTH AT BEDTIME       Allergies:  Allergies  Allergen Reactions  . Amoxicillin Anaphylaxis and Other (See Comments)    Has patient had a PCN reaction causing immediate rash, facial/tongue/throat swelling, SOB or lightheadedness with hypotension: yes Has patient had a PCN reaction causing severe rash involving mucus membranes or skin necrosis: no Has patient had a PCN reaction that required hospitalization yes Has patient had a PCN reaction occurring within the last 10 years: yes If all of the above answers are "NO", then may proceed with Cephalosporin use.  [name] Penicillins Anaphylaxis and Other (See Comments)    Patient was told to list this as an allergy because he is allergic to Amoxicillin Has patient had a PCN reaction causing immediate rash, facial/tongue/throat swelling, SOB or lightheadedness with hypotension: yes for Amoxicillin Has patient had a PCN reaction causing severe rash involving mucus membranes or skin necrosis: no Has patient had a PCN reaction that  required hospitalization yes for Amoxicillin Has patient had a PCN reaction occurring within the last 10 years: yes for Amoxicillin If all o  . Other Other (See Comments)    Powder in some gloves - localized itching but not allergic to latex, benadryl usually helps with this reaction    Past Medical History:  Diagnosis Date  . Anemia   . Diabetes mellitus   . Hypertension   .  Lumbar disc herniation   . PTSD (post-traumatic stress disorder)   . Scoliosis     Past Surgical History:  Procedure Laterality Date  . NO PAST SURGERIES      No family history on file.  Social History:  reports that he has been smoking Cigars.  His smokeless tobacco use includes Snuff. He reports that he drinks alcohol. He reports that he does not use drugs.   Review of Systems   Lipid history:  He has marked increase in triglycerides, no recent previous levels available for comparison; has not been on treatment before    Lab Results  Component Value Date   CHOL 186 02/24/2016   HDL 30.70 (L) 02/24/2016   LDLCALC 138 (H) 03/11/2008   LDLDIRECT 72.0 02/24/2016   TRIG (H) 02/24/2016    705.0 Triglyceride is over 400; calculations on Lipids are invalid.   CHOLHDL 6 02/24/2016           Hypertension: present, Is on on chlorthalidone alone from PCP, previously on lisinopril POTASSIUM is low normal  Most recent eye exam was 1/17  Most recent foot exam: 8/17    LABS:  Lab on 03/11/2016  Component Date Value Ref Range Status  . Sodium 03/11/2016 140  135 - 145 mEq/L Final  . Potassium 03/11/2016 3.5  3.5 - 5.1 mEq/L Final  . Chloride 03/11/2016 100  96 - 112 mEq/L Final  . CO2 03/11/2016 30  19 - 32 mEq/L Final  . Glucose, Bld 03/11/2016 179* 70 - 99 mg/dL Final  . BUN 03/11/2016 13  6 - 23 mg/dL Final  . Creatinine, Ser 03/11/2016 0.91  0.40 - 1.50 mg/dL Final  . Calcium 03/11/2016 9.7  8.4 - 10.5 mg/dL Final  . GFR 03/11/2016 123.05  >60.00 mL/min Final  . Fructosamine 03/12/2016 254   0 - 285 umol/L Final   Comment: Published reference interval for apparently healthy subjects between age 95 and 40 is 64 - 285 umol/L and in a poorly controlled diabetic population is 228 - 563 umol/L with a mean of 396 umol/L.     Physical Examination:  BP 126/86   Pulse 94   Ht 6' 3"  (1.905 m)   Wt (!) 327 lb (148.3 kg)   SpO2 97%   BMI 40.87 kg/m       ASSESSMENT:  Diabetes type 2, uncontrolled With BMI 41 and history of neuropathy   See history of present illness for detailed discussion of current diabetes management, blood sugar patterns and problems identified  Although his fructosamine is excellent he still has periodic high readings after supper and in the morning most likely because of higher postprandial readings despite taking Victoza   PLAN:    Start Humalog insulin 10 units at suppertime.  Discussed timing of taking this insulin  He will target the readings after supper to keep them under 180 at least  He can adjust the insulin further if postprandial readings are higher and also may be needing to reduce 2-3 units for lower carbohydrate meals  No change in other medications  There are no Patient Instructions on file for this visit.   Hazel Leveille 03/16/2016, 9:17 AM   Note: This office note was prepared with Dragon voice recognition system technology. Any transcriptional errors that result from this process are unintentional.

## 2016-03-17 ENCOUNTER — Other Ambulatory Visit: Payer: Self-pay | Admitting: Family Medicine

## 2016-03-17 DIAGNOSIS — F39 Unspecified mood [affective] disorder: Secondary | ICD-10-CM

## 2016-03-23 ENCOUNTER — Ambulatory Visit (INDEPENDENT_AMBULATORY_CARE_PROVIDER_SITE_OTHER): Payer: BLUE CROSS/BLUE SHIELD | Admitting: Urgent Care

## 2016-03-23 VITALS — BP 122/86 | HR 107 | Temp 97.6°F | Resp 17 | Ht 75.0 in | Wt 331.0 lb

## 2016-03-23 DIAGNOSIS — G629 Polyneuropathy, unspecified: Secondary | ICD-10-CM | POA: Diagnosis not present

## 2016-03-23 DIAGNOSIS — Z794 Long term (current) use of insulin: Secondary | ICD-10-CM | POA: Diagnosis not present

## 2016-03-23 DIAGNOSIS — B379 Candidiasis, unspecified: Secondary | ICD-10-CM

## 2016-03-23 DIAGNOSIS — E1142 Type 2 diabetes mellitus with diabetic polyneuropathy: Secondary | ICD-10-CM

## 2016-03-23 MED ORDER — GABAPENTIN 600 MG PO TABS: 600.0000 mg | ORAL_TABLET | Freq: Three times a day (TID) | ORAL | 3 refills | Status: DC

## 2016-03-23 MED ORDER — FLUCONAZOLE 150 MG PO TABS: 150.0000 mg | ORAL_TABLET | Freq: Once | ORAL | 2 refills | Status: AC

## 2016-03-23 NOTE — Progress Notes (Signed)
MRN: [id number] DOB: 1980-07-05  Subjective:   IBAN [name] is a 35 y.o. male presenting for chief complaint of Medication Problem (Pt states gabapentin not working )  Neuropathy - Patient has Type 2 DM, taking gabapentin for neuropathy. Reports sharp, stabbing pain of his feet. Occurs every day, mostly at night or when he sits for prolonged periods of time. He has been taking Gabapentin which worked initially but is no longer providing any relief. He sees Dr. Dwyane Dee for management of his diabetes. His last a1c was down 8.7. He checks his feet every day, has regular foot exams with Dr. Dwyane Dee. He sees an orthopedist for ruptured disc. Denies smoking cigarettes.  Diflucan - Patient reports history of yeast infection secondary to his diabetes. He has taken diflucan every two weeks with good response. Reports that the yeast infection of his penis is no longer bothering him and would like a refill.  [name] has a current medication list which includes the following prescription(s): aspirin ec, b-d uf iii mini pen needles, bayer microlet lancets, blood glucose meter kit and supplies, contour next ez monitor, canagliflozin, chlorthalidone, fenofibrate, fluconazole, gabapentin, glucose blood, hydromorphone, insulin glargine, liraglutide, metformin, ranitidine, and trazodone. Also is allergic to amoxicillin; penicillins; and other.  [name]  has a past medical history of Anemia; Diabetes mellitus; Hypertension; Lumbar disc herniation; PTSD (post-traumatic stress disorder); and Scoliosis. Also  has a past surgical history that includes No past surgeries.  Objective:   Vitals: BP 122/86 (BP Location: Left Arm, Patient Position: Sitting, Cuff Size: Large)   Pulse (!) 107   Temp 97.6 F (36.4 C) (Oral)   Resp 17   Ht _0  (1.905 m)   Wt (!) 331 lb (150.1 kg)   SpO2 99%   BMI 41.37 kg/m   Physical Exam  Constitutional: He is oriented to person, place, and time. He appears well-developed and  well-nourished.  Cardiovascular: Normal rate.   Pulmonary/Chest: Effort normal.  Neurological: He is alert and oriented to person, place, and time.  Skin: Skin is warm and dry.   Diabetic Foot Form - Detailed   Diabetic Foot Exam - detailed Diabetic Foot exam was performed with the following findings:  Yes 03/23/2016  8:37 AM  Is there a history of foot ulcer?:  No Can the patient see the bottom of their feet?:  Yes Are the shoes appropriate in style and fit?:  Yes Is there swelling or and abnormal foot shape?:  No Are the toenails long?:  No Are the toenails thick?:  No Do you have pain in calf while walking?:  No Is there a claw toe deformity?:  No Is there elevated skin temparature?:  No Is there limited skin dorsiflexion?:  No Is there foot or ankle muscle weakness?:  No Are the toenails ingrown?:  No Normal Range of Motion:  Yes Right posterior Tibialias:  Present Left posterior Tibialias:  Present  Right Dorsalis Pedis:  Present Left Dorsalis Pedis:  Present  Semmes-Weinstein Monofilament Test R Foot Test Control:  Pos L Foot Test Control:  Neg  R Site 1-Great Toe:  Pos L Site 1-Great Toe:  Neg  R Site 4:  Pos L Site 4:  Neg  R Site 5:  Pos L Site 5:  Neg        Assessment and Plan :   1. Type 2 diabetes mellitus with diabetic polyneuropathy, with long-term current use of insulin ([place]) 2. Peripheral polyneuropathy ([place]) - Patient agreed to increase dose  of gabapentin to 669m TID. He has previously taken percocet and dilaudid for his back pain which has not helped his neuropathy. Consider starting Lyrica in 4 weeks if there is no improvement. Patient agreed.  3. Monilia infection - Provided refill for patient but recommended he hold off on taking this to see how he does without consistently taking the medication. Patient agreed.  MJaynee Eagles PA-C [facility] [phone]/14/2017 8:20 AM

## 2016-03-23 NOTE — Patient Instructions (Addendum)
Diabetic Neuropathy Diabetic neuropathy is a nerve disease or nerve damage that is caused by diabetes mellitus. About half of all people with diabetes mellitus have some form of nerve damage. Nerve damage is more common in those who have had diabetes mellitus for many years and who generally have not had good control of their blood sugar (glucose) level. Diabetic neuropathy is a common complication of diabetes mellitus. There are three common types of diabetic neuropathy and a fourth type that is less common and less understood:  Peripheral neuropathy-This is the most common type of diabetic neuropathy. It causes damage to the nerves of the feet and legs first and then eventually the hands and arms. The damage affects the ability to sense touch.  Autonomic neuropathy-This type causes damage to the autonomic nervous system, which controls the following functions:  Heartbeat.  Body temperature.  Blood pressure.  Urination.  Digestion.  Sweating.  Sexual function.  Focal neuropathy-Focal neuropathy can be painful and unpredictable and occurs most often in older adults with diabetes mellitus. It involves a specific nerve or one area and often comes on suddenly. It usually does not cause long-term problems.  Radiculoplexus neuropathy- Sometimes called lumbosacral radiculoplexus neuropathy, radiculoplexus neuropathy affects the nerves of the thighs, hips, buttocks, or legs. It is more common in people with type 2 diabetes mellitus and in older men. It is characterized by debilitating pain, weakness, and atrophy, usually in the thigh muscles. What are the causes? The cause of peripheral, autonomic, and focal neuropathies is diabetes mellitus that is uncontrolled and high glucose levels. The cause of radiculoplexus neuropathy is unknown. However, it is thought to be caused by inflammation related to uncontrolled glucose levels. What are the signs or symptoms? Peripheral Neuropathy  Peripheral  neuropathy develops slowly over time. When the nerves of the feet and legs no longer work there may be:  Burning, stabbing, or aching pain in the legs or feet.  Inability to feel pressure or pain in your feet. This can lead to:  Thick calluses over pressure areas.  Pressure sores.  Ulcers.  Foot deformities.  Reduced ability to feel temperature changes.  Muscle weakness. Autonomic Neuropathy  The symptoms of autonomic neuropathy vary depending on which nerves are affected. Symptoms may include:  Problems with digestion, such as:  Feeling sick to your stomach (nausea).  Vomiting.  Bloating.  Constipation.  Diarrhea.  Abdominal pain.  Difficulty with urination. This occurs if you lose your ability to sense when your bladder is full. Problems include:  Urine leakage (incontinence).  Inability to empty your bladder completely (retention).  Rapid or irregular heartbeat (palpitations).  Blood pressure drops when you stand up (orthostatic hypotension). When you stand up you may feel:  Dizzy.  Weak.  Faint.  In men, inability to attain and maintain an erection.  In women, vaginal dryness and problems with decreased sexual desire and arousal.  Problems with body temperature regulation.  Increased or decreased sweating. Focal Neuropathy  Abnormal eye movements or abnormal alignment of both eyes.  Weakness in the wrist.  Foot drop. This results in an inability to lift the foot properly and abnormal walking or foot movement.  Paralysis on one side of your face (Bell palsy).  Chest or abdominal pain. Radiculoplexus Neuropathy  Sudden, severe pain in your hip, thigh, or buttocks.  Weakness and wasting of thigh muscles.  Difficulty rising from a seated position.  Abdominal swelling.  Unexplained weight loss (usually more than 10 lb [4.5 kg]). How is this  diagnosed? Peripheral Neuropathy  Your senses may be tested. Sensory function testing can be done  with:  A light touch using a monofilament.  A vibration with tuning fork.  A sharp sensation with a pin prick. Other tests that can help diagnose neuropathy are:  Nerve conduction velocity. This test checks the transmission of an electrical current through a nerve.  Electromyography. This shows how muscles respond to electrical signals transmitted by nearby nerves.  Quantitative sensory testing. This is used to assess how your nerves respond to vibrations and changes in temperature. Autonomic Neuropathy  Diagnosis is often based on reported symptoms. Tell your health care provider if you experience:  Dizziness.  Constipation.  Diarrhea.  Inappropriate urination or inability to urinate.  Inability to get or maintain an erection. Tests that may be done include:  Electrocardiography or Holter monitor. These are tests that can help show problems with the heart rate or heart rhythm.  An X-ray exam may be done. Focal Neuropathy  Diagnosis is made based on your symptoms and what your health care provider finds during your exam. Other tests may be done. They may include:  Nerve conduction velocities. This checks the transmission of electrical current through a nerve.  Electromyography. This shows how muscles respond to electrical signals transmitted by nearby nerves.  Quantitative sensory testing. This test is used to assess how your nerves respond to vibration and changes in temperature. Radiculoplexus Neuropathy  Often the first thing is to eliminate any other issue or problems that might be the cause, as there is no standard test for diagnosis.  X-ray exam of your spine and lumbar region.  Spinal tap to rule out cancer.  MRI to rule out other lesions. How is this treated? Once nerve damage occurs, it cannot be reversed. The goal of treatment is to keep the disease or nerve damage from getting worse and affecting more nerve fibers. Controlling your blood glucose level is  the key. Most people with radiculoplexus neuropathy see at least a partial improvement over time. You will need to keep your blood glucose and HbA1c levels in the target range determined by your health care provider. Things that help control blood glucose levels include:  Blood glucose monitoring.  Meal planning.  Physical activity.  Diabetes medicine. Over time, maintaining lower blood glucose levels helps lessen symptoms. Sometimes, prescription pain medicine is needed. Follow these instructions at home:  Do not smoke.  Keep your blood glucose level in the range that you and your health care provider have determined acceptable for you.  Keep your blood pressure level in the range that you and your health care provider have determined acceptable for you.  Eat a well-balanced diet.  Be physically active every day. Include strength training and balance exercises.  Protect your feet.  Check your feet every day for sores, cuts, blisters, or signs of infection.  Wear padded socks and supportive shoes. Use orthotic inserts, if necessary.  Regularly check the insides of your shoes for worn spots. Make sure there are no rocks or other items inside your shoes before you put them on. Contact a health care provider if:  You have burning, stabbing, or aching pain in the legs or feet.  You are unable to feel pressure or pain in your feet.  You develop problems with digestion such as:  Nausea.  Vomiting.  Bloating.  Constipation.  Diarrhea.  Abdominal pain.  You have difficulty with urination, such as:  Incontinence.  Retention.  You have palpitations.  You develop orthostatic hypotension. When you stand up you may feel:  Dizzy.  Weak.  Faint.  You cannot attain and maintain an erection (in men).  You have vaginal dryness and problems with decreased sexual desire and arousal (in women).  You have severe pain in your thighs, legs, or buttocks.  You have  unexplained weight loss. This information is not intended to replace advice given to you by your health care provider. Make sure you discuss any questions you have with your health care provider. Document Released: 07/05/2001 Document Revised: 10/02/2015 Document Reviewed: 10/05/2012 Elsevier Interactive Patient Education  2017 [employer].     IF you received an x-ray today, you will receive an invoice from [facility]. Please contact [facility] at [phone] with questions or concerns regarding your invoice.   IF you received labwork today, you will receive an invoice from [employer]. Please contact [facility] at [phone] with questions or concerns regarding your invoice.   Our billing staff will not be able to assist you with questions regarding bills from these companies.  You will be contacted with the lab results as soon as they are available. The fastest way to get your results is to activate your My Chart account. Instructions are located on the last page of this paperwork. If you have not heard from [place] regarding the results in 2 weeks, please contact this office.

## 2016-03-29 ENCOUNTER — Telehealth: Payer: Self-pay | Admitting: General Practice

## 2016-03-29 NOTE — Telephone Encounter (Signed)
[facility] - [phone]   Pt's Hemo log insulin is requiring a PA.

## 2016-03-31 MED ORDER — INSULIN ASPART 100 UNIT/ML FLEXPEN: 10.0000 [IU] | PEN_INJECTOR | Freq: Three times a day (TID) | SUBCUTANEOUS | 5 refills | Status: DC

## 2016-03-31 NOTE — Telephone Encounter (Signed)
I contacted the patient and advised via voicemail we have switched his humalog to novolog due to insurance reasons via voicemail. Requested a call back if the patient would like to discuss.

## 2016-03-31 NOTE — Addendum Note (Signed)
Addended by: Verlin Grills T on: 03/31/2016 11:54 AM   Modules accepted: Orders

## 2016-03-31 NOTE — Telephone Encounter (Signed)
Novolog is appropriate

## 2016-03-31 NOTE — Telephone Encounter (Signed)
Humalog is not covered under the insurance formulary, but novolog is. Can we switch to novolog?

## 2016-04-16 NOTE — Progress Notes (Signed)
Scheduling pre op- please place SURGICAL ORDERS IN EPIC  Thanks 

## 2016-04-20 ENCOUNTER — Other Ambulatory Visit: Payer: BLUE CROSS/BLUE SHIELD

## 2016-04-21 ENCOUNTER — Other Ambulatory Visit: Payer: Self-pay | Admitting: Family Medicine

## 2016-04-23 ENCOUNTER — Ambulatory Visit: Payer: BLUE CROSS/BLUE SHIELD | Admitting: Endocrinology

## 2016-04-25 ENCOUNTER — Other Ambulatory Visit: Payer: Self-pay | Admitting: Family Medicine

## 2016-04-26 NOTE — Patient Instructions (Signed)
[name]  04/26/2016   Your procedure is scheduled on: 05/05/2016    Report to [facility] Main  Entrance take [place]  elevators to 3rd floor to  [place] at   [place] AM.  Call this number if you have problems the morning of surgery [phone]   Remember: ONLY 1 PERSON MAY GO WITH YOU TO SHORT STAY TO GET  READY MORNING OF YOUR SURGERY.  Do not eat food or drink liquids :After Midnight.     Take these medicines the morning of surgery with A SIP OF WATER: Gabapentin ( Neurontin) , Zantac                                 You may not have any metal on your body including hair pins and              piercings  Do not wear jewelry, , lotions, powders or perfumes, deodorant                          Men may shave face and neck.   Do not bring valuables to the hospital. [place].  Contacts, dentures or bridgework may not be worn into surgery.  Leave suitcase in the car. After surgery it may be brought to your room.      Special Instructions: N/A              Please read over the following fact sheets you were given: _____________________________________________________________________             [facility] - Preparing for Surgery Before surgery, you can play an important role.  Because skin is not sterile, your skin needs to be as free of germs as possible.  You can reduce the number of germs on your skin by washing with CHG (chlorahexidine gluconate) soap before surgery.  CHG is an antiseptic cleaner which kills germs and bonds with the skin to continue killing germs even after washing. Please DO NOT use if you have an allergy to CHG or antibacterial soaps.  If your skin becomes reddened/irritated stop using the CHG and inform your nurse when you arrive at Short Stay. Do not shave (including legs and underarms) for at least 48 hours prior to the first CHG shower.  You may shave your  face/neck. Please follow these instructions carefully:  1.  Shower with CHG Soap the night before surgery and the  morning of Surgery.  2.  If you choose to wash your hair, wash your hair first as usual with your  normal  shampoo.  3.  After you shampoo, rinse your hair and body thoroughly to remove the  shampoo.                           4.  Use CHG as you would any other liquid soap.  You can apply chg directly  to the skin and wash                       Gently with a scrungie or clean washcloth.  5.  Apply the CHG Soap to your  body ONLY FROM THE NECK DOWN.   Do not use on face/ open                           Wound or open sores. Avoid contact with eyes, ears mouth and genitals (private parts).                       Wash face,  Genitals (private parts) with your normal soap.             6.  Wash thoroughly, paying special attention to the area where your surgery  will be performed.  7.  Thoroughly rinse your body with warm water from the neck down.  8.  DO NOT shower/wash with your normal soap after using and rinsing off  the CHG Soap.                9.  Pat yourself dry with a clean towel.            10.  Wear clean pajamas.            11.  Place clean sheets on your bed the night of your first shower and do not  sleep with pets. Day of Surgery : Do not apply any lotions/deodorants the morning of surgery.  Please wear clean clothes to the hospital/surgery center.  FAILURE TO FOLLOW THESE INSTRUCTIONS MAY RESULT IN THE CANCELLATION OF YOUR SURGERY PATIENT SIGNATURE_________________________________  NURSE SIGNATURE__________________________________  ________________________________________________________________________  WHAT IS A BLOOD TRANSFUSION? Blood Transfusion Information  A transfusion is the replacement of blood or some of its parts. Blood is made up of multiple cells which provide different functions.  Red blood cells carry oxygen and are used for blood loss  replacement.  White blood cells fight against infection.  Platelets control bleeding.  Plasma helps clot blood.  Other blood products are available for specialized needs, such as hemophilia or other clotting disorders. BEFORE THE TRANSFUSION  Who gives blood for transfusions?   Healthy volunteers who are fully evaluated to make sure their blood is safe. This is blood bank blood. Transfusion therapy is the safest it has ever been in the practice of medicine. Before blood is taken from a donor, a complete history is taken to make sure that person has no history of diseases nor engages in risky social behavior (examples are intravenous drug use or sexual activity with multiple partners). The donor's travel history is screened to minimize risk of transmitting infections, such as malaria. The donated blood is tested for signs of infectious diseases, such as HIV and hepatitis. The blood is then tested to be sure it is compatible with you in order to minimize the chance of a transfusion reaction. If you or a relative donates blood, this is often done in anticipation of surgery and is not appropriate for emergency situations. It takes many days to process the donated blood. RISKS AND COMPLICATIONS Although transfusion therapy is very safe and saves many lives, the main dangers of transfusion include:   Getting an infectious disease.  Developing a transfusion reaction. This is an allergic reaction to something in the blood you were given. Every precaution is taken to prevent this. The decision to have a blood transfusion has been considered carefully by your caregiver before blood is given. Blood is not given unless the benefits outweigh the risks. AFTER THE TRANSFUSION  Right after receiving a blood transfusion, you will usually feel  much better and more energetic. This is especially true if your red blood cells have gotten low (anemic). The transfusion raises the level of the red blood cells which  carry oxygen, and this usually causes an energy increase.  The nurse administering the transfusion will monitor you carefully for complications. HOME CARE INSTRUCTIONS  No special instructions are needed after a transfusion. You may find your energy is better. Speak with your caregiver about any limitations on activity for underlying diseases you may have. SEEK MEDICAL CARE IF:   Your condition is not improving after your transfusion.  You develop redness or irritation at the intravenous (IV) site. SEEK IMMEDIATE MEDICAL CARE IF:  Any of the following symptoms occur over the next 12 hours:  Shaking chills.  You have a temperature by mouth above 102 F (38.9 C), not controlled by medicine.  Chest, back, or muscle pain.  People around you feel you are not acting correctly or are confused.  Shortness of breath or difficulty breathing.  Dizziness and fainting.  You get a rash or develop hives.  You have a decrease in urine output.  Your urine turns a dark color or changes to pink, red, or brown. Any of the following symptoms occur over the next 10 days:  You have a temperature by mouth above 102 F (38.9 C), not controlled by medicine.  Shortness of breath.  Weakness after normal activity.  The white part of the eye turns yellow (jaundice).  You have a decrease in the amount of urine or are urinating less often.  Your urine turns a dark color or changes to pink, red, or brown. Document Released: 04/23/2000 Document Revised: 07/19/2011 Document Reviewed: 12/11/2007 ExitCare Patient Information 2014 [place].  _______________________________________________________________________  Incentive Spirometer  An incentive spirometer is a tool that can help keep your lungs clear and active. This tool measures how well you are filling your lungs with each breath. Taking long deep breaths may help reverse or decrease the chance of developing breathing (pulmonary) problems  (especially infection) following:  A long period of time when you are unable to move or be active. BEFORE THE PROCEDURE   If the spirometer includes an indicator to show your best effort, your nurse or respiratory therapist will set it to a desired goal.  If possible, sit up straight or lean slightly forward. Try not to slouch.  Hold the incentive spirometer in an upright position. INSTRUCTIONS FOR USE  1. Sit on the edge of your bed if possible, or sit up as far as you can in bed or on a chair. 2. Hold the incentive spirometer in an upright position. 3. Breathe out normally. 4. Place the mouthpiece in your mouth and seal your lips tightly around it. 5. Breathe in slowly and as deeply as possible, raising the piston or the ball toward the top of the column. 6. Hold your breath for 3-5 seconds or for as long as possible. Allow the piston or ball to fall to the bottom of the column. 7. Remove the mouthpiece from your mouth and breathe out normally. 8. Rest for a few seconds and repeat Steps 1 through 7 at least 10 times every 1-2 hours when you are awake. Take your time and take a few normal breaths between deep breaths. 9. The spirometer may include an indicator to show your best effort. Use the indicator as a goal to work toward during each repetition. 10. After each set of 10 deep breaths, practice coughing to be  sure your lungs are clear. If you have an incision (the cut made at the time of surgery), support your incision when coughing by placing a pillow or rolled up towels firmly against it. Once you are able to get out of bed, walk around indoors and cough well. You may stop using the incentive spirometer when instructed by your caregiver.  RISKS AND COMPLICATIONS  Take your time so you do not get dizzy or light-headed.  If you are in pain, you may need to take or ask for pain medication before doing incentive spirometry. It is harder to take a deep breath if you are having  pain. AFTER USE  Rest and breathe slowly and easily.  It can be helpful to keep track of a log of your progress. Your caregiver can provide you with a simple table to help with this. If you are using the spirometer at home, follow these instructions: [place] IF:   You are having difficultly using the spirometer.  You have trouble using the spirometer as often as instructed.  Your pain medication is not giving enough relief while using the spirometer.  You develop fever of 100.5 F (38.1 C) or higher. SEEK IMMEDIATE MEDICAL CARE IF:   You cough up bloody sputum that had not been present before.  You develop fever of 102 F (38.9 C) or greater.  You develop worsening pain at or near the incision site. MAKE SURE YOU:   Understand these instructions.  Will watch your condition.  Will get help right away if you are not doing well or get worse. Document Released: 09/06/2006 Document Revised: 07/19/2011 Document Reviewed: 11/07/2006 ExitCare Patient Information 2014 ExitCare, [place].   ________________________________________________________________________ How to Manage Your Diabetes Before and After Surgery  Why is it important to control my blood sugar before and after surgery? . Improving blood sugar levels before and after surgery helps healing and can limit problems. . A way of improving blood sugar control is eating a healthy diet by: o  Eating less sugar and carbohydrates o  Increasing activity/exercise o  Talking with your doctor about reaching your blood sugar goals . High blood sugars (greater than 180 mg/dL) can raise your risk of infections and slow your recovery, so you will need to focus on controlling your diabetes during the weeks before surgery. . Make sure that the doctor who takes care of your diabetes knows about your planned surgery including the date and location.  How do I manage my blood sugar before surgery? . Check your blood sugar at  least 4 times a day, starting 2 days before surgery, to make sure that the level is not too high or low. o Check your blood sugar the morning of your surgery when you wake up and every 2 hours until you get to the Short Stay unit. . If your blood sugar is less than 70 mg/dL, you will need to treat for low blood sugar: o Do not take insulin. o Treat a low blood sugar (less than 70 mg/dL) with  cup of clear juice (cranberry or apple), 4 glucose tablets, OR glucose gel. o Recheck blood sugar in 15 minutes after treatment (to make sure it is greater than 70 mg/dL). If your blood sugar is not greater than 70 mg/dL on recheck, call [phone] for further instructions. . Report your blood sugar to the short stay nurse when you get to Short Stay.  . If you are admitted to the hospital after surgery: o Your  blood sugar will be checked by the staff and you will probably be given insulin after surgery (instead of oral diabetes medicines) to make sure you have good blood sugar levels. o The goal for blood sugar control after surgery is 80-180 mg/dL.   WHAT DO I DO ABOUT MY DIABETES MEDICATION?  [name] Do not take oral diabetes medicines (pills) the morning of surgery.  . THE NIGHT BEFORE SURGERY, take ___________ units of ___________insulin.       . THE MORNING OF SURGERY, take _____________ units of __________insulin.  . The day of surgery, do not take other diabetes injectables, including Byetta (exenatide), Bydureon (exenatide ER), Victoza (liraglutide), or Trulicity (dulaglutide).  . If your CBG is greater than 220 mg/dL, you may take  of your sliding scale  . (correction) dose of insulin.    For patients with insulin pumps: Contact your diabetes doctor for specific instructions before surgery. Decrease basal rates by 20% at midnight the night before your surgery. Note that if your surgery is planned to be longer than 2 hours, your insulin pump will be removed and intravenous (IV) insulin will  be started and managed by the nurses and the anesthesiologist. You will be able to restart your insulin pump once you are awake and able to manage it.  Make sure to bring insulin pump supplies to the hospital with you in case the  site needs to be changed.  Patient Signature:  Date:   Nurse Signature:  Date:   Reviewed and Endorsed by [facility] Patient Education Committee, August 2015

## 2016-04-28 ENCOUNTER — Encounter (HOSPITAL_COMMUNITY)
Admission: RE | Admit: 2016-04-28 | Discharge: 2016-04-28 | Disposition: A | Discharge status: Home / Self Care | Payer: BLUE CROSS/BLUE SHIELD | Source: Ambulatory Visit | Attending: Orthopedic Surgery | Admitting: Orthopedic Surgery

## 2016-04-28 ENCOUNTER — Encounter (HOSPITAL_COMMUNITY): Payer: Self-pay

## 2016-04-28 ENCOUNTER — Ambulatory Visit (HOSPITAL_COMMUNITY)
Admission: RE | Admit: 2016-04-28 | Discharge: 2016-04-28 | Disposition: A | Discharge status: Home / Self Care | Payer: BLUE CROSS/BLUE SHIELD | Source: Ambulatory Visit | Attending: Surgical | Admitting: Surgical

## 2016-04-28 DIAGNOSIS — Z0183 Encounter for blood typing: Secondary | ICD-10-CM | POA: Diagnosis not present

## 2016-04-28 DIAGNOSIS — Z01812 Encounter for preprocedural laboratory examination: Secondary | ICD-10-CM | POA: Diagnosis not present

## 2016-04-28 DIAGNOSIS — M48061 Spinal stenosis, lumbar region without neurogenic claudication: Secondary | ICD-10-CM | POA: Diagnosis not present

## 2016-04-28 DIAGNOSIS — M5126 Other intervertebral disc displacement, lumbar region: Secondary | ICD-10-CM | POA: Diagnosis not present

## 2016-04-28 DIAGNOSIS — M545 Low back pain, unspecified: Secondary | ICD-10-CM

## 2016-04-28 DIAGNOSIS — M4186 Other forms of scoliosis, lumbar region: Secondary | ICD-10-CM | POA: Diagnosis not present

## 2016-04-28 HISTORY — DX: Myoneural disorder, unspecified: G70.9

## 2016-04-28 HISTORY — DX: Depression, unspecified: F32.A

## 2016-04-28 HISTORY — DX: Gastro-esophageal reflux disease without esophagitis: K21.9

## 2016-04-28 HISTORY — DX: Major depressive disorder, single episode, unspecified: F32.9

## 2016-04-28 HISTORY — DX: Anxiety disorder, unspecified: F41.9

## 2016-04-28 LAB — CBC WITH DIFFERENTIAL/PLATELET
Basophils Absolute: 0 10*3/uL (ref 0.0–0.1)
Basophils Relative: 0 %
Eosinophils Absolute: 0.3 10*3/uL (ref 0.0–0.7)
Eosinophils Relative: 2 %
HCT: 40.6 % (ref 39.0–52.0)
Hemoglobin: 13.9 g/dL (ref 13.0–17.0)
Lymphocytes Relative: 26 %
Lymphs Abs: 3.2 10*3/uL (ref 0.7–4.0)
MCH: 26.4 pg (ref 26.0–34.0)
MCHC: 34.2 g/dL (ref 30.0–36.0)
MCV: 77 fL — ABNORMAL LOW (ref 78.0–100.0)
Monocytes Absolute: 0.7 10*3/uL (ref 0.1–1.0)
Monocytes Relative: 6 %
Neutro Abs: 8.1 10*3/uL — ABNORMAL HIGH (ref 1.7–7.7)
Neutrophils Relative %: 66 %
Platelets: 323 10*3/uL (ref 150–400)
RBC: 5.27 MIL/uL (ref 4.22–5.81)
RDW: 13.9 % (ref 11.5–15.5)
WBC: 12.3 10*3/uL — ABNORMAL HIGH (ref 4.0–10.5)

## 2016-04-28 LAB — URINALYSIS, ROUTINE W REFLEX MICROSCOPIC
Bacteria, UA: NONE SEEN
Bilirubin Urine: NEGATIVE
Glucose, UA: >=500 mg/dL — AB
Hgb urine dipstick: NEGATIVE
Ketones, ur: NEGATIVE mg/dL
Leukocytes, UA: NEGATIVE
Nitrite: NEGATIVE
Protein, ur: NEGATIVE mg/dL
Specific Gravity, Urine: 1.024 (ref 1.005–1.030)
Squamous Epithelial / LPF: NONE SEEN
pH: 6 (ref 5.0–8.0)

## 2016-04-28 LAB — COMPREHENSIVE METABOLIC PANEL
ALT: 22 U/L (ref 17–63)
AST: 24 U/L (ref 15–41)
Albumin: 4.2 g/dL (ref 3.5–5.0)
Alkaline Phosphatase: 51 U/L (ref 38–126)
Anion gap: 9 (ref 5–15)
BUN: 13 mg/dL (ref 6–20)
CO2: 29 mmol/L (ref 22–32)
Calcium: 9.6 mg/dL (ref 8.9–10.3)
Chloride: 98 mmol/L — ABNORMAL LOW (ref 101–111)
Creatinine, Ser: 0.9 mg/dL (ref 0.61–1.24)
GFR calc Af Amer: >60 mL/min (ref >60)
GFR calc non Af Amer: >60 mL/min (ref >60)
Glucose, Bld: 320 mg/dL — ABNORMAL HIGH (ref 65–99)
Potassium: 3.8 mmol/L (ref 3.5–5.1)
Sodium: 136 mmol/L (ref 135–145)
Total Bilirubin: 0.9 mg/dL (ref 0.3–1.2)
Total Protein: 7.8 g/dL (ref 6.5–8.1)

## 2016-04-28 LAB — PROTIME-INR
INR: 0.88
Prothrombin Time: 11.9 seconds (ref 11.4–15.2)

## 2016-04-28 LAB — SURGICAL PCR SCREEN
MRSA, PCR: NEGATIVE
Staphylococcus aureus: NEGATIVE

## 2016-04-28 LAB — GLUCOSE, CAPILLARY: GLUCOSE-CAPILLARY: 337 mg/dL — AB (ref 65–99)

## 2016-04-28 NOTE — Progress Notes (Signed)
Clearance- Dr Dwyane Dee on chart.

## 2016-04-28 NOTE — Progress Notes (Signed)
CMP UA and CBCw/diff done 04/28/16. Faxed via epic to Dr. Gladstone Lighter.

## 2016-04-28 NOTE — Progress Notes (Signed)
DR Lissa Hoard ( anesthesia) made aware of glucose of 330 at preop appt.  Patient stated he had not taken diabetic medications today.  Made aware of hgba1cin 02/2016.  Will awaithgba1c results from today and notify anesthesia on 04/29/16 of hgb a1c results.  CMP results from 04/28/16 havebeen routed to surgeon which DR Lissa Hoard is aware.

## 2016-04-28 NOTE — Progress Notes (Signed)
Patient scored a " 5" on the STOP BANG Assessment Tool for Obstructive Sleep Apnea during preop visit.  Patient is considered at high risk for obstructive sleep apnea using this tool.[name].

## 2016-04-29 LAB — HEMOGLOBIN A1C
HEMOGLOBIN A1C: 10 % — AB (ref 4.8–5.6)
MEAN PLASMA GLUCOSE: 240 mg/dL

## 2016-04-29 NOTE — Progress Notes (Signed)
hga1c done 04/28/2016 faxed via EPIC to Dr Gladstone Lighter.

## 2016-04-29 NOTE — Progress Notes (Signed)
DR Kalman Shan ( anesthesia) made aware hgba1c done 04/28/2016- 10.0.  Dr Kalman Shan made aware of clearance on chart from Dr Dwyane Dee and preop diabetic instructions given to patient on preop appointment.  No further instructions or orders given by Dr Kalman Shan.

## 2016-04-30 ENCOUNTER — Encounter: Payer: Self-pay | Admitting: Urgent Care

## 2016-05-04 ENCOUNTER — Ambulatory Visit (INDEPENDENT_AMBULATORY_CARE_PROVIDER_SITE_OTHER): Payer: BLUE CROSS/BLUE SHIELD | Admitting: Family Medicine

## 2016-05-04 VITALS — BP 126/88 | HR 90 | Temp 98.7°F | Resp 16 | Ht 75.0 in | Wt 337.8 lb

## 2016-05-04 DIAGNOSIS — F341 Dysthymic disorder: Secondary | ICD-10-CM

## 2016-05-04 DIAGNOSIS — G629 Polyneuropathy, unspecified: Secondary | ICD-10-CM | POA: Diagnosis not present

## 2016-05-04 DIAGNOSIS — E1142 Type 2 diabetes mellitus with diabetic polyneuropathy: Secondary | ICD-10-CM | POA: Diagnosis not present

## 2016-05-04 DIAGNOSIS — F4310 Post-traumatic stress disorder, unspecified: Secondary | ICD-10-CM | POA: Diagnosis not present

## 2016-05-04 MED ORDER — BUPROPION HCL ER (SR) 100 MG PO TB12: 100.0000 mg | ORAL_TABLET | Freq: Two times a day (BID) | ORAL | 0 refills | Status: DC

## 2016-05-04 MED ORDER — VANCOMYCIN HCL 10 G IV SOLR
1500.0000 mg | INTRAVENOUS | Status: AC
  Administered 2016-05-05: 1500 mg via INTRAVENOUS
  Filled 2016-05-04 (×2): qty 1500

## 2016-05-04 MED ORDER — PREGABALIN 75 MG PO CAPS: 75.0000 mg | ORAL_CAPSULE | Freq: Three times a day (TID) | ORAL | 0 refills | Status: DC | PRN

## 2016-05-04 MED ORDER — TRAZODONE HCL 150 MG PO TABS: 150.0000 mg | ORAL_TABLET | Freq: Every day | ORAL | 3 refills | Status: DC

## 2016-05-04 NOTE — Patient Instructions (Addendum)
Depression-Wellbutrin 100 mg twice daily.  Resume Trazadone at bedtime. Neuropathy-Stop Gabapentin. Start Lyrica 100 mg may take up to 3 times daily as needed for neuropathic pain,  Schedule a follow-up in 4 weeks for depression and neuropathy recheck.  [name]. Kenton Kingfisher, MSN, FNP-C [facility]  IF you received an x-ray today, you will receive an invoice from [facility] Radiology. Please contact [facility]iology at [phone] with questions or concerns regarding your invoice.   IF you received labwork today, you will receive an invoice from [facility]. Please contact [facility] at [phone] with questions or concerns regarding your invoice.   Our billing staff will not be able to assist you with questions regarding bills from these companies.  You will be contacted with the lab results as soon as they are available. The fastest way to get your results is to activate your My Chart account. Instructions are located on the last page of this paperwork. If you have not heard from [place] regarding the results in 2 weeks, please contact this office.

## 2016-05-04 NOTE — Anesthesia Preprocedure Evaluation (Addendum)
Anesthesia Evaluation  Patient identified by MRN, date of birth, ID band Patient awake    Reviewed: Allergy & Precautions, H&P , NPO status , Patient's Chart, lab work & pertinent test results  Airway Mallampati: III  TM Distance: >3 FB Neck ROM: Full    Dental no notable dental hx. (+) Teeth Intact, Dental Advisory Given   Pulmonary neg pulmonary ROS, former smoker,    Pulmonary exam normal breath sounds clear to auscultation       Cardiovascular Exercise Tolerance: Good hypertension, Pt. on medications  Rhythm:Regular Rate:Normal     Neuro/Psych Anxiety Depression negative neurological ROS  negative psych ROS   GI/Hepatic Neg liver ROS, GERD  Medicated and Controlled,  Endo/Other  diabetes, Insulin Dependent, Oral Hypoglycemic AgentsMorbid obesity  Renal/GU negative Renal ROS  negative genitourinary   Musculoskeletal   Abdominal   Peds  Hematology negative hematology ROS (+)   Anesthesia Other Findings   Reproductive/Obstetrics negative OB ROS                            Anesthesia Physical Anesthesia Plan  ASA: III  Anesthesia Plan: General   Post-op Pain Management:    Induction: Intravenous  Airway Management Planned: Oral ETT  Additional Equipment:   Intra-op Plan:   Post-operative Plan: Extubation in OR  Informed Consent: I have reviewed the patients History and Physical, chart, labs and discussed the procedure including the risks, benefits and alternatives for the proposed anesthesia with the patient or authorized representative who has indicated his/her understanding and acceptance.   Dental advisory given  Plan Discussed with: CRNA  Anesthesia Plan Comments:         Anesthesia Quick Evaluation

## 2016-05-04 NOTE — Progress Notes (Signed)
Patient ID: [name], male    DOB: 01/03/1981, 35 y.o.   MRN: [id number]  PCP: Kathlen Brunswick, PA-C  Chief Complaint  Patient presents with  . Follow-up    neuropathy   . Depression    Subjective:  HPI New Patient  35 year old male,presents for follow-up of neuropathy and depression. Chronic problems include: Type 2 Insulin Dependent diabetes, Dysthymia, PTSD and Chronic low back pain resulting from a work related injury. Neuropathy Pt was seen and evaluated for chronic bilateral diabetic foot neuropathy on 03/23/2016. During that visit, he reported that the Gabapentin dosage he was taking was no longer working. At that visit, the Gabapentin was increased to to 600 mg TID. He reports today, that the increased dose of Gabapentin did not touch the pain.  Depression  Mostly related an injury that he suffered back in April 2017. 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 He has been out of work and increasingly more depressed. He is married with 1 newborn and 22 yr old sons. 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  This profession is all he has ever done and he feels uncertain as to which career he will able to easily transition into. To add on to the depression, he is anxious about his back surgery that he has scheduled for tomorrow morning that is estimated to keep in the hospital for 2-3 days.  Social History   Social History  . Marital status: Married    Spouse name: N/A  . Number of children: N/A  . Years of education: N/A   Occupational History  . Not on file.   Social History Main Topics  . Smoking status: Former Smoker    Types: Cigars  . Smokeless tobacco: Former Systems developer    Types: Snuff    Quit date: 04/14/2016  . Alcohol use Yes     Comment: few times year  . Drug use: No  . Sexual  activity: Not on file   Other Topics Concern  . Not on file   Social History Narrative  . No narrative on file   No family history on file.  Review of Systems  SEE HPI Patient Active Problem List   Diagnosis Date Noted  . Left epididymitis 03/16/2016  . Dysthymia 09/02/2015  . Chronic low back pain 09/02/2015  . T2DM (type 2 diabetes mellitus) ([place]) 03/11/2008  . UNSPECIFIED ANEMIA 03/11/2008      Prior to Admission medications   Medication Sig Start Date End Date Taking? Authorizing Provider  acetaminophen (TYLENOL) 500 MG tablet Take 1,000 mg by mouth every 6 (six) hours as needed for mild pain.   Yes Historical Provider, MD  aspirin EC 81 MG tablet Take 1 tablet (81 mg total) by mouth daily. 07/11/14  Yes Shawnee Knapp, MD  B-D UF III MINI PEN NEEDLES 31G X 5 MM MISC USE AS DIRECTED EVERY DAY 02/24/16  Yes Timmothy Euler, MD  BAYER MICROLET LANCETS lancets Use as instructed to check blood sugar 4-5 times a week Dx code E11.42 01/21/16  Yes Elayne Snare, MD  blood glucose meter kit and supplies KIT Dispense based on insurance preference. Use up to four times daily as directed. DX: E11.42 11/27/15  Yes Mancel Bale, PA-C  Blood Glucose Monitoring Suppl (CONTOUR  NEXT EZ MONITOR) w/Device KIT Use to check blood sugar 4-5 times a week Dx code E11.42 01/21/16  Yes Elayne Snare, MD  chlorthalidone (HYGROTON) 25 MG tablet Take 1 tablet (25 mg total) by mouth daily. 10/23/15  Yes Robyn Haber, MD  diphenhydrAMINE (BENADRYL) 25 mg capsule Take 25-50 mg by mouth daily as needed for allergies (DEPENDS ON HOW BAD ALLERGIES IF TAKES 25-50 MG).   Yes Historical Provider, MD  fenofibrate (TRICOR) 145 MG tablet Take 1 tablet (145 mg total) by mouth daily. 02/27/16  Yes Elayne Snare, MD  fluconazole (DIFLUCAN) 150 MG tablet Take 150 mg by mouth every 14 (fourteen) days.   Yes Historical Provider, MD  gabapentin (NEURONTIN) 600 MG tablet Take 1 tablet (600 mg total) by mouth 3 (three) times daily.  03/23/16  Yes Jaynee Eagles, PA-C  glucose blood (BAYER CONTOUR NEXT TEST) test strip Use as instructed to check blood sugar 4-5 times per week dx code E11.42 01/21/16  Yes Elayne Snare, MD  ibuprofen (ADVIL,MOTRIN) 200 MG tablet Take 800 mg by mouth every 6 (six) hours as needed for mild pain.   Yes Historical Provider, MD  insulin aspart (NOVOLOG) 100 UNIT/ML FlexPen Inject 10 Units into the skin 3 (three) times daily with meals. 03/31/16  Yes Elayne Snare, MD  Insulin Glargine (LANTUS SOLOSTAR) 100 UNIT/ML Solostar Pen Inject 10 units twice a day Patient taking differently: 70 Units 2 (two) times daily. Patient takes am and supper 03/09/16  Yes Elayne Snare, MD  INVOKANA 300 MG TABS tablet TAKE 1 TABLET BY MOUTH EVERY DAY 04/21/16  Yes Timmothy Euler, MD  metFORMIN (GLUCOPHAGE-XR) 500 MG 24 hr tablet Take 4 tablets (2,000 mg total) by mouth daily with supper. 01/21/16  Yes Elayne Snare, MD  ranitidine (ZANTAC) 150 MG tablet Take 1 tablet (150 mg total) by mouth 2 (two) times daily. 09/11/14  Yes Dorian Heckle English, PA  traZODone (DESYREL) 150 MG tablet TAKE 1 TABLET BY MOUTH AT BEDTIME 02/15/16  Yes Robyn Haber, MD    Past Medical, Surgical Family and Social History reviewed and updated.    Objective:   Today's Vitals   05/04/16 0913  BP: 126/88  Pulse: 90  Resp: 16  Temp: 98.7 F (37.1 C)  TempSrc: Oral  SpO2: 99%  Weight: (!) 337 lb 12.8 oz (153.2 kg)  Height: 6' 3"  (1.905 m)    Wt Readings from Last 3 Encounters:  05/04/16 (!) 337 lb 12.8 oz (153.2 kg)  04/28/16 (!) 330 lb (149.7 kg)  03/23/16 (!) 331 lb (150.1 kg)   Physical Exam  Constitutional: He is oriented to person, place, and time. He appears well-developed and well-nourished.  HENT:  Head: Normocephalic and atraumatic.  Right Ear: External ear normal.  Left Ear: External ear normal.  Mouth/Throat: Oropharynx is clear and moist.  Eyes: Conjunctivae are normal. Pupils are equal, round, and reactive to light.  Neck:  Normal range of motion.  Cardiovascular: Normal rate, regular rhythm, normal heart sounds and intact distal pulses.   Pulmonary/Chest: Effort normal and breath sounds normal.  Musculoskeletal: Normal range of motion.  Neurological: He is alert and oriented to person, place, and time.  Skin: Skin is warm and dry.  Psychiatric: His behavior is normal. Judgment and thought content normal. His mood appears anxious. Cognition and memory are normal. He exhibits a depressed mood.  Very tearful when discussing overall health condition and ending career as a paramedic. Fearful of surgery and possible outcomes.  Assessment & Plan:  1. Type 2 diabetes mellitus with diabetic polyneuropathy, without long-term current use of insulin ([place]) 2. Neuropathy (HCC)-Discontinued Gabapentin.  Start Lyrica 75 mg up to 3 times daily as needed   3. PTSD (post-traumatic stress disorder) TraZODone (DESYREL) 150 MG tablet; Take 1 tablet by mouth at bedtime.    4. Dysthymia -Start Wellbutrin 100 mg SR, 1 tablet, once daily.  Carroll Sage. Kenton Kingfisher, MSN, FNP-C [facility]

## 2016-05-05 ENCOUNTER — Encounter (HOSPITAL_COMMUNITY): Payer: Self-pay | Admitting: *Deleted

## 2016-05-05 ENCOUNTER — Ambulatory Visit (HOSPITAL_COMMUNITY): Payer: BLUE CROSS/BLUE SHIELD

## 2016-05-05 ENCOUNTER — Observation Stay (HOSPITAL_COMMUNITY)
Admission: RE | Admit: 2016-05-05 | Discharge: 2016-05-07 | Disposition: A | Discharge status: Home / Self Care | Payer: BLUE CROSS/BLUE SHIELD | Source: Ambulatory Visit | Attending: Orthopedic Surgery | Admitting: Orthopedic Surgery

## 2016-05-05 ENCOUNTER — Encounter (HOSPITAL_COMMUNITY)
Admission: RE | Disposition: A | Discharge status: Home / Self Care | Payer: Self-pay | Source: Ambulatory Visit | Attending: Orthopedic Surgery

## 2016-05-05 ENCOUNTER — Ambulatory Visit (HOSPITAL_COMMUNITY): Payer: BLUE CROSS/BLUE SHIELD | Admitting: Anesthesiology

## 2016-05-05 DIAGNOSIS — Z7982 Long term (current) use of aspirin: Secondary | ICD-10-CM | POA: Diagnosis not present

## 2016-05-05 DIAGNOSIS — Z6841 Body Mass Index (BMI) 40.0 and over, adult: Secondary | ICD-10-CM | POA: Insufficient documentation

## 2016-05-05 DIAGNOSIS — E1140 Type 2 diabetes mellitus with diabetic neuropathy, unspecified: Secondary | ICD-10-CM | POA: Diagnosis not present

## 2016-05-05 DIAGNOSIS — I10 Essential (primary) hypertension: Secondary | ICD-10-CM | POA: Insufficient documentation

## 2016-05-05 DIAGNOSIS — E1142 Type 2 diabetes mellitus with diabetic polyneuropathy: Secondary | ICD-10-CM

## 2016-05-05 DIAGNOSIS — K219 Gastro-esophageal reflux disease without esophagitis: Secondary | ICD-10-CM | POA: Diagnosis not present

## 2016-05-05 DIAGNOSIS — F329 Major depressive disorder, single episode, unspecified: Secondary | ICD-10-CM | POA: Diagnosis not present

## 2016-05-05 DIAGNOSIS — F4310 Post-traumatic stress disorder, unspecified: Secondary | ICD-10-CM | POA: Diagnosis not present

## 2016-05-05 DIAGNOSIS — F419 Anxiety disorder, unspecified: Secondary | ICD-10-CM | POA: Diagnosis not present

## 2016-05-05 DIAGNOSIS — M48062 Spinal stenosis, lumbar region with neurogenic claudication: Secondary | ICD-10-CM | POA: Diagnosis present

## 2016-05-05 DIAGNOSIS — M21372 Foot drop, left foot: Secondary | ICD-10-CM | POA: Diagnosis not present

## 2016-05-05 DIAGNOSIS — Z419 Encounter for procedure for purposes other than remedying health state, unspecified: Secondary | ICD-10-CM

## 2016-05-05 DIAGNOSIS — Z87891 Personal history of nicotine dependence: Secondary | ICD-10-CM | POA: Diagnosis not present

## 2016-05-05 DIAGNOSIS — Z794 Long term (current) use of insulin: Secondary | ICD-10-CM | POA: Insufficient documentation

## 2016-05-05 DIAGNOSIS — Z880 Allergy status to penicillin: Secondary | ICD-10-CM | POA: Insufficient documentation

## 2016-05-05 HISTORY — PX: LUMBAR LAMINECTOMY/DECOMPRESSION MICRODISCECTOMY: SHX5026

## 2016-05-05 LAB — GLUCOSE, CAPILLARY
GLUCOSE-CAPILLARY: 227 mg/dL — AB (ref 65–99)
GLUCOSE-CAPILLARY: 181 mg/dL — AB (ref 65–99)
GLUCOSE-CAPILLARY: 245 mg/dL — AB (ref 65–99)
Glucose-Capillary: 202 mg/dL — ABNORMAL HIGH (ref 65–99)
Glucose-Capillary: 237 mg/dL — ABNORMAL HIGH (ref 65–99)

## 2016-05-05 LAB — TYPE AND SCREEN
ABO/RH(D): O POS
Antibody Screen: NEGATIVE

## 2016-05-05 SURGERY — LUMBAR LAMINECTOMY/DECOMPRESSION MICRODISCECTOMY
Anesthesia: General | Site: Back | Laterality: Left

## 2016-05-05 MED ORDER — SUGAMMADEX SODIUM 500 MG/5ML IV SOLN
INTRAVENOUS | Status: AC
  Filled 2016-05-05: qty 5

## 2016-05-05 MED ORDER — LIDOCAINE-EPINEPHRINE (PF) 1 %-1:200000 IJ SOLN
INTRAMUSCULAR | Status: AC
  Filled 2016-05-05: qty 30

## 2016-05-05 MED ORDER — SODIUM CHLORIDE 0.9 % IV SOLN
INTRAVENOUS | Status: DC | PRN
  Administered 2016-05-05: 50 ug/min via INTRAVENOUS

## 2016-05-05 MED ORDER — PROPOFOL 10 MG/ML IV BOLUS
INTRAVENOUS | Status: AC
  Filled 2016-05-05: qty 40

## 2016-05-05 MED ORDER — ACETAMINOPHEN 10 MG/ML IV SOLN
INTRAVENOUS | Status: AC
  Filled 2016-05-05: qty 100

## 2016-05-05 MED ORDER — LACTATED RINGERS IV SOLN: INTRAVENOUS | Status: DC

## 2016-05-05 MED ORDER — SODIUM CHLORIDE 0.9 % IR SOLN
Status: AC
  Filled 2016-05-05: qty 500000

## 2016-05-05 MED ORDER — SUFENTANIL CITRATE 50 MCG/ML IV SOLN
INTRAVENOUS | Status: DC | PRN
  Administered 2016-05-05: 20 ug via INTRAVENOUS
  Administered 2016-05-05: 5 ug via INTRAVENOUS
  Administered 2016-05-05: 10 ug via INTRAVENOUS

## 2016-05-05 MED ORDER — ACETAMINOPHEN 650 MG RE SUPP
650.0000 mg | RECTAL | Status: DC | PRN
Start: 2016-05-05 — End: 2016-05-07

## 2016-05-05 MED ORDER — SODIUM CHLORIDE 0.9 % IJ SOLN
INTRAMUSCULAR | Status: AC
  Filled 2016-05-05: qty 10

## 2016-05-05 MED ORDER — ACETAMINOPHEN 325 MG PO TABS
650.0000 mg | ORAL_TABLET | ORAL | Status: DC | PRN
Start: 2016-05-05 — End: 2016-05-07

## 2016-05-05 MED ORDER — FLUCONAZOLE 150 MG PO TABS: 150.0000 mg | ORAL_TABLET | ORAL | Status: DC

## 2016-05-05 MED ORDER — SODIUM CHLORIDE 0.9 % IR SOLN
Status: DC | PRN
  Administered 2016-05-05: 500 mL

## 2016-05-05 MED ORDER — BISACODYL 5 MG PO TBEC: 5.0000 mg | DELAYED_RELEASE_TABLET | Freq: Every day | ORAL | Status: DC | PRN

## 2016-05-05 MED ORDER — SUFENTANIL CITRATE 50 MCG/ML IV SOLN
INTRAVENOUS | Status: AC
  Filled 2016-05-05: qty 1

## 2016-05-05 MED ORDER — CANAGLIFLOZIN 300 MG PO TABS
300.0000 mg | ORAL_TABLET | Freq: Every day | ORAL | Status: DC
  Administered 2016-05-06 – 2016-05-07 (×2): 300 mg via ORAL
  Filled 2016-05-05 (×2): qty 1

## 2016-05-05 MED ORDER — LABETALOL HCL 5 MG/ML IV SOLN
INTRAVENOUS | Status: DC | PRN
  Administered 2016-05-05 (×2): 5 mg via INTRAVENOUS

## 2016-05-05 MED ORDER — LACTATED RINGERS IV SOLN
INTRAVENOUS | Status: DC | PRN
  Administered 2016-05-05 (×2): via INTRAVENOUS

## 2016-05-05 MED ORDER — LIDOCAINE-EPINEPHRINE (PF) 1 %-1:200000 IJ SOLN
INTRAMUSCULAR | Status: DC | PRN
  Administered 2016-05-05: 20 mL

## 2016-05-05 MED ORDER — MENTHOL 3 MG MT LOZG: 1.0000 | LOZENGE | OROMUCOSAL | Status: DC | PRN

## 2016-05-05 MED ORDER — PHENYLEPHRINE 40 MCG/ML (10ML) SYRINGE FOR IV PUSH (FOR BLOOD PRESSURE SUPPORT)
PREFILLED_SYRINGE | INTRAVENOUS | Status: AC
  Filled 2016-05-05: qty 10

## 2016-05-05 MED ORDER — ONDANSETRON HCL 4 MG/2ML IJ SOLN: 4.0000 mg | INTRAMUSCULAR | Status: DC | PRN

## 2016-05-05 MED ORDER — LIDOCAINE 2% (20 MG/ML) 5 ML SYRINGE
INTRAMUSCULAR | Status: AC
  Filled 2016-05-05: qty 5

## 2016-05-05 MED ORDER — PROPOFOL 10 MG/ML IV BOLUS
INTRAVENOUS | Status: DC | PRN
  Administered 2016-05-05: 250 mg via INTRAVENOUS

## 2016-05-05 MED ORDER — FLEET ENEMA 7-19 GM/118ML RE ENEM: 1.0000 | ENEMA | Freq: Once | RECTAL | Status: DC | PRN

## 2016-05-05 MED ORDER — METHOCARBAMOL 500 MG PO TABS
500.0000 mg | ORAL_TABLET | Freq: Four times a day (QID) | ORAL | Status: DC | PRN
  Administered 2016-05-06 – 2016-05-07 (×4): 500 mg via ORAL
  Filled 2016-05-05 (×5): qty 1

## 2016-05-05 MED ORDER — METHOCARBAMOL 500 MG PO TABS: 500.0000 mg | ORAL_TABLET | Freq: Four times a day (QID) | ORAL | 1 refills | Status: DC | PRN

## 2016-05-05 MED ORDER — POLYETHYLENE GLYCOL 3350 17 G PO PACK: 17.0000 g | PACK | Freq: Every day | ORAL | Status: DC | PRN

## 2016-05-05 MED ORDER — HYDROMORPHONE HCL 1 MG/ML IJ SOLN
0.5000 mg | INTRAMUSCULAR | Status: DC | PRN
  Administered 2016-05-05 – 2016-05-06 (×4): 1 mg via INTRAVENOUS
  Filled 2016-05-05 (×4): qty 1

## 2016-05-05 MED ORDER — BUPIVACAINE LIPOSOME 1.3 % IJ SUSP
INTRAMUSCULAR | Status: DC | PRN
  Administered 2016-05-05: 20 mL

## 2016-05-05 MED ORDER — SUCCINYLCHOLINE CHLORIDE 200 MG/10ML IV SOSY
PREFILLED_SYRINGE | INTRAVENOUS | Status: DC | PRN
  Administered 2016-05-05: 200 mg via INTRAVENOUS

## 2016-05-05 MED ORDER — PHENYLEPHRINE 40 MCG/ML (10ML) SYRINGE FOR IV PUSH (FOR BLOOD PRESSURE SUPPORT)
PREFILLED_SYRINGE | INTRAVENOUS | Status: DC | PRN
  Administered 2016-05-05 (×5): 80 ug via INTRAVENOUS

## 2016-05-05 MED ORDER — DEXAMETHASONE SODIUM PHOSPHATE 10 MG/ML IJ SOLN
INTRAMUSCULAR | Status: AC
  Filled 2016-05-05: qty 1

## 2016-05-05 MED ORDER — BACITRACIN-NEOMYCIN-POLYMYXIN 400-5-5000 EX OINT
TOPICAL_OINTMENT | CUTANEOUS | Status: DC | PRN
  Administered 2016-05-05: 1 via TOPICAL

## 2016-05-05 MED ORDER — MIDAZOLAM HCL 2 MG/2ML IJ SOLN
INTRAMUSCULAR | Status: AC
  Filled 2016-05-05: qty 2

## 2016-05-05 MED ORDER — BUPIVACAINE LIPOSOME 1.3 % IJ SUSP
20.0000 mL | Freq: Once | INTRAMUSCULAR | Status: DC
  Filled 2016-05-05: qty 20

## 2016-05-05 MED ORDER — ROCURONIUM BROMIDE 50 MG/5ML IV SOSY
PREFILLED_SYRINGE | INTRAVENOUS | Status: AC
  Filled 2016-05-05: qty 5

## 2016-05-05 MED ORDER — OXYCODONE-ACETAMINOPHEN 5-325 MG PO TABS: 1.0000 | ORAL_TABLET | ORAL | 0 refills | Status: DC | PRN

## 2016-05-05 MED ORDER — TRAZODONE HCL 50 MG PO TABS
150.0000 mg | ORAL_TABLET | Freq: Every day | ORAL | Status: DC
  Filled 2016-05-05: qty 3

## 2016-05-05 MED ORDER — HYDROMORPHONE HCL 1 MG/ML IJ SOLN
0.2500 mg | INTRAMUSCULAR | Status: DC | PRN
  Administered 2016-05-05 (×3): 0.5 mg via INTRAVENOUS

## 2016-05-05 MED ORDER — INSULIN ASPART 100 UNIT/ML ~~LOC~~ SOLN
0.0000 [IU] | Freq: Three times a day (TID) | SUBCUTANEOUS | Status: DC
  Administered 2016-05-05 – 2016-05-06 (×3): 7 [IU] via SUBCUTANEOUS
  Administered 2016-05-06: 4 [IU] via SUBCUTANEOUS
  Administered 2016-05-07: 7 [IU] via SUBCUTANEOUS

## 2016-05-05 MED ORDER — PREGABALIN 75 MG PO CAPS
75.0000 mg | ORAL_CAPSULE | Freq: Every day | ORAL | Status: DC
  Administered 2016-05-05 – 2016-05-07 (×3): 75 mg via ORAL
  Filled 2016-05-05 (×3): qty 1

## 2016-05-05 MED ORDER — PHENOL 1.4 % MT LIQD
1.0000 | OROMUCOSAL | Status: DC | PRN
  Filled 2016-05-05: qty 177

## 2016-05-05 MED ORDER — BACITRACIN-NEOMYCIN-POLYMYXIN 400-5-5000 EX OINT
TOPICAL_OINTMENT | CUTANEOUS | Status: AC
  Filled 2016-05-05: qty 1

## 2016-05-05 MED ORDER — HYDROMORPHONE HCL 1 MG/ML IJ SOLN
INTRAMUSCULAR | Status: AC
  Administered 2016-05-06: 1 mg via INTRAVENOUS
  Filled 2016-05-05: qty 1

## 2016-05-05 MED ORDER — MIDAZOLAM HCL 2 MG/2ML IJ SOLN
INTRAMUSCULAR | Status: DC | PRN
  Administered 2016-05-05 (×2): 1 mg via INTRAVENOUS

## 2016-05-05 MED ORDER — ONDANSETRON HCL 4 MG/2ML IJ SOLN
INTRAMUSCULAR | Status: DC | PRN
  Administered 2016-05-05: 4 mg via INTRAVENOUS

## 2016-05-05 MED ORDER — ACETAMINOPHEN 10 MG/ML IV SOLN
INTRAVENOUS | Status: DC | PRN
  Administered 2016-05-05 (×2): 1000 mg via INTRAVENOUS

## 2016-05-05 MED ORDER — DEXTROSE 5 % IV SOLN
500.0000 mg | Freq: Four times a day (QID) | INTRAVENOUS | Status: DC | PRN
  Administered 2016-05-05: 500 mg via INTRAVENOUS
  Filled 2016-05-05: qty 550
  Filled 2016-05-05: qty 5

## 2016-05-05 MED ORDER — FENOFIBRATE 54 MG PO TABS
54.0000 mg | ORAL_TABLET | Freq: Every day | ORAL | Status: DC
  Administered 2016-05-05 – 2016-05-07 (×3): 54 mg via ORAL
  Filled 2016-05-05 (×3): qty 1

## 2016-05-05 MED ORDER — PHENYLEPHRINE HCL 10 MG/ML IJ SOLN
INTRAMUSCULAR | Status: AC
  Filled 2016-05-05: qty 1

## 2016-05-05 MED ORDER — VANCOMYCIN HCL IN DEXTROSE 1-5 GM/200ML-% IV SOLN
1000.0000 mg | Freq: Once | INTRAVENOUS | Status: AC
  Administered 2016-05-05: 18:00:00 1000 mg via INTRAVENOUS
  Filled 2016-05-05 (×2): qty 200

## 2016-05-05 MED ORDER — ONDANSETRON HCL 4 MG/2ML IJ SOLN
INTRAMUSCULAR | Status: AC
  Filled 2016-05-05: qty 2

## 2016-05-05 MED ORDER — SUCCINYLCHOLINE CHLORIDE 200 MG/10ML IV SOSY
PREFILLED_SYRINGE | INTRAVENOUS | Status: AC
  Filled 2016-05-05: qty 20

## 2016-05-05 MED ORDER — CHLORTHALIDONE 25 MG PO TABS
25.0000 mg | ORAL_TABLET | Freq: Every day | ORAL | Status: DC
  Administered 2016-05-05 – 2016-05-07 (×3): 25 mg via ORAL
  Filled 2016-05-05 (×3): qty 1

## 2016-05-05 MED ORDER — OXYCODONE-ACETAMINOPHEN 5-325 MG PO TABS
1.0000 | ORAL_TABLET | ORAL | Status: DC | PRN
  Administered 2016-05-06 – 2016-05-07 (×5): 2 via ORAL
  Filled 2016-05-05 (×6): qty 2

## 2016-05-05 MED ORDER — HYDROCODONE-ACETAMINOPHEN 5-325 MG PO TABS
1.0000 | ORAL_TABLET | ORAL | Status: DC | PRN
  Administered 2016-05-05: 1 via ORAL
  Administered 2016-05-05 – 2016-05-07 (×5): 2 via ORAL
  Filled 2016-05-05: qty 2
  Filled 2016-05-05: qty 1
  Filled 2016-05-05 (×4): qty 2

## 2016-05-05 MED ORDER — SUGAMMADEX SODIUM 200 MG/2ML IV SOLN
INTRAVENOUS | Status: DC | PRN
  Administered 2016-05-05: 400 mg via INTRAVENOUS

## 2016-05-05 MED ORDER — BUPROPION HCL ER (SR) 100 MG PO TB12
100.0000 mg | ORAL_TABLET | Freq: Two times a day (BID) | ORAL | Status: DC
  Administered 2016-05-05 – 2016-05-07 (×5): 100 mg via ORAL
  Filled 2016-05-05 (×5): qty 1

## 2016-05-05 MED ORDER — LACTATED RINGERS IV SOLN
INTRAVENOUS | Status: DC
  Administered 2016-05-05: 06:00:00 via INTRAVENOUS

## 2016-05-05 MED ORDER — LIDOCAINE 2% (20 MG/ML) 5 ML SYRINGE
INTRAMUSCULAR | Status: DC | PRN
  Administered 2016-05-05: 100 mg via INTRAVENOUS

## 2016-05-05 MED ORDER — ROCURONIUM BROMIDE 10 MG/ML (PF) SYRINGE
PREFILLED_SYRINGE | INTRAVENOUS | Status: DC | PRN
  Administered 2016-05-05: 50 mg via INTRAVENOUS
  Administered 2016-05-05: 20 mg via INTRAVENOUS
  Administered 2016-05-05: 10 mg via INTRAVENOUS
  Administered 2016-05-05: 20 mg via INTRAVENOUS

## 2016-05-05 MED ORDER — HYDROMORPHONE HCL 1 MG/ML IJ SOLN
INTRAMUSCULAR | Status: AC
  Filled 2016-05-05: qty 1

## 2016-05-05 SURGICAL SUPPLY — 48 items
AGENT HMST SPONGE THK3/8 (HEMOSTASIS) ×2
APL SKNCLS STERI-STRIP NONHPOA (GAUZE/BANDAGES/DRESSINGS) ×1
BAG SPEC THK2 15X12 ZIP CLS (MISCELLANEOUS)
BAG ZIPLOCK 12X15 (MISCELLANEOUS) IMPLANT
BENZOIN TINCTURE PRP APPL 2/3 (GAUZE/BANDAGES/DRESSINGS) ×2 IMPLANT
CLEANER TIP ELECTROSURG 2X2 (MISCELLANEOUS) ×2 IMPLANT
DRAIN PENROSE 18X1/4 LTX STRL (WOUND CARE) IMPLANT
DRAPE MICROSCOPE LEICA (MISCELLANEOUS) ×2 IMPLANT
DRAPE POUCH INSTRU U-SHP 10X18 (DRAPES) ×2 IMPLANT
DRAPE SHEET LG 3/4 BI-LAMINATE (DRAPES) ×2 IMPLANT
DRAPE SURG 17X11 SM STRL (DRAPES) ×2 IMPLANT
DRSG ADAPTIC 3X8 NADH LF (GAUZE/BANDAGES/DRESSINGS) ×2 IMPLANT
DRSG PAD ABDOMINAL 8X10 ST (GAUZE/BANDAGES/DRESSINGS) ×7 IMPLANT
DURAFORM SPONGE 2X2 SINGLE (Neuro Prosthesis/Implant) ×1 IMPLANT
DURAPREP 26ML APPLICATOR (WOUND CARE) ×2 IMPLANT
ELECT BLADE TIP CTD 4 INCH (ELECTRODE) ×2 IMPLANT
ELECT REM PT RETURN 9FT ADLT (ELECTROSURGICAL) ×2
ELECTRODE REM PT RTRN 9FT ADLT (ELECTROSURGICAL) ×1 IMPLANT
GAUZE SPONGE 4X4 12PLY STRL (GAUZE/BANDAGES/DRESSINGS) ×2 IMPLANT
GLOVE BIOGEL PI IND STRL 8 (GLOVE) ×1 IMPLANT
GLOVE BIOGEL PI INDICATOR 8 (GLOVE) ×1
GLOVE ECLIPSE 8.0 STRL XLNG CF (GLOVE) ×4 IMPLANT
GOWN STRL REUS W/TWL XL LVL3 (GOWN DISPOSABLE) ×4 IMPLANT
HEMOSTAT SPONGE AVITENE ULTRA (HEMOSTASIS) ×3 IMPLANT
KIT BASIN OR (CUSTOM PROCEDURE TRAY) ×2 IMPLANT
KIT POSITIONING SURG ANDREWS (MISCELLANEOUS) ×2 IMPLANT
MANIFOLD NEPTUNE II (INSTRUMENTS) ×2 IMPLANT
MARKER SKIN DUAL TIP RULER LAB (MISCELLANEOUS) ×2 IMPLANT
NDL SPNL 18GX3.5 QUINCKE PK (NEEDLE) ×2 IMPLANT
NEEDLE HYPO 22GX1.5 SAFETY (NEEDLE) ×4 IMPLANT
NEEDLE SPNL 18GX3.5 QUINCKE PK (NEEDLE) ×4 IMPLANT
PACK LAMINECTOMY ORTHO (CUSTOM PROCEDURE TRAY) ×2 IMPLANT
PATTIES SURGICAL .5 X.5 (GAUZE/BANDAGES/DRESSINGS) ×1 IMPLANT
PATTIES SURGICAL .75X.75 (GAUZE/BANDAGES/DRESSINGS) ×2 IMPLANT
PATTIES SURGICAL 1X1 (DISPOSABLE) ×2 IMPLANT
PIN SAFETY NICK PLATE  2 MED (MISCELLANEOUS)
PIN SAFETY NICK PLATE 2 MED (MISCELLANEOUS) IMPLANT
SPONGE LAP 4X18 X RAY DECT (DISPOSABLE) ×4 IMPLANT
STAPLER VISISTAT 35W (STAPLE) ×2 IMPLANT
SUT VIC AB 0 CT1 27 (SUTURE) ×2
SUT VIC AB 0 CT1 27XBRD ANTBC (SUTURE) ×1 IMPLANT
SUT VIC AB 1 CT1 27 (SUTURE) ×6
SUT VIC AB 1 CT1 27XBRD ANTBC (SUTURE) ×3 IMPLANT
SUT VIC AB 2-0 CT1 27 (SUTURE) ×2
SUT VIC AB 2-0 CT1 TAPERPNT 27 (SUTURE) IMPLANT
SYR 20CC LL (SYRINGE) ×4 IMPLANT
TOWEL OR 17X26 10 PK STRL BLUE (TOWEL DISPOSABLE) ×2 IMPLANT
TRAY FOLEY BAG SILVER LF 16FR (SET/KITS/TRAYS/PACK) ×1 IMPLANT

## 2016-05-05 NOTE — Anesthesia Procedure Notes (Signed)
Procedure Name: Intubation Date/Time: 05/05/2016 7:41 AM Performed by: Danley Danker L Patient Re-evaluated:Patient Re-evaluated prior to inductionOxygen Delivery Method: Circle system utilized Preoxygenation: Pre-oxygenation with 100% oxygen Intubation Type: IV induction Ventilation: Mask ventilation without difficulty and Oral airway inserted - appropriate to patient size Laryngoscope Size: Miller and 3 Grade View: Grade I Tube type: Oral Tube size: 8.0 mm Number of attempts: 1 Airway Equipment and Method: Stylet Placement Confirmation: ETT inserted through vocal cords under direct vision,  positive ETCO2 and breath sounds checked- equal and bilateral Secured at: 21 cm Tube secured with: Tape Dental Injury: Teeth and Oropharynx as per pre-operative assessment

## 2016-05-05 NOTE — Discharge Instructions (Signed)
For the first three days, remove your dressing, tape a piece of saran wrap over your incision Take your shower, then remove the saran wrap and put a clean dressing on. After three days you can shower without the saran wrap.  No driving while taking pain medication. No lifting or bending. Call Dr. Gladstone Lighter if any wound complications or temperature of 101 degrees F or over.  Call the office for an appointment to see Dr. Gladstone Lighter in two weeks: [phone] and ask for Dr. Charlestine Night nurse, Brunilda Payor.

## 2016-05-05 NOTE — Progress Notes (Signed)
CBG elevated and reported to CRNA Danley Danker and Dr Gladstone Lighter. CBG to be repeated per MD. (Pt to anesthesia preop holding with Vancomycin infusing)

## 2016-05-05 NOTE — Anesthesia Postprocedure Evaluation (Signed)
Anesthesia Post Note  Patient: LOTT BALADEZ  Procedure(s) Performed: Procedure(s) (LRB): CENTRAL DECOMPRESSION L4-L5 AND FORAMINOTOMY FOR L4 ROOT AND L5 ROOT ON THE LEFT (Left)  Patient location during evaluation: PACU Anesthesia Type: General Level of consciousness: awake and alert Pain management: pain level controlled Vital Signs Assessment: post-procedure vital signs reviewed and stable Respiratory status: spontaneous breathing, nonlabored ventilation, respiratory function stable and patient connected to nasal cannula oxygen Cardiovascular status: blood pressure returned to baseline and stable Postop Assessment: no signs of nausea or vomiting Anesthetic complications: no       Last Vitals:  Vitals:   05/05/16 1115 05/05/16 1130  BP: (!) 99/48 120/71  Pulse: (!) 107 (!) 106  Resp: 18 17  Temp:      Last Pain:  Vitals:   05/05/16 1015  TempSrc:   PainSc: Asleep                 Braedyn Riggle,W. EDMOND

## 2016-05-05 NOTE — Progress Notes (Addendum)
Inpatient Diabetes Program Recommendations  AACE/ADA: New Consensus Statement on Inpatient Glycemic Control (2015)  Target Ranges:  Prepandial:   less than 140 mg/dL      Peak postprandial:   less than 180 mg/dL (1-2 hours)      Critically ill patients:  140 - 180 mg/dL   Results for BRADELY, PESCH (MRN [id number]) as of 05/05/2016 15:42  Ref. Range 05/05/2016 05:08 05/05/2016 06:56 05/05/2016 10:20  Glucose-Capillary Latest Ref Range: 65 - 99 mg/dL 227 (H) 181 (H) 245 (H)    Admit with: Back Surgery  History: DM  Home DM Meds: Lantus 70 units BID       Novolog 10 units TID       Invokana 300 mg daily       Metformin 2000 mg daily       Victoza 1.8 mg daily  Current Insulin Orders: Invokana 300 mg daily      Novolog Resistant Correction Scale/ SSI (0-20 units) TID AC      -Note patient saw his Endocrinologist Dr. Elayne Snare with [facility] on 03/16/16.  Novolog 10 units TID with meals was started at that visit.  -Last A1c on file was 10% back at pre-op visit on 04/28/16.     MD- Please consider starting a portion of pt's home dose of Lantus post-op:  Recommend Lantus 35 units BID (this would be 50% of pt's home dose to start)     --Will follow patient during hospitalization--  Wyn Quaker RN, MSN, CDE Diabetes Coordinator Inpatient Glycemic Control Team Team Pager: [phone] (8a-5p)

## 2016-05-05 NOTE — Addendum Note (Signed)
Addendum  created 05/05/16 1221 by Sharlette Dense, CRNA   Charge Capture section accepted

## 2016-05-05 NOTE — Interval H&P Note (Signed)
History and Physical Interval Note:  05/05/2016 7:32 AM  [name]  has presented today for surgery, with the diagnosis of HNP and stenosis L4-L5 left  The various methods of treatment have been discussed with the patient and family. After consideration of risks, benefits and other options for treatment, the patient has consented to  Procedure(s): CENTRAL DECOMPRESSION L4-L5 AND MICRODISCECTOMY L4-L5 ON THE LEFT (Left) as a surgical intervention .  The patient's history has been reviewed, patient examined, no change in status, stable for surgery.  I have reviewed the patient's chart and labs.  Questions were answered to the patient's satisfaction.     Caralynn Gelber A

## 2016-05-05 NOTE — Evaluation (Signed)
Physical Therapy Evaluation Patient Details Name: [name] MRN: [id number] DOB: 24-Aug-1980 Today's Date: 05/05/2016   History of Present Illness  Pt s/p L4-5 decompression with hx of DM, PTSD and L partial drop foot  Clinical Impression  Pt s/p L4-5 decompression and presents with post op pain and back precautions limiting functional mobility.  Pt should progress to dc home with family assist.    Follow Up Recommendations No PT follow up    Equipment Recommendations  Rolling walker with 5" wheels    Recommendations for Other Services OT consult     Precautions / Restrictions Precautions Precautions: Back Restrictions Weight Bearing Restrictions: No      Mobility  Bed Mobility Overal bed mobility: Needs Assistance Bed Mobility: Supine to Sit     Supine to sit: Min guard     General bed mobility comments: multimodal cues for log roll technique and transition to sitting following back precautions  Transfers Overall transfer level: Needs assistance Equipment used: Rolling walker (2 wheeled) Transfers: Sit to/from Stand Sit to Stand: Min assist         General transfer comment: cues for transition position, use of UEs to self assist and adherence to back precautions  Ambulation/Gait Ambulation/Gait assistance: Min assist Ambulation Distance (Feet): 60 Feet Assistive device: Rolling walker (2 wheeled) Gait Pattern/deviations: Step-through pattern;Decreased step length - right;Decreased step length - left;Shuffle;Trunk flexed Gait velocity: decr Gait velocity interpretation: Below normal speed for age/gender General Gait Details: Increased time and cues for posture and position from [employer]            Wheelchair Mobility    Modified Rankin (Stroke Patients Only)       Balance                                             Pertinent Vitals/Pain Pain Assessment: 0-10 Pain Score: 6  Pain Location: back and L LE Pain  Descriptors / Indicators: Aching Pain Intervention(s): Limited activity within patient's tolerance;Monitored during session;Premedicated before session;Patient requesting pain meds-RN notified    Home Living Family/patient expects to be discharged to:: Private residence Living Arrangements: Spouse/significant other;Children Available Help at Discharge: Family Type of Home: House Home Access: Stairs to enter   Technical brewer of Steps: 1 Home Layout: Two level Home Equipment: None      Prior Function Level of Independence: Independent               Hand Dominance        Extremity/Trunk Assessment   Upper Extremity Assessment Upper Extremity Assessment: Overall WFL for tasks assessed    Lower Extremity Assessment Lower Extremity Assessment: LLE deficits/detail    Cervical / Trunk Assessment Cervical / Trunk Assessment: Normal  Communication   Communication: No difficulties  Cognition Arousal/Alertness: Awake/alert Behavior During Therapy: WFL for tasks assessed/performed Overall Cognitive Status: Within Functional Limits for tasks assessed                      General Comments      Exercises     Assessment/Plan    PT Assessment Patient needs continued PT services  PT Problem List Decreased activity tolerance;Decreased mobility;Decreased knowledge of use of DME;Decreased knowledge of precautions;Pain          PT Treatment Interventions DME instruction;Gait training;Stair training;Functional mobility training;Therapeutic activities;Therapeutic exercise;Patient/family education  PT Goals (Current goals can be found in the Care Plan section)  Acute Rehab PT Goals Patient Stated Goal: decreased pain PT Goal Formulation: With patient Time For Goal Achievement: 05/08/16 Potential to Achieve Goals: Good    Frequency Min 6X/week   Barriers to discharge        Co-evaluation               End of Session   Activity Tolerance:  Patient limited by pain Patient left: in chair;with call bell/phone within reach;with nursing/sitter in room Nurse Communication: Mobility status    Functional Assessment Tool Used: clinical judgement Functional Limitation: Mobility: Walking and moving around Mobility: Walking and Moving Around Current Status [id number]): At least 20 percent but less than 40 percent impaired, limited or restricted Mobility: Walking and Moving Around Goal Status [phone]): At least 1 percent but less than 20 percent impaired, limited or restricted    Time: [id number] PT Time Calculation (min) (ACUTE ONLY): 22 min   Charges:   PT Evaluation $PT Eval Low Complexity: 1 Procedure     PT G Codes:   PT G-Codes **NOT FOR INPATIENT CLASS** Functional Assessment Tool Used: clinical judgement Functional Limitation: Mobility: Walking and moving around Mobility: Walking and Moving Around Current Status [id number]): At least 20 percent but less than 40 percent impaired, limited or restricted Mobility: Walking and Moving Around Goal Status [phone]): At least 1 percent but less than 20 percent impaired, limited or restricted    [facility] 05/05/2016, 5:21 PM

## 2016-05-05 NOTE — Transfer of Care (Signed)
Immediate Anesthesia Transfer of Care Note  Patient: [name]  Procedure(s) Performed: Procedure(s): CENTRAL DECOMPRESSION L4-L5 AND FORAMINOTOMY FOR L4 ROOT AND L5 ROOT ON THE LEFT (Left)  Patient Location: PACU  Anesthesia Type:General  Level of Consciousness: sedated  Airway & Oxygen Therapy: Patient Spontanous Breathing and Patient connected to face mask oxygen  Post-op Assessment: Report given to RN and Post -op Vital signs reviewed and stable  Post vital signs: Reviewed and stable  Last Vitals:  Vitals:   05/05/16 0515  BP: (!) 155/81  Pulse: 100  Resp: 18  Temp: 36.7 C    Last Pain:  Vitals:   05/05/16 0553  TempSrc:   PainSc: 2       Patients Stated Pain Goal: 1 ([id number] [phone])  Complications: No apparent anesthesia complications

## 2016-05-05 NOTE — H&P (Signed)
[name] is an 35 y.o. male.   Chief Complaint: Pain in his left leg with weakness. HPI: Progressive pain in his Left lower extremityy and he has complained of weakness of his left foot.  Past Medical History:  Diagnosis Date  . Anemia   . Anxiety   . Depression   . Diabetes mellitus   . GERD (gastroesophageal reflux disease)   . Hypertension   . Lumbar disc herniation   . Neuromuscular disorder ([place])    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   . PTSD (post-traumatic stress disorder)   . Scoliosis     Past Surgical History:  Procedure Laterality Date  . NO PAST SURGERIES      History reviewed. No pertinent family history. Social History:  reports that he has quit smoking. His smoking use included Cigars. He quit smokeless tobacco use about 3 weeks ago. His smokeless tobacco use included Snuff. He reports that he drinks alcohol. He reports that he does not use drugs.  Allergies:  Allergies  Allergen Reactions  . Amoxicillin Anaphylaxis and Other (See Comments)    Has patient had a PCN reaction causing immediate rash, facial/tongue/throat swelling, SOB or lightheadedness with hypotension: yes Has patient had a PCN reaction causing severe rash involving mucus membranes or skin necrosis: no Has patient had a PCN reaction that required hospitalization yes Has patient had a PCN reaction occurring within the last 10 years: yes If all of the above answers are "NO", then may proceed with Cephalosporin use.  [name] Penicillins Anaphylaxis and Other (See Comments)    Patient was told to list this as an allergy because he is allergic to Amoxicillin Has patient had a PCN reaction causing immediate rash, facial/tongue/throat swelling, SOB or lightheadedness with hypotension: yes for Amoxicillin Has patient had a PCN reaction causing severe rash involving mucus membranes or skin necrosis: no Has patient had a PCN reaction that required hospitalization yes for Amoxicillin Has patient had a PCN  reaction occurring within the last 10 years: yes for Amoxicillin If all o  . Other Other (See Comments)    Powder in some gloves - localized itching but not allergic to latex, benadryl usually helps with this reaction    Medications Prior to Admission  Medication Sig Dispense Refill  . acetaminophen (TYLENOL) 500 MG tablet Take 1,000 mg by mouth every 6 (six) hours as needed for mild pain.    [name] aspirin EC 81 MG tablet Take 1 tablet (81 mg total) by mouth daily.    [name] buPROPion (WELLBUTRIN SR) 100 MG 12 hr tablet Take 1 tablet (100 mg total) by mouth 2 (two) times daily. 60 tablet 0  . chlorthalidone (HYGROTON) 25 MG tablet Take 1 tablet (25 mg total) by mouth daily. 90 tablet 3  . diphenhydrAMINE (BENADRYL) 25 mg capsule Take 25-50 mg by mouth daily as needed for allergies (DEPENDS ON HOW BAD ALLERGIES IF TAKES 25-50 MG).    . fenofibrate (TRICOR) 145 MG tablet Take 1 tablet (145 mg total) by mouth daily. 30 tablet 3  . fluconazole (DIFLUCAN) 150 MG tablet Take 150 mg by mouth every 14 (fourteen) days.    [name] gabapentin (NEURONTIN) 600 MG tablet Take 1 tablet (600 mg total) by mouth 3 (three) times daily. 90 tablet 3  . ibuprofen (ADVIL,MOTRIN) 200 MG tablet Take 800 mg by mouth every 6 (six) hours as needed for mild pain.    [name] insulin aspart (NOVOLOG) 100 UNIT/ML FlexPen Inject 10 Units into the skin 3 (  three) times daily with meals. 15 mL 5  . Insulin Glargine (LANTUS SOLOSTAR) 100 UNIT/ML Solostar Pen Inject 10 units twice a day (Patient taking differently: 70 Units 2 (two) times daily. Patient takes am and supper) 20 mL 2  . INVOKANA 300 MG TABS tablet TAKE 1 TABLET BY MOUTH EVERY DAY 30 tablet 0  . metFORMIN (GLUCOPHAGE-XR) 500 MG 24 hr tablet Take 4 tablets (2,000 mg total) by mouth daily with supper. 120 tablet 3  . ranitidine (ZANTAC) 150 MG tablet Take 1 tablet (150 mg total) by mouth 2 (two) times daily. 60 tablet 1  . B-D UF III MINI PEN NEEDLES 31G X 5 MM MISC USE AS DIRECTED EVERY DAY  100 each 0  . BAYER MICROLET LANCETS lancets Use as instructed to check blood sugar 4-5 times a week Dx code E11.42 100 each 2  . blood glucose meter kit and supplies KIT Dispense based on insurance preference. Use up to four times daily as directed. DX: E11.42 1 each 0  . Blood Glucose Monitoring Suppl (CONTOUR NEXT EZ MONITOR) w/Device KIT Use to check blood sugar 4-5 times a week Dx code E11.42 1 kit 0  . glucose blood (BAYER CONTOUR NEXT TEST) test strip Use as instructed to check blood sugar 4-5 times per week dx code E11.42 100 each 2  . pregabalin (LYRICA) 75 MG capsule Take 1 capsule (75 mg total) by mouth 3 (three) times daily as needed. 90 capsule 0  . traZODone (DESYREL) 150 MG tablet Take 1 tablet (150 mg total) by mouth at bedtime. 90 tablet 3    Results for orders placed or performed during the hospital encounter of 05/05/16 (from the past 48 hour(s))  Glucose, capillary     Status: Abnormal   Collection Time: 05/05/16  5:08 AM  Result Value Ref Range   Glucose-Capillary 227 (H) 65 - 99 mg/dL  Glucose, capillary     Status: Abnormal   Collection Time: 05/05/16  6:56 AM  Result Value Ref Range   Glucose-Capillary 181 (H) 65 - 99 mg/dL   No results found.  Review of Systems  All other systems reviewed and are negative.   Blood pressure (!) 155/81, pulse 100, temperature 98.1 F (36.7 C), temperature source Oral, resp. rate 18, height 6' 3"  (1.905 m), weight (!) 153.1 kg (337 lb 8 oz), SpO2 99 %. Physical Exam   Assessment/Plan Central Decompresive Lumbar Laminectomy and Microdiscectomy at L-4-L-5 on the Left.  Tobi Bastos, MD 05/05/2016, 7:27 AM

## 2016-05-05 NOTE — Brief Op Note (Signed)
05/05/2016  9:53 AM  PATIENT:  [name]  35 y.o. male  PRE-OPERATIVE DIAGNOSIS:  HNP and stenosis L4-L5 left and Morbid Obesity.Partial foot drop on the left.  POST-OPERATIVE DIAGNOSIS:  HNP and stenosis L4-L5 left, foraminal stenosis,involving the L-4 and L-5 nerve roots.Morbid Obesity This office visit was only partially documented in the computerized patient record as the provider is just learning to use this electronic medical record. Please refer to paper chart for additional information such as orders and charting. It may be necessary for the reader to review both the paper and the computerized patient record for this visit. Foot Drop on the left.  PROCEDURE:  Procedure(s): CENTRAL DECOMPRESSION L4-L5 AND FORAMINOTOMY FOR L4 ROOT AND L5 ROOT ON THE LEFT (Left),for severe Spinal and Foraminal stenosis  SURGEON:  Surgeon(s) and Role:    * Latanya Maudlin, MD - Primary  PHYSICIAN ASSISTANT: Ardeen Jourdain PA   ASSISTANTS: Ardeen Jourdain PA  ANESTHESIA:   general  EBL:  Total I/O In: 1000 [I.V.:1000] Out: 450 [Urine:350; Blood:100]  BLOOD ADMINISTERED:none  DRAINS: none   LOCAL MEDICATIONS USED:  Lidocaine 20cc with Epinephrine at the start of the case and 20cc of Exparel at the end of the case.    SPECIMEN:  No Specimen  DISPOSITION OF SPECIMEN:  N/A  COUNTS:  YES  TOURNIQUET:  * No tourniquets in log *  DICTATION: .Other Dictation: Dictation Number [id number]  PLAN OF CARE: Admit for overnight observation  PATIENT DISPOSITION:  PACU - hemodynamically stable.   Delay start of Pharmacological VTE agent (>24hrs) due to surgical blood loss or risk of bleeding: yes

## 2016-05-05 NOTE — Anesthesia Procedure Notes (Deleted)
Procedure Name: Intubation Date/Time: 05/05/2016 7:41 AM Performed by: Danley Danker L Patient Re-evaluated:Patient Re-evaluated prior to inductionOxygen Delivery Method: Circle system utilized Preoxygenation: Pre-oxygenation with 100% oxygen Intubation Type: IV induction Ventilation: Mask ventilation without difficulty and Oral airway inserted - appropriate to patient size Laryngoscope Size: Miller and 3 Grade View: Grade I Tube type: Oral Tube size: 8.0 mm Number of attempts: 1 Airway Equipment and Method: Stylet Placement Confirmation: ETT inserted through vocal cords under direct vision,  positive ETCO2 and breath sounds checked- equal and bilateral Secured at: 21 cm Tube secured with: Tape Dental Injury: Teeth and Oropharynx as per pre-operative assessment

## 2016-05-06 DIAGNOSIS — M48062 Spinal stenosis, lumbar region with neurogenic claudication: Secondary | ICD-10-CM | POA: Diagnosis not present

## 2016-05-06 LAB — GLUCOSE, CAPILLARY
GLUCOSE-CAPILLARY: 171 mg/dL — AB (ref 65–99)
GLUCOSE-CAPILLARY: 147 mg/dL — AB (ref 65–99)
Glucose-Capillary: 205 mg/dL — ABNORMAL HIGH (ref 65–99)
Glucose-Capillary: 210 mg/dL — ABNORMAL HIGH (ref 65–99)

## 2016-05-06 NOTE — Progress Notes (Signed)
CSW consulted for SNF placement. PN reviewed. Pt will dc home. RNCM will assist with d/c planning as needed.  Werner Lean LCSW [phone]

## 2016-05-06 NOTE — Progress Notes (Signed)
PT Cancellation Note  Patient Details Name: [name] MRN: [id number] DOB: 1981/02/20   Cancelled Treatment:     PT attempted x 3 but pain uncontrolled.  Will follow.   Rahi Chandonnet 05/06/2016, 1:35 PM

## 2016-05-06 NOTE — Op Note (Signed)
NAMEKINGSLY, SCURRY               ACCOUNT NO.:  [id number]  MEDICAL RECORD NO.:  [id number]  LOCATION:  [facility]                         FACILITY:  [facility]  PHYSICIAN:  Kipp Brood. Govani Radloff, M.D.DATE OF BIRTH:  09/10/1980  DATE OF PROCEDURE:  05/05/2016 DATE OF DISCHARGE:                              OPERATIVE REPORT   SURGEON:  Kipp Brood. Gladstone Lighter, M.D.  ASSISTANT:  Ardeen Jourdain, [place]  PREOPERATIVE DIAGNOSES: 1. Spinal stenosis at L4-5. 2. Foraminal stenosis for the L4 root on the left. 3. Foraminal stenosis for the L5 root on the left. 4. Partial footdrop on the left.  All symptoms were on the left.  POSTOPERATIVE DIAGNOSES: 1. Spinal stenosis at L4-5. 2. Foraminal stenosis for the L4 root on the left. 3. Foraminal stenosis for the L5 root on the left. 4. Partial footdrop on the left.  All symptoms were on the left.  OPERATION PERFORMED: 1. Central decompressive lumbar laminectomy at L4-5 for spinal     stenosis. 2. Foraminotomy of the L5 root on the left. 3. Foraminotomy of the L4 root on the left. 4. Exploration of the L4-5 disk.  DESCRIPTION OF PROCEDURE:  Under general anesthesia, routine orthopedic prep and draping of the lower back was carried out with the patient on the spinal frame.  He had 1 g of vancomycin.  The appropriate time-out was first carried out, also marked the appropriate left side of the back, even though we went central in the preop area.  At this time, after this was all completed, 2 needles were placed in the back for localization purposes.  X-ray was taken.  We then made an incision over the L4-5 space, extended it proximally and distally.  Note, this patient was morbidly obese.  He weighed approximately 330 pounds, so we took a great deal of time going down to the space.  At this time, I then separated the muscle from the lamina and spinous process bilaterally.  A Kocher clamp was placed on the spinous process to further identify the L4-5 space.  Once  this was done, the self-retaining McCulloch retractors were inserted.  We had good hemostasis.  At this particular point, we then started our central decompression by removing the spinous process of L4 and we did a laminectomy at the L4-5 space until we came down to the ligamentum flavum.  We then gently opened up the ligamentum flavum. Instrument was in place for marking purposes.  Another x-ray was taken. We later took the last x-ray with the needle near the space.  Once we identified the L4-5 space, we went out laterally and decompressed the lateral recess, we cauterized lateral recess veins.  Note, he had a very peculiar appearance of the root, above the root was definitely decompressed when we did our foraminotomy as well as the root below.  We decompressed both L4 and the L5 root for foraminotomy but inferior to that root was a small twig that came off the dura.  We did not violate that with any cautery, etc.  We had a tiny little pinhole like size hole at small twig of nerve root and we had a small amount of fluid leakage. We put a  little dura patch on that.  Note, the main roots were not violated.  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  Note, we probed the disk space.  The disk was extremely calcified and hard.  He had a slight pseudospondylolisthesis as well.  Once we were done, we were able to easily move the dura over without any resistance.  The roots were now free.  We were able to easily pass hockeysticks out both roots on the left side.  We thoroughly decompressed the area, irrigated out, and then closed the wound layers in usual fashion.  We left a small deep distal and proximal part of the wound open for drainage purposes.  Subcu was closed with #1 Vicryl and followed by 2-0 Vicryl and staples.  At the beginning of procedure, I injected 20 mL of lidocaine with epinephrine to control the bleeding that worked  out nicely.  At the end of the procedure, we injected 20 mL of Exparel into the muscle and soft tissue. Sterile dressings were applied.  A Foley catheter was inserted preoperatively was removed in the operating room after surgery.  Note, the patient is diabetic and we will continue to monitor his diabetes.          ______________________________ Kipp Brood Gladstone Lighter, M.D.     RAG/MEDQ  D:  05/05/2016  T:  05/06/2016  Job:  [id number]

## 2016-05-06 NOTE — Evaluation (Signed)
Occupational Therapy Evaluation Patient Details Name: [name] MRN: [id number] DOB: 04/05/1981 Today's Date: 05/06/2016    History of Present Illness Pt s/p L4-5 decompression with hx of DM, PTSD and L partial drop foot   Clinical Impression   This 35 year old man was admited for the above sx. All education was completed.  No further OT is needed.     Follow Up Recommendations  No OT follow up    Equipment Recommendations  3 in 1 bedside commode (wide)    Recommendations for Other Services       Precautions / Restrictions Precautions Precautions: Back Restrictions Weight Bearing Restrictions: No      Mobility Bed Mobility         Supine to sit: Min guard     General bed mobility comments: extra time, used bedrail  Transfers   Equipment used: Rolling walker (2 wheeled)   Sit to Stand: Min assist         General transfer comment: steadying assistance during transition.  Pt pushed up from bedrail    Balance                                            ADL Overall ADL's : Needs assistance/impaired     Grooming: Set up;Sitting   Upper Body Bathing: Set up;Sitting   Lower Body Bathing: Moderate assistance;Sit to/from stand   Upper Body Dressing : Set up;Sitting   Lower Body Dressing: Maximal assistance;Sit to/from stand   Toilet Transfer: Minimal assistance;Ambulation;RW (to recliner)   Toileting- Clothing Manipulation and Hygiene: Minimal assistance;Sit to/from stand         General ADL Comments: educated on tub bench, side stepping over tub when ready and toilet aide.  Pt will have wife assist as needed. She works the 2nd shift.  Mother will care for the baby (she runs a day care).  Pt will always have 33 year old with him.  Pt will have 24/7 from wife until Tuesday     Vision     Perception     Praxis      Pertinent Vitals/Pain Pain Assessment: 0-10 Pain Score: 6  Pain Location: back Pain Descriptors /  Indicators: Aching Pain Intervention(s): Limited activity within patient's tolerance;Monitored during session;Premedicated before session;Repositioned     Hand Dominance     Extremity/Trunk Assessment Upper Extremity Assessment Upper Extremity Assessment: Overall WFL for tasks assessed           Communication Communication Communication: No difficulties   Cognition Arousal/Alertness: Awake/alert Behavior During Therapy: WFL for tasks assessed/performed Overall Cognitive Status: Within Functional Limits for tasks assessed                     General Comments       Exercises       Shoulder Instructions      Home Living Family/patient expects to be discharged to:: Private residence Living Arrangements: Spouse/significant other;Children Available Help at Discharge: Family Type of Home: House             Bathroom Shower/Tub: Tub/shower unit Shower/tub characteristics: Architectural technologist: Standard     Home Equipment: None   Additional Comments: has 5 kids at home, ages 2 months to 11 years      Prior Functioning/Environment Level of Independence: Independent  OT Problem List:     OT Treatment/Interventions:      OT Goals(Current goals can be found in the care plan section) Acute Rehab OT Goals Patient Stated Goal: decreased pain OT Goal Formulation: All assessment and education complete, DC therapy  OT Frequency:     Barriers to D/C:            Co-evaluation              End of Session    Activity Tolerance: Patient limited by pain Patient left: in bed;with call bell/phone within reach   Time: 0813-0837 OT Time Calculation (min): 24 min Charges:  OT General Charges $OT Visit: 1 Procedure OT Evaluation $OT Eval Low Complexity: 1 Procedure G-Codes: OT G-codes **NOT FOR INPATIENT CLASS** Functional Assessment Tool Used: clinical observation and judgment Functional Limitation: Self care Self Care  Current Status [id number]): At least 60 percent but less than 80 percent impaired, limited or restricted Self Care Goal Status [id number]): At least 60 percent but less than 80 percent impaired, limited or restricted Self Care Discharge Status [phone]): At least 60 percent but less than 80 percent impaired, limited or restricted  [facility] 05/06/2016, 8:43 AM Lesle Chris, OTR/L [phone] 05/06/2016

## 2016-05-06 NOTE — Progress Notes (Signed)
Subjective: 1 Day Post-Op Procedure(s) (LRB): CENTRAL DECOMPRESSION L4-L5 AND FORAMINOTOMY FOR L4 ROOT AND L5 ROOT ON THE LEFT (Left) Patient reports pain as 2 on 0-10 scale.Doing well. Minimal blood on dressing. His foot drop function that he had Pre-Op is now absent. His function is now normal.Blood Sugar is still elevated.Ihad a long talk with him about his diet.    Objective: Vital signs in last 24 hours: Temp:  [97.6 F (36.4 C)-98.7 F (37.1 C)] 98.7 F (37.1 C) (12/28 0624) Pulse Rate:  [80-112] 103 (12/28 0624) Resp:  [9-30] 18 (12/28 0624) BP: (99-148)/(48-83) 126/76 (12/28 0624) SpO2:  [97 %-100 %] 98 % (12/28 0624)  Intake/Output from previous day: 12/27 0701 - 12/28 0700 In: 4040 [P.O.:1440; I.V.:2400; IV Piggyback:200] Out: 5400 [Urine:5250; Blood:150] Intake/Output this shift: No intake/output data recorded.  No results for input(s): HGB in the last 72 hours. No results for input(s): WBC, RBC, HCT, PLT in the last 72 hours. No results for input(s): NA, K, CL, CO2, BUN, CREATININE, GLUCOSE, CALCIUM in the last 72 hours. No results for input(s): LABPT, INR in the last 72 hours.  Dorsiflexion/Plantar flexion intact  Assessment/Plan: 1 Day Post-Op Procedure(s) (LRB): CENTRAL DECOMPRESSION L4-L5 AND FORAMINOTOMY FOR L4 ROOT AND L5 ROOT ON THE LEFT (Left) Up with therapy Discharge home with home health  Jaeden Messer A 05/06/2016, 7:29 AM

## 2016-05-06 NOTE — Care Management Note (Signed)
Case Management Note  Patient Details  Name: [name] MRN: [id number] Date of Birth: [birth year]  Subjective/Objective:                  CENTRAL DECOMPRESSION L4-L5 AND FORAMINOTOMY FOR L4 ROOT AND L5 ROOT ON THE LEFT (Left) Action/Plan: Discharge planning Expected Discharge Date:  05/06/16               Expected Discharge Plan:  Home/Self Care  In-House Referral:     Discharge planning Services  CM Consult  Post Acute Care Choice:  Durable Medical Equipment Choice offered to:  Patient  DME Arranged:  3-N-1[name] wide DME Agency:  [place]:  NA [place] Agency:  NA  Status of Service:  Completed, signed off  If discussed at [place] of Stay Meetings, dates discussed:    Additional Comments: CM met with pt in room to discuss DMe needs.  CM has notified [place] DME rep, Larene Beach to please deliver the wide rolling walker and bari commode.  No other CM needs were communicated. Dellie Catholic, RN 05/06/2016, 11:31 AM

## 2016-05-07 DIAGNOSIS — M48062 Spinal stenosis, lumbar region with neurogenic claudication: Secondary | ICD-10-CM | POA: Diagnosis not present

## 2016-05-07 LAB — GLUCOSE, CAPILLARY: GLUCOSE-CAPILLARY: 219 mg/dL — AB (ref 65–99)

## 2016-05-07 NOTE — Progress Notes (Signed)
Pt was discharged home today. Instructions and prescriptions were given with patient, and questions were answered. Pt was taken to main entrance via wheelchair by NT.

## 2016-05-07 NOTE — Progress Notes (Signed)
Physical Therapy Treatment Patient Details Name: [name] MRN: [id number] DOB: June 21, 1980 Today's Date: 05/07/2016    History of Present Illness Pt s/p L4-5 decompression with hx of DM, PTSD and L partial drop foot    PT Comments    Pt continues pain limited but is progressing slowly with mobility including negotiating stairs.  Follow Up Recommendations  No PT follow up     Equipment Recommendations  Rolling walker with 5" wheels    Recommendations for Other Services OT consult     Precautions / Restrictions Precautions Precautions: Back Precaution Comments: Pt recalls all without cues Restrictions Weight Bearing Restrictions: No    Mobility  Bed Mobility               General bed mobility comments: Pt OOB in chair and declines to attempt  Transfers Overall transfer level: Needs assistance Equipment used: Rolling walker (2 wheeled) Transfers: Sit to/from Stand Sit to Stand: Min guard;Supervision         General transfer comment: steadying assistance during transition.   Ambulation/Gait Ambulation/Gait assistance: Min guard;Supervision Ambulation Distance (Feet): 123 Feet Assistive device: Rolling walker (2 wheeled) Gait Pattern/deviations: Step-through pattern;Decreased step length - right;Decreased step length - left;Shuffle;Trunk flexed Gait velocity: decr Gait velocity interpretation: Below normal speed for age/gender General Gait Details: Increased time and cues for posture and position from RW   Stairs Stairs: Yes   Stair Management: No rails;One rail Right;Step to pattern;Forwards;With crutches;With walker Number of Stairs: 3 General stair comments: Single step with RW and 2 steps with crutch and rail.  Cues for sequence and foot/crutch/RW  placement  Wheelchair Mobility    Modified Rankin (Stroke Patients Only)       Balance                                    Cognition Arousal/Alertness: Awake/alert Behavior  During Therapy: WFL for tasks assessed/performed Overall Cognitive Status: Within Functional Limits for tasks assessed                      Exercises      General Comments        Pertinent Vitals/Pain Pain Assessment: 0-10 Pain Score: 6  Pain Location: back Pain Descriptors / Indicators: Aching Pain Intervention(s): Limited activity within patient's tolerance;Monitored during session;Premedicated before session    Home Living                      Prior Function            PT Goals (current goals can now be found in the care plan section) Acute Rehab PT Goals Patient Stated Goal: decreased pain PT Goal Formulation: With patient Time For Goal Achievement: 05/08/16 Potential to Achieve Goals: Good Progress towards PT goals: Progressing toward goals    Frequency    Min 6X/week      PT Plan Current plan remains appropriate    Co-evaluation             End of Session   Activity Tolerance: Patient limited by pain Patient left: in chair;with call bell/phone within reach     Time: 0851-0920 PT Time Calculation (min) (ACUTE ONLY): 29 min  Charges:  $Gait Training: 23-37 mins                    G Codes:  Layden Caterino 05/07/2016, 12:48 PM

## 2016-05-07 NOTE — Progress Notes (Signed)
Subjective: 2 Days Post-Op Procedure(s) (LRB): CENTRAL DECOMPRESSION L4-L5 AND FORAMINOTOMY FOR L4 ROOT AND L5 ROOT ON THE LEFT (Left) Patient reports pain as 3 on 0-10 scale. Doing much better this morning. Will DC   Objective: Vital signs in last 24 hours: Temp:  [97.8 F (36.6 C)-98.7 F (37.1 C)] 97.8 F (36.6 C) (12/29 0542) Pulse Rate:  [101-119] 111 (12/29 0542) Resp:  [18-19] 19 (12/29 0542) BP: (113-149)/(60-91) 116/72 (12/29 0542) SpO2:  [96 %-100 %] 96 % (12/29 0542)  Intake/Output from previous day: 12/28 0701 - 12/29 0700 In: 990 [P.O.:880; I.V.:110] Out: 1250 [Urine:1250] Intake/Output this shift: No intake/output data recorded.  No results for input(s): HGB in the last 72 hours. No results for input(s): WBC, RBC, HCT, PLT in the last 72 hours. No results for input(s): NA, K, CL, CO2, BUN, CREATININE, GLUCOSE, CALCIUM in the last 72 hours. No results for input(s): LABPT, INR in the last 72 hours.  Dorsiflexion/Plantar flexion intact No cellulitis present  Assessment/Plan: 2 Days Post-Op Procedure(s) (LRB): CENTRAL DECOMPRESSION L4-L5 AND FORAMINOTOMY FOR L4 ROOT AND L5 ROOT ON THE LEFT (Left) Up with therapy Discharge home with home health  Yisroel Mullendore A 05/07/2016, 7:20 AM

## 2016-05-11 ENCOUNTER — Encounter (HOSPITAL_COMMUNITY): Payer: Self-pay | Admitting: Orthopedic Surgery

## 2016-05-11 NOTE — Discharge Summary (Signed)
Physician Discharge Summary   Patient ID: [name] MRN: [id number] DOB/AGE: 09-19-1980 36 y.o.  Admit date: 05/05/2016 Discharge date: 05/07/2016  Primary Diagnosis: Lumbar spinal stenosis L4-L5   Admission Diagnoses:  Past Medical History:  Diagnosis Date  . Anemia   . Anxiety   . Depression   . Diabetes mellitus   . GERD (gastroesophageal reflux disease)   . Hypertension   . Lumbar disc herniation   . 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 PTSD (post-traumatic stress disorder)   . Scoliosis    Discharge Diagnoses:   Active Problems:   Spinal stenosis, lumbar region with neurogenic claudication  Estimated body mass index is 42.18 kg/m as calculated from the following:   Height as of this encounter: 6' 3"  (1.905 m).   Weight as of this encounter: 153.1 kg (337 lb 8 oz).  Procedure:  Procedure(s) (LRB): CENTRAL DECOMPRESSION L4-L5 AND FORAMINOTOMY FOR L4 ROOT AND L5 ROOT ON THE LEFT (Left)   Consults: None  HPI: The patient is a 36 year old male who presented with progressive pain in his left lower extremity as well as weakness of his left foot. He has not improved with conservative treatments including corticosteroids and activity modification. He has been dealing with this for several months but has been trying to get his Hgb A1c in a safer range.  Laboratory Data: Admission on 05/05/2016, Discharged on 05/07/2016  Component Date Value Ref Range Status  . Glucose-Capillary 05/05/2016 227* 65 - 99 mg/dL Final  . Glucose-Capillary 05/05/2016 181* 65 - 99 mg/dL Final  . Glucose-Capillary 05/05/2016 245* 65 - 99 mg/dL Final  . Comment 1 05/05/2016 Notify RN   Final  . Glucose-Capillary 05/05/2016 202* 65 - 99 mg/dL Final  . Glucose-Capillary 05/05/2016 237* 65 - 99 mg/dL Final  . Glucose-Capillary 05/06/2016 205* 65 - 99 mg/dL Final  . Glucose-Capillary 05/06/2016 210* 65 - 99 mg/dL Final  . Glucose-Capillary 05/06/2016 171* 65 - 99  mg/dL Final  . Glucose-Capillary 05/06/2016 147* 65 - 99 mg/dL Final  . Glucose-Capillary 05/07/2016 219* 65 - 99 mg/dL Final  Hospital Outpatient Visit on 04/28/2016  Component Date Value Ref Range Status  . WBC 04/28/2016 12.3* 4.0 - 10.5 K/uL Final  . RBC 04/28/2016 5.27  4.22 - 5.81 MIL/uL Final  . Hemoglobin 04/28/2016 13.9  13.0 - 17.0 g/dL Final  . HCT 04/28/2016 40.6  39.0 - 52.0 % Final  . MCV 04/28/2016 77.0* 78.0 - 100.0 fL Final  . MCH 04/28/2016 26.4  26.0 - 34.0 pg Final  . MCHC 04/28/2016 34.2  30.0 - 36.0 g/dL Final  . RDW 04/28/2016 13.9  11.5 - 15.5 % Final  . Platelets 04/28/2016 323  150 - 400 K/uL Final  . Neutrophils Relative % 04/28/2016 66  % Final  . Neutro Abs 04/28/2016 8.1* 1.7 - 7.7 K/uL Final  . Lymphocytes Relative 04/28/2016 26  % Final  . Lymphs Abs 04/28/2016 3.2  0.7 - 4.0 K/uL Final  . Monocytes Relative 04/28/2016 6  % Final  . Monocytes Absolute 04/28/2016 0.7  0.1 - 1.0 K/uL Final  . Eosinophils Relative 04/28/2016 2  % Final  . Eosinophils Absolute 04/28/2016 0.3  0.0 - 0.7 K/uL Final  . Basophils Relative 04/28/2016 0  % Final  . Basophils Absolute 04/28/2016 0.0  0.0 - 0.1 K/uL Final  . Sodium 04/28/2016 136  135 - 145 mmol/L Final  . Potassium 04/28/2016 3.8  3.5 -  5.1 mmol/L Final  . Chloride 04/28/2016 98* 101 - 111 mmol/L Final  . CO2 04/28/2016 29  22 - 32 mmol/L Final  . Glucose, Bld 04/28/2016 320* 65 - 99 mg/dL Final  . BUN 04/28/2016 13  6 - 20 mg/dL Final  . Creatinine, Ser 04/28/2016 0.90  0.61 - 1.24 mg/dL Final  . Calcium 04/28/2016 9.6  8.9 - 10.3 mg/dL Final  . Total Protein 04/28/2016 7.8  6.5 - 8.1 g/dL Final  . Albumin 04/28/2016 4.2  3.5 - 5.0 g/dL Final  . AST 04/28/2016 24  15 - 41 U/L Final  . ALT 04/28/2016 22  17 - 63 U/L Final  . Alkaline Phosphatase 04/28/2016 51  38 - 126 U/L Final  . Total Bilirubin 04/28/2016 0.9  0.3 - 1.2 mg/dL Final  . GFR calc non Af Amer 04/28/2016 >60  >60 mL/min Final  . GFR calc Af  Amer 04/28/2016 >60  >60 mL/min Final   Comment: (NOTE) The eGFR has been calculated using the CKD EPI equation. This calculation has not been validated in all clinical situations. eGFR's persistently <60 mL/min signify possible Chronic Kidney Disease.   . Anion gap 04/28/2016 9  5 - 15 Final  . Prothrombin Time 04/28/2016 11.9  11.4 - 15.2 seconds Final  . INR 04/28/2016 0.88   Final  . ABO/RH(D) 05/05/2016 O POS   Final  . Antibody Screen 05/05/2016 NEG   Final  . Sample Expiration 05/05/2016 05/08/2016   Final  . Extend sample reason 05/05/2016 NO TRANSFUSIONS OR PREGNANCY IN THE PAST 3 MONTHS   Final  . Color, Urine 04/28/2016 STRAW* YELLOW Final  . APPearance 04/28/2016 CLEAR  CLEAR Final  . Specific Gravity, Urine 04/28/2016 1.024  1.005 - 1.030 Final  . pH 04/28/2016 6.0  5.0 - 8.0 Final  . Glucose, UA 04/28/2016 >=500* NEGATIVE mg/dL Final  . Hgb urine dipstick 04/28/2016 NEGATIVE  NEGATIVE Final  . Bilirubin Urine 04/28/2016 NEGATIVE  NEGATIVE Final  . Ketones, ur 04/28/2016 NEGATIVE  NEGATIVE mg/dL Final  . Protein, ur 04/28/2016 NEGATIVE  NEGATIVE mg/dL Final  . Nitrite 04/28/2016 NEGATIVE  NEGATIVE Final  . Leukocytes, UA 04/28/2016 NEGATIVE  NEGATIVE Final  . RBC / HPF 04/28/2016 0-5  0 - 5 RBC/hpf Final  . WBC, UA 04/28/2016 0-5  0 - 5 WBC/hpf Final  . Bacteria, UA 04/28/2016 NONE SEEN  NONE SEEN Final  . Squamous Epithelial / LPF 04/28/2016 NONE SEEN  NONE SEEN Final  . Hgb A1c MFr Bld 04/29/2016 10.0* 4.8 - 5.6 % Final   Comment: (NOTE)         Pre-diabetes: 5.7 - 6.4         Diabetes: >6.4         Glycemic control for adults with diabetes: <7.0   . Mean Plasma Glucose 04/29/2016 240  mg/dL Final   Comment: (NOTE) Performed At: [facility] [place], [place] [id number] Lindon Romp MD [id number]   . MRSA, PCR 04/28/2016 NEGATIVE  NEGATIVE Final  . Staphylococcus aureus 04/28/2016 NEGATIVE  NEGATIVE Final   Comment:        The  Xpert SA Assay (FDA approved for NASAL specimens in patients over 74 years of age), is one component of a comprehensive surveillance program.  Test performance has been validated by [facility] for patients greater than or equal to 81 year old. It is not intended to diagnose infection nor to guide or monitor treatment.   [name]  Glucose-Capillary 04/28/2016 337* 65 - 99 mg/dL Final     X-Rays:Dg Lumbar Spine 2-3 Views  Addendum Date: 05/04/2016   ADDENDUM REPORT: 05/04/2016 10:27 ADDENDUM: Frontal view shows mild dextroscoliosis. The vertebral bodies were labeled on the frontal view also. Electronically Signed   By: Lahoma Crocker M.D.   On: 05/04/2016 10:27   Result Date: 05/04/2016 CLINICAL DATA:  Preop for L4-L5 decompression EXAM: LUMBAR SPINE - 2-3 VIEW COMPARISON:  03/11/2016 FINDINGS: Two views of the lumbar spine submitted. Mild dextroscoliosis. Again noted disc space flattening at L4-L5 and L5-S1 level. No acute fracture or subluxation. IMPRESSION: No acute fracture or subluxation. Mild dextroscoliosis. Again noted disc space flattening at L4-L5 and L5-S1 level. Electronically Signed: By: Lahoma Crocker M.D. On: 04/28/2016 16:20   Dg Spine Portable 1 View  Result Date: 05/05/2016 CLINICAL DATA:  Lumbar laminectomy EXAM: PORTABLE SPINE - 1 VIEW COMPARISON:  Study obtained earlier in the day FINDINGS: Cross-table lateral lumbar image labeled #4 submitted. Metallic probe tip is posterior to the superior most aspect of the L5 vertebral body. Cutting tool overlies the L5 spinous process. There is moderate disc space narrowing at L4-5 and L5-S1. No fracture or spondylolisthesis. IMPRESSION: Metallic probe tip posterior to the superior most aspect of the L5 vertebral body. Moderate disc space narrowing at L4-5 and L5-S1. Electronically Signed   By: Lowella Grip III M.D.   On: 05/05/2016 09:31   Dg Spine Portable 1 View  Result Date: 05/05/2016 CLINICAL DATA:  Surgical level. EXAM: PORTABLE  SPINE - 1 VIEW COMPARISON:  Preoperative lumbar myelogram 03/11/2016 FINDINGS: Transitional anatomy described on 03/11/2016 myelogram. Same numbering scheme on labeled images used on this exam. Retractor has been placed overlapping the L4 and L5 spinous processes. A probe projects posteriorly at the level of the L4-5 disc space. IMPRESSION: Surgical probe projects dorsal to the L4-5 disc. Electronically Signed   By: Monte Fantasia M.D.   On: 05/05/2016 09:01   Dg Spine Portable 1 View  Result Date: 05/05/2016 CLINICAL DATA:  Lumbar spine localization EXAM: PORTABLE SPINE - 1 VIEW COMPARISON:  Prior film same day FINDINGS: Single lateral view of the lumbar spine submitted. There is a posterior metallic localization instrument overlying the spinous process of L4 about L4-L5 disc level. IMPRESSION: Posterior localization instrument at L4-L5 disc level. Electronically Signed   By: Lahoma Crocker M.D.   On: 05/05/2016 08:38   Dg Spine Portable 1 View  Result Date: 05/05/2016 CLINICAL DATA:  Lumbar spine surgery EXAM: PORTABLE SPINE - 1 VIEW COMPARISON:  04/28/2016 . FINDINGS: Lumbar vertebra numbered as per prior exam. Metallic markers noted posteriorly at the L4 and L5 levels. IMPRESSION: Metallic markers noted posteriorly at the L4 and L5 levels. Electronically Signed   By: Marcello Moores  Register   On: 05/05/2016 08:13    EKG: Orders placed or performed during the hospital encounter of 10/23/15  . EKG  . EKG     Hospital Course: GAINES CARTMELL is a 36 y.o. who was admitted to [facility]. They were brought to the operating room on 05/05/2016 and underwent Procedure(s): CENTRAL DECOMPRESSION L4-L5 AND FORAMINOTOMY FOR L4 ROOT AND L5 ROOT ON THE LEFT.  Patient tolerated the procedure well and was later transferred to the recovery room and then to the orthopaedic floor for postoperative care.  They were given PO and IV analgesics for pain control following their surgery.  They were given 24 hours of  postoperative antibiotics of  Anti-infectives  Start     Dose/Rate Route Frequency Ordered Stop   05/05/16 1800  vancomycin (VANCOCIN) IVPB 1000 mg/200 mL premix     1,000 mg 200 mL/hr over 60 Minutes Intravenous  Once 05/05/16 1059 05/05/16 1902   05/05/16 1400  fluconazole (DIFLUCAN) tablet 150 mg  Status:  Discontinued     150 mg Oral Every 14 days 05/05/16 1350 05/05/16 1357   05/05/16 0854  polymyxin B 500,000 Units, bacitracin 50,000 Units in sodium chloride irrigation 0.9 % 500 mL irrigation  Status:  Discontinued       As needed 05/05/16 0854 05/05/16 1012   05/05/16 0600  vancomycin (VANCOCIN) 1,500 mg in sodium chloride 0.9 % 500 mL IVPB     1,500 mg 250 mL/hr over 120 Minutes Intravenous On call to O.R. 05/04/16 1216 05/05/16 0840     and started on DVT prophylaxis in the form of Aspirin.   PTwas ordered to ambulate the patient. Discharge planning consulted to help with postop disposition and equipment needs.  Patient had a fair night on the evening of surgery.  They started to get up OOB with therapy on day one. Patient was unable to go home post op day one due to poorly controlled pain. Continued to work with therapy into day two.  Dressing was changed on day two and the incision was clean and dry. Incision was healing well.  Patient was seen in rounds and was ready to go home.   Diet: Cardiac diet and Diabetic diet Activity:WBAT Follow-up:in 2 weeks Disposition - Home Discharged Condition: stable   Discharge Instructions    Call MD / Call 911    Complete by:  As directed    If you experience chest pain or shortness of breath, CALL 911 and be transported to the hospital emergency room.  If you develope a fever above 101 F, pus (white drainage) or increased drainage or redness at the wound, or calf pain, call your surgeon's office.   Constipation Prevention    Complete by:  As directed    Drink plenty of fluids.  Prune juice may be helpful.  You may use a stool softener,  such as Colace (over the counter) 100 mg twice a day.  Use MiraLax (over the counter) for constipation as needed.   Diet - low sodium heart healthy    Complete by:  As directed    Diet Carb Modified    Complete by:  As directed    Discharge instructions    Complete by:  As directed    For the first three days, remove your dressing, tape a piece of saran wrap over your incision Take your shower, then remove the saran wrap and put a clean dressing on. After three days you can shower without the saran wrap.  No driving while taking pain medication. No lifting or bending. Call Dr. Gladstone Lighter if any wound complications or temperature of 101 degrees F or over.  Call the office for an appointment to see Dr. Gladstone Lighter in two weeks: [phone] and ask for Dr. Charlestine Night nurse, Brunilda Payor.   Increase activity slowly as tolerated    Complete by:  As directed      Allergies as of 05/07/2016      Reactions   Amoxicillin Anaphylaxis, Other (See Comments)   Has patient had a PCN reaction causing immediate rash, facial/tongue/throat swelling, SOB or lightheadedness with hypotension: yes Has patient had a PCN reaction causing severe rash involving mucus membranes or skin necrosis: no  Has patient had a PCN reaction that required hospitalization yes Has patient had a PCN reaction occurring within the last 10 years: yes If all of the above answers are "NO", then may proceed with Cephalosporin use.   Penicillins Anaphylaxis, Other (See Comments)   Patient was told to list this as an allergy because he is allergic to Amoxicillin Has patient had a PCN reaction causing immediate rash, facial/tongue/throat swelling, SOB or lightheadedness with hypotension: yes for Amoxicillin Has patient had a PCN reaction causing severe rash involving mucus membranes or skin necrosis: no Has patient had a PCN reaction that required hospitalization yes for Amoxicillin Has patient had a PCN reaction occurring within the last 10  years: yes for Amoxicillin If all o   Other Other (See Comments)   Powder in some gloves - localized itching but not allergic to latex, benadryl usually helps with this reaction      Medication List    STOP taking these medications   acetaminophen 500 MG tablet Commonly known as:  TYLENOL     TAKE these medications   aspirin EC 81 MG tablet Take 1 tablet (81 mg total) by mouth daily.   B-D UF III MINI PEN NEEDLES 31G X 5 MM Misc Generic drug:  Insulin Pen Needle USE AS DIRECTED EVERY DAY   BAYER MICROLET LANCETS lancets Use as instructed to check blood sugar 4-5 times a week Dx code E11.42   blood glucose meter kit and supplies Kit Dispense based on insurance preference. Use up to four times daily as directed. DX: E11.42   buPROPion 100 MG 12 hr tablet Commonly known as:  WELLBUTRIN SR Take 1 tablet (100 mg total) by mouth 2 (two) times daily.   chlorthalidone 25 MG tablet Commonly known as:  HYGROTON Take 1 tablet (25 mg total) by mouth daily.   CONTOUR NEXT EZ MONITOR w/Device Kit Use to check blood sugar 4-5 times a week Dx code E11.42   diphenhydrAMINE 25 mg capsule Commonly known as:  BENADRYL Take 25-50 mg by mouth daily as needed for allergies (DEPENDS ON HOW BAD ALLERGIES IF TAKES 25-50 MG).   fenofibrate 145 MG tablet Commonly known as:  TRICOR Take 1 tablet (145 mg total) by mouth daily.   fluconazole 150 MG tablet Commonly known as:  DIFLUCAN Take 150 mg by mouth every 14 (fourteen) days.   glucose blood test strip Commonly known as:  BAYER CONTOUR NEXT TEST Use as instructed to check blood sugar 4-5 times per week dx code E11.42   ibuprofen 200 MG tablet Commonly known as:  ADVIL,MOTRIN Take 800 mg by mouth every 6 (six) hours as needed for mild pain.   insulin aspart 100 UNIT/ML FlexPen Commonly known as:  NOVOLOG Inject 10 Units into the skin 3 (three) times daily with meals.   Insulin Glargine 100 UNIT/ML Solostar Pen Commonly known as:   LANTUS SOLOSTAR Inject 10 units twice a day What changed:  how much to take  when to take this  additional instructions   INVOKANA 300 MG Tabs tablet Generic drug:  canagliflozin TAKE 1 TABLET BY MOUTH EVERY DAY   metFORMIN 500 MG 24 hr tablet Commonly known as:  GLUCOPHAGE-XR Take 4 tablets (2,000 mg total) by mouth daily with supper.   methocarbamol 500 MG tablet Commonly known as:  ROBAXIN Take 1 tablet (500 mg total) by mouth every 6 (six) hours as needed for muscle spasms.   oxyCODONE-acetaminophen 5-325 MG tablet Commonly known as:  PERCOCET/ROXICET Take  1-2 tablets by mouth every 4 (four) hours as needed for moderate pain.   pregabalin 75 MG capsule Commonly known as:  LYRICA Take 1 capsule (75 mg total) by mouth 3 (three) times daily as needed.   ranitidine 150 MG tablet Commonly known as:  ZANTAC Take 1 tablet (150 mg total) by mouth 2 (two) times daily.   traZODone 150 MG tablet Commonly known as:  DESYREL Take 1 tablet (150 mg total) by mouth at bedtime.      Follow-up Information    GIOFFRE,RONALD A, MD. Schedule an appointment as soon as possible for a visit in 2 week(s).   Specialty:  Orthopedic Surgery Contact information: [address] [place] [address] [phone]           Signed: Ardeen Jourdain, PA-C Orthopaedic Surgery 05/11/2016, 1:46 PM

## 2016-05-30 ENCOUNTER — Other Ambulatory Visit: Payer: Self-pay | Admitting: Family Medicine

## 2016-05-31 ENCOUNTER — Other Ambulatory Visit: Payer: Self-pay | Admitting: Family Medicine

## 2016-06-10 ENCOUNTER — Ambulatory Visit (INDEPENDENT_AMBULATORY_CARE_PROVIDER_SITE_OTHER): Payer: BLUE CROSS/BLUE SHIELD | Admitting: Family Medicine

## 2016-06-10 DIAGNOSIS — G629 Polyneuropathy, unspecified: Secondary | ICD-10-CM

## 2016-06-10 DIAGNOSIS — F4310 Post-traumatic stress disorder, unspecified: Secondary | ICD-10-CM

## 2016-06-10 DIAGNOSIS — F331 Major depressive disorder, recurrent, moderate: Secondary | ICD-10-CM

## 2016-06-10 MED ORDER — GLUCOSE BLOOD VI STRP: ORAL_STRIP | 2 refills | Status: DC

## 2016-06-10 MED ORDER — PREGABALIN 200 MG PO CAPS: 200.0000 mg | ORAL_CAPSULE | Freq: Two times a day (BID) | ORAL | 0 refills | Status: DC

## 2016-06-10 MED ORDER — BAYER MICROLET LANCETS MISC: 3 refills | Status: DC

## 2016-06-10 MED ORDER — BUPROPION HCL ER (SR) 200 MG PO TB12: 200.0000 mg | ORAL_TABLET | Freq: Two times a day (BID) | ORAL | 2 refills | Status: DC

## 2016-06-10 NOTE — Patient Instructions (Addendum)
Diabetes-Check blood sugar at minimally daily, preferred 3 times daily.  I referred you to psychiatry and increased your Wellbutrin 200 mg twice daily.  I have refilled your Lyrica to 200 mg twice daily as needed for pain.     IF you received an x-ray today, you will receive an invoice from [facility]. Please contact [facility] at [phone] with questions or concerns regarding your invoice.   IF you received labwork today, you will receive an invoice from [facility]. Please contact [facility] at [phone] with questions or concerns regarding your invoice.   Our billing staff will not be able to assist you with questions regarding bills from these companies.  You will be contacted with the lab results as soon as they are available. The fastest way to get your results is to activate your My Chart account. Instructions are located on the last page of this paperwork. If you have not heard from [place] regarding the results in 2 weeks, please contact this office.

## 2016-06-10 NOTE — Progress Notes (Addendum)
Patient ID: [name], male    DOB: May 16, 1980, 36 y.o.   MRN: [id number]  PCP: Yvetta Coder  Chief Complaint  Patient presents with  . Follow-up    meds, Wellbutrin "didn't help at all", depression score during triage 14    Subjective:  HPI  [name], [name], presents for depression follow-up. He was last seen here at PCP on 05/04/17 for depression and was placed on Wellbutrin 200 mg daily.  Pt has a past medical hx of PTSD, Major Depression, Uncontrolled Diabetes, and work related back injury which has led to a recent back surgery on 05/05/17.   Chronic Back Pain/Neuropathy Reports overall improved of back pain since surgery. He is currently managing pain during day with Robaxin about every 2 days.  He reports pain is worst at night due to unable to find a comfortable position in bed. The night time pain is worsening his chronic insomnia issue. He has tried trazodone for sleep, although, the night time pain and his inability to get into a comfortable position is  complicating his sleep efforts. He reports that the Lyrica was initially helpful with sciatica pain although he began to develop tolerance to the medication at the dose prescribed. Would like to continue with Lyrica if dose could be increased.   Depression/PTSD Patient denies thoughts of hopelessness, self-harm, or harm to others. Reports continues chronic episodes of sadness and inappropriate crying. He feels that the sleepless nights may be contributing to his depression and sadness. Reports years ago he did attend counseling through a licensed therapists, although since this most recent episode of depression over the last 1.5 years he hasn't obtained therapy. He is a prior patient of  Dr. Joseph Art was initially  placed on Celexa and he stopped taking and started on Trazadone for PTSD.   Social History   Social History  . Marital status: Married    Spouse name: N/A  . Number of children: N/A  . Years  of education: N/A   Occupational History  . Not on file.   Social History Main Topics  . Smoking status: Former Smoker    Types: Cigars  . Smokeless tobacco: Former Systems developer    Types: Snuff    Quit date: 04/14/2016  . Alcohol use Yes     Comment: few times year  . Drug use: No  . Sexual activity: Not on file   Other Topics Concern  . Not on file   Social History Narrative  . No narrative on file   No family history on file. Review of Systems See HPI Patient Active Problem List   Diagnosis Date Noted  . Spinal stenosis, lumbar region with neurogenic claudication 05/05/2016  . Left epididymitis 03/16/2016  . Dysthymia 09/02/2015  . Chronic low back pain 09/02/2015  . T2DM (type 2 diabetes mellitus) ([place]) 03/11/2008  . UNSPECIFIED ANEMIA 03/11/2008    Allergies  Allergen Reactions  . Amoxicillin Anaphylaxis and Other (See Comments)    Has patient had a PCN reaction causing immediate rash, facial/tongue/throat swelling, SOB or lightheadedness with hypotension: yes Has patient had a PCN reaction causing severe rash involving mucus membranes or skin necrosis: no Has patient had a PCN reaction that required hospitalization yes Has patient had a PCN reaction occurring within the last 10 years: yes If all of the above answers are "NO", then may proceed with Cephalosporin use.  [name] Penicillins Anaphylaxis and Other (See Comments)    Patient was told to list this as  an allergy because he is allergic to Amoxicillin Has patient had a PCN reaction causing immediate rash, facial/tongue/throat swelling, SOB or lightheadedness with hypotension: yes for Amoxicillin Has patient had a PCN reaction causing severe rash involving mucus membranes or skin necrosis: no Has patient had a PCN reaction that required hospitalization yes for Amoxicillin Has patient had a PCN reaction occurring within the last 10 years: yes for Amoxicillin If all o  . Other Other (See Comments)    Powder in some gloves -  localized itching but not allergic to latex, benadryl usually helps with this reaction    Prior to Admission medications   Medication Sig Start Date End Date Taking? Authorizing Provider  aspirin EC 81 MG tablet Take 1 tablet (81 mg total) by mouth daily. 07/11/14  Yes Shawnee Knapp, MD  chlorthalidone (HYGROTON) 25 MG tablet Take 1 tablet (25 mg total) by mouth daily. 10/23/15  Yes Robyn Haber, MD  diphenhydrAMINE (BENADRYL) 25 mg capsule Take 25-50 mg by mouth daily as needed for allergies (DEPENDS ON HOW BAD ALLERGIES IF TAKES 25-50 MG).   Yes Historical Provider, MD  fenofibrate (TRICOR) 145 MG tablet Take 1 tablet (145 mg total) by mouth daily. 02/27/16  Yes Elayne Snare, MD  ibuprofen (ADVIL,MOTRIN) 200 MG tablet Take 800 mg by mouth every 6 (six) hours as needed for mild pain.   Yes Historical Provider, MD  insulin aspart (NOVOLOG) 100 UNIT/ML FlexPen Inject 10 Units into the skin 3 (three) times daily with meals. 03/31/16  Yes Elayne Snare, MD  Insulin Glargine (LANTUS SOLOSTAR) 100 UNIT/ML Solostar Pen Inject 10 units twice a day Patient taking differently: 70 Units 2 (two) times daily. Patient takes am and supper 03/09/16  Yes Elayne Snare, MD  INVOKANA 300 MG TABS tablet TAKE 1 TABLET BY MOUTH EVERY DAY 05/31/16  Yes Timmothy Euler, MD  metFORMIN (GLUCOPHAGE-XR) 500 MG 24 hr tablet Take 4 tablets (2,000 mg total) by mouth daily with supper. 01/21/16  Yes Elayne Snare, MD  methocarbamol (ROBAXIN) 500 MG tablet Take 1 tablet (500 mg total) by mouth every 6 (six) hours as needed for muscle spasms. 05/05/16  Yes Latanya Maudlin, MD  ranitidine (ZANTAC) 150 MG tablet Take 1 tablet (150 mg total) by mouth 2 (two) times daily. 09/11/14  Yes Dorian Heckle English, PA  B-D UF III MINI PEN NEEDLES 31G X 5 MM MISC USE AS DIRECTED EVERY DAY Patient not taking: Reported on 06/10/2016 02/24/16   Timmothy Euler, MD  BAYER MICROLET LANCETS lancets Use as instructed to check blood sugar 4-5 times a week Dx code  E11.42 Patient not taking: Reported on 06/10/2016 01/21/16   Elayne Snare, MD  blood glucose meter kit and supplies KIT Dispense based on insurance preference. Use up to four times daily as directed. DX: E11.42 Patient not taking: Reported on 06/10/2016 11/27/15   Mancel Bale, PA-C  Blood Glucose Monitoring Suppl (CONTOUR NEXT EZ MONITOR) w/Device KIT Use to check blood sugar 4-5 times a week Dx code E11.42 Patient not taking: Reported on 06/10/2016 01/21/16   Elayne Snare, MD  buPROPion (WELLBUTRIN SR) 100 MG 12 hr tablet TAKE 1 TABLET (100 MG TOTAL) BY MOUTH 2 (TWO) TIMES DAILY. Patient not taking: Reported on 06/10/2016 05/31/16   Mancel Bale, PA-C  fluconazole (DIFLUCAN) 150 MG tablet Take 150 mg by mouth every 14 (fourteen) days.    Historical Provider, MD  glucose blood (BAYER CONTOUR NEXT TEST) test strip Use as instructed to  check blood sugar 4-5 times per week dx code E11.42 Patient not taking: Reported on 06/10/2016 01/21/16   Elayne Snare, MD  oxyCODONE-acetaminophen (PERCOCET/ROXICET) 5-325 MG tablet Take 1-2 tablets by mouth every 4 (four) hours as needed for moderate pain. Patient not taking: Reported on 06/10/2016 05/05/16   Latanya Maudlin, MD  pregabalin (LYRICA) 75 MG capsule Take 1 capsule (75 mg total) by mouth 3 (three) times daily as needed. Patient not taking: Reported on 06/10/2016 05/04/16   Sedalia Muta, FNP  traZODone (DESYREL) 150 MG tablet Take 1 tablet (150 mg total) by mouth at bedtime. Patient not taking: Reported on 06/10/2016 05/04/16   Sedalia Muta, FNP    Past Medical, Surgical Family and Social History reviewed and updated.    Objective:  There were no vitals filed for this visit.  Wt Readings from Last 3 Encounters:  05/05/16 (!) 337 lb 8 oz (153.1 kg)  05/04/16 (!) 337 lb 12.8 oz (153.2 kg)  04/28/16 (!) 330 lb (149.7 kg)   Physical Exam  Constitutional: He is oriented to person, place, and time. He appears well-developed and well-nourished.    HENT:  Head: Normocephalic and atraumatic.  Eyes: Pupils are equal, round, and reactive to light.  Neck: Normal range of motion. Neck supple.  Cardiovascular: Normal rate, regular rhythm, normal heart sounds and intact distal pulses.   Pulmonary/Chest: Effort normal and breath sounds normal.  Musculoskeletal: He exhibits tenderness.  Back pain and tenderness positive with positional changes from standing to sitting or prolonged sitting  Neurological: He is alert and oriented to person, place, and time.  Skin: Skin is warm and dry.  Psychiatric: His behavior is normal. Thought content normal. He exhibits a depressed mood.     Assessment & Plan:  1. PTSD (post-traumatic stress disorder) 2. Moderate episode of recurrent major depressive disorder ([place]) Plan: -Ambulatory referral to Psychiatry for further evaluation of PTSD and Depression   -Increase dose of Wellbutrin to 400 mg per day 3. Neuropathy  -Increase Lyrica 200 mg twice daily for neuropathic nerve pain  Return for care as needed.  Total of 25 minutes  spent with pt., greater than 50% which was spent in discussion of tx options of pain management for chronic back pain and depression.    Carroll Sage. Kenton Kingfisher, MSN, FNP-C [facility] [place]

## 2016-06-11 ENCOUNTER — Telehealth: Payer: Self-pay

## 2016-06-11 ENCOUNTER — Encounter: Payer: Self-pay | Admitting: Family Medicine

## 2016-06-11 NOTE — Telephone Encounter (Signed)
[id number] bcbs [id number] id  PA lyrica

## 2016-06-16 ENCOUNTER — Ambulatory Visit (INDEPENDENT_AMBULATORY_CARE_PROVIDER_SITE_OTHER): Payer: BLUE CROSS/BLUE SHIELD | Admitting: Emergency Medicine

## 2016-06-16 VITALS — BP 152/108 | HR 112 | Resp 18 | Ht 75.0 in

## 2016-06-16 DIAGNOSIS — T7840XA Allergy, unspecified, initial encounter: Secondary | ICD-10-CM

## 2016-06-16 MED ORDER — DIPHENHYDRAMINE HCL 50 MG/ML IJ SOLN
50.0000 mg | Freq: Once | INTRAMUSCULAR | Status: AC
  Administered 2016-06-16: 50 mg via INTRAMUSCULAR

## 2016-06-16 MED ORDER — METHYLPREDNISOLONE SODIUM SUCC 125 MG IJ SOLR
125.0000 mg | Freq: Once | INTRAMUSCULAR | Status: AC
  Administered 2016-06-16: 125 mg via INTRAVENOUS

## 2016-06-16 MED ORDER — PREDNISONE 20 MG PO TABS: 40.0000 mg | ORAL_TABLET | Freq: Every day | ORAL | 0 refills | Status: AC

## 2016-06-16 NOTE — Patient Instructions (Addendum)
     IF you received an x-ray today, you will receive an invoice from [facility]. Please contact [facility] at [phone] with questions or concerns regarding your invoice.   IF you received labwork today, you will receive an invoice from [facility]. Please contact [facility] at [phone] with questions or concerns regarding your invoice.   Our billing staff will not be able to assist you with questions regarding bills from these companies.  You will be contacted with the lab results as soon as they are available. The fastest way to get your results is to activate your My Chart account. Instructions are located on the last page of this paperwork. If you have not heard from [place] regarding the results in 2 weeks, please contact this office.     Allergies An allergy is when your body reacts to a substance in a way that is not normal. An allergic reaction can happen after you:  Eat something.  Breathe in something.  Touch something. WHAT KINDS OF ALLERGIES ARE THERE? You can be allergic to:  Things that are only around during certain seasons, like molds and pollens.  Foods.  Drugs.  Insects.  Animal dander. WHAT ARE SYMPTOMS OF ALLERGIES?  Puffiness (swelling). This may happen on the lips, face, tongue, mouth, or throat.  Sneezing.  Coughing.  Breathing loudly (wheezing).  Stuffy nose.  Tingling in the mouth.  A rash.  Itching.  Itchy, red, puffy areas of skin (hives).  Watery eyes.  Throwing up (vomiting).  Watery poop (diarrhea).  Dizziness.  Feeling faint or fainting.  Trouble breathing or swallowing.  A tight feeling in the chest.  A fast heartbeat. HOW ARE ALLERGIES DIAGNOSED? Allergies can be diagnosed with:  A medical and family history.  Skin tests.  Blood tests.  A food diary. A food diary is a record of all the foods, drinks, and symptoms you have each day.  The results of an elimination diet. This diet involves  making sure not to eat certain foods and then seeing what happens when you start eating them again. HOW ARE ALLERGIES TREATED? There is no cure for allergies, but allergic reactions can be treated with medicine. Severe reactions usually need to be treated at a hospital.  HOW CAN REACTIONS BE PREVENTED? The best way to prevent an allergic reaction is to avoid the thing you are allergic to. Allergy shots and medicines can also help prevent reactions in some cases. This information is not intended to replace advice given to you by your health care provider. Make sure you discuss any questions you have with your health care provider. Document Released: 08/21/2012 Document Revised: 05/17/2014 Document Reviewed: 02/05/2014 Elsevier Interactive Patient Education  2017 [employer].

## 2016-06-16 NOTE — Progress Notes (Signed)
[name] 36 y.o.   Chief Complaint  Patient presents with  . Allergic Reaction    Started Monday after root canal    HISTORY OF PRESENT ILLNESS: This is a 36 y.o. male complaining of possible allergic reaction to dental chemical used last Monday during dental procedure; feels trouble swallowing. Denies SOB, syncope, n/v, or any other significant symptoms. Allergic Reaction  This is a new problem. The current episode started 2 days ago. The problem occurs intermittently. The problem has been waxing and waning since onset. The problem is mild. Associated with: dental chemical. Associated symptoms include trouble swallowing. Pertinent negatives include no abdominal pain, chest pain, chest pressure, coughing, diarrhea, difficulty breathing, drooling, eye itching, eye redness, hyperventilation, itching, rash, stridor, vomiting or wheezing. There is no swelling present. Past treatments include diphenhydramine (once yesterday). The treatment provided mild relief. There is no history of asthma, food allergies or medication allergies.     Prior to Admission medications   Medication Sig Start Date End Date Taking? Authorizing Provider  aspirin EC 81 MG tablet Take 1 tablet (81 mg total) by mouth daily. 07/11/14  Yes Shawnee Knapp, MD  B-D UF III MINI PEN NEEDLES 31G X 5 MM MISC USE AS DIRECTED EVERY DAY 02/24/16  Yes Timmothy Euler, MD  BAYER MICROLET LANCETS lancets Use as instructed to check blood sugar 3  times daily  Dx code E11.42 06/10/16  Yes Sedalia Muta, FNP  blood glucose meter kit and supplies KIT Dispense based on insurance preference. Use up to four times daily as directed. DX: E11.42 11/27/15  Yes Mancel Bale, PA-C  Blood Glucose Monitoring Suppl (CONTOUR NEXT EZ MONITOR) w/Device KIT Use to check blood sugar 4-5 times a week Dx code E11.42 01/21/16  Yes Elayne Snare, MD  buPROPion (WELLBUTRIN SR) 200 MG 12 hr tablet Take 1 tablet (200 mg total) by mouth 2 (two) times daily.  06/10/16  Yes Sedalia Muta, FNP  chlorthalidone (HYGROTON) 25 MG tablet Take 1 tablet (25 mg total) by mouth daily. 10/23/15  Yes Robyn Haber, MD  diphenhydrAMINE (BENADRYL) 25 mg capsule Take 25-50 mg by mouth daily as needed for allergies (DEPENDS ON HOW BAD ALLERGIES IF TAKES 25-50 MG).   Yes Historical Provider, MD  fenofibrate (TRICOR) 145 MG tablet Take 1 tablet (145 mg total) by mouth daily. 02/27/16  Yes Elayne Snare, MD  fluconazole (DIFLUCAN) 150 MG tablet Take 150 mg by mouth every 14 (fourteen) days.   Yes Historical Provider, MD  glucose blood (BAYER CONTOUR NEXT TEST) test strip Use as instructed to check blood sugar 3 times daily dx code E11.42 06/10/16  Yes Sedalia Muta, FNP  ibuprofen (ADVIL,MOTRIN) 200 MG tablet Take 800 mg by mouth every 6 (six) hours as needed for mild pain.   Yes Historical Provider, MD  insulin aspart (NOVOLOG) 100 UNIT/ML FlexPen Inject 10 Units into the skin 3 (three) times daily with meals. 03/31/16  Yes Elayne Snare, MD  Insulin Glargine (LANTUS SOLOSTAR) 100 UNIT/ML Solostar Pen Inject 10 units twice a day Patient taking differently: 70 Units 2 (two) times daily. Patient takes am and supper 03/09/16  Yes Elayne Snare, MD  INVOKANA 300 MG TABS tablet TAKE 1 TABLET BY MOUTH EVERY DAY 05/31/16  Yes Timmothy Euler, MD  metFORMIN (GLUCOPHAGE-XR) 500 MG 24 hr tablet Take 4 tablets (2,000 mg total) by mouth daily with supper. 01/21/16  Yes Elayne Snare, MD  methocarbamol (ROBAXIN) 500 MG tablet Take 1  tablet (500 mg total) by mouth every 6 (six) hours as needed for muscle spasms. 05/05/16  Yes Latanya Maudlin, MD  pregabalin (LYRICA) 200 MG capsule Take 1 capsule (200 mg total) by mouth 2 (two) times daily. 06/10/16  Yes Sedalia Muta, FNP  ranitidine (ZANTAC) 150 MG tablet Take 1 tablet (150 mg total) by mouth 2 (two) times daily. 09/11/14  Yes Dorian Heckle English, PA  traZODone (DESYREL) 150 MG tablet Take 1 tablet (150 mg total) by mouth  at bedtime. 05/04/16  Yes Sedalia Muta, FNP  oxyCODONE-acetaminophen (PERCOCET/ROXICET) 5-325 MG tablet Take 1-2 tablets by mouth every 4 (four) hours as needed for moderate pain. Patient not taking: Reported on 06/10/2016 05/05/16   Latanya Maudlin, MD  predniSONE (DELTASONE) 20 MG tablet Take 2 tablets (40 mg total) by mouth daily with breakfast. 06/16/16 06/21/16  Horald Pollen, MD    Allergies  Allergen Reactions  . Amoxicillin Anaphylaxis and Other (See Comments)    Has patient had a PCN reaction causing immediate rash, facial/tongue/throat swelling, SOB or lightheadedness with hypotension: yes Has patient had a PCN reaction causing severe rash involving mucus membranes or skin necrosis: no Has patient had a PCN reaction that required hospitalization yes Has patient had a PCN reaction occurring within the last 10 years: yes If all of the above answers are "NO", then may proceed with Cephalosporin use.  [name] Penicillins Anaphylaxis and Other (See Comments)    Patient was told to list this as an allergy because he is allergic to Amoxicillin Has patient had a PCN reaction causing immediate rash, facial/tongue/throat swelling, SOB or lightheadedness with hypotension: yes for Amoxicillin Has patient had a PCN reaction causing severe rash involving mucus membranes or skin necrosis: no Has patient had a PCN reaction that required hospitalization yes for Amoxicillin Has patient had a PCN reaction occurring within the last 10 years: yes for Amoxicillin If all o  . Other Other (See Comments)    Powder in some gloves - localized itching but not allergic to latex, benadryl usually helps with this reaction    Patient Active Problem List   Diagnosis Date Noted  . Spinal stenosis, lumbar region with neurogenic claudication 05/05/2016  . Left epididymitis 03/16/2016  . Dysthymia 09/02/2015  . Chronic low back pain 09/02/2015  . T2DM (type 2 diabetes mellitus) ([place]) 03/11/2008  .  UNSPECIFIED ANEMIA 03/11/2008    Past Medical History:  Diagnosis Date  . Anemia   . Anxiety   . Depression   . Diabetes mellitus   . GERD (gastroesophageal reflux disease)   . Hypertension   . Lumbar disc herniation   . 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 PTSD (post-traumatic stress disorder)   . Scoliosis     Past Surgical History:  Procedure Laterality Date  . LUMBAR LAMINECTOMY/DECOMPRESSION MICRODISCECTOMY Left 05/05/2016   Procedure: CENTRAL DECOMPRESSION L4-L5 AND FORAMINOTOMY FOR L4 ROOT AND L5 ROOT ON THE LEFT;  Surgeon: Latanya Maudlin, MD;  Location: [facility];  Service: Orthopedics;  Laterality: Left;  . NO PAST SURGERIES      Social History   Social History  . Marital status: Married    Spouse name: N/A  . Number of children: N/A  . Years of education: N/A   Occupational History  . Not on file.   Social History Main Topics  . Smoking status: Former Smoker    Types: Cigars  . Smokeless tobacco: Former Systems developer  Types: Snuff    Quit date: 04/14/2016  . Alcohol use Yes     Comment: few times year  . Drug use: No  . Sexual activity: Not on file   Other Topics Concern  . Not on file   Social History Narrative  . No narrative on file    No family history on file.   Review of Systems  Constitutional: Negative for chills, fever and malaise/fatigue.  HENT: Positive for trouble swallowing. Negative for drooling, nosebleeds and sore throat.        Trouble swallowing, throat swelling  Eyes: Negative for blurred vision, discharge, redness and itching.  Respiratory: Negative for cough, hemoptysis, shortness of breath, wheezing and stridor.   Cardiovascular: Negative for chest pain, palpitations and leg swelling.  Gastrointestinal: Negative for abdominal pain, diarrhea, nausea and vomiting.  Genitourinary: Negative for dysuria and hematuria.  Musculoskeletal: Negative for myalgias and neck pain.  Skin: Negative for itching and  rash.  Neurological: Negative for dizziness, weakness and headaches.  All other systems reviewed and are negative.  Vitals:   06/16/16 0915  BP: (!) 152/108  Pulse: (!) 112  Resp: 18     Physical Exam  Constitutional: He is oriented to person, place, and time. He appears well-developed and well-nourished.  HENT:  Head: Normocephalic and atraumatic.  Mouth/Throat: Mucous membranes are normal. Posterior oropharyngeal erythema present. No oropharyngeal exudate, posterior oropharyngeal edema or tonsillar abscesses.  Eyes: Conjunctivae and EOM are normal. Pupils are equal, round, and reactive to light.  Neck: Normal range of motion. Neck supple. No JVD present. No thyromegaly present.  Cardiovascular: Normal rate, regular rhythm and normal heart sounds.   Pulmonary/Chest: Effort normal and breath sounds normal. He has no wheezes. He has no rales.  Abdominal: Soft. He exhibits no distension and no mass. There is no guarding.  Musculoskeletal: Normal range of motion.  Lymphadenopathy:    He has no cervical adenopathy.  Neurological: He is alert and oriented to person, place, and time. He displays normal reflexes. No sensory deficit. He exhibits normal muscle tone.  Skin: Skin is warm and dry. Capillary refill takes less than 2 seconds.  Psychiatric: He has a normal mood and affect. His behavior is normal.  Vitals reviewed.  Feels much better after injections. States symptoms are gone.  ASSESSMENT & PLAN: Navarre was seen today for allergic reaction.  Diagnoses and all orders for this visit:  Acute allergic reaction, initial encounter -     methylPREDNISolone sodium succinate (SOLU-MEDROL) 125 mg/2 mL injection 125 mg; Inject 2 mLs (125 mg total) into the vein once. -     diphenhydrAMINE (BENADRYL) injection 50 mg; Inject 1 mL (50 mg total) into the muscle once.  Other orders -     predniSONE (DELTASONE) 20 MG tablet; Take 2 tablets (40 mg total) by mouth daily with  breakfast.    Patient Instructions       IF you received an x-ray today, you will receive an invoice from [facility]. Please contact [facility] at [phone] with questions or concerns regarding your invoice.   IF you received labwork today, you will receive an invoice from [facility]. Please contact [facility] at [phone] with questions or concerns regarding your invoice.   Our billing staff will not be able to assist you with questions regarding bills from these companies.  You will be contacted with the lab results as soon as they are available. The fastest way to get your results is to activate your My  Chart account. Instructions are located on the last page of this paperwork. If you have not heard from [place] regarding the results in 2 weeks, please contact this office.     Allergies An allergy is when your body reacts to a substance in a way that is not normal. An allergic reaction can happen after you:  Eat something.  Breathe in something.  Touch something. WHAT KINDS OF ALLERGIES ARE THERE? You can be allergic to:  Things that are only around during certain seasons, like molds and pollens.  Foods.  Drugs.  Insects.  Animal dander. WHAT ARE SYMPTOMS OF ALLERGIES?  Puffiness (swelling). This may happen on the lips, face, tongue, mouth, or throat.  Sneezing.  Coughing.  Breathing loudly (wheezing).  Stuffy nose.  Tingling in the mouth.  A rash.  Itching.  Itchy, red, puffy areas of skin (hives).  Watery eyes.  Throwing up (vomiting).  Watery poop (diarrhea).  Dizziness.  Feeling faint or fainting.  Trouble breathing or swallowing.  A tight feeling in the chest.  A fast heartbeat. HOW ARE ALLERGIES DIAGNOSED? Allergies can be diagnosed with:  A medical and family history.  Skin tests.  Blood tests.  A food diary. A food diary is a record of all the foods, drinks, and symptoms you have each day.  The results  of an elimination diet. This diet involves making sure not to eat certain foods and then seeing what happens when you start eating them again. HOW ARE ALLERGIES TREATED? There is no cure for allergies, but allergic reactions can be treated with medicine. Severe reactions usually need to be treated at a hospital.  HOW CAN REACTIONS BE PREVENTED? The best way to prevent an allergic reaction is to avoid the thing you are allergic to. Allergy shots and medicines can also help prevent reactions in some cases. This information is not intended to replace advice given to you by your health care provider. Make sure you discuss any questions you have with your health care provider. Document Released: 08/21/2012 Document Revised: 05/17/2014 Document Reviewed: 02/05/2014 Elsevier Interactive Patient Education  2017 Elsevier Inc.      Agustina Caroli, MD [facility]

## 2016-06-17 NOTE — Telephone Encounter (Signed)
lmtcb at pharmacy to give [place] valid # to call for pa  It get no where with number given

## 2016-06-30 ENCOUNTER — Telehealth: Payer: Self-pay

## 2016-06-30 NOTE — Telephone Encounter (Signed)
L/m at pharmacy to call us back with PA info again, info given is not valid

## 2016-07-02 ENCOUNTER — Telehealth: Payer: Self-pay

## 2016-07-02 NOTE — Telephone Encounter (Signed)
CALLED PHARMACY AGAIN FOR CORRECT PA INFO FOR LYRICA (SEE PRIOR PHONE NOTES)  [phone] HELP DESK Spoke with sevina h. PA in process for lyrica

## 2016-07-02 NOTE — Telephone Encounter (Signed)
Pt calling to follow up on lyrica refill spoke with karen states they are waiting on prior auth and she will contact pharmacy

## 2016-07-05 NOTE — Telephone Encounter (Signed)
Form faxed to [place] Placed in [facility] box

## 2016-07-06 ENCOUNTER — Other Ambulatory Visit: Payer: Self-pay | Admitting: Family Medicine

## 2016-07-07 NOTE — Telephone Encounter (Signed)
PA forms completed by the provider and placed in nurse box.  Carroll Sage. Kenton Kingfisher, MSN, FNP-C [facility] [place]

## 2016-07-07 NOTE — Telephone Encounter (Signed)
FNP Harris prescribed this medication. I placed form in her box.

## 2016-07-07 NOTE — Telephone Encounter (Signed)
Form is in your box - please sign

## 2016-07-10 NOTE — Telephone Encounter (Signed)
lyrica approved Pharm advised

## 2016-07-25 ENCOUNTER — Other Ambulatory Visit: Payer: Self-pay | Admitting: Endocrinology

## 2016-08-02 ENCOUNTER — Telehealth: Payer: Self-pay | Admitting: Urgent Care

## 2016-08-02 NOTE — Telephone Encounter (Signed)
Pt is needing to talk with someone about increasing his dosage of lyrica   Best number [phone]

## 2016-08-03 ENCOUNTER — Other Ambulatory Visit: Payer: Self-pay | Admitting: Family Medicine

## 2016-08-05 NOTE — Telephone Encounter (Signed)
Last discussed with [name] increasing dose.  On 200mg  bid now from 75mg  bid  lyrica  Constant pain from neuropathy  Off gabepentin. Next step?

## 2016-08-06 NOTE — Telephone Encounter (Signed)
Please contact patient. He should come in for further evaluation as 200mg  BID is a high dose. The max is 600mg  a day and there has not been much data supporting that doses over 300mg  a day are more effective. Thanks!

## 2016-08-07 NOTE — Telephone Encounter (Signed)
Pt advised and will f/u with brittany  Next week

## 2016-08-08 ENCOUNTER — Encounter (HOSPITAL_COMMUNITY): Payer: Self-pay | Admitting: Emergency Medicine

## 2016-08-08 ENCOUNTER — Emergency Department (HOSPITAL_COMMUNITY)
Admission: EM | Admit: 2016-08-08 | Discharge: 2016-08-08 | Disposition: A | Discharge status: Home / Self Care | Payer: BLUE CROSS/BLUE SHIELD | Attending: Emergency Medicine | Admitting: Emergency Medicine

## 2016-08-08 DIAGNOSIS — Z87891 Personal history of nicotine dependence: Secondary | ICD-10-CM | POA: Diagnosis not present

## 2016-08-08 DIAGNOSIS — Z7982 Long term (current) use of aspirin: Secondary | ICD-10-CM | POA: Insufficient documentation

## 2016-08-08 DIAGNOSIS — M62830 Muscle spasm of back: Secondary | ICD-10-CM

## 2016-08-08 DIAGNOSIS — Y939 Activity, unspecified: Secondary | ICD-10-CM | POA: Diagnosis not present

## 2016-08-08 DIAGNOSIS — G8929 Other chronic pain: Secondary | ICD-10-CM | POA: Insufficient documentation

## 2016-08-08 DIAGNOSIS — E1140 Type 2 diabetes mellitus with diabetic neuropathy, unspecified: Secondary | ICD-10-CM | POA: Insufficient documentation

## 2016-08-08 DIAGNOSIS — M545 Low back pain: Secondary | ICD-10-CM | POA: Diagnosis present

## 2016-08-08 DIAGNOSIS — M5442 Lumbago with sciatica, left side: Secondary | ICD-10-CM | POA: Diagnosis not present

## 2016-08-08 DIAGNOSIS — Z79899 Other long term (current) drug therapy: Secondary | ICD-10-CM | POA: Insufficient documentation

## 2016-08-08 DIAGNOSIS — Y92410 Unspecified street and highway as the place of occurrence of the external cause: Secondary | ICD-10-CM | POA: Insufficient documentation

## 2016-08-08 DIAGNOSIS — Y999 Unspecified external cause status: Secondary | ICD-10-CM | POA: Diagnosis not present

## 2016-08-08 MED ORDER — TIZANIDINE HCL 2 MG PO TABS: 2.0000 mg | ORAL_TABLET | Freq: Four times a day (QID) | ORAL | 0 refills | Status: DC | PRN

## 2016-08-08 MED ORDER — PREDNISONE 20 MG PO TABS: ORAL_TABLET | ORAL | 0 refills | Status: DC

## 2016-08-08 MED ORDER — MELOXICAM 15 MG PO TABS: 15.0000 mg | ORAL_TABLET | Freq: Every day | ORAL | 0 refills | Status: DC

## 2016-08-08 NOTE — ED Notes (Signed)
Pt ambulatory and independent at discharge.  Verbalized understanding of discharge instructions 

## 2016-08-08 NOTE — Discharge Instructions (Signed)
Take mobic daily as directed; this replaces NSAIDs like ibuprofen/aleve/etc; you should not take any other over the counter NSAIDs since you'll be taking mobic instead. Use tylenol as needed for breakthrough pain. Stop taking your robaxin and switch to zanaflex for muscle relaxation. Do not drive or operate machinery with muscle relaxant use. Use heat  to areas of soreness, no more than 20 minutes at a time every hour. Take prednisone as directed, which will help with the tingling sensation; keep in mind that this will make your sugars go up, take your usual home insulin and don't be alarmed at the slight increase in sugar values. Expect to be sore for the next few days and follow up with your orthopedist/neurosurgeon for recheck of ongoing symptoms in the next 1-2 weeks. Return to ER for emergent changing or worsening of symptoms.
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
## 2016-08-09 ENCOUNTER — Other Ambulatory Visit: Payer: Self-pay | Admitting: Family Medicine

## 2016-08-10 ENCOUNTER — Emergency Department (HOSPITAL_COMMUNITY)
Admission: EM | Admit: 2016-08-10 | Discharge: 2016-08-11 | Disposition: A | Discharge status: Home / Self Care | Payer: BLUE CROSS/BLUE SHIELD | Attending: Emergency Medicine | Admitting: Emergency Medicine

## 2016-08-10 DIAGNOSIS — Z79899 Other long term (current) drug therapy: Secondary | ICD-10-CM | POA: Diagnosis not present

## 2016-08-10 DIAGNOSIS — E1140 Type 2 diabetes mellitus with diabetic neuropathy, unspecified: Secondary | ICD-10-CM | POA: Insufficient documentation

## 2016-08-10 DIAGNOSIS — Z87891 Personal history of nicotine dependence: Secondary | ICD-10-CM | POA: Insufficient documentation

## 2016-08-10 DIAGNOSIS — Z7982 Long term (current) use of aspirin: Secondary | ICD-10-CM | POA: Insufficient documentation

## 2016-08-10 DIAGNOSIS — F4310 Post-traumatic stress disorder, unspecified: Secondary | ICD-10-CM | POA: Insufficient documentation

## 2016-08-10 DIAGNOSIS — I10 Essential (primary) hypertension: Secondary | ICD-10-CM | POA: Diagnosis not present

## 2016-08-10 DIAGNOSIS — R443 Hallucinations, unspecified: Secondary | ICD-10-CM | POA: Diagnosis present

## 2016-08-10 NOTE — Telephone Encounter (Signed)
Forwarding to PCP at [facility] clinical message pool and to TL to handle. I am no longer at PCP.  Thanks  Carroll Sage. Kenton Kingfisher, MSN, [facility] [address] [place], [place] [address] [phone]

## 2016-08-10 NOTE — ED Provider Notes (Signed)
[facility] DEPT Provider Note   CSN: [id number] Arrival date & time: 08/10/16  2246   By signing my name below, I, [name], attest that this documentation has been prepared under the direction and in the presence of Orpah Greek, MD  Electronically Signed: [name], ED Scribe. 08/10/16. 12:36 AM.   History   Chief Complaint No chief complaint on file.   HPI Comments:  [name] is a 36 y.o. male who presents to the Emergency Department after being IVC'd by his wife today. Pt states his wife IVC'd him since he left her and took his children to his parents house. Per IVC papers, the wife notes the pt has been having hallucinations and states he does not want to live anymore. During interview the pt reports he does have hallucinations secondary to PTSD and depression and notes he is on Trazodone and Wellbutrin. No alleviating or modifying factors noted. He denies suicidal ideation, homicidal ideation and any other associated symptoms. No other complaints noted.   The history is provided by the patient. No language interpreter was used.    Past Medical History:  Diagnosis Date  . Anemia   . Anxiety   . Depression   . Diabetes mellitus   . GERD (gastroesophageal reflux disease)   . Hypertension   . Lumbar disc herniation   . Neuromuscular disorder ([place])    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   . PTSD (post-traumatic stress disorder)   . Scoliosis     Patient Active Problem List   Diagnosis Date Noted  . Spinal stenosis, lumbar region with neurogenic claudication 05/05/2016  . Left epididymitis 03/16/2016  . Dysthymia 09/02/2015  . Chronic low back pain 09/02/2015  . T2DM (type 2 diabetes mellitus) ([place]) 03/11/2008  . UNSPECIFIED ANEMIA 03/11/2008    Past Surgical History:  Procedure Laterality Date  . LUMBAR LAMINECTOMY/DECOMPRESSION MICRODISCECTOMY Left 05/05/2016   Procedure: CENTRAL DECOMPRESSION L4-L5 AND FORAMINOTOMY FOR L4 ROOT AND L5 ROOT ON  THE LEFT;  Surgeon: Latanya Maudlin, MD;  Location: [facility];  Service: Orthopedics;  Laterality: Left;  . NO PAST SURGERIES         Home Medications    Prior to Admission medications   Medication Sig Start Date End Date Taking? Authorizing Provider  aspirin EC 81 MG tablet Take 1 tablet (81 mg total) by mouth daily. 07/11/14  Yes Shawnee Knapp, MD  buPROPion (WELLBUTRIN SR) 200 MG 12 hr tablet Take 1 tablet (200 mg total) by mouth 2 (two) times daily. 06/10/16  Yes Sedalia Muta, FNP  chlorthalidone (HYGROTON) 25 MG tablet Take 1 tablet (25 mg total) by mouth daily. 10/23/15  Yes Robyn Haber, MD  diphenhydrAMINE (BENADRYL) 25 mg capsule Take 25-50 mg by mouth daily as needed for allergies (DEPENDS ON HOW BAD ALLERGIES IF TAKES 25-50 MG).   Yes Historical Provider, MD  fenofibrate (TRICOR) 145 MG tablet TAKE 1 TABLET BY MOUTH DAILY. 07/25/16  Yes Elayne Snare, MD  fluconazole (DIFLUCAN) 150 MG tablet Take 150 mg by mouth every 30 (thirty) days.    Yes Historical Provider, MD  ibuprofen (ADVIL,MOTRIN) 200 MG tablet Take 800 mg by mouth every 6 (six) hours as needed for mild pain.   Yes Historical Provider, MD  insulin aspart (NOVOLOG) 100 UNIT/ML FlexPen Inject 10 Units into the skin 3 (three) times daily with meals. 03/31/16  Yes Elayne Snare, MD  INVOKANA 300 MG TABS tablet TAKE 1 TABLET BY MOUTH EVERY DAY 08/10/16  Yes Joelene Millin  Larrie Kass, FNP  LANTUS SOLOSTAR 100 UNIT/ML Solostar Pen INJECT 10 UNITS TWICE A DAY 07/25/16  Yes Elayne Snare, MD  metFORMIN (GLUCOPHAGE-XR) 500 MG 24 hr tablet TAKE 4 TABLETS BY MOUTH DAILY WITH SUPPER. Patient taking differently: TAKE 2 TABLETS TWICE DAILY 07/25/16  Yes Elayne Snare, MD  methocarbamol (ROBAXIN) 500 MG tablet Take 1 tablet (500 mg total) by mouth every 6 (six) hours as needed for muscle spasms. 05/05/16  Yes Latanya Maudlin, MD  pregabalin (LYRICA) 200 MG capsule Take 1 capsule (200 mg total) by mouth 2 (two) times daily. 06/10/16  Yes Sedalia Muta, FNP  ranitidine (ZANTAC) 150 MG tablet Take 1 tablet (150 mg total) by mouth 2 (two) times daily. 09/11/14  Yes Dorian Heckle English, PA  traZODone (DESYREL) 150 MG tablet Take 1 tablet (150 mg total) by mouth at bedtime. 05/04/16  Yes Sedalia Muta, FNP  B-D UF III MINI PEN NEEDLES 31G X 5 MM MISC USE AS DIRECTED EVERY DAY 02/24/16   Timmothy Euler, MD  BAYER MICROLET LANCETS lancets Use as instructed to check blood sugar 3  times daily  Dx code E11.42 06/10/16   Sedalia Muta, FNP  blood glucose meter kit and supplies KIT Dispense based on insurance preference. Use up to four times daily as directed. DX: E11.42 11/27/15   Mancel Bale, PA-C  Blood Glucose Monitoring Suppl (CONTOUR NEXT EZ MONITOR) w/Device KIT Use to check blood sugar 4-5 times a week Dx code E11.42 01/21/16   Elayne Snare, MD  glucose blood (BAYER CONTOUR NEXT TEST) test strip Use as instructed to check blood sugar 3 times daily dx code E11.42 06/10/16   Sedalia Muta, FNP  meloxicam (MOBIC) 15 MG tablet Take 1 tablet (15 mg total) by mouth daily. TAKE WITH MEALS 08/08/16   Mercedes Street, PA-C  oxyCODONE-acetaminophen (PERCOCET/ROXICET) 5-325 MG tablet Take 1-2 tablets by mouth every 4 (four) hours as needed for moderate pain. Patient not taking: Reported on 06/10/2016 05/05/16   Latanya Maudlin, MD  predniSONE (DELTASONE) 20 MG tablet 2 tabs po daily x 4 days 08/08/16   Mercedes Street, PA-C  tiZANidine (ZANAFLEX) 2 MG tablet Take 1 tablet (2 mg total) by mouth every 6 (six) hours as needed for muscle spasms. 08/08/16   [address]cedes [address], PA-C    Family History No family history on file.  Social History Social History  Substance Use Topics  . Smoking status: Former Smoker    Types: Cigars  . Smokeless tobacco: Former Systems developer    Types: Snuff    Quit date: 04/14/2016  . Alcohol use Yes     Comment: few times year     Allergies   Amoxicillin; Penicillins; and Other   Review of  Systems Review of Systems  Constitutional: Negative for fever.  Psychiatric/Behavioral: Positive for hallucinations. Negative for suicidal ideas.  All other systems reviewed and are negative.    Physical Exam Updated Vital Signs BP (!) 133/93   Pulse 85   Temp 98.6 F (37 C)   Resp 17   SpO2 99%   Physical Exam  Constitutional: He is oriented to person, place, and time. He appears well-developed and well-nourished. No distress.  HENT:  Head: Normocephalic and atraumatic.  Right Ear: Hearing normal.  Left Ear: Hearing normal.  Nose: Nose normal.  Mouth/Throat: Oropharynx is clear and moist and mucous membranes are normal.  Eyes: Conjunctivae and EOM are normal. Pupils are equal, round, and reactive to light.  Neck: Normal range of motion. Neck supple.  Cardiovascular: Regular rhythm, S1 normal and S2 normal.  Exam reveals no gallop and no friction rub.   No murmur heard. Pulmonary/Chest: Effort normal and breath sounds normal. No respiratory distress. He exhibits no tenderness.  Abdominal: Soft. Normal appearance and bowel sounds are normal. There is no hepatosplenomegaly. There is no tenderness. There is no rebound, no guarding, no tenderness at McBurney's point and negative Murphy's sign. No hernia.  Musculoskeletal: Normal range of motion.  Neurological: He is alert and oriented to person, place, and time. He has normal strength. No cranial nerve deficit or sensory deficit. Coordination normal. GCS eye subscore is 4. GCS verbal subscore is 5. GCS motor subscore is 6.  Skin: Skin is warm, dry and intact. No rash noted. No cyanosis.  Psychiatric: He has a normal mood and affect. His speech is normal and behavior is normal. Thought content normal.  Nursing note and vitals reviewed.    ED Treatments / Results  DIAGNOSTIC STUDIES:  Oxygen Saturation is 99% on RA, normal by my interpretation.    COORDINATION OF CARE:  12:33 AM Discussed treatment plan with pt at bedside and  pt agreed to plan.  Labs (all labs ordered are listed, but only abnormal results are displayed) Labs Reviewed - No data to display  EKG  EKG Interpretation None       Radiology No results found.  Procedures Procedures (including critical care time)  Medications Ordered in ED Medications - No data to display   Initial Impression / Assessment and Plan / ED Course  I have reviewed the triage vital signs and the nursing notes.  Pertinent labs & imaging results that were available during my care of the patient were reviewed by me and considered in my medical decision making (see chart for details).     Patient presents to the emergency department for evaluation of IVC. Patient's wife initiated involuntary commitment paperwork tonight. Paperwork alleges that the patient has been hallucinating and depressed. He reports a history of PTSD and depression, but has been taking his medications. He is not homicidal or suicidal currently. Will have TTS evaluation and psychiatric evaluation to determine if he requires treatment with IVC can be terminated.  Final Clinical Impressions(s) / ED Diagnoses   Final diagnoses:  PTSD (post-traumatic stress disorder)    New Prescriptions New Prescriptions   No medications on file  I personally performed the services described in this documentation, which was scribed in my presence. The recorded information has been reviewed and is accurate.     Orpah Greek, MD 08/11/16 [phone]

## 2016-08-10 NOTE — Telephone Encounter (Signed)
Will forward to [facility] providers.    Laroy Apple, MD 08/10/2016, 8:11 AM

## 2016-08-11 DIAGNOSIS — F4310 Post-traumatic stress disorder, unspecified: Secondary | ICD-10-CM | POA: Diagnosis present

## 2016-08-11 LAB — CBC
HEMATOCRIT: 40.5 % (ref 39.0–52.0)
Hemoglobin: 13.5 g/dL (ref 13.0–17.0)
MCH: 25.9 pg — ABNORMAL LOW (ref 26.0–34.0)
MCHC: 33.3 g/dL (ref 30.0–36.0)
MCV: 77.6 fL — ABNORMAL LOW (ref 78.0–100.0)
PLATELETS: 293 10*3/uL (ref 150–400)
RBC: 5.22 MIL/uL (ref 4.22–5.81)
RDW: 14.8 % (ref 11.5–15.5)
WBC: 12.7 10*3/uL — ABNORMAL HIGH (ref 4.0–10.5)

## 2016-08-11 LAB — COMPREHENSIVE METABOLIC PANEL
ALBUMIN: 4.1 g/dL (ref 3.5–5.0)
ALK PHOS: 40 U/L (ref 38–126)
ALT: 25 U/L (ref 17–63)
ANION GAP: 9 (ref 5–15)
AST: 21 U/L (ref 15–41)
BILIRUBIN TOTAL: 0.8 mg/dL (ref 0.3–1.2)
BUN: 15 mg/dL (ref 6–20)
CALCIUM: 9.4 mg/dL (ref 8.9–10.3)
CO2: 27 mmol/L (ref 22–32)
Chloride: 100 mmol/L — ABNORMAL LOW (ref 101–111)
Creatinine, Ser: 1.01 mg/dL (ref 0.61–1.24)
GLUCOSE: 236 mg/dL — AB (ref 65–99)
POTASSIUM: 3.6 mmol/L (ref 3.5–5.1)
Sodium: 136 mmol/L (ref 135–145)
TOTAL PROTEIN: 7.7 g/dL (ref 6.5–8.1)

## 2016-08-11 LAB — SALICYLATE LEVEL: Salicylate Lvl: <7 mg/dL (ref 2.8–30.0)

## 2016-08-11 LAB — CBG MONITORING, ED: GLUCOSE-CAPILLARY: 227 mg/dL — AB (ref 65–99)

## 2016-08-11 LAB — RAPID URINE DRUG SCREEN, HOSP PERFORMED
Amphetamines: NOT DETECTED
BARBITURATES: NOT DETECTED
Benzodiazepines: NOT DETECTED
COCAINE: NOT DETECTED
Opiates: NOT DETECTED
Tetrahydrocannabinol: NOT DETECTED

## 2016-08-11 LAB — ETHANOL: Alcohol, Ethyl (B): <5 mg/dL (ref <5)

## 2016-08-11 LAB — ACETAMINOPHEN LEVEL

## 2016-08-11 MED ORDER — BUPROPION HCL ER (SR) 100 MG PO TB12
200.0000 mg | ORAL_TABLET | Freq: Two times a day (BID) | ORAL | Status: DC
  Filled 2016-08-11: qty 2

## 2016-08-11 MED ORDER — METHOCARBAMOL 500 MG PO TABS: 500.0000 mg | ORAL_TABLET | Freq: Four times a day (QID) | ORAL | Status: DC | PRN

## 2016-08-11 MED ORDER — FAMOTIDINE 20 MG PO TABS: 20.0000 mg | ORAL_TABLET | Freq: Every day | ORAL | Status: DC

## 2016-08-11 MED ORDER — DIPHENHYDRAMINE HCL 25 MG PO CAPS: 25.0000 mg | ORAL_CAPSULE | Freq: Every day | ORAL | Status: DC | PRN

## 2016-08-11 MED ORDER — METFORMIN HCL ER 500 MG PO TB24
1000.0000 mg | ORAL_TABLET | Freq: Two times a day (BID) | ORAL | Status: DC
  Administered 2016-08-11: 1000 mg via ORAL
  Filled 2016-08-11: qty 2

## 2016-08-11 MED ORDER — CHLORTHALIDONE 25 MG PO TABS
25.0000 mg | ORAL_TABLET | Freq: Every day | ORAL | Status: DC
  Filled 2016-08-11: qty 1

## 2016-08-11 MED ORDER — INSULIN GLARGINE 100 UNIT/ML ~~LOC~~ SOLN
10.0000 [IU] | Freq: Two times a day (BID) | SUBCUTANEOUS | Status: DC
  Administered 2016-08-11: 10 [IU] via SUBCUTANEOUS
  Filled 2016-08-11: qty 0.1

## 2016-08-11 MED ORDER — INSULIN ASPART 100 UNIT/ML ~~LOC~~ SOLN: 10.0000 [IU] | Freq: Three times a day (TID) | SUBCUTANEOUS | Status: DC

## 2016-08-11 MED ORDER — TRAZODONE HCL 100 MG PO TABS: 150.0000 mg | ORAL_TABLET | Freq: Every day | ORAL | Status: DC

## 2016-08-11 MED ORDER — CANAGLIFLOZIN 300 MG PO TABS
300.0000 mg | ORAL_TABLET | Freq: Every day | ORAL | Status: DC
  Administered 2016-08-11: 300 mg via ORAL
  Filled 2016-08-11: qty 1

## 2016-08-11 MED ORDER — PREGABALIN 50 MG PO CAPS: 200.0000 mg | ORAL_CAPSULE | Freq: Two times a day (BID) | ORAL | Status: DC

## 2016-08-11 MED ORDER — FENOFIBRATE 160 MG PO TABS
160.0000 mg | ORAL_TABLET | Freq: Every day | ORAL | Status: DC
  Filled 2016-08-11: qty 1

## 2016-08-11 MED ORDER — ASPIRIN EC 81 MG PO TBEC
81.0000 mg | DELAYED_RELEASE_TABLET | Freq: Every day | ORAL | Status: DC
  Filled 2016-08-11: qty 1

## 2016-08-11 NOTE — ED Notes (Signed)
TTS at bedside. 

## 2016-08-11 NOTE — BH Assessment (Signed)
[place] Assessment Progress Note  Per Corena Pilgrim, MD, this pt does not require psychiatric hospitalization at this time.  Pt presents under IVC initiated by his spouse, which Dr Darleene Cleaver has rescinded.  Pt is to be discharged from [facility] with recommendation to continue treatment with Primary Care at [facility], his current outpatient provider.  This has been included in pt's discharge instructions.  Pt's nurse has been notified.  Jalene Mullet, [place] Triage Specialist [phone]

## 2016-08-11 NOTE — Discharge Instructions (Signed)
For your ongoing behavioral health needs, you are advised to continue treatment with Primary Care at [facility]:       Primary Care at [address]      [place], [place] [address]      [phone]

## 2016-08-11 NOTE — Progress Notes (Signed)
CSW spoke with patients father, Belenda Cruise, who was currently in route to [facility]. Father confirms patient is currently living with parents and is able to provide transportation for patient back home.   Kingsley Spittle, LCSWA Clinical Social Worker [phone]

## 2016-08-11 NOTE — BH Assessment (Addendum)
Assessment Note  [name] is an 36 y.o. male with history of anxiety and depression. He presents to [facility] brought by [facility], IVC in place. [name] was IVC'd by his spouse. The IVC states : "[name] left wife and took children to his parents house. Per IVC papers, the wife notes the [name] has been having hallucinations and states he does not want to live anymore."  Writer met with [name] face to face to complete a TTS consult. [name] is married with 2 children. He explains that he doesn't want to be married to his spouse anymore. He has asked her for a divorce. [name] sts he is tired of arguing with his spouse on a regular basis. Sts they had an argument last Friday so he left the home with his children. [name] waited to his wife went to get cigarettes so that he could leave without her knowledge. He and his children went to his mothers home.   [name] denies suicidal ideations. He denies ever stating that he wanted to harm himself. He denies a history of suicide attempts or gestures. He denies self mutilating behaviors. He does admit to depressive symptoms triggered by marital conflict, PTSD, and chronic back pain. 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 [name] has not worked since last year and sts this makes him feel depressed. He occasionally has feelings of hopelessness and loss of interest in usual pleasures. His appetite and sleep are both normal. He is prescribed Trazadone and Wellbutrin by his PCP. Denies current AVH's. However, sts that occasionally he experiences visual hallucinations of shadows due to the Trazadone. [name] sts that although he see's shadows occasionally he never looses touch of reality.   Denies HI. He is calm and cooperative, pleasant. Denies legal issues. He denies drug use and drinks socially only. He has no history of INPT mental health treatment. No history of outpatient mental health history.   [name] requesting to  discharge home. Sts that his wife IVC'd him to "Get me back for asking for a divorce and leaving with my kids for several days". [name]'s insight and judgement are both fair. He is dressed in scrubs. He is oriented to time, person, place, and situation.   Diagnosis: Depression; Anxiety Disorder; and PTSD  Past Medical History:  Past Medical History:  Diagnosis Date  . Anemia   . Anxiety   . Depression   . Diabetes mellitus   . GERD (gastroesophageal reflux disease)   . Hypertension   . Lumbar disc herniation   . 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 PTSD (post-traumatic stress disorder)   . Scoliosis     Past Surgical History:  Procedure Laterality Date  . LUMBAR LAMINECTOMY/DECOMPRESSION MICRODISCECTOMY Left 05/05/2016   Procedure: CENTRAL DECOMPRESSION L4-L5 AND FORAMINOTOMY FOR L4 ROOT AND L5 ROOT ON THE LEFT;  Surgeon: Latanya Maudlin, MD;  Location: [facility];  Service: Orthopedics;  Laterality: Left;  . NO PAST SURGERIES      Family History: No family history on file.  Social History:  reports that he has quit smoking. His smoking use included Cigars. He quit smokeless tobacco use about 3 months ago. His smokeless tobacco use included Snuff. He reports that he drinks alcohol. He reports that he does not use drugs.  Additional Social History:  Alcohol / Drug Use Pain Medications: SEE MAR Prescriptions: Trazodone and Wellbutrin  History of alcohol / drug use?: No history of  alcohol / drug abuse  CIWA: CIWA-Ar BP: (!) 112/55 Pulse Rate: 86 COWS:    Allergies:  Allergies  Allergen Reactions  . Amoxicillin Anaphylaxis and Other (See Comments)    Has [name] had a PCN reaction causing immediate rash, facial/tongue/throat swelling, SOB or lightheadedness with hypotension: yes Has [name] had a PCN reaction causing severe rash involving mucus membranes or skin necrosis: no Has [name] had a PCN reaction that required hospitalization yes Has  [name] had a PCN reaction occurring within the last 10 years: yes If all of the above answers are "NO", then may proceed with Cephalosporin use.  [name] Penicillins Anaphylaxis and Other (See Comments)    [name] was told to list this as an allergy because he is allergic to Amoxicillin Has [name] had a PCN reaction causing immediate rash, facial/tongue/throat swelling, SOB or lightheadedness with hypotension: yes for Amoxicillin Has [name] had a PCN reaction causing severe rash involving mucus membranes or skin necrosis: no Has [name] had a PCN reaction that required hospitalization yes for Amoxicillin Has [name] had a PCN reaction occurring within the last 10 years: yes for Amoxicillin If all o  . Other Other (See Comments)    Powder in some gloves - localized itching but not allergic to latex, benadryl usually helps with this reaction    Home Medications:  (Not in a hospital admission)  OB/GYN Status:  No LMP for male [name].  General Assessment Data Location of Assessment: [facility] TTS Assessment: In system Is this a Tele or Face-to-Face Assessment?: Face-to-Face Is this an Initial Assessment or a Re-assessment for this encounter?: Initial Assessment Marital status: Married Wesson name:  (n/a) Is [name] pregnant?: No Pregnancy Status: No Living Arrangements: Spouse/significant other, Children Can pt return to current living arrangement?: Yes Admission Status: Involuntary Is [name] capable of signing voluntary admission?: Yes Referral Source: Self/Family/Friend Insurance type:  Nurse, mental health)     Crisis Care Plan Living Arrangements: Spouse/significant other, Children Legal Guardian: Other: (no legal guardian) Name of Psychiatrist:  (no psychiatrist ) Name of Therapist:  (no therapist )  Education Status Is [name] currently in school?: No Current Grade:  (n/a) Highest grade of school [name] has completed:  (some college ) Name of school:  (n/a)  Risk to self with the past  6 months Suicidal Ideation: No Has [name] been a risk to self within the past 6 months prior to admission? : No Suicidal Intent: No Has [name] had any suicidal intent within the past 6 months prior to admission? : No Is [name] at risk for suicide?: No Suicidal Plan?: No Has [name] had any suicidal plan within the past 6 months prior to admission? : No Access to Means: No What has been your use of drugs/alcohol within the last 12 months?:  ([name] denies ) Previous Attempts/Gestures: No How many times?:  (0) Other Self Harm Risks:  (no self harm ) Triggers for Past Attempts:  (no previous attempts or gestures) Intentional Self Injurious Behavior: None Family Suicide History: No Recent stressful life event(s): Other (Comment) (Wife getting back at me.Marland KitchenMarland KitchenI don't want to be married to her") Persecutory voices/beliefs?: No Depression: Yes Depression Symptoms: Loss of interest in usual pleasures, Guilt, Fatigue Substance abuse history and/or treatment for substance abuse?: No  Risk to Others within the past 6 months Homicidal Ideation: No Does [name] have any lifetime risk of violence toward others beyond the six months prior to admission? : No Thoughts of Harm to Others: No Current Homicidal Intent: No Current Homicidal Plan:  No Access to Homicidal Means: No Identified Victim:  (n/a) History of harm to others?: No Assessment of Violence: None Noted Violent Behavior Description:  ([name] is calm and cooperative ) Does [name] have access to weapons?: No Criminal Charges Pending?: No Does [name] have a court date: No Is [name] on probation?: No  Psychosis Hallucinations: Auditory Wallie Renshaw. hallucinations w/ taking Trazadone") Delusions: None noted  Mental Status Report Appearance/Hygiene: In scrubs Eye Contact: Good Motor Activity: Freedom of movement Speech: Logical/coherent Level of Consciousness: Alert Mood: Depressed Affect: Sad Anxiety Level: Minimal Thought  Processes: Relevant Judgement: Unimpaired Orientation: Person, Place, Time, Situation Obsessive Compulsive Thoughts/Behaviors: None  Cognitive Functioning Concentration: Decreased Memory: Recent Intact, Remote Intact IQ: Average Insight: Fair Impulse Control: Fair Appetite: Fair Weight Loss:  (none reported ) Weight Gain:  (none reported) Sleep: Decreased Total Hours of Sleep:  ("I get six hrs of sleep per night") Vegetative Symptoms: None  ADLScreening [facility] Assessment Services) [name]'s cognitive ability adequate to safely complete daily activities?: Yes [name] able to express need for assistance with ADLs?: Yes Independently performs ADLs?: Yes (appropriate for developmental age)  Prior Inpatient Therapy Prior Inpatient Therapy: Yes Prior Therapy Dates:  (n/a) Prior Therapy Facilty/Provider(s):  (n/a) Reason for Treatment:  (n/a)  Prior Outpatient Therapy Prior Outpatient Therapy: No Prior Therapy Dates:  (n/a) Prior Therapy Facilty/Provider(s):  (n/a) Reason for Treatment:  (n/a) Does [name] have an ACCT team?: No Does [name] have Intensive In-House Services?  : No Does [name] have [facility] services? : No Does [name] have P4CC services?: No  ADL Screening (condition at time of admission) [name]'s cognitive ability adequate to safely complete daily activities?: Yes Is the [name] deaf or have difficulty hearing?: No Does the [name] have difficulty seeing, even when wearing glasses/contacts?: No Does the [name] have difficulty concentrating, remembering, or making decisions?: No [name] able to express need for assistance with ADLs?: Yes Does the [name] have difficulty dressing or bathing?: No Independently performs ADLs?: Yes (appropriate for developmental age) Does the [name] have difficulty walking or climbing stairs?: No Weakness of Legs: Left Weakness of Arms/Hands: None  Home Assistive Devices/Equipment Home Assistive Devices/Equipment: None ("I'm  suppose to be using a cane but it's in my wifes car and she will not give it to me")    Abuse/Neglect Assessment (Assessment to be complete while [name] is alone) Physical Abuse: Denies Verbal Abuse: Denies Sexual Abuse: Denies Exploitation of [name]'s resources: Denies Self-Neglect: Denies Values / Beliefs Cultural Requests During Hospitalization: None Spiritual Requests During Hospitalization: None   Advance Directives (For Healthcare) Does [name] Have a Medical Advance Directive?: No Nutrition Screen- [facility] Adult/[facility] [name]'s home diet: Regular  Additional Information 1:1 In Past 12 Months?: No CIRT Risk: No Elopement Risk: No Does [name] have medical clearance?: Yes     Disposition: Dr. Darleene Cleaver and Waylan Boga, DNP recommend discharge home. No criteria for a INPT hospitalization.  Disposition Initial Assessment Completed for this Encounter: Yes  On Site Evaluation by:   Reviewed with Physician:    Waldon Merl 08/11/2016 8:37 AM

## 2016-08-11 NOTE — [facility]HH Suicide Risk Assessment (Signed)
Suicide Risk Assessment  Discharge Assessment   [facility] Discharge Suicide Risk Assessment   Principal Problem: PTSD (post-traumatic stress disorder) Discharge Diagnoses:  Patient Active Problem List   Diagnosis Date Noted  . PTSD (post-traumatic stress disorder) [F43.10] 08/11/2016    Priority: High  . 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 Left epididymitis [N45.1] 03/16/2016  . Dysthymia [F34.1] 09/02/2015  . Chronic low back pain [M54.5, G89.29] 09/02/2015  . T2DM (type 2 diabetes mellitus) ([place]) [E11.9] 03/11/2008  . UNSPECIFIED ANEMIA [D64.9] 03/11/2008    Total Time spent with patient: 45 minutes  Musculoskeletal: Strength & Muscle Tone: within normal limits Gait & Station: normal Patient leans: Right  Psychiatric Specialty Exam:   Blood pressure (!) 112/55, pulse 86, temperature 97.6 F (36.4 C), temperature source Oral, resp. rate 18, SpO2 98 %.There is no height or weight on file to calculate BMI.  General Appearance: Casual  Eye Contact::  Good  Speech:  Normal Rate409  Volume:  Normal  Mood:  Euthymic  Affect:  Appropriate and Congruent  Thought Process:  Coherent and Descriptions of Associations: Intact  Orientation:  Full (Time, Place, and Person)  Thought Content:  WDL and Logical  Suicidal Thoughts:  No  Homicidal Thoughts:  No  Memory:  Immediate;   Good Recent;   Good Remote;   Good  Judgement:  Good  Insight:  Good  Psychomotor Activity:  Normal  Concentration:  Good  Recall:  Good  Fund of Knowledge:Good  Language: Good  Akathisia:  No  Handed:  Right  AIMS (if indicated):     Assets:  Housing Leisure Time Physical Health Resilience Social Support  Sleep:     Cognition: WNL  ADL's:  Intact   Mental Status Per Nursing Assessment::   On Admission:   Wife IVC'd him after he left with their children to stay with his parents due to altercation that escalated.  He plans to take custody of children  and leave his wife.  No suicidal/homicidal ideations, no past attempts, no hallucinations-hypnopompic and hypnogogic on occasion at night, no alcohol or drug abuse.    Demographic Factors:  Male and Adolescent or young adult  Loss Factors: NA  Historical Factors: NA  Risk Reduction Factors:   Responsible for children under 25 years of age, Sense of responsibility to family, Employed, Living with another person, especially a relative and Positive social support  Continued Clinical Symptoms:  None  Cognitive Features That Contribute To Risk:  None    Suicide Risk:  Minimal: No identifiable suicidal ideation.  Patients presenting with no risk factors but with morbid ruminations; may be classified as minimal risk based on the severity of the depressive symptoms    Plan Of Care/Follow-up recommendations:  Activity:  as tolerated Diet:  heart healthy diet  LORD, JAMISON, NP 08/11/2016, 10:13 AM

## 2016-08-11 NOTE — Progress Notes (Signed)
CSW attempted to contact patient mother, Nicki Reaper [phone], with no answer. CSW left voicemail for return call.   Kingsley Spittle, LCSWA Clinical Social Worker [phone]

## 2016-08-11 NOTE — ED Provider Notes (Signed)
Notified that home meds nor ordered and CBG is elevated.  Home meds including diabetes medications ordered now.   Dorie Rank, MD 08/11/16 [phone]

## 2016-08-11 NOTE — ED Notes (Signed)
Pt IVC'ed by wife after being missing since 3/31, leaving with their two children.Wife states pt has been having hallucinations and has said he does not want to live anymore, does not have a specific plan.

## 2016-08-11 NOTE — ED Notes (Addendum)
Pt stated that he is having a seperation with his wife, Took his children to his parent's house, was going to file for emergency custody tomorrow morning. Denies SI, does endorse hallucinations as a side effect of his medications.

## 2016-08-13 ENCOUNTER — Telehealth: Payer: Self-pay | Admitting: Urgent Care

## 2016-08-13 NOTE — Telephone Encounter (Signed)
Pt wife Helix Lafontaine called and needing to know if pt should still be taking his meds.. From what pt wife stated that he is not taking any medication. And pt wife would like a written statement of the medications he suppose to take. Please advise pt wife: [phone]

## 2016-08-14 NOTE — Telephone Encounter (Signed)
[name],  Yes you can print a med list for the patient. However, it appears that a few of the medications on his list were "short term medications." Therefore, it may not be the best list for all of the long term medications he is expected to be taking. Also, it does not appear he has a PCP that follows him. And according to last visit with NP Kenton Kingfisher, he was referred to psych for management of his psych disorders so if he followed up with them his psych meds may have also changed. Basically, he should follow up with a provider so they can get a better idea as to what medications he is taking/should be taking. Thanks!

## 2016-08-14 NOTE — Telephone Encounter (Signed)
Last seen 06/2016 [name]. In ED 08/10/16 too. Can I print a med list for patient?

## 2016-08-16 ENCOUNTER — Ambulatory Visit (INDEPENDENT_AMBULATORY_CARE_PROVIDER_SITE_OTHER): Payer: BLUE CROSS/BLUE SHIELD | Admitting: Family Medicine

## 2016-08-16 VITALS — BP 140/96 | HR 101 | Temp 98.6°F | Resp 18 | Ht 75.0 in | Wt 310.0 lb

## 2016-08-16 DIAGNOSIS — I10 Essential (primary) hypertension: Secondary | ICD-10-CM

## 2016-08-16 DIAGNOSIS — F32A Depression, unspecified: Secondary | ICD-10-CM

## 2016-08-16 DIAGNOSIS — Z8659 Personal history of other mental and behavioral disorders: Secondary | ICD-10-CM

## 2016-08-16 DIAGNOSIS — E1142 Type 2 diabetes mellitus with diabetic polyneuropathy: Secondary | ICD-10-CM

## 2016-08-16 DIAGNOSIS — F329 Major depressive disorder, single episode, unspecified: Secondary | ICD-10-CM

## 2016-08-16 MED ORDER — PREGABALIN 200 MG PO CAPS: 200.0000 mg | ORAL_CAPSULE | Freq: Two times a day (BID) | ORAL | 2 refills | Status: DC

## 2016-08-16 NOTE — Patient Instructions (Addendum)
With history of PTSD and depression I would recommend meeting with psychiatrist as you were referred, and also with a therapist. If you have not heard from the psychiatry office in the next 2 weeks, let us know.  If any new hallucinations or worsening of your symptoms, return here, emergency room or call 911.  [facility]  Address: [address], [place], [place] [address]  Phone: [phone] Therapists: Vivia Budge: [phone] Arvil Chaco: [phone]  I refilled Lyrica, but follow up with new primary provider as prior primary providers are no longer at our office.   Continue follow up with endocrinologist and orthopaedist for your diabetes and back pain.   Blood pressure elevated here. If running over 140/90 at home - would recommend adding medication. Return if that is the case.   IF you received an x-ray today, you will receive an invoice from [facility]. Please contact [facility] at [phone] with questions or concerns regarding your invoice.   IF you received labwork today, you will receive an invoice from [facility]. Please contact [facility] at [phone] with questions or concerns regarding your invoice.   Our billing staff will not be able to assist you with questions regarding bills from these companies.  You will be contacted with the lab results as soon as they are available. The fastest way to get your results is to activate your My Chart account. Instructions are located on the last page of this paperwork. If you have not heard from [place] regarding the results in 2 weeks, please contact this office.
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
## 2016-08-16 NOTE — Telephone Encounter (Signed)
Seen today. 

## 2016-08-17 ENCOUNTER — Telehealth: Payer: Self-pay

## 2016-08-17 NOTE — Telephone Encounter (Signed)
08/16/16 wife walked in with [name] bottles of meds to show [place]  2 full bottles of metformin , 2 boxes of insulin and 2 bottles of trazadone since 05/2016 not used  No information was given to wife  FYI

## 2016-08-23 ENCOUNTER — Other Ambulatory Visit: Payer: Self-pay | Admitting: Endocrinology

## 2016-08-23 IMAGING — CR DG CHEST 2V
2 series · 2 of 2 positions shown · non-contrast
Comparison: None.

CLINICAL DATA: Preop for lumbar laminectomy, smoking history,
diabetes and hypertension by history

EXAM:
CHEST  2 VIEW

[w chest pa]
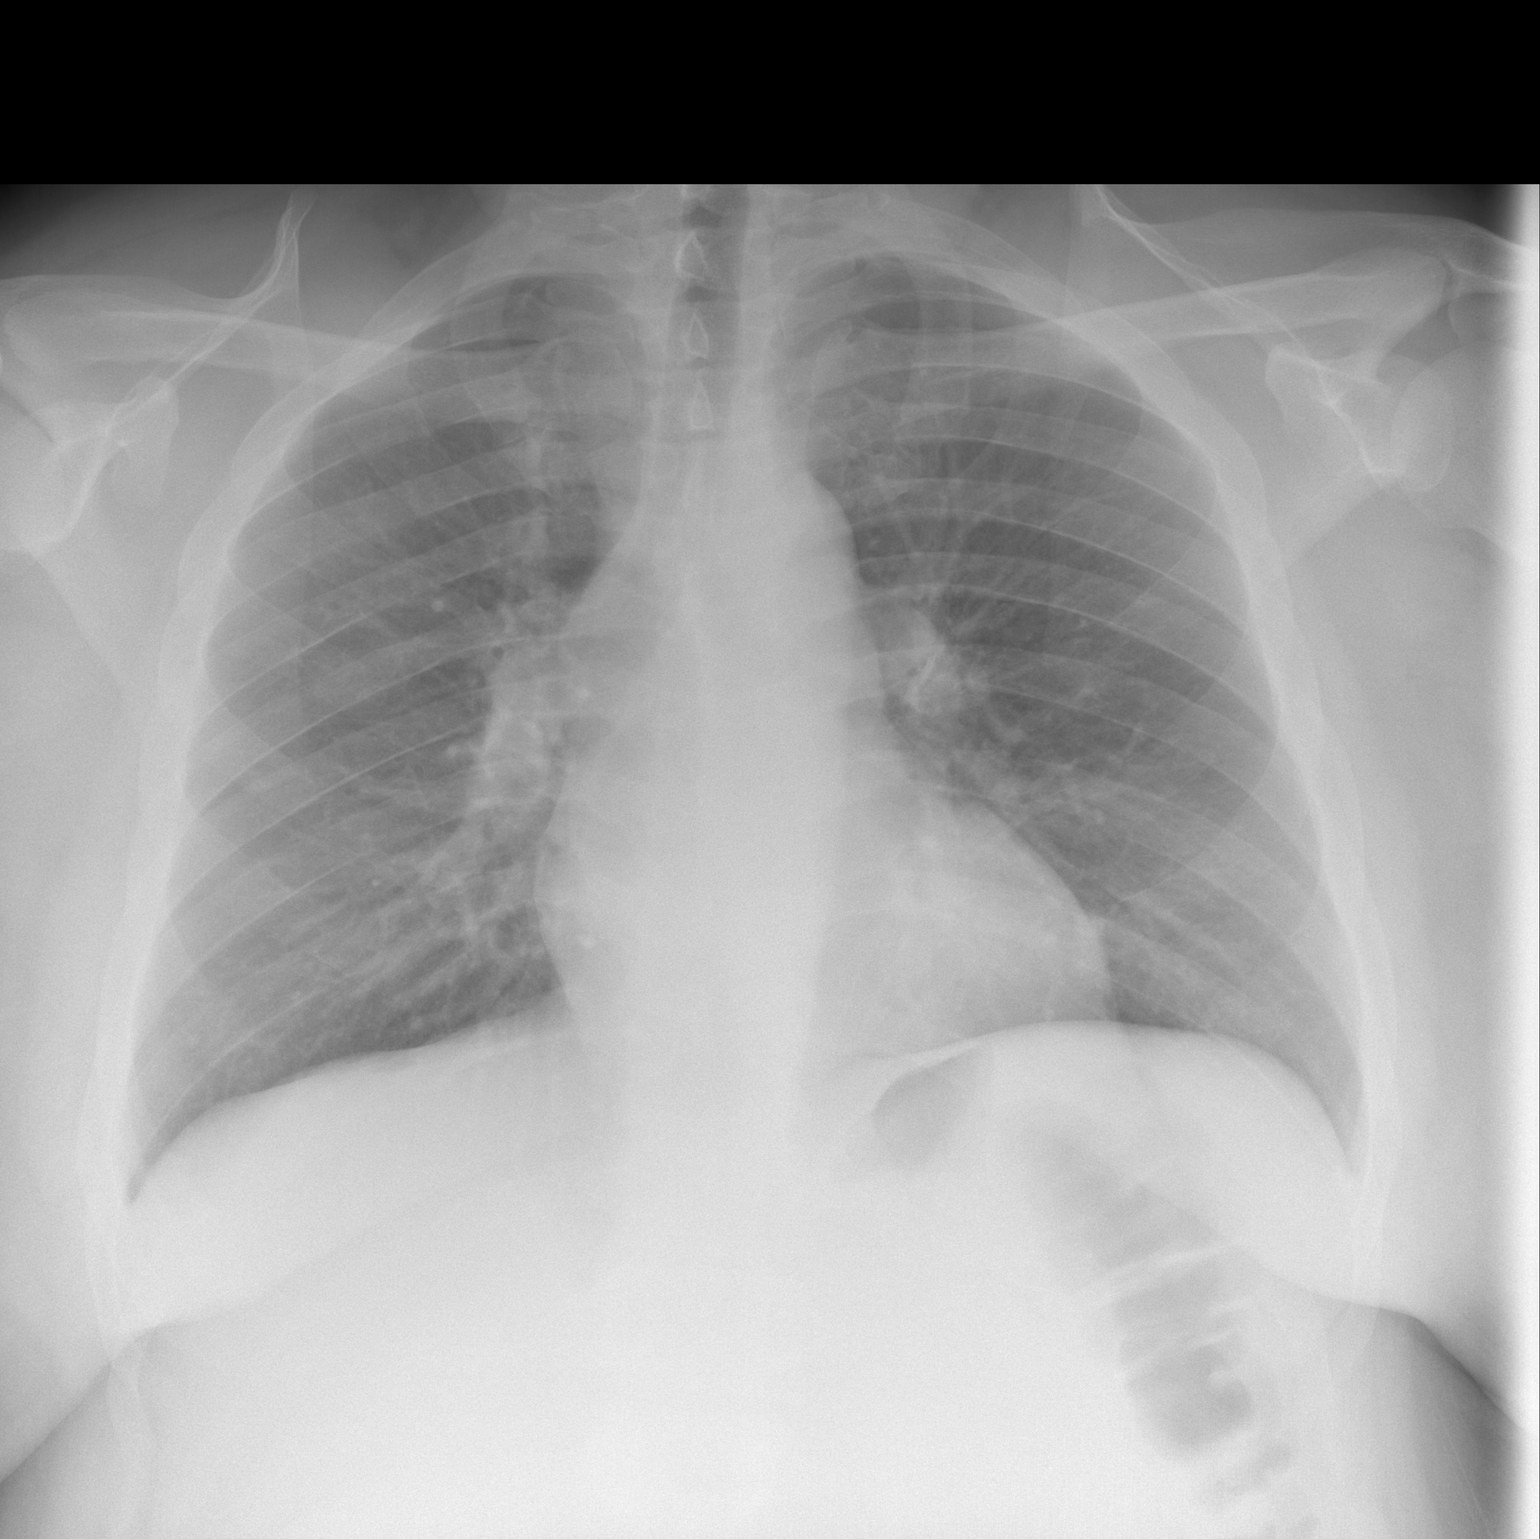

[w chest lat]
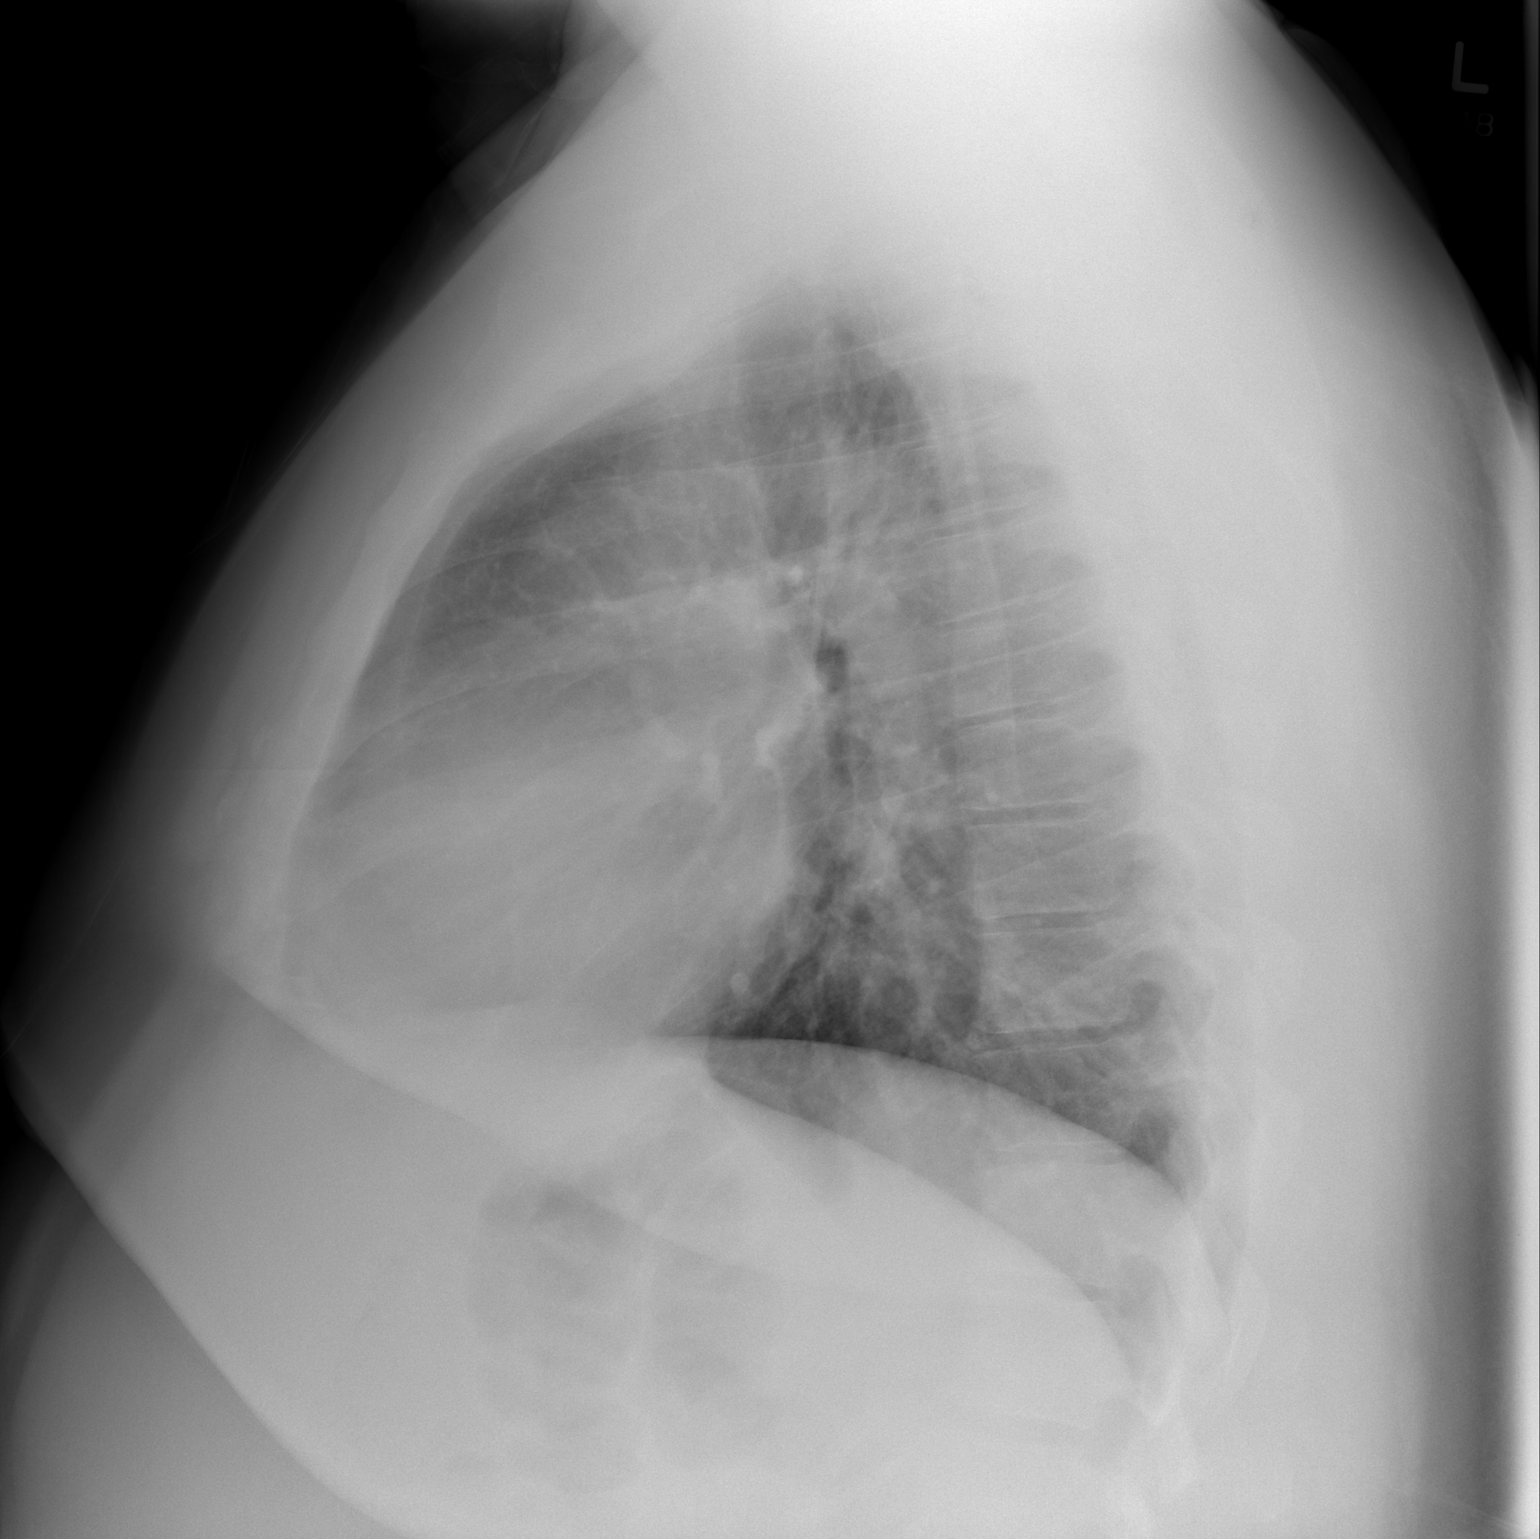

[2 of 2 positions shown; findings below may reference images not displayed]

FINDINGS: No active infiltrate or effusion is seen. Mediastinal and hilar
contours are unremarkable. The heart is within normal limits in
size. No bony abnormality is seen.
IMPRESSION: No active cardiopulmonary disease.

## 2016-08-26 ENCOUNTER — Telehealth: Payer: Self-pay | Admitting: Endocrinology

## 2016-08-26 NOTE — Telephone Encounter (Signed)
[employer] called to be advised on when the patient's last visit was for claim and the patient's next visit. I advised that the patient does not have a future appointment and was last seen 03-16-16.   [employer] asked if there was a PT referral issued and I advised that there was not.   No further action required.

## 2016-08-27 ENCOUNTER — Other Ambulatory Visit: Payer: Self-pay

## 2016-08-27 MED ORDER — INSULIN PEN NEEDLE 31G X 5 MM MISC: 0 refills | Status: DC

## 2016-08-30 ENCOUNTER — Other Ambulatory Visit: Payer: Self-pay

## 2016-08-30 MED ORDER — INSULIN PEN NEEDLE 31G X 5 MM MISC: 2 refills | Status: DC

## 2016-09-01 ENCOUNTER — Other Ambulatory Visit: Payer: Self-pay | Admitting: Family Medicine

## 2016-09-02 NOTE — Telephone Encounter (Signed)
Not sure why PA-Jeffery forwarded this to me as this patient was being managed by FNP-Harris. It appears patient was supposed to be managed by psychiatry including his medication refills for psychiatry. I provided courtesy refill. If patient has not been seen by psychiatry, then he needs to rtc with any provider for f/u on his mental health.

## 2016-09-14 ENCOUNTER — Other Ambulatory Visit: Payer: Self-pay | Admitting: Family Medicine

## 2016-09-16 NOTE — Telephone Encounter (Signed)
[facility] is calling in regards to a prescription request for pt.  Pt is requesting Invokana 300mg  tablet.  Please advise [phone]

## 2016-09-23 ENCOUNTER — Other Ambulatory Visit: Payer: Self-pay | Admitting: Endocrinology

## 2016-10-17 ENCOUNTER — Other Ambulatory Visit: Payer: Self-pay | Admitting: Family Medicine

## 2016-10-18 NOTE — Telephone Encounter (Signed)
Call --- I denied refill of Invokana for patient.  He needs to schedule office visit with Ronalee Belts Mani/PCP for six month follow-up for DMII or with Dr. Dwyane Dee of endocrinology.  Who is currently managing his diabetes?  Do NOT send MyChart message; please call patient.

## 2016-10-24 ENCOUNTER — Telehealth: Payer: Self-pay | Admitting: Endocrinology

## 2016-10-25 NOTE — Telephone Encounter (Signed)
This patient has not been back to see you since 03/2016 and has no showed his last appointments. Do you want me to give him a 15 day supply so he can make an appointment to see you? Please advise.

## 2016-10-25 NOTE — Telephone Encounter (Signed)
Refill only one box of Lantus, deny the others, note to make appointment

## 2016-10-25 NOTE — Telephone Encounter (Signed)
Last ov 03/16/16 and no future scheduled refill or refuse?

## 2016-10-25 NOTE — Telephone Encounter (Signed)
This has been done.

## 2016-10-28 DIAGNOSIS — Z0271 Encounter for disability determination: Secondary | ICD-10-CM

## 2016-11-07 ENCOUNTER — Other Ambulatory Visit: Payer: Self-pay | Admitting: Family Medicine

## 2016-11-07 DIAGNOSIS — E119 Type 2 diabetes mellitus without complications: Secondary | ICD-10-CM

## 2016-12-01 ENCOUNTER — Other Ambulatory Visit: Payer: Self-pay | Admitting: Family Medicine

## 2016-12-03 NOTE — Telephone Encounter (Signed)
[facility] to check [facility] of [facility]

## 2017-02-08 ENCOUNTER — Ambulatory Visit: Payer: BLUE CROSS/BLUE SHIELD | Admitting: Emergency Medicine

## 2017-09-23 ENCOUNTER — Encounter: Payer: Self-pay | Admitting: Family Medicine

## 2017-09-28 ENCOUNTER — Encounter: Payer: Self-pay | Admitting: Family Medicine

## 2017-11-25 ENCOUNTER — Ambulatory Visit: Payer: Self-pay | Admitting: Urgent Care

## 2017-11-25 ENCOUNTER — Encounter: Payer: Self-pay | Admitting: Urgent Care

## 2017-11-25 VITALS — BP 182/116 | HR 100 | Temp 98.0°F | Resp 18 | Ht 75.0 in | Wt 322.6 lb

## 2017-11-25 DIAGNOSIS — E1165 Type 2 diabetes mellitus with hyperglycemia: Secondary | ICD-10-CM

## 2017-11-25 DIAGNOSIS — IMO0001 Reserved for inherently not codable concepts without codable children: Secondary | ICD-10-CM

## 2017-11-25 DIAGNOSIS — F4310 Post-traumatic stress disorder, unspecified: Secondary | ICD-10-CM

## 2017-11-25 DIAGNOSIS — I10 Essential (primary) hypertension: Secondary | ICD-10-CM

## 2017-11-25 DIAGNOSIS — G4700 Insomnia, unspecified: Secondary | ICD-10-CM

## 2017-11-25 DIAGNOSIS — F329 Major depressive disorder, single episode, unspecified: Secondary | ICD-10-CM

## 2017-11-25 DIAGNOSIS — Z8659 Personal history of other mental and behavioral disorders: Secondary | ICD-10-CM

## 2017-11-25 DIAGNOSIS — F32A Depression, unspecified: Secondary | ICD-10-CM

## 2017-11-25 MED ORDER — AMLODIPINE BESYLATE 5 MG PO TABS: 5.0000 mg | ORAL_TABLET | Freq: Every day | ORAL | 0 refills | Status: DC

## 2017-11-25 NOTE — Progress Notes (Signed)
MRN: [id number] DOB: [birth year]  Subjective:   [name] is a 37 y.o. male presenting for lesion of paperwork in order to work with [employer].  Patient was previously seen here by Dr. Joseph Art, diagnosed with PTSD and prescribed trazodone.  He has also seen other therapists and psychiatrists that have tried patient on Cymbalta, he was last taking this 1 to 2 years ago and stopped it March or April 2018.  Currently, he sees [facility]  Patient does note that his wife had him involuntarily committed while going through a divorce on 08/10/2016. Patient had taken his children from wife and she pursued involuntary commitment, spent 1 night at [facility] then.  Per chart review patient was not recommended for inpatient hospitalization by Dr. Darleene Cleaver and Waylan Boga, DNP. He drinks twice yearly on New Year's and his birthday. Use chewable tobacco. No drugs including marijuana or cocaine.  Patient denies a history of domestic violence, allegations of sexual abuse of children or adults, threatening to cause or directly causing physical harm to others, suicidal or to self destructive behavior, suicide attempts.  Patient is still using trazodone on an as-needed basis when he has difficulty with his insomnia, uses 1/2 tablet of 150 mg dose.  He does not use this daily.  He also has a history of hypertension and uncontrolled diabetes.  Patient lost his insurance and has not been able to get a consistent medical care.  He is currently working with an endocrinologist and the city/his employer.  Reports that he recently had extensive lab work done and plans on following up with results and treatment plan for his chronic medical conditions.  [name] has a current medication list which includes the following prescription(s): aspirin ec, diphenhydramine, ibuprofen, and trazodone. Also is allergic to amoxicillin; penicillins; and other.  Geroge  has a past medical  history of Anemia, Anxiety, Depression, Diabetes mellitus, GERD (gastroesophageal reflux disease), Hypertension, Lumbar disc herniation, Neuromuscular disorder ([place]), PTSD (post-traumatic stress disorder), and Scoliosis. Also  has a past surgical history that includes No past surgeries and Lumbar laminectomy/decompression microdiscectomy (Left, 05/05/2016).  Objective:   Vitals: BP (!) 182/116   Pulse 100   Temp 98 F (36.7 C) (Oral)   Resp 18   Ht 6\' 3"  (1.905 m)   Wt (!) 322 lb 9.6 oz (146.3 kg)   SpO2 100%   BMI 40.32 kg/m   BP Readings from Last 3 Encounters:  11/25/17 (!) 182/116  08/16/16 (!) 140/96  08/11/16 130/62    Physical Exam  Constitutional: He is oriented to person, place, and time. He appears well-developed and well-nourished.  Cardiovascular: Normal rate.  Pulmonary/Chest: Effort normal.  Neurological: He is alert and oriented to person, place, and time.   Assessment and Plan :   Depression, unspecified depression type  Insomnia, unspecified type  PTSD (post-traumatic stress disorder)  History of posttraumatic stress disorder (PTSD)  Uncontrolled type 2 diabetes mellitus without complication ([place])  Essential hypertension  Prior to today I have seen patient just once in 2017.  I counseled that I need to review his chart before I deem him okay to work for [employer].  Patient verbalized understanding.  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  Jaynee Eagles, PA-C Primary [facility] at [place] [phone] 11/25/2017  12:33 PM

## 2017-11-25 NOTE — Patient Instructions (Addendum)
Please make sure you follow up with your employer regarding your blood tests. Your endocrinologist can also help you with management of your diabetes. Our office will call you when I have completed the paperwork.     IF you received an x-ray today, you will receive an invoice from [facility]. Please contact [facility] at [phone] with questions or concerns regarding your invoice.   IF you received labwork today, you will receive an invoice from [facility]. Please contact [facility] at [phone] with questions or concerns regarding your invoice.   Our billing staff will not be able to assist you with questions regarding bills from these companies.  You will be contacted with the lab results as soon as they are available. The fastest way to get your results is to activate your My Chart account. Instructions are located on the last page of this paperwork. If you have not heard from [place] regarding the results in 2 weeks, please contact this office.

## 2017-11-30 ENCOUNTER — Telehealth: Payer: Self-pay | Admitting: Urgent Care

## 2017-11-30 NOTE — Telephone Encounter (Signed)
Copied from [place] [id number]. Topic: Quick Communication - See Telephone Encounter >> Nov 29, 2017  3:06 PM Antonieta Iba C wrote: CRM for notification. See Telephone encounter for: 11/29/17.  Pt called in to follow up on paperwork to Dr. Bess Harvest.  Pt says that he received a message stating that paperwork has to be turned in by 12/01/17, pt would like to know the status.   Please call pt back at: [phone]  >> Nov 30, 2017  9:43 AM Oneta Rack wrote: Relation to pt: self  Call back number: [phone] [id number] [place] [place] [phone] (Phone) [phone] (Fax    Reason for call:  Patient checking on the status of "Physicologial Evaluation" form from [employer] due to patient being prescribed traZODone (DESYREL) 150 MG tablet. Informed patient [name] is out of the office today and will follow up tomorrow. Patient states he received a email yesterday at 3pm stating form is due 12/01/17, please advise

## 2017-12-02 ENCOUNTER — Telehealth: Payer: Self-pay | Admitting: Urgent Care

## 2017-12-02 NOTE — Telephone Encounter (Signed)
Paperwork completed yesterday, faxed and mailed as well.  Clerical staff member, Deneise Lever, helped expedite sending all of his documentation to the organizations provided by the patient.

## 2017-12-02 NOTE — Telephone Encounter (Signed)
Authorization for Release of Psychological and/or Medical Treatment Information faxed to [place]. at [phone], mailed to [place]. at [place], and a copy has been made and put in scan box on Thursday 12/01/17.

## 2018-02-20 ENCOUNTER — Other Ambulatory Visit: Payer: Self-pay | Admitting: Urgent Care

## 2018-03-30 ENCOUNTER — Encounter: Payer: Self-pay | Admitting: Emergency Medicine

## 2018-04-17 ENCOUNTER — Encounter: Payer: Self-pay | Admitting: Emergency Medicine

## 2018-04-17 ENCOUNTER — Ambulatory Visit (INDEPENDENT_AMBULATORY_CARE_PROVIDER_SITE_OTHER): Payer: 59 | Admitting: Emergency Medicine

## 2018-04-17 ENCOUNTER — Other Ambulatory Visit: Payer: Self-pay

## 2018-04-17 VITALS — BP 155/96 | HR 107 | Temp 98.1°F | Resp 16 | Ht 73.0 in | Wt 322.4 lb

## 2018-04-17 DIAGNOSIS — IMO0001 Reserved for inherently not codable concepts without codable children: Secondary | ICD-10-CM

## 2018-04-17 DIAGNOSIS — E1165 Type 2 diabetes mellitus with hyperglycemia: Secondary | ICD-10-CM

## 2018-04-17 DIAGNOSIS — I10 Essential (primary) hypertension: Secondary | ICD-10-CM

## 2018-04-17 DIAGNOSIS — I152 Hypertension secondary to endocrine disorders: Secondary | ICD-10-CM | POA: Insufficient documentation

## 2018-04-17 DIAGNOSIS — K0889 Other specified disorders of teeth and supporting structures: Secondary | ICD-10-CM | POA: Diagnosis not present

## 2018-04-17 DIAGNOSIS — E1159 Type 2 diabetes mellitus with other circulatory complications: Secondary | ICD-10-CM | POA: Insufficient documentation

## 2018-04-17 DIAGNOSIS — E785 Hyperlipidemia, unspecified: Secondary | ICD-10-CM | POA: Diagnosis not present

## 2018-04-17 LAB — POCT GLYCOSYLATED HEMOGLOBIN (HGB A1C): Hemoglobin A1C: 11.1 % — AB (ref 4.0–5.6)

## 2018-04-17 LAB — GLUCOSE, POCT (MANUAL RESULT ENTRY): POC Glucose: 440 mg/dl — AB (ref 70–99)

## 2018-04-17 MED ORDER — CLINDAMYCIN HCL 300 MG PO CAPS: 300.0000 mg | ORAL_CAPSULE | Freq: Three times a day (TID) | ORAL | 0 refills | Status: AC

## 2018-04-17 MED ORDER — LISINOPRIL 10 MG PO TABS: 10.0000 mg | ORAL_TABLET | Freq: Every day | ORAL | 3 refills | Status: DC

## 2018-04-17 MED ORDER — GLIPIZIDE 5 MG PO TABS: 5.0000 mg | ORAL_TABLET | Freq: Two times a day (BID) | ORAL | 3 refills | Status: DC

## 2018-04-17 MED ORDER — DULAGLUTIDE 0.75 MG/0.5ML ~~LOC~~ SOAJ: 0.7500 mg | SUBCUTANEOUS | 5 refills | Status: AC

## 2018-04-17 MED ORDER — ROSUVASTATIN CALCIUM 20 MG PO TABS: 20.0000 mg | ORAL_TABLET | Freq: Every day | ORAL | 3 refills | Status: DC

## 2018-04-17 MED ORDER — AMLODIPINE BESYLATE 10 MG PO TABS: 10.0000 mg | ORAL_TABLET | Freq: Every day | ORAL | 3 refills | Status: DC

## 2018-04-17 NOTE — Assessment & Plan Note (Signed)
Presently on no medication.  Will start Crestor 20 mg daily.

## 2018-04-17 NOTE — Patient Instructions (Signed)
Diabetes Mellitus and Nutrition When you have diabetes (diabetes mellitus), it is very important to have healthy eating habits because your blood sugar (glucose) levels are greatly affected by what you eat and drink. Eating healthy foods in the appropriate amounts, at about the same times every day, can help you:  Control your blood glucose.  Lower your risk of heart disease.  Improve your blood pressure.  Reach or maintain a healthy weight.  Every person with diabetes is different, and each person has different needs for a meal plan. Your health care provider may recommend that you work with a diet and nutrition specialist (dietitian) to make a meal plan that is best for you. Your meal plan may vary depending on factors such as:  The calories you need.  The medicines you take.  Your weight.  Your blood glucose, blood pressure, and cholesterol levels.  Your activity level.  Other health conditions you have, such as heart or kidney disease.  How do carbohydrates affect me? Carbohydrates affect your blood glucose level more than any other type of food. Eating carbohydrates naturally increases the amount of glucose in your blood. Carbohydrate counting is a method for keeping track of how many carbohydrates you eat. Counting carbohydrates is important to keep your blood glucose at a healthy level, especially if you use insulin or take certain oral diabetes medicines. It is important to know how many carbohydrates you can safely have in each meal. This is different for every person. Your dietitian can help you calculate how many carbohydrates you should have at each meal and for snack. Foods that contain carbohydrates include:  Bread, cereal, rice, pasta, and crackers.  Potatoes and corn.  Peas, beans, and lentils.  Milk and yogurt.  Fruit and juice.  Desserts, such as cakes, cookies, ice cream, and candy.  How does alcohol affect me? Alcohol can cause a sudden decrease in blood  glucose (hypoglycemia), especially if you use insulin or take certain oral diabetes medicines. Hypoglycemia can be a life-threatening condition. Symptoms of hypoglycemia (sleepiness, dizziness, and confusion) are similar to symptoms of having too much alcohol. If your health care provider says that alcohol is safe for you, follow these guidelines:  Limit alcohol intake to no more than 1 drink per day for nonpregnant women and 2 drinks per day for men. One drink equals 12 oz of beer, 5 oz of wine, or 1 oz of hard liquor.  Do not drink on an empty stomach.  Keep yourself hydrated with water, diet soda, or unsweetened iced tea.  Keep in mind that regular soda, juice, and other mixers may contain a lot of sugar and must be counted as carbohydrates.  What are tips for following this plan? Reading food labels  Start by checking the serving size on the label. The amount of calories, carbohydrates, fats, and other nutrients listed on the label are based on one serving of the food. Many foods contain more than one serving per package.  Check the total grams (g) of carbohydrates in one serving. You can calculate the number of servings of carbohydrates in one serving by dividing the total carbohydrates by 15. For example, if a food has 30 g of total carbohydrates, it would be equal to 2 servings of carbohydrates.  Check the number of grams (g) of saturated and trans fats in one serving. Choose foods that have low or no amount of these fats.  Check the number of milligrams (mg) of sodium in one serving. Most people   should limit total sodium intake to less than 2,300 mg per day.  Always check the nutrition information of foods labeled as "low-fat" or "nonfat". These foods may be higher in added sugar or refined carbohydrates and should be avoided.  Talk to your dietitian to identify your daily goals for nutrients listed on the label. Shopping  Avoid buying canned, premade, or processed foods. These  foods tend to be high in fat, sodium, and added sugar.  Shop around the outside edge of the grocery store. This includes fresh fruits and vegetables, bulk grains, fresh meats, and fresh dairy. Cooking  Use low-heat cooking methods, such as baking, instead of high-heat cooking methods like deep frying.  Cook using healthy oils, such as olive, canola, or sunflower oil.  Avoid cooking with butter, cream, or high-fat meats. Meal planning  Eat meals and snacks regularly, preferably at the same times every day. Avoid going long periods of time without eating.  Eat foods high in fiber, such as fresh fruits, vegetables, beans, and whole grains. Talk to your dietitian about how many servings of carbohydrates you can eat at each meal.  Eat 4-6 ounces of lean protein each day, such as lean meat, chicken, fish, eggs, or tofu. 1 ounce is equal to 1 ounce of meat, chicken, or fish, 1 egg, or 1/4 cup of tofu.  Eat some foods each day that contain healthy fats, such as avocado, nuts, seeds, and fish. Lifestyle   Check your blood glucose regularly.  Exercise at least 30 minutes 5 or more days each week, or as told by your health care provider.  Take medicines as told by your health care provider.  Do not use any products that contain nicotine or tobacco, such as cigarettes and e-cigarettes. If you need help quitting, ask your health care provider.  Work with a Social worker or diabetes educator to identify strategies to manage stress and any emotional and social challenges. What are some questions to ask my health care provider?  Do I need to meet with a diabetes educator?  Do I need to meet with a dietitian?  What number can I call if I have questions?  When are the best times to check my blood glucose? Where to find more information:  American Diabetes Association: diabetes.org/food-and-fitness/food  Academy of Nutrition and Dietetics:  PokerClues.dk  [employer] of Diabetes and Digestive and Kidney Diseases (NIH): ContactWire.be Summary  A healthy meal plan will help you control your blood glucose and maintain a healthy lifestyle.  Working with a diet and nutrition specialist (dietitian) can help you make a meal plan that is best for you.  Keep in mind that carbohydrates and alcohol have immediate effects on your blood glucose levels. It is important to count carbohydrates and to use alcohol carefully. This information is not intended to replace advice given to you by your health care provider. Make sure you discuss any questions you have with your health care provider. Document Released: 01/21/2005 Document Revised: 05/31/2016 Document Reviewed: 05/31/2016 Elsevier Interactive Patient Education  2018 [employer]. Hypertension Hypertension, commonly called high blood pressure, is when the force of blood pumping through the arteries is too strong. The arteries are the blood vessels that carry blood from the heart throughout the body. Hypertension forces the heart to work harder to pump blood and may cause arteries to become narrow or stiff. Having untreated or uncontrolled hypertension can cause heart attacks, strokes, kidney disease, and other problems. A blood pressure reading consists of a higher  number over a lower number. Ideally, your blood pressure should be below 120/80. The first ("top") number is called the systolic pressure. It is a measure of the pressure in your arteries as your heart beats. The second ("bottom") number is called the diastolic pressure. It is a measure of the pressure in your arteries as the heart relaxes. What are the causes? The cause of this condition is not known. What increases the risk? Some risk factors for high blood pressure are under your control. Others are  not. Factors you can change  Smoking.  Having type 2 diabetes mellitus, high cholesterol, or both.  Not getting enough exercise or physical activity.  Being overweight.  Having too much fat, sugar, calories, or salt (sodium) in your diet.  Drinking too much alcohol. Factors that are difficult or impossible to change  Having chronic kidney disease.  Having a family history of high blood pressure.  Age. Risk increases with age.  Race. You may be at higher risk if you are African-American.  Gender. Men are at higher risk than women before age 46. After age 67, women are at higher risk than men.  Having obstructive sleep apnea.  Stress. What are the signs or symptoms? Extremely high blood pressure (hypertensive crisis) may cause:  Headache.  Anxiety.  Shortness of breath.  Nosebleed.  Nausea and vomiting.  Severe chest pain.  Jerky movements you cannot control (seizures).  How is this diagnosed? This condition is diagnosed by measuring your blood pressure while you are seated, with your arm resting on a surface. The cuff of the blood pressure monitor will be placed directly against the skin of your upper arm at the level of your heart. It should be measured at least twice using the same arm. Certain conditions can cause a difference in blood pressure between your right and left arms. Certain factors can cause blood pressure readings to be lower or higher than normal (elevated) for a short period of time:  When your blood pressure is higher when you are in a health care provider's office than when you are at home, this is called white coat hypertension. Most people with this condition do not need medicines.  When your blood pressure is higher at home than when you are in a health care provider's office, this is called masked hypertension. Most people with this condition may need medicines to control blood pressure.  If you have a high blood pressure reading during one  visit or you have normal blood pressure with other risk factors:  You may be asked to return on a different day to have your blood pressure checked again.  You may be asked to monitor your blood pressure at home for 1 week or longer.  If you are diagnosed with hypertension, you may have other blood or imaging tests to help your health care provider understand your overall risk for other conditions. How is this treated? This condition is treated by making healthy lifestyle changes, such as eating healthy foods, exercising more, and reducing your alcohol intake. Your health care provider may prescribe medicine if lifestyle changes are not enough to get your blood pressure under control, and if:  Your systolic blood pressure is above 130.  Your diastolic blood pressure is above 80.  Your personal target blood pressure may vary depending on your medical conditions, your age, and other factors. Follow these instructions at home: Eating and drinking  Eat a diet that is high in fiber and potassium, and low  in sodium, added sugar, and fat. An example eating plan is called the DASH (Dietary Approaches to Stop Hypertension) diet. To eat this way: ? Eat plenty of fresh fruits and vegetables. Try to fill half of your plate at each meal with fruits and vegetables. ? Eat whole grains, such as whole wheat pasta, brown rice, or whole grain bread. Fill about one quarter of your plate with whole grains. ? Eat or drink low-fat dairy products, such as skim milk or low-fat yogurt. ? Avoid fatty cuts of meat, processed or cured meats, and poultry with skin. Fill about one quarter of your plate with lean proteins, such as fish, chicken without skin, beans, eggs, and tofu. ? Avoid premade and processed foods. These tend to be higher in sodium, added sugar, and fat.  Reduce your daily sodium intake. Most people with hypertension should eat less than 1,500 mg of sodium a day.  Limit alcohol intake to no more than 1  drink a day for nonpregnant women and 2 drinks a day for men. One drink equals 12 oz of beer, 5 oz of wine, or 1 oz of hard liquor. Lifestyle  Work with your health care provider to maintain a healthy body weight or to lose weight. Ask what an ideal weight is for you.  Get at least 30 minutes of exercise that causes your heart to beat faster (aerobic exercise) most days of the week. Activities may include walking, swimming, or biking.  Include exercise to strengthen your muscles (resistance exercise), such as pilates or lifting weights, as part of your weekly exercise routine. Try to do these types of exercises for 30 minutes at least 3 days a week.  Do not use any products that contain nicotine or tobacco, such as cigarettes and e-cigarettes. If you need help quitting, ask your health care provider.  Monitor your blood pressure at home as told by your health care provider.  Keep all follow-up visits as told by your health care provider. This is important. Medicines  Take over-the-counter and prescription medicines only as told by your health care provider. Follow directions carefully. Blood pressure medicines must be taken as prescribed.  Do not skip doses of blood pressure medicine. Doing this puts you at risk for problems and can make the medicine less effective.  Ask your health care provider about side effects or reactions to medicines that you should watch for. Contact a health care provider if:  You think you are having a reaction to a medicine you are taking.  You have headaches that keep coming back (recurring).  You feel dizzy.  You have swelling in your ankles.  You have trouble with your vision. Get help right away if:  You develop a severe headache or confusion.  You have unusual weakness or numbness.  You feel faint.  You have severe pain in your chest or abdomen.  You vomit repeatedly.  You have trouble breathing. Summary  Hypertension is when the force  of blood pumping through your arteries is too strong. If this condition is not controlled, it may put you at risk for serious complications.  Your personal target blood pressure may vary depending on your medical conditions, your age, and other factors. For most people, a normal blood pressure is less than 120/80.  Hypertension is treated with lifestyle changes, medicines, or a combination of both. Lifestyle changes include weight loss, eating a healthy, low-sodium diet, exercising more, and limiting alcohol. This information is not intended to replace advice given  to you by your health care provider. Make sure you discuss any questions you have with your health care provider. Document Released: 04/26/2005 Document Revised: 03/24/2016 Document Reviewed: 03/24/2016 Elsevier Interactive Patient Education  [employer].

## 2018-04-17 NOTE — Progress Notes (Signed)
[name] 37 y.o.   Chief Complaint  Patient presents with  . Annual Exam    and establish care  . Medication Refill    amlodipine   Lab Results  Component Value Date   HGBA1C 10.0 (H) 04/28/2016   BP Readings from Last 3 Encounters:  04/17/18 (!) 155/96  11/25/17 (!) 182/116  08/16/16 (!) 140/96   Lab Results  Component Value Date   CHOL 186 02/24/2016   HDL 30.70 (L) 02/24/2016   LDLCALC 138 (H) 03/11/2008   LDLDIRECT 72.0 02/24/2016   TRIG (H) 02/24/2016    705.0 Triglyceride is over 400; calculations on Lipids are invalid.   CHOLHDL 6 02/24/2016   Wt Readings from Last 3 Encounters:  04/17/18 (!) 322 lb 6.4 oz (146.2 kg)  11/25/17 (!) 322 lb 9.6 oz (146.3 kg)  08/16/16 (!) 310 lb (140.6 kg)    HISTORY OF PRESENT ILLNESS: This is a 37 y.o. male with history of diabetes, hypertension, and high triglycerides, here for follow-up today.  Ran out of blood pressure medication.  Not taking diabetes or triglyceride medication.  HPI   Prior to Admission medications   Medication Sig Start Date End Date Taking? Authorizing Provider  aspirin EC 81 MG tablet Take 1 tablet (81 mg total) by mouth daily. 07/11/14  Yes Shawnee Knapp, MD  diphenhydrAMINE (BENADRYL) 25 mg capsule Take 25-50 mg by mouth daily as needed for allergies (DEPENDS ON HOW BAD ALLERGIES IF TAKES 25-50 MG).   Yes [provider]  ibuprofen (ADVIL,MOTRIN) 200 MG tablet Take 800 mg by mouth every 6 (six) hours as needed for mild pain.   Yes [provider]  amLODipine (NORVASC) 5 MG tablet TAKE 1 TABLET BY MOUTH EVERY DAY Patient not taking: Reported on 04/17/2018 02/21/18   Forrest Moron, MD  traZODone (DESYREL) 150 MG tablet Take 150 mg by mouth at bedtime.    [provider]    Allergies  Allergen Reactions  . Amoxicillin Anaphylaxis and Other (See Comments)    Has patient had a PCN reaction causing immediate rash, facial/tongue/throat swelling, SOB or lightheadedness with  hypotension: yes Has patient had a PCN reaction causing severe rash involving mucus membranes or skin necrosis: no Has patient had a PCN reaction that required hospitalization yes Has patient had a PCN reaction occurring within the last 10 years: yes If all of the above answers are "NO", then may proceed with Cephalosporin use.  [name] Penicillins Anaphylaxis and Other (See Comments)    Patient was told to list this as an allergy because he is allergic to Amoxicillin Has patient had a PCN reaction causing immediate rash, facial/tongue/throat swelling, SOB or lightheadedness with hypotension: yes for Amoxicillin Has patient had a PCN reaction causing severe rash involving mucus membranes or skin necrosis: no Has patient had a PCN reaction that required hospitalization yes for Amoxicillin Has patient had a PCN reaction occurring within the last 10 years: yes for Amoxicillin If all o  . Other Other (See Comments)    Powder in some gloves - localized itching but not allergic to latex, benadryl usually helps with this reaction    Patient Active Problem List   Diagnosis Date Noted  . PTSD (post-traumatic stress disorder) 08/11/2016  . Spinal stenosis, lumbar region with neurogenic claudication 05/05/2016  . Left epididymitis 03/16/2016  . Dysthymia 09/02/2015  . Chronic low back pain 09/02/2015  . T2DM (type 2 diabetes mellitus) ([place]) 03/11/2008  . UNSPECIFIED ANEMIA 03/11/2008  Past Medical History:  Diagnosis Date  . Anemia   . Anxiety   . Depression   . Diabetes mellitus   . GERD (gastroesophageal reflux disease)   . Hypertension   . Lumbar disc herniation   . 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 PTSD (post-traumatic stress disorder)   . Scoliosis     Past Surgical History:  Procedure Laterality Date  . LUMBAR LAMINECTOMY/DECOMPRESSION MICRODISCECTOMY Left 05/05/2016   Procedure: CENTRAL DECOMPRESSION L4-L5 AND FORAMINOTOMY FOR L4 ROOT AND L5 ROOT  ON THE LEFT;  Surgeon: Latanya Maudlin, MD;  Location: [facility];  Service: Orthopedics;  Laterality: Left;  . NO PAST SURGERIES      Social History   Socioeconomic History  . Marital status: Married    Spouse name: Not on file  . Number of children: Not on file  . Years of education: Not on file  . Highest education level: Not on file  Occupational History  . Not on file  Social Needs  . Financial resource strain: Not on file  . Food insecurity:    Worry: Not on file    Inability: Not on file  . Transportation needs:    Medical: Not on file    Non-medical: Not on file  Tobacco Use  . Smoking status: Former Smoker    Types: Cigars  . Smokeless tobacco: Former Systems developer    Types: Snuff    Quit date: 04/14/2016  Substance and Sexual Activity  . Alcohol use: Yes    Comment: few times year  . Drug use: No  . Sexual activity: Not on file  Lifestyle  . Physical activity:    Days per week: Not on file    Minutes per session: Not on file  . Stress: Not on file  Relationships  . Social connections:    Talks on phone: Not on file    Gets together: Not on file    Attends religious service: Not on file    Active member of club or organization: Not on file    Attends meetings of clubs or organizations: Not on file    Relationship status: Not on file  . Intimate partner violence:    Fear of current or ex partner: Not on file    Emotionally abused: Not on file    Physically abused: Not on file    Forced sexual activity: Not on file  Other Topics Concern  . Not on file  Social History Narrative  . Not on file    No family history on file.   Review of Systems  Constitutional: Negative.  Negative for chills and fever.  HENT: Negative.  Negative for congestion, nosebleeds and sore throat.   Eyes: Negative.  Negative for blurred vision and double vision.  Respiratory: Negative.  Negative for cough and shortness of breath.   Cardiovascular: Negative.  Negative for chest pain and  palpitations.  Gastrointestinal: Negative.  Negative for abdominal pain, diarrhea, nausea and vomiting.  Genitourinary: Negative.  Negative for dysuria, frequency and hematuria.  Musculoskeletal: Negative.  Negative for back pain, myalgias and neck pain.  Skin: Negative.  Negative for rash.  Neurological: Negative.  Negative for dizziness and headaches.  Endo/Heme/Allergies: Negative.   All other systems reviewed and are negative.  Vitals:   04/17/18 1422  BP: (!) 155/96  Pulse: (!) 107  Resp: 16  Temp: 98.1 F (36.7 C)  SpO2: 96%     Physical Exam  Constitutional:  He is oriented to person, place, and time. He appears well-developed.  Obese  HENT:  Head: Normocephalic and atraumatic.  Nose: Nose normal.  Mouth/Throat: Oropharynx is clear and moist.  Eyes: Pupils are equal, round, and reactive to light. Conjunctivae and EOM are normal.  Neck: Normal range of motion. Neck supple.  Cardiovascular: Normal rate, regular rhythm and normal heart sounds.  Pulmonary/Chest: Effort normal and breath sounds normal.  Abdominal: Soft. There is no tenderness.  Musculoskeletal: Normal range of motion.  Neurological: He is alert and oriented to person, place, and time. No sensory deficit. He exhibits normal muscle tone.  Skin: Skin is warm and dry. Capillary refill takes less than 2 seconds. No rash noted.  Psychiatric: He has a normal mood and affect. His behavior is normal.  Vitals reviewed.  Results for orders placed or performed in visit on 04/17/18 (from the past 24 hour(s))  POCT glucose (manual entry)     Status: Abnormal   Collection Time: 04/17/18  3:07 PM  Result Value Ref Range   POC Glucose 440 (A) 70 - 99 mg/dl  POCT glycosylated hemoglobin (Hb A1C)     Status: Abnormal   Collection Time: 04/17/18  3:17 PM  Result Value Ref Range   Hemoglobin A1C 11.1 (A) 4.0 - 5.6 %   HbA1c POC (<> result, manual entry)     HbA1c, POC (prediabetic range)     HbA1c, POC (controlled  diabetic range)      A total of 40 minutes was spent in the room with the patient, greater than 50% of which was in counseling/coordination of care regarding diabetes, hypertension, and dyslipidemia, medications, nutrition, lifestyle changes, prognosis, and need for follow-up in 4 weeks.  ASSESSMENT & PLAN: Uncontrolled type 2 diabetes mellitus without complication ([place]) Uncontrolled diabetes.  Noncompliant with medications and diet.  Unable to tolerate metformin due to severe GI side effects.  Will start glipizide 5 mg twice a day and Trulicity 8.67 mg weekly.  Follow-up in 4 weeks.  Essential hypertension Uncontrolled hypertension.  Has been off medications.  Will start amlodipine 10 mg and lisinopril 10 mg daily.  Follow-up in 4 weeks.  Dyslipidemia Presently on no medication.  Will start Crestor 20 mg daily.  Estevon was seen today for annual exam and medication refill.  Diagnoses and all orders for this visit:  Uncontrolled type 2 diabetes mellitus without complication ([place]) -     POCT glucose (manual entry) -     POCT glycosylated hemoglobin (Hb A1C) -     Comprehensive metabolic panel -     CBC -     Lipid panel -     [facility] Diabetes Foot Exam -     HIV Antibody (routine testing w rflx) -     Microalbumin, urine -     glipiZIDE (GLUCOTROL) 5 MG tablet; Take 1 tablet (5 mg total) by mouth 2 (two) times daily before a meal. -     Dulaglutide (TRULICITY) 6.19 JK/9.3OI SOPN; Inject 0.75 mg into the skin once a week.  Dyslipidemia -     rosuvastatin (CRESTOR) 20 MG tablet; Take 1 tablet (20 mg total) by mouth daily.  Essential hypertension -     amLODipine (NORVASC) 10 MG tablet; Take 1 tablet (10 mg total) by mouth daily. -     lisinopril (PRINIVIL,ZESTRIL) 10 MG tablet; Take 1 tablet (10 mg total) by mouth daily.  Toothache -     clindamycin (CLEOCIN) 300 MG capsule; Take 1 capsule (300  mg total) by mouth 3 (three) times daily for 7 days.    Patient Instructions  Diabetes  [place] and Nutrition When you have diabetes (diabetes mellitus), it is very important to have healthy eating habits because your blood sugar (glucose) levels are greatly affected by what you eat and drink. Eating healthy foods in the appropriate amounts, at about the same times every day, can help you:  Control your blood glucose.  Lower your risk of heart disease.  Improve your blood pressure.  Reach or maintain a healthy weight.  Every person with diabetes is different, and each person has different needs for a meal plan. Your health care provider may recommend that you work with a diet and nutrition specialist (dietitian) to make a meal plan that is best for you. Your meal plan may vary depending on factors such as:  The calories you need.  The medicines you take.  Your weight.  Your blood glucose, blood pressure, and cholesterol levels.  Your activity level.  Other health conditions you have, such as heart or kidney disease.  How do carbohydrates affect me? Carbohydrates affect your blood glucose level more than any other type of food. Eating carbohydrates naturally increases the amount of glucose in your blood. Carbohydrate counting is a method for keeping track of how many carbohydrates you eat. Counting carbohydrates is important to keep your blood glucose at a healthy level, especially if you use insulin or take certain oral diabetes medicines. It is important to know how many carbohydrates you can safely have in each meal. This is different for every person. Your dietitian can help you calculate how many carbohydrates you should have at each meal and for snack. Foods that contain carbohydrates include:  Bread, cereal, rice, pasta, and crackers.  Potatoes and corn.  Peas, beans, and lentils.  Milk and yogurt.  Fruit and juice.  Desserts, such as cakes, cookies, ice cream, and candy.  How does alcohol affect me? Alcohol can cause a sudden decrease in blood glucose  (hypoglycemia), especially if you use insulin or take certain oral diabetes medicines. Hypoglycemia can be a life-threatening condition. Symptoms of hypoglycemia (sleepiness, dizziness, and confusion) are similar to symptoms of having too much alcohol. If your health care provider says that alcohol is safe for you, follow these guidelines:  Limit alcohol intake to no more than 1 drink per day for nonpregnant women and 2 drinks per day for men. One drink equals 12 oz of beer, 5 oz of wine, or 1 oz of hard liquor.  Do not drink on an empty stomach.  Keep yourself hydrated with water, diet soda, or unsweetened iced tea.  Keep in mind that regular soda, juice, and other mixers may contain a lot of sugar and must be counted as carbohydrates.  What are tips for following this plan? Reading food labels  Start by checking the serving size on the label. The amount of calories, carbohydrates, fats, and other nutrients listed on the label are based on one serving of the food. Many foods contain more than one serving per package.  Check the total grams (g) of carbohydrates in one serving. You can calculate the number of servings of carbohydrates in one serving by dividing the total carbohydrates by 15. For example, if a food has 30 g of total carbohydrates, it would be equal to 2 servings of carbohydrates.  Check the number of grams (g) of saturated and trans fats in one serving. Choose foods that have low or no  amount of these fats.  Check the number of milligrams (mg) of sodium in one serving. Most people should limit total sodium intake to less than 2,300 mg per day.  Always check the nutrition information of foods labeled as "low-fat" or "nonfat". These foods may be higher in added sugar or refined carbohydrates and should be avoided.  Talk to your dietitian to identify your daily goals for nutrients listed on the label. Shopping  Avoid buying canned, premade, or processed foods. These foods tend  to be high in fat, sodium, and added sugar.  Shop around the outside edge of the grocery store. This includes fresh fruits and vegetables, bulk grains, fresh meats, and fresh dairy. Cooking  Use low-heat cooking methods, such as baking, instead of high-heat cooking methods like deep frying.  Cook using healthy oils, such as olive, canola, or sunflower oil.  Avoid cooking with butter, cream, or high-fat meats. Meal planning  Eat meals and snacks regularly, preferably at the same times every day. Avoid going long periods of time without eating.  Eat foods high in fiber, such as fresh fruits, vegetables, beans, and whole grains. Talk to your dietitian about how many servings of carbohydrates you can eat at each meal.  Eat 4-6 ounces of lean protein each day, such as lean meat, chicken, fish, eggs, or tofu. 1 ounce is equal to 1 ounce of meat, chicken, or fish, 1 egg, or 1/4 cup of tofu.  Eat some foods each day that contain healthy fats, such as avocado, nuts, seeds, and fish. Lifestyle   Check your blood glucose regularly.  Exercise at least 30 minutes 5 or more days each week, or as told by your health care provider.  Take medicines as told by your health care provider.  Do not use any products that contain nicotine or tobacco, such as cigarettes and e-cigarettes. If you need help quitting, ask your health care provider.  Work with a Social worker or diabetes educator to identify strategies to manage stress and any emotional and social challenges. What are some questions to ask my health care provider?  Do I need to meet with a diabetes educator?  Do I need to meet with a dietitian?  What number can I call if I have questions?  When are the best times to check my blood glucose? Where to find more information:  American Diabetes Association: diabetes.org/food-and-fitness/food  Academy of Nutrition and Dietetics:  PokerClues.dk  [employer] of Diabetes and Digestive and Kidney Diseases (NIH): ContactWire.be Summary  A healthy meal plan will help you control your blood glucose and maintain a healthy lifestyle.  Working with a diet and nutrition specialist (dietitian) can help you make a meal plan that is best for you.  Keep in mind that carbohydrates and alcohol have immediate effects on your blood glucose levels. It is important to count carbohydrates and to use alcohol carefully. This information is not intended to replace advice given to you by your health care provider. Make sure you discuss any questions you have with your health care provider. Document Released: 01/21/2005 Document Revised: 05/31/2016 Document Reviewed: 05/31/2016 Elsevier Interactive Patient Education  2018 [employer]. Hypertension Hypertension, commonly called high blood pressure, is when the force of blood pumping through the arteries is too strong. The arteries are the blood vessels that carry blood from the heart throughout the body. Hypertension forces the heart to work harder to pump blood and may cause arteries to become narrow or stiff. Having untreated or uncontrolled hypertension  can cause heart attacks, strokes, kidney disease, and other problems. A blood pressure reading consists of a higher number over a lower number. Ideally, your blood pressure should be below 120/80. The first ("top") number is called the systolic pressure. It is a measure of the pressure in your arteries as your heart beats. The second ("bottom") number is called the diastolic pressure. It is a measure of the pressure in your arteries as the heart relaxes. What are the causes? The cause of this condition is not known. What increases the risk? Some risk factors for high blood pressure are under your control. Others are  not. Factors you can change  Smoking.  Having type 2 diabetes mellitus, high cholesterol, or both.  Not getting enough exercise or physical activity.  Being overweight.  Having too much fat, sugar, calories, or salt (sodium) in your diet.  Drinking too much alcohol. Factors that are difficult or impossible to change  Having chronic kidney disease.  Having a family history of high blood pressure.  Age. Risk increases with age.  Race. You may be at higher risk if you are African-American.  Gender. Men are at higher risk than women before age 42. After age 35, women are at higher risk than men.  Having obstructive sleep apnea.  Stress. What are the signs or symptoms? Extremely high blood pressure (hypertensive crisis) may cause:  Headache.  Anxiety.  Shortness of breath.  Nosebleed.  Nausea and vomiting.  Severe chest pain.  Jerky movements you cannot control (seizures).  How is this diagnosed? This condition is diagnosed by measuring your blood pressure while you are seated, with your arm resting on a surface. The cuff of the blood pressure monitor will be placed directly against the skin of your upper arm at the level of your heart. It should be measured at least twice using the same arm. Certain conditions can cause a difference in blood pressure between your right and left arms. Certain factors can cause blood pressure readings to be lower or higher than normal (elevated) for a short period of time:  When your blood pressure is higher when you are in a health care provider's office than when you are at home, this is called white coat hypertension. Most people with this condition do not need medicines.  When your blood pressure is higher at home than when you are in a health care provider's office, this is called masked hypertension. Most people with this condition may need medicines to control blood pressure.  If you have a high blood pressure reading during one  visit or you have normal blood pressure with other risk factors:  You may be asked to return on a different day to have your blood pressure checked again.  You may be asked to monitor your blood pressure at home for 1 week or longer.  If you are diagnosed with hypertension, you may have other blood or imaging tests to help your health care provider understand your overall risk for other conditions. How is this treated? This condition is treated by making healthy lifestyle changes, such as eating healthy foods, exercising more, and reducing your alcohol intake. Your health care provider may prescribe medicine if lifestyle changes are not enough to get your blood pressure under control, and if:  Your systolic blood pressure is above 130.  Your diastolic blood pressure is above 80.  Your personal target blood pressure may vary depending on your medical conditions, your age, and other factors. Follow these instructions  at home: Eating and drinking  Eat a diet that is high in fiber and potassium, and low in sodium, added sugar, and fat. An example eating plan is called the DASH (Dietary Approaches to Stop Hypertension) diet. To eat this way: ? Eat plenty of fresh fruits and vegetables. Try to fill half of your plate at each meal with fruits and vegetables. ? Eat whole grains, such as whole wheat pasta, brown rice, or whole grain bread. Fill about one quarter of your plate with whole grains. ? Eat or drink low-fat dairy products, such as skim milk or low-fat yogurt. ? Avoid fatty cuts of meat, processed or cured meats, and poultry with skin. Fill about one quarter of your plate with lean proteins, such as fish, chicken without skin, beans, eggs, and tofu. ? Avoid premade and processed foods. These tend to be higher in sodium, added sugar, and fat.  Reduce your daily sodium intake. Most people with hypertension should eat less than 1,500 mg of sodium a day.  Limit alcohol intake to no more than 1  drink a day for nonpregnant women and 2 drinks a day for men. One drink equals 12 oz of beer, 5 oz of wine, or 1 oz of hard liquor. Lifestyle  Work with your health care provider to maintain a healthy body weight or to lose weight. Ask what an ideal weight is for you.  Get at least 30 minutes of exercise that causes your heart to beat faster (aerobic exercise) most days of the week. Activities may include walking, swimming, or biking.  Include exercise to strengthen your muscles (resistance exercise), such as pilates or lifting weights, as part of your weekly exercise routine. Try to do these types of exercises for 30 minutes at least 3 days a week.  Do not use any products that contain nicotine or tobacco, such as cigarettes and e-cigarettes. If you need help quitting, ask your health care provider.  Monitor your blood pressure at home as told by your health care provider.  Keep all follow-up visits as told by your health care provider. This is important. Medicines  Take over-the-counter and prescription medicines only as told by your health care provider. Follow directions carefully. Blood pressure medicines must be taken as prescribed.  Do not skip doses of blood pressure medicine. Doing this puts you at risk for problems and can make the medicine less effective.  Ask your health care provider about side effects or reactions to medicines that you should watch for. Contact a health care provider if:  You think you are having a reaction to a medicine you are taking.  You have headaches that keep coming back (recurring).  You feel dizzy.  You have swelling in your ankles.  You have trouble with your vision. Get help right away if:  You develop a severe headache or confusion.  You have unusual weakness or numbness.  You feel faint.  You have severe pain in your chest or abdomen.  You vomit repeatedly.  You have trouble breathing. Summary  Hypertension is when the force  of blood pumping through your arteries is too strong. If this condition is not controlled, it may put you at risk for serious complications.  Your personal target blood pressure may vary depending on your medical conditions, your age, and other factors. For most people, a normal blood pressure is less than 120/80.  Hypertension is treated with lifestyle changes, medicines, or a combination of both. Lifestyle changes include weight loss, eating  a healthy, low-sodium diet, exercising more, and limiting alcohol. This information is not intended to replace advice given to you by your health care provider. Make sure you discuss any questions you have with your health care provider. Document Released: 04/26/2005 Document Revised: 03/24/2016 Document Reviewed: 03/24/2016 Elsevier Interactive Patient Education  2018 Elsevier Inc.      Agustina Caroli, MD [facility]

## 2018-04-17 NOTE — Assessment & Plan Note (Signed)
Uncontrolled diabetes.  Noncompliant with medications and diet.  Unable to tolerate metformin due to severe GI side effects.  Will start glipizide 5 mg twice a day and Trulicity 3.01 mg weekly.  Follow-up in 4 weeks.

## 2018-04-17 NOTE — Assessment & Plan Note (Signed)
Uncontrolled hypertension.  Has been off medications.  Will start amlodipine 10 mg and lisinopril 10 mg daily.  Follow-up in 4 weeks.

## 2018-04-18 ENCOUNTER — Encounter: Payer: Self-pay | Admitting: Emergency Medicine

## 2018-04-18 LAB — COMPREHENSIVE METABOLIC PANEL
ALBUMIN: 4.5 g/dL (ref 3.5–5.5)
ALK PHOS: 88 IU/L (ref 39–117)
ALT: 32 IU/L (ref 0–44)
AST: 20 IU/L (ref 0–40)
Albumin/Globulin Ratio: 1.6 (ref 1.2–2.2)
BILIRUBIN TOTAL: 0.2 mg/dL (ref 0.0–1.2)
BUN / CREAT RATIO: 14 (ref 9–20)
BUN: 11 mg/dL (ref 6–20)
CO2: 20 mmol/L (ref 20–29)
Calcium: 10 mg/dL (ref 8.7–10.2)
Chloride: 97 mmol/L (ref 96–106)
Creatinine, Ser: 0.81 mg/dL (ref 0.76–1.27)
GFR calc Af Amer: 133 mL/min/{1.73_m2} (ref >59)
GFR calc non Af Amer: 115 mL/min/{1.73_m2} (ref >59)
Globulin, Total: 2.8 g/dL (ref 1.5–4.5)
Sodium: 137 mmol/L (ref 134–144)
Total Protein: 7.3 g/dL (ref 6.0–8.5)

## 2018-04-18 LAB — CBC
Hematocrit: 41.9 % (ref 37.5–51.0)
Hemoglobin: 13.7 g/dL (ref 13.0–17.7)
MCH: 26 pg — ABNORMAL LOW (ref 26.6–33.0)
MCHC: 32.7 g/dL (ref 31.5–35.7)
MCV: 80 fL (ref 79–97)
PLATELETS: 332 10*3/uL (ref 150–450)
RBC: 5.27 x10E6/uL (ref 4.14–5.80)
RDW: 13.7 % (ref 12.3–15.4)
WBC: 10.3 10*3/uL (ref 3.4–10.8)

## 2018-04-18 LAB — LIPID PANEL
Chol/HDL Ratio: 7.1 ratio — ABNORMAL HIGH (ref 0.0–5.0)
Cholesterol, Total: 214 mg/dL — ABNORMAL HIGH (ref 100–199)
HDL: 30 mg/dL — AB (ref >39)
Triglycerides: 829 mg/dL (ref 0–149)

## 2018-04-18 LAB — HIV ANTIBODY (ROUTINE TESTING W REFLEX): HIV SCREEN 4TH GENERATION: NONREACTIVE

## 2018-04-18 LAB — MICROALBUMIN, URINE: Microalbumin, Urine: 123.6 ug/mL

## 2018-04-18 NOTE — Progress Notes (Signed)
Message sent via my chart

## 2018-05-05 DIAGNOSIS — J069 Acute upper respiratory infection, unspecified: Secondary | ICD-10-CM | POA: Diagnosis not present

## 2018-05-17 ENCOUNTER — Ambulatory Visit: Payer: 59 | Admitting: Emergency Medicine

## 2018-06-15 ENCOUNTER — Encounter (HOSPITAL_COMMUNITY): Payer: Self-pay | Admitting: Emergency Medicine

## 2018-06-15 ENCOUNTER — Emergency Department (HOSPITAL_COMMUNITY)
Admission: EM | Admit: 2018-06-15 | Discharge: 2018-06-16 | Disposition: A | Discharge status: Home / Self Care | Payer: 59 | Attending: Emergency Medicine | Admitting: Emergency Medicine

## 2018-06-15 ENCOUNTER — Other Ambulatory Visit: Payer: Self-pay

## 2018-06-15 ENCOUNTER — Emergency Department (HOSPITAL_COMMUNITY): Payer: 59

## 2018-06-15 DIAGNOSIS — R1084 Generalized abdominal pain: Secondary | ICD-10-CM | POA: Diagnosis not present

## 2018-06-15 DIAGNOSIS — Z7984 Long term (current) use of oral hypoglycemic drugs: Secondary | ICD-10-CM | POA: Insufficient documentation

## 2018-06-15 DIAGNOSIS — K760 Fatty (change of) liver, not elsewhere classified: Secondary | ICD-10-CM | POA: Diagnosis not present

## 2018-06-15 DIAGNOSIS — Z7982 Long term (current) use of aspirin: Secondary | ICD-10-CM | POA: Insufficient documentation

## 2018-06-15 DIAGNOSIS — Z79899 Other long term (current) drug therapy: Secondary | ICD-10-CM | POA: Diagnosis not present

## 2018-06-15 DIAGNOSIS — E1165 Type 2 diabetes mellitus with hyperglycemia: Secondary | ICD-10-CM | POA: Diagnosis not present

## 2018-06-15 DIAGNOSIS — R42 Dizziness and giddiness: Secondary | ICD-10-CM | POA: Diagnosis not present

## 2018-06-15 DIAGNOSIS — Z87891 Personal history of nicotine dependence: Secondary | ICD-10-CM | POA: Insufficient documentation

## 2018-06-15 DIAGNOSIS — R109 Unspecified abdominal pain: Secondary | ICD-10-CM | POA: Diagnosis present

## 2018-06-15 DIAGNOSIS — I10 Essential (primary) hypertension: Secondary | ICD-10-CM | POA: Insufficient documentation

## 2018-06-15 DIAGNOSIS — R739 Hyperglycemia, unspecified: Secondary | ICD-10-CM

## 2018-06-15 DIAGNOSIS — K922 Gastrointestinal hemorrhage, unspecified: Secondary | ICD-10-CM | POA: Insufficient documentation

## 2018-06-15 LAB — URINALYSIS, ROUTINE W REFLEX MICROSCOPIC
Bacteria, UA: NONE SEEN
Bilirubin Urine: NEGATIVE
Glucose, UA: >=500 mg/dL — AB
Hgb urine dipstick: NEGATIVE
Ketones, ur: 20 mg/dL — AB
Leukocytes, UA: NEGATIVE
Nitrite: NEGATIVE
Protein, ur: 30 mg/dL — AB
Specific Gravity, Urine: 1.027 (ref 1.005–1.030)
pH: 6 (ref 5.0–8.0)

## 2018-06-15 LAB — CBC WITH DIFFERENTIAL/PLATELET
Abs Immature Granulocytes: 0.04 10*3/uL (ref 0.00–0.07)
BASOS PCT: 0 %
Basophils Absolute: 0 10*3/uL (ref 0.0–0.1)
Eosinophils Absolute: 0.1 10*3/uL (ref 0.0–0.5)
Eosinophils Relative: 1 %
HCT: 41.2 % (ref 39.0–52.0)
Hemoglobin: 13.1 g/dL (ref 13.0–17.0)
Immature Granulocytes: 0 %
Lymphocytes Relative: 23 %
Lymphs Abs: 2.1 10*3/uL (ref 0.7–4.0)
MCH: 25.7 pg — ABNORMAL LOW (ref 26.0–34.0)
MCHC: 31.8 g/dL (ref 30.0–36.0)
MCV: 80.8 fL (ref 80.0–100.0)
Monocytes Absolute: 0.5 10*3/uL (ref 0.1–1.0)
Monocytes Relative: 5 %
Neutro Abs: 6.7 10*3/uL (ref 1.7–7.7)
Neutrophils Relative %: 71 %
Platelets: 300 10*3/uL (ref 150–400)
RBC: 5.1 MIL/uL (ref 4.22–5.81)
RDW: 13.7 % (ref 11.5–15.5)
WBC: 9.4 10*3/uL (ref 4.0–10.5)
nRBC: 0 % (ref 0.0–0.2)

## 2018-06-15 LAB — COMPREHENSIVE METABOLIC PANEL
ALT: 34 U/L (ref 0–44)
AST: 31 U/L (ref 15–41)
Albumin: 3.9 g/dL (ref 3.5–5.0)
Alkaline Phosphatase: 68 U/L (ref 38–126)
Anion gap: 13 (ref 5–15)
BUN: 11 mg/dL (ref 6–20)
CHLORIDE: 102 mmol/L (ref 98–111)
CO2: 22 mmol/L (ref 22–32)
Calcium: 9.5 mg/dL (ref 8.9–10.3)
Creatinine, Ser: 1.03 mg/dL (ref 0.61–1.24)
GFR calc Af Amer: >60 mL/min (ref >60)
GFR calc non Af Amer: >60 mL/min (ref >60)
Glucose, Bld: 363 mg/dL — ABNORMAL HIGH (ref 70–99)
POTASSIUM: 4 mmol/L (ref 3.5–5.1)
Sodium: 137 mmol/L (ref 135–145)
Total Bilirubin: 0.7 mg/dL (ref 0.3–1.2)
Total Protein: 7.3 g/dL (ref 6.5–8.1)

## 2018-06-15 LAB — POCT I-STAT EG7
Bicarbonate: 24.2 mmol/L (ref 20.0–28.0)
Calcium, Ion: 1.14 mmol/L — ABNORMAL LOW (ref 1.15–1.40)
HEMATOCRIT: 39 % (ref 39.0–52.0)
HEMOGLOBIN: 13.3 g/dL (ref 13.0–17.0)
O2 Saturation: 98 %
Potassium: 3.7 mmol/L (ref 3.5–5.1)
Sodium: 137 mmol/L (ref 135–145)
TCO2: 25 mmol/L (ref 22–32)
pCO2, Ven: 36.1 mmHg — ABNORMAL LOW (ref 44.0–60.0)
pH, Ven: 7.433 — ABNORMAL HIGH (ref 7.250–7.430)
pO2, Ven: 97 mmHg — ABNORMAL HIGH (ref 32.0–45.0)

## 2018-06-15 LAB — CBG MONITORING, ED: GLUCOSE-CAPILLARY: 270 mg/dL — AB (ref 70–99)

## 2018-06-15 LAB — POC OCCULT BLOOD, ED: Fecal Occult Bld: POSITIVE — AB

## 2018-06-15 LAB — LIPASE, BLOOD: Lipase: 21 U/L (ref 11–51)

## 2018-06-15 MED ORDER — LACTATED RINGERS IV BOLUS
1000.0000 mL | Freq: Once | INTRAVENOUS | Status: AC
  Administered 2018-06-15: 1000 mL via INTRAVENOUS

## 2018-06-15 MED ORDER — IOHEXOL 300 MG/ML  SOLN
100.0000 mL | Freq: Once | INTRAMUSCULAR | Status: AC | PRN
  Administered 2018-06-15: 100 mL via INTRAVENOUS

## 2018-06-15 NOTE — ED Triage Notes (Signed)
BIB [facility] from work with c/o of lower abdominal pain on both left and right sides. Per EMS pt had bowel movement before leaving with both coffee ground dark stool and bright red stool.

## 2018-06-15 NOTE — ED Provider Notes (Signed)
[facility]PART[facility] Provider Note   CSN: [id number] Arrival date & time: 06/15/18  2040     History   Chief Complaint Chief Complaint  Patient presents with  . Abdominal Pain    HPI [name] is a 38 y.o. male.  The history is provided by the patient. No language interpreter was used.  Abdominal Pain     38 year old male with history of GERD, anemia, anxiety, diabetes, chronic back pain brought here via EMS from work for evaluation of abdominal pain.  Patient report he was at work today when he felt a flushing sensation throughout his body and urge to have bowel movement.  He went to use the bathroom and states he did produce a small amount of stool but noticed moderate amount of bright red blood in the toilet bowl.  This is new he has never had this problem before.  He also endorsed pain to his lower abdomen described as sharp sensation most prominent to the left lower quadrant.  Pain initially was 5 out of 10 and it is now 3 out of 10.  He denies any associated fever or chills no lightheadedness or dizziness no chest pain shortness of breath productive cough dysuria hematuria.  Denies any nausea or vomiting.  No hematemesis or hemoptysis and denies any black or tarry stool.  He does admits to taking ibuprofen on a daily basis for quite a while for his chronic back pain.  He endorsed chewing tobacco but denies alcohol or tobacco abuse.  Past Medical History:  Diagnosis Date  . Allergies   . Anemia   . Anxiety   . Depression   . Diabetes mellitus   . GERD (gastroesophageal reflux disease)   . Hyperlipidemia   . Hypertension   . Lumbar disc herniation   . 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 PTSD (post-traumatic stress disorder)   . Scoliosis     Patient Active Problem List   Diagnosis Date Noted  . Essential hypertension 04/17/2018  . Dyslipidemia 04/17/2018  . PTSD (post-traumatic stress disorder)  08/11/2016  . Spinal stenosis, lumbar region with neurogenic claudication 05/05/2016  . Left epididymitis 03/16/2016  . Dysthymia 09/02/2015  . Chronic low back pain 09/02/2015  . Uncontrolled type 2 diabetes mellitus without complication ([place]) 99/24/2683  . UNSPECIFIED ANEMIA 03/11/2008    Past Surgical History:  Procedure Laterality Date  . LUMBAR LAMINECTOMY/DECOMPRESSION MICRODISCECTOMY Left 05/05/2016   Procedure: CENTRAL DECOMPRESSION L4-L5 AND FORAMINOTOMY FOR L4 ROOT AND L5 ROOT ON THE LEFT;  Surgeon: Latanya Maudlin, MD;  Location: [facility];  Service: Orthopedics;  Laterality: Left;  . NO PAST SURGERIES          Home Medications    Prior to Admission medications   Medication Sig Start Date End Date Taking? Authorizing Provider  amLODipine (NORVASC) 10 MG tablet Take 1 tablet (10 mg total) by mouth daily. 04/17/18   Horald Pollen, MD  aspirin EC 81 MG tablet Take 1 tablet (81 mg total) by mouth daily. 07/11/14   Shawnee Knapp, MD  diphenhydrAMINE (BENADRYL) 25 mg capsule Take 25-50 mg by mouth daily as needed for allergies (DEPENDS ON HOW BAD ALLERGIES IF TAKES 25-50 MG).    [provider]  glipiZIDE (GLUCOTROL) 5 MG tablet Take 1 tablet (5 mg total) by mouth 2 (two) times daily before a meal. 04/17/18   Sagardia, Ines Bloomer, MD  ibuprofen (ADVIL,MOTRIN) 200 MG tablet Take  800 mg by mouth every 6 (six) hours as needed for mild pain.    [provider]  lisinopril (PRINIVIL,ZESTRIL) 10 MG tablet Take 1 tablet (10 mg total) by mouth daily. 04/17/18   Horald Pollen, MD  rosuvastatin (CRESTOR) 20 MG tablet Take 1 tablet (20 mg total) by mouth daily. 04/17/18   Horald Pollen, MD  traZODone (DESYREL) 150 MG tablet Take 150 mg by mouth at bedtime.    [provider]    Family History Family History  Problem Relation Age of Onset  . Stroke Maternal Grandmother     Social History Social History   Tobacco Use  . Smoking status: Former  Smoker    Types: Cigars  . Smokeless tobacco: Former Systems developer    Types: Snuff    Quit date: 04/14/2016  Substance Use Topics  . Alcohol use: Yes    Alcohol/week: 5.0 standard drinks    Types: 5 Shots of liquor per week    Comment: few times year  . Drug use: No     Allergies   Amoxicillin; Penicillins; Metformin and related; and Other   Review of Systems Review of Systems  Gastrointestinal: Positive for abdominal pain and blood in stool. Negative for rectal pain.  All other systems reviewed and are negative.    Physical Exam Updated Vital Signs BP (!) 185/116 (BP Location: Right Arm)   Pulse (!) 112   Temp (!) 97.2 F (36.2 C) (Oral)   Resp 16   Ht 6\' 3"  (1.905 m)   Wt (!) 145.2 kg   SpO2 98%   BMI 40.00 kg/m   Physical Exam Vitals signs and nursing note reviewed.  Constitutional:      General: He is not in acute distress.    Appearance: He is well-developed.  HENT:     Head: Atraumatic.  Eyes:     Conjunctiva/sclera: Conjunctivae normal.  Neck:     Musculoskeletal: Neck supple.  Cardiovascular:     Rate and Rhythm: Tachycardia present.  Pulmonary:     Effort: Pulmonary effort is normal.     Breath sounds: Normal breath sounds.  Abdominal:     General: Abdomen is flat.     Tenderness: There is abdominal tenderness in the left lower quadrant. Negative signs include Murphy's sign and McBurney's sign.  Genitourinary:    Comments: Chaperone present during exam.  No thrombosed external hemorrhoid, normal rectal tone, no obvious mass, normal color stool on glove. Skin:    Findings: No rash.  Neurological:     Mental Status: He is alert.      ED Treatments / Results  Labs (all labs ordered are listed, but only abnormal results are displayed) Labs Reviewed  CBC WITH DIFFERENTIAL/PLATELET - Abnormal; Notable for the following components:      Result Value   MCH 25.7 (*)    All other components within normal limits  COMPREHENSIVE METABOLIC PANEL - Abnormal;  Notable for the following components:   Glucose, Bld 363 (*)    All other components within normal limits  URINALYSIS, ROUTINE W REFLEX MICROSCOPIC - Abnormal; Notable for the following components:   Color, Urine STRAW (*)    Glucose, UA >=500 (*)    Ketones, ur 20 (*)    Protein, ur 30 (*)    All other components within normal limits  CBG MONITORING, ED - Abnormal; Notable for the following components:   Glucose-Capillary 270 (*)    All other components within normal limits  POC OCCULT BLOOD, ED - Abnormal; Notable for the following components:   Fecal Occult Bld POSITIVE (*)    All other components within normal limits  POCT I-STAT EG7 - Abnormal; Notable for the following components:   pH, Ven 7.433 (*)    pCO2, Ven 36.1 (*)    pO2, Ven 97.0 (*)    Calcium, Ion 1.14 (*)    All other components within normal limits  LIPASE, BLOOD  CBG MONITORING, ED    EKG None  Radiology Ct Abdomen Pelvis W Contrast  Result Date: 06/15/2018 CLINICAL DATA:  Lower abdominal pain and red stools beginning tonight at 7 p.m. Mainly mid abdominal pain. EXAM: CT ABDOMEN AND PELVIS WITH CONTRAST TECHNIQUE: Multidetector CT imaging of the abdomen and pelvis was performed using the standard protocol following bolus administration of intravenous contrast. CONTRAST:  143mL OMNIPAQUE IOHEXOL 300 MG/ML  SOLN COMPARISON:  03/24/2012 FINDINGS: Lower chest: Calcified granuloma in the right lung base. Lung bases otherwise clear. Hepatobiliary: Diffuse fatty infiltration of the liver. No focal liver abnormality is seen. No gallstones, gallbladder wall thickening, or biliary dilatation. Pancreas: Unremarkable. No pancreatic ductal dilatation or surrounding inflammatory changes. Spleen: Normal in size without focal abnormality. Adrenals/Urinary Tract: No adrenal gland nodules. Small cyst in the upper pole right kidney. Renal nephrograms are homogeneous and symmetrical. No hydronephrosis or hydroureter. Bladder is  unremarkable. Stomach/Bowel: Stomach, small bowel, and colon are mostly decompressed. Decompression limits evaluation of the bowel wall but no discrete areas of wall thickening or infiltration or identified. Appendix is normal. Vascular/Lymphatic: No significant vascular findings are present. No enlarged abdominal or pelvic lymph nodes. Reproductive: Prostate is unremarkable. Other: No abdominal wall hernia or abnormality. No abdominopelvic ascites. Musculoskeletal: Postoperative L4-5 laminectomy. Mild degenerative changes in the lower lumbar spine. No destructive bone lesions. IMPRESSION: No acute process demonstrated in the abdomen or pelvis. No evidence of bowel obstruction or inflammation. Diffuse fatty infiltration of the liver. Electronically Signed   By: Lucienne Capers M.D.   On: 06/15/2018 22:33    Procedures Procedures (including critical care time)  Medications Ordered in ED Medications  lactated ringers bolus 1,000 mL (1,000 mLs Intravenous New Bag/Given 06/15/18 2151)  iohexol (OMNIPAQUE) 300 MG/ML solution 100 mL (100 mLs Intravenous Contrast Given 06/15/18 2216)     Initial Impression / Assessment and Plan / ED Course  I have reviewed the triage vital signs and the nursing notes.  Pertinent labs & imaging results that were available during my care of the patient were reviewed by me and considered in my medical decision making (see chart for details).     BP (!) 152/93   Pulse (!) 113   Temp (!) 97.2 F (36.2 C) (Oral)   Resp 16   Ht 6\' 3"  (1.905 m)   Wt (!) 145.2 kg   SpO2 97%   BMI 40.00 kg/m    Final Clinical Impressions(s) / ED Diagnoses   Final diagnoses:  Lower GI bleed  Hyperglycemia    ED Discharge Orders    None     9:28 PM Patient here with complaints of bright red blood per rectum.  Does admits to using ibuprofen on a daily basis but denies any upper abdominal pain or coffee-ground emesis.  He does have tenderness to left lower quadrant therefore  abdominal pelvis CT scan ordered to rule out diverticulitis or IBD.  Patient otherwise well.  Here he is tachycardic and hypertensive.  IV fluid given.  Pain medication offered but patient declined.  11:39 PM UA shows some ketones and proteins but no signs of urinary tract infection.  Fecal blood test is positive.  Electrolytes panels are mostly reassuring, normal CBC, normal H&H, blood sugar is elevated at 363 without anion gap, normal lipase, abdominal pelvis CT scan without any acute process.  Suspect rectal bleed likely secondary to internal hemorrhoid however patient will benefit from follow-up with GI for colonoscopy to assess the source of bleeding.  I also encouraged patient to follow-up with PCP for better management of his blood sugar.  After receiving IV fluid, CBG did improve to 273.  Encourage patient to eat foods high in fiber and to avoid prolonged sitting if this is related to his hemorrhoid.  Return precaution discussed.  Patient otherwise stable for discharge.   [name], PA-C 06/15/18 2343    Lennice Sites, DO 06/16/18 9068

## 2018-06-15 NOTE — Discharge Instructions (Addendum)
Your rectal bleed may be due to hemorrhoid, however please call and follow-up closely with gastroenterologist for further evaluation which may include a colonoscopy.  Your blood sugar is high today.  Take your medication as prescribed and follow-up closely with your primary care doctor for further management of the diabetes.  Return if you have any concern.

## 2018-07-07 ENCOUNTER — Encounter: Payer: Self-pay | Admitting: Gastroenterology

## 2018-07-13 ENCOUNTER — Ambulatory Visit: Payer: 59 | Admitting: Gastroenterology

## 2018-07-13 ENCOUNTER — Encounter: Payer: Self-pay | Admitting: Gastroenterology

## 2018-07-13 VITALS — BP 160/90 | HR 111 | Ht 75.0 in | Wt 320.0 lb

## 2018-07-13 DIAGNOSIS — K625 Hemorrhage of anus and rectum: Secondary | ICD-10-CM | POA: Diagnosis not present

## 2018-07-13 MED ORDER — NA SULFATE-K SULFATE-MG SULF 17.5-3.13-1.6 GM/177ML PO SOLN: 1.0000 | ORAL | 0 refills | Status: AC

## 2018-07-13 NOTE — Patient Instructions (Signed)
You have been scheduled for a colonoscopy. Please follow written instructions given to you at your visit today.  Please pick up your prep supplies at the pharmacy within the next 1-3 days. If you use inhalers (even only as needed), please bring them with you on the day of your procedure. Your physician has requested that you go to www.startemmi.com and enter the access code given to you at your visit today. This web site gives a general overview about your procedure. However, you should still follow specific instructions given to you by our office regarding your preparation for the procedure.  Tips for colonoscopy:  -STAY WELL HYDRATED FOR 3-4 DAYS PRIOR TO THE EXAM. This reduces nausea and dehydration.  -TO PREVENT SKIN/HEMORRHOID IRRITATION- prior to wiping, put A&Dointment or vaseline on the toilet paper. -Keep a towel or pad on the bed.  -DRINK 64oz of clear liquids in the morning of prep day (PRIOR TO STARTING THE PREP) to be sure that there is enough fluid to flush the colon and stay hydrated!!!! This is in addition to the fluids required for preparation.  

## 2018-07-13 NOTE — Progress Notes (Signed)
Referring Provider: Horald Pollen, * Primary Care Physician:  Horald Pollen, MD   Reason for Consultation:  Rectal bleeding   IMPRESSION:  Rectal bleeding NSAIDs for chronic back pain History of microcytic anemia in 2009, hemoglobin > 13 since that time No known family history of colon cancer or polyps  The differential for rectal bleeding is broad.  It includes outlet sources such as fissure or hemorrhoids, as well as polyps, mass, ulcers, and colitis.  He has no identified extra GI manifestations of IBD.  Given this differential I am recommending a colonoscopy.  PLAN: Colonoscopy  I consented the patient at the bedside today discussing the risks, benefits, and alternatives to endoscopic evaluation. In particular, we discussed the risks that include, but are not limited to, reaction to medication, cardiopulmonary compromise, bleeding requiring blood transfusion, aspiration resulting in pneumonia, perforation requiring surgery, lack of diagnosis, severe illness requiring hospitalization, and even death. We reviewed the risk of missed lesion including polyps or even cancer. The patient acknowledges these risks and asks that we proceed.  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 The history is obtained through the patient and review of his electronic health record.  He has anxiety, depression, type II diabetes, hypercholesterolemia, hypertension, and obesity.  He chronic back pain and had a laminectomy/discectomy L4-L5 in 2017.  He frequently uses ibuprofen to manage his pain.  Had an episode at work were he felt light headed, felt nonradiating, sharp, lower abdominal cramping, and then had a bloody bowel movement last month.  At that time he filled this toilet bowl with bright red blood.  Found to have hypertension by EMS in route to the ED.  I reviewed those results from the ED. A CT of the  abdomen and pelvis with contrast 06/15/18 revealed no abdominal abnormalities.  Labs in the ED included a normal CMP with normal liver enzymes and normal lipase.  Hemoglobin was 13.1, MCV 80.8, RDW 13.7, platelets 300.  He was found to be fecal occult blood positive in the ED.  Two other episodes since last month but the volume of blood was less. Had associated cramping each episode.   Chronic diarrhea. Baseline loose stools for years. Has at least 3 BM daily for years. Occassional mucous. No blood prior to the ED visit last month.  No rectal pain or tearing. Sense of complete evacuation.   He has full custody of 1 year and a year old.   No known family history of colon cancer or polyps.   No prior history endoscopy.  However, Mr. Koman was seen by Dr. Fuller Plan in 2010 as an outpatient for the evaluation of anemia.  Dr. Silvio Pate note mentions mild anemia, borderline microcytic, associated with abnormal iron studies.  No GI symptoms.  Hematology consultation was recommended.  Endoscopic work-up and celiac testing would be performed if he was iron deficient.  The patient does not remember seeing hematology or having an iron deficiency evaluation.  Epic shows an iron level of 2612/18/2009.  His hemoglobin at that time was 12.9.  No other iron study results are available to me.  His hemoglobin has been above 13 since that time.   Past Medical History:  Diagnosis Date  . Allergies   . Anemia   . Anxiety   . Depression   . Diabetes mellitus   . GERD (gastroesophageal reflux disease)   . Hyperlipidemia   . Hypertension   .  Lumbar disc herniation   . 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 PTSD (post-traumatic stress disorder)   . Scoliosis     Past Surgical History:  Procedure Laterality Date  . LUMBAR LAMINECTOMY/DECOMPRESSION MICRODISCECTOMY Left 05/05/2016   Procedure: CENTRAL DECOMPRESSION L4-L5 AND FORAMINOTOMY FOR L4 ROOT AND L5 ROOT ON THE LEFT;  Surgeon: Latanya Maudlin, MD;  Location: [facility];  Service: Orthopedics;  Laterality: Left;  . NO PAST SURGERIES      Current Outpatient Medications  Medication Sig Dispense Refill  . amitriptyline (ELAVIL) 25 MG tablet Take 25-50 mg by mouth daily as needed for sleep.    [name] amLODipine (NORVASC) 10 MG tablet Take 1 tablet (10 mg total) by mouth daily. 90 tablet 3  . aspirin EC 81 MG tablet Take 1 tablet (81 mg total) by mouth daily.    . diphenhydrAMINE (BENADRYL) 25 mg capsule Take 25-50 mg by mouth daily as needed for allergies (DEPENDS ON HOW BAD ALLERGIES IF TAKES 25-50 MG).    . Dulaglutide (TRULICITY) 1.61 WR/6.0AV SOPN Inject 1 pen into the skin every Saturday.    . DULoxetine (CYMBALTA) 20 MG capsule Take 20 mg by mouth at bedtime.    [name] glipiZIDE (GLUCOTROL) 5 MG tablet Take 1 tablet (5 mg total) by mouth 2 (two) times daily before a meal. 60 tablet 3  . ibuprofen (ADVIL,MOTRIN) 200 MG tablet Take 800 mg by mouth every 6 (six) hours as needed for mild pain.    [name] lisinopril (PRINIVIL,ZESTRIL) 10 MG tablet Take 1 tablet (10 mg total) by mouth daily. 90 tablet 3  . rosuvastatin (CRESTOR) 20 MG tablet Take 1 tablet (20 mg total) by mouth daily. 90 tablet 3  . Na Sulfate-K Sulfate-Mg Sulf 17.5-3.13-1.6 GM/177ML SOLN Take 1 kit by mouth as directed for 30 days. 354 mL 0   No current facility-administered medications for this visit.     Allergies as of 07/13/2018 - Review Complete 07/13/2018  Allergen Reaction Noted  . Amoxicillin Anaphylaxis and Other (See Comments) 09/19/2011  . Penicillins Anaphylaxis and Other (See Comments) 10/20/2015  . Metformin and related Other (See Comments) 04/17/2018  . Other Other (See Comments) 10/16/2013    Family History  Problem Relation Age of Onset  . Stroke Maternal Grandmother     Social History   Socioeconomic History  . Marital status: Married    Spouse name: Not on file  . Number of children: Not on file  . Years of education: Not on file  . Highest  education level: Not on file  Occupational History  . Not on file  Social Needs  . Financial resource strain: Not on file  . Food insecurity:    Worry: Not on file    Inability: Not on file  . Transportation needs:    Medical: Not on file    Non-medical: Not on file  Tobacco Use  . Smoking status: Former Smoker    Types: Cigars  . Smokeless tobacco: Former Systems developer    Types: Snuff    Quit date: 04/14/2016  Substance and Sexual Activity  . Alcohol use: Yes    Alcohol/week: 5.0 standard drinks    Types: 5 Shots of liquor per week    Comment: few times year  . Drug use: No  . Sexual activity: Not on file  Lifestyle  . Physical activity:    Days per week: Not on file    Minutes per session: Not on file  .  Stress: Not on file  Relationships  . Social connections:    Talks on phone: Not on file    Gets together: Not on file    Attends religious service: Not on file    Active member of club or organization: Not on file    Attends meetings of clubs or organizations: Not on file    Relationship status: Not on file  . Intimate partner violence:    Fear of current or ex partner: Not on file    Emotionally abused: Not on file    Physically abused: Not on file    Forced sexual activity: Not on file  Other Topics Concern  . Not on file  Social History Narrative  . Not on file    Review of Systems: 12 system ROS is negative except as noted above the additions of anxiety, depression, insomnia, excessive thirst, and excessive urination.  Filed Weights   07/13/18 0913  Weight: (!) 320 lb (145.2 kg)    Physical Exam: Vital signs were reviewed. General:   Alert, well-nourished, pleasant and cooperative in NAD Head:  Normocephalic and atraumatic. Eyes:  Sclera clear, no icterus.   Conjunctiva pink. Mouth:  No deformity or lesions.   Neck:  Supple; no thyromegaly. Lungs:  Clear throughout to auscultation.   No wheezes.  Heart:  Regular rate and rhythm; no murmurs Abdomen:  Soft,  nontender, normal bowel sounds. No rebound or guarding. No hepatosplenomegaly Rectal:  Deferred  Msk:  Symmetrical without gross deformities. Extremities:  No gross deformities or edema. Neurologic:  Alert and  oriented x4;  grossly nonfocal Skin:  No rash or bruise. Psych:  Alert and cooperative. Normal mood and affect.   Beckham Capistran L. Tarri Glenn, MD, MPH [facility] 07/13/2018, 10:06 AM

## 2018-08-17 ENCOUNTER — Telehealth: Payer: Self-pay | Admitting: *Deleted

## 2018-08-17 NOTE — Telephone Encounter (Signed)
Spoke with patient.  Informed him that he will need to be moved to another day since we are only working tuesdays and thursdays.  Dr. Tarri Glenn asked that he have procedure but needed to move to another day.  Pt wants to stay with Dr. Tarri Glenn but he is not available on 4/23 which is her next scheduled day.  He will check his work schedule and call back.

## 2018-08-17 NOTE — Telephone Encounter (Signed)
LM on VM for patient to call back to reschedule appointment d/t covid 19 restrictions.

## 2018-08-21 ENCOUNTER — Encounter: Payer: 59 | Admitting: Gastroenterology

## 2018-09-11 ENCOUNTER — Other Ambulatory Visit: Payer: Self-pay

## 2018-09-11 ENCOUNTER — Telehealth (INDEPENDENT_AMBULATORY_CARE_PROVIDER_SITE_OTHER): Payer: 59 | Admitting: Registered Nurse

## 2018-09-11 DIAGNOSIS — J0100 Acute maxillary sinusitis, unspecified: Secondary | ICD-10-CM | POA: Diagnosis not present

## 2018-09-11 MED ORDER — DOXYCYCLINE HYCLATE 100 MG PO TABS: 100.0000 mg | ORAL_TABLET | Freq: Two times a day (BID) | ORAL | 0 refills | Status: AC

## 2018-09-11 NOTE — Progress Notes (Signed)
Pt states he has been having head pressure  with ear pain x 5 days on the left side. Pt states there is no nasal drainage or congestion. Pt states there is no other symptom but think its a sinus infection. He did have a tooth pulled around the same time the pressure and pain started on the left side but states that has cleared up.

## 2018-09-11 NOTE — Progress Notes (Addendum)
Acute Office Visit  Subjective:    Patient ID: [name], male    DOB: April 23, 1981, 38 y.o.   MRN: [id number]  CC: Sinus pressure/ pain  HPI Patient is in today for acute sinus pressure / pain. Onset 4 days prior to visit. L sided sinus pressure/pain, ear pain. Constant duration. Dull, achey pain. TTP maxillary sinus, frontal sinus less so. Hearing not diminished on L side. Denies alleviating or aggravating factors. Has not tried treatment.  Pt recently seen for wisdom teeth removal 2 weeks prior to this visit. At that time pt had 7 day course of clindomycin. He reports it was a painless procedure and did not take the hydrocodone he was given.   He denies fever, nausea, vomiting, diarrhea, cough, SOB. He endorses a penicillin allergy.   He states that he has seasonal allergies, but they haven't been these symptoms.  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    PTSD (post-traumatic stress disorder)    Scoliosis     Past Surgical History:  Procedure Laterality Date   LUMBAR LAMINECTOMY/DECOMPRESSION MICRODISCECTOMY Left 05/05/2016   Procedure: CENTRAL DECOMPRESSION L4-L5 AND FORAMINOTOMY FOR L4 ROOT AND L5 ROOT ON THE LEFT;  Surgeon: Latanya Maudlin, MD;  Location: [facility];  Service: Orthopedics;  Laterality: Left;   NO PAST SURGERIES      Family History  Problem Relation Age of Onset   Stroke Maternal Grandmother     Social History   Socioeconomic History   Marital status: Married    Spouse name: Not on file   Number of children: Not on file   Years of education: Not on file   Highest education level: Not on file  Occupational History   Not on file  Social Needs   Financial resource strain: Not on file    Food insecurity:    Worry: Not on file    Inability: Not on file   Transportation needs:    Medical: Not on file    Non-medical: Not on file  Tobacco Use   Smoking status: Former Smoker    Types: Cigars   Smokeless tobacco: Former Systems developer    Types: Snuff    Quit date: 04/14/2016  Substance and Sexual Activity   Alcohol use: Yes    Alcohol/week: 5.0 standard drinks    Types: 5 Shots of liquor per week    Comment: few times year   Drug use: No   Sexual activity: Not on file  Lifestyle   Physical activity:    Days per week: Not on file    Minutes per session: Not on file   Stress: Not on file  Relationships   Social connections:    Talks on phone: Not on file    Gets together: Not on file    Attends religious service: Not on file    Active member of club or organization: Not on file    Attends meetings of clubs or organizations: Not on file    Relationship status: Not on file   Intimate partner violence:    Fear of current or ex partner: Not on file    Emotionally abused: Not on file    Physically  abused: Not on file    Forced sexual activity: Not on file  Other Topics Concern   Not on file  Social History Narrative   Not on file    Outpatient Medications Prior to Visit  Medication Sig Dispense Refill   amitriptyline (ELAVIL) 25 MG tablet Take 25-50 mg by mouth daily as needed for sleep.     amLODipine (NORVASC) 10 MG tablet Take 1 tablet (10 mg total) by mouth daily. 90 tablet 3   aspirin EC 81 MG tablet Take 1 tablet (81 mg total) by mouth daily.     diphenhydrAMINE (BENADRYL) 25 mg capsule Take 25-50 mg by mouth daily as needed for allergies (DEPENDS ON HOW BAD ALLERGIES IF TAKES 25-50 MG).     DULoxetine (CYMBALTA) 20 MG capsule Take 20 mg by mouth at bedtime.     ibuprofen (ADVIL,MOTRIN) 200 MG tablet Take 800 mg by mouth every 6 (six) hours as needed for mild pain.     lisinopril (PRINIVIL,ZESTRIL) 10 MG tablet Take 1 tablet (10 mg total) by mouth  daily. 90 tablet 3   rosuvastatin (CRESTOR) 20 MG tablet Take 1 tablet (20 mg total) by mouth daily. 90 tablet 3   Dulaglutide (TRULICITY) 2.42 AS/3.4HD SOPN Inject 1 pen into the skin every Saturday.     glipiZIDE (GLUCOTROL) 5 MG tablet Take 1 tablet (5 mg total) by mouth 2 (two) times daily before a meal. (Patient not taking: Reported on 09/11/2018) 60 tablet 3   No facility-administered medications prior to visit.     Allergies  Allergen Reactions   Amoxicillin Anaphylaxis and Other (See Comments)    Has patient had a PCN reaction causing immediate rash, facial/tongue/throat swelling, SOB or lightheadedness with hypotension: yes Has patient had a PCN reaction causing severe rash involving mucus membranes or skin necrosis: no Has patient had a PCN reaction that required hospitalization yes Has patient had a PCN reaction occurring within the last 10 years: yes If all of the above answers are "NO", then may proceed with Cephalosporin use.   Penicillins Anaphylaxis and Other (See Comments)    Patient was told to list this as an allergy because he is allergic to Amoxicillin Has patient had a PCN reaction causing immediate rash, facial/tongue/throat swelling, SOB or lightheadedness with hypotension: yes for Amoxicillin Has patient had a PCN reaction causing severe rash involving mucus membranes or skin necrosis: no Has patient had a PCN reaction that required hospitalization yes for Amoxicillin Has patient had a PCN reaction occurring within the last 10 years: yes for Amoxicillin If all o   Metformin And Related Other (See Comments)    SEVERE GI UPSET   Other Other (See Comments)    Powder in some gloves - localized itching but not allergic to latex, benadryl usually helps with this reaction    Review of Systems  Constitutional: Negative for chills, fever and malaise/fatigue.  HENT: Positive for ear pain and sinus pain. Negative for congestion, ear discharge, hearing loss and sore  throat.   Respiratory: Negative for cough, sputum production, shortness of breath and wheezing.   Cardiovascular: Negative for chest pain.  Gastrointestinal: Negative for constipation, diarrhea, nausea and vomiting.  All other systems reviewed and are negative.      Objective:    Physical Exam  There were no vitals taken for this visit. Wt Readings from Last 3 Encounters:  07/13/18 (!) 320 lb (145.2 kg)  06/15/18 (!) 320 lb (145.2 kg)  04/17/18 (!) 322 lb 6.4  oz (146.2 kg)    There are no preventive care reminders to display for this patient.  There are no preventive care reminders to display for this patient.   Lab Results  Component Value Date   TSH 2.359 07/11/2014   Lab Results  Component Value Date   WBC 9.4 06/15/2018   HGB 13.3 06/15/2018   HCT 39.0 06/15/2018   MCV 80.8 06/15/2018   PLT 300 06/15/2018   Lab Results  Component Value Date   NA 137 06/15/2018   K 3.7 06/15/2018   CO2 22 06/15/2018   GLUCOSE 363 (H) 06/15/2018   BUN 11 06/15/2018   CREATININE 1.03 06/15/2018   BILITOT 0.7 06/15/2018   ALKPHOS 68 06/15/2018   AST 31 06/15/2018   ALT 34 06/15/2018   PROT 7.3 06/15/2018   ALBUMIN 3.9 06/15/2018   CALCIUM 9.5 06/15/2018   ANIONGAP 13 06/15/2018   GFR 123.05 03/11/2016   Lab Results  Component Value Date   CHOL 214 (H) 04/17/2018   Lab Results  Component Value Date   HDL 30 (L) 04/17/2018   Lab Results  Component Value Date   LDLCALC Comment 04/17/2018   Lab Results  Component Value Date   TRIG 829 ([place]) 04/17/2018   Lab Results  Component Value Date   CHOLHDL 7.1 (H) 04/17/2018   Lab Results  Component Value Date   HGBA1C 11.1 (A) 04/17/2018       Assessment & Plan:   Problem List Items Addressed This Visit    None    Visit Diagnoses    Acute non-recurrent maxillary sinusitis    -  Primary       Meds ordered this encounter  Medications   doxycycline (VIBRA-TABS) 100 MG tablet    Sig: Take 1 tablet (100 mg  total) by mouth 2 (two) times daily for 5 days.    Dispense:  10 tablet    Refill:  0    Order Specific Question:   Supervising Provider    Answer:   Forrest Moron [id number]   Acute non-recurrent maxillary sinusitis - Plan: doxycycline (VIBRA-TABS) 100 MG tablet  PLAN:  Pt likely Acute Bacterial Sinusitis given symptoms and their duration. To be treated with doxycycline 100mg  PO bid for 5 days, given hx of penicillin allergy.   Pt also has recent history of blood sugar elevated to 400s. Has not followed up with PCP Dr. Mitchel Honour as requested by specialists, has not taken T2DM medication in 1+ years due to lack of insurance coverage. We discussed the risk for DKA/HHS with this level of blood sugars and our shared goal to prevent hospitalization. Pt states he will reestablish care with endo and is encouraged to contact myself or his PCP, Dr Mitchel Honour, with any concerns  Pt encouraged to contact clinic with any questions, comments, or concerns.   Thank you for your visit with Primary Care at [facility] today.  Maximiano Coss, NP [facility] at [place], [place] [address] [phone]) [place] NOTE Established Patient  This telephone encounter was conducted with the patient's (or proxy's) verbal consent via audio telecommunications: yes   Patient was instructed to have this encounter in a suitably private space; and to only have persons present to whom they give permission to participate. In addition, patient identity was confirmed by use of name plus two identifiers (DOB and address).  I discussed the limitations, risks, security and privacy concerns of performing  an evaluation and management service by telephone and the availability of in person appointments. I also discussed with the patient that there may be a patient responsible charge related to this service. The patient expressed understanding and agreed to proceed.

## 2018-09-11 NOTE — Patient Instructions (Signed)
Sinusitis, Adult  Sinusitis is inflammation of your sinuses. Sinuses are hollow spaces in the bones around your face. Your sinuses are located:   Around your eyes.   In the middle of your forehead.   Behind your nose.   In your cheekbones.  Mucus normally drains out of your sinuses. When your nasal tissues become inflamed or swollen, mucus can become trapped or blocked. This allows bacteria, viruses, and fungi to grow, which leads to infection. Most infections of the sinuses are caused by a virus.  Sinusitis can develop quickly. It can last for up to 4 weeks (acute) or for more than 12 weeks (chronic). Sinusitis often develops after a cold.  What are the causes?  This condition is caused by anything that creates swelling in the sinuses or stops mucus from draining. This includes:   Allergies.   Asthma.   Infection from bacteria or viruses.   Deformities or blockages in your nose or sinuses.   Abnormal growths in the nose (nasal polyps).   Pollutants, such as chemicals or irritants in the air.   Infection from fungi (rare).  What increases the risk?  You are more likely to develop this condition if you:   Have a weak body defense system (immune system).   Do a lot of swimming or diving.   Overuse nasal sprays.   Smoke.  What are the signs or symptoms?  The main symptoms of this condition are pain and a feeling of pressure around the affected sinuses. Other symptoms include:   Stuffy nose or congestion.   Thick drainage from your nose.   Swelling and warmth over the affected sinuses.   Headache.   Upper toothache.   A cough that may get worse at night.   Extra mucus that collects in the throat or the back of the nose (postnasal drip).   Decreased sense of smell and taste.   Fatigue.   A fever.   Sore throat.   Bad breath.  How is this diagnosed?  This condition is diagnosed based on:   Your symptoms.   Your medical history.   A physical exam.   Tests to find out if your condition is  acute or chronic. This may include:  ? Checking your nose for nasal polyps.  ? Viewing your sinuses using a device that has a light (endoscope).  ? Testing for allergies or bacteria.  ? Imaging tests, such as an MRI or CT scan.  In rare cases, a bone biopsy may be done to rule out more serious types of fungal sinus disease.  How is this treated?  Treatment for sinusitis depends on the cause and whether your condition is chronic or acute.   If caused by a virus, your symptoms should go away on their own within 10 days. You may be given medicines to relieve symptoms. They include:  ? Medicines that shrink swollen nasal passages (topical intranasal decongestants).  ? Medicines that treat allergies (antihistamines).  ? A spray that eases inflammation of the nostrils (topical intranasal corticosteroids).  ? Rinses that help get rid of thick mucus in your nose (nasal saline washes).   If caused by bacteria, your health care provider may recommend waiting to see if your symptoms improve. Most bacterial infections will get better without antibiotic medicine. You may be given antibiotics if you have:  ? A severe infection.  ? A weak immune system.   If caused by narrow nasal passages or nasal polyps, you may need   to have surgery.  Follow these instructions at home:  Medicines   Take, use, or apply over-the-counter and prescription medicines only as told by your health care provider. These may include nasal sprays.   If you were prescribed an antibiotic medicine, take it as told by your health care provider. Do not stop taking the antibiotic even if you start to feel better.  Hydrate and humidify     Drink enough fluid to keep your urine pale yellow. Staying hydrated will help to thin your mucus.   Use a cool mist humidifier to keep the humidity level in your home above 50%.   Inhale steam for 10-15 minutes, 3-4 times a day, or as told by your health care provider. You can do this in the bathroom while a hot shower is  running.   Limit your exposure to cool or dry air.  Rest   Rest as much as possible.   Sleep with your head raised (elevated).   Make sure you get enough sleep each night.  General instructions     Apply a warm, moist washcloth to your face 3-4 times a day or as told by your health care provider. This will help with discomfort.   Wash your hands often with soap and water to reduce your exposure to germs. If soap and water are not available, use hand sanitizer.   Do not smoke. Avoid being around people who are smoking (secondhand smoke).   Keep all follow-up visits as told by your health care provider. This is important.  Contact a health care provider if:   You have a fever.   Your symptoms get worse.   Your symptoms do not improve within 10 days.  Get help right away if:   You have a severe headache.   You have persistent vomiting.   You have severe pain or swelling around your face or eyes.   You have vision problems.   You develop confusion.   Your neck is stiff.   You have trouble breathing.  Summary   Sinusitis is soreness and inflammation of your sinuses. Sinuses are hollow spaces in the bones around your face.   This condition is caused by nasal tissues that become inflamed or swollen. The swelling traps or blocks the flow of mucus. This allows bacteria, viruses, and fungi to grow, which leads to infection.   If you were prescribed an antibiotic medicine, take it as told by your health care provider. Do not stop taking the antibiotic even if you start to feel better.   Keep all follow-up visits as told by your health care provider. This is important.  This information is not intended to replace advice given to you by your health care provider. Make sure you discuss any questions you have with your health care provider.  Document Released: 04/26/2005 Document Revised: 09/26/2017 Document Reviewed: 09/26/2017  [employer]vier Interactive Patient Education  2019 [employer]

## 2018-09-18 ENCOUNTER — Other Ambulatory Visit: Payer: Self-pay

## 2018-09-18 ENCOUNTER — Emergency Department (HOSPITAL_COMMUNITY): Payer: 59

## 2018-09-18 ENCOUNTER — Emergency Department (HOSPITAL_COMMUNITY)
Admission: EM | Admit: 2018-09-18 | Discharge: 2018-09-18 | Disposition: A | Discharge status: Home / Self Care | Payer: 59 | Attending: Emergency Medicine | Admitting: Emergency Medicine

## 2018-09-18 ENCOUNTER — Encounter (HOSPITAL_COMMUNITY): Payer: Self-pay | Admitting: Emergency Medicine

## 2018-09-18 DIAGNOSIS — E119 Type 2 diabetes mellitus without complications: Secondary | ICD-10-CM | POA: Diagnosis not present

## 2018-09-18 DIAGNOSIS — R531 Weakness: Secondary | ICD-10-CM | POA: Diagnosis not present

## 2018-09-18 DIAGNOSIS — R519 Headache, unspecified: Secondary | ICD-10-CM

## 2018-09-18 DIAGNOSIS — I10 Essential (primary) hypertension: Secondary | ICD-10-CM | POA: Diagnosis not present

## 2018-09-18 DIAGNOSIS — Z79899 Other long term (current) drug therapy: Secondary | ICD-10-CM | POA: Diagnosis not present

## 2018-09-18 DIAGNOSIS — R202 Paresthesia of skin: Secondary | ICD-10-CM | POA: Diagnosis not present

## 2018-09-18 DIAGNOSIS — Z7984 Long term (current) use of oral hypoglycemic drugs: Secondary | ICD-10-CM | POA: Diagnosis not present

## 2018-09-18 DIAGNOSIS — Z7982 Long term (current) use of aspirin: Secondary | ICD-10-CM | POA: Insufficient documentation

## 2018-09-18 DIAGNOSIS — R51 Headache: Secondary | ICD-10-CM | POA: Insufficient documentation

## 2018-09-18 DIAGNOSIS — Z87891 Personal history of nicotine dependence: Secondary | ICD-10-CM | POA: Diagnosis not present

## 2018-09-18 LAB — BASIC METABOLIC PANEL
Anion gap: 12 (ref 5–15)
BUN: 13 mg/dL (ref 6–20)
CO2: 24 mmol/L (ref 22–32)
Calcium: 8.9 mg/dL (ref 8.9–10.3)
Chloride: 102 mmol/L (ref 98–111)
Creatinine, Ser: 0.78 mg/dL (ref 0.61–1.24)
GFR calc Af Amer: >60 mL/min (ref >60)
GFR calc non Af Amer: >60 mL/min (ref >60)
Glucose, Bld: 269 mg/dL — ABNORMAL HIGH (ref 70–99)
Potassium: 3.2 mmol/L — ABNORMAL LOW (ref 3.5–5.1)
Sodium: 138 mmol/L (ref 135–145)

## 2018-09-18 LAB — CBC WITH DIFFERENTIAL/PLATELET
Abs Immature Granulocytes: 0.03 10*3/uL (ref 0.00–0.07)
Basophils Absolute: 0 10*3/uL (ref 0.0–0.1)
Basophils Relative: 0 %
Eosinophils Absolute: 0.1 10*3/uL (ref 0.0–0.5)
Eosinophils Relative: 2 %
HCT: 40.6 % (ref 39.0–52.0)
Hemoglobin: 13 g/dL (ref 13.0–17.0)
Immature Granulocytes: 0 %
Lymphocytes Relative: 24 %
Lymphs Abs: 2.1 10*3/uL (ref 0.7–4.0)
MCH: 25.8 pg — ABNORMAL LOW (ref 26.0–34.0)
MCHC: 32 g/dL (ref 30.0–36.0)
MCV: 80.7 fL (ref 80.0–100.0)
Monocytes Absolute: 0.5 10*3/uL (ref 0.1–1.0)
Monocytes Relative: 5 %
Neutro Abs: 6.1 10*3/uL (ref 1.7–7.7)
Neutrophils Relative %: 69 %
Platelets: 295 10*3/uL (ref 150–400)
RBC: 5.03 MIL/uL (ref 4.22–5.81)
RDW: 13.3 % (ref 11.5–15.5)
WBC: 8.9 10*3/uL (ref 4.0–10.5)
nRBC: 0 % (ref 0.0–0.2)

## 2018-09-18 MED ORDER — POTASSIUM CHLORIDE CRYS ER 20 MEQ PO TBCR
40.0000 meq | EXTENDED_RELEASE_TABLET | Freq: Once | ORAL | Status: AC
  Administered 2018-09-18: 40 meq via ORAL
  Filled 2018-09-18: qty 2

## 2018-09-18 MED ORDER — KETOROLAC TROMETHAMINE 15 MG/ML IJ SOLN
15.0000 mg | Freq: Once | INTRAMUSCULAR | Status: AC
  Administered 2018-09-18: 15 mg via INTRAVENOUS
  Filled 2018-09-18: qty 1

## 2018-09-18 MED ORDER — DIPHENHYDRAMINE HCL 50 MG/ML IJ SOLN
25.0000 mg | Freq: Once | INTRAMUSCULAR | Status: AC
  Administered 2018-09-18: 25 mg via INTRAVENOUS
  Filled 2018-09-18: qty 1

## 2018-09-18 MED ORDER — SODIUM CHLORIDE 0.9 % IV BOLUS
1000.0000 mL | Freq: Once | INTRAVENOUS | Status: AC
  Administered 2018-09-18: 1000 mL via INTRAVENOUS

## 2018-09-18 MED ORDER — PROCHLORPERAZINE EDISYLATE 10 MG/2ML IJ SOLN
10.0000 mg | Freq: Once | INTRAMUSCULAR | Status: AC
  Administered 2018-09-18: 10 mg via INTRAVENOUS
  Filled 2018-09-18: qty 2

## 2018-09-18 NOTE — ED Notes (Signed)
Patient transported to MRI 

## 2018-09-18 NOTE — ED Triage Notes (Signed)
Pt reports having intermittent headaches for past month. Pt reports little blurred vision on left side for several days.

## 2018-09-18 NOTE — ED Notes (Signed)
Patient transported to CT 

## 2018-09-18 NOTE — ED Provider Notes (Signed)
[facility] DEPT Provider Note   CSN: [id number] Arrival date & time: 09/18/18  1152    History   Chief Complaint Chief Complaint  Patient presents with  . Headache    HPI [name] is a 38 y.o. male.     HPI  38 year old male presents with headache.  He states he has had this headache for the last month or so.  Started off with a bad tooth that he went to a dentist and then an oral surgeon and had wisdom teeth removed.  However the headaches recurred after this and antibiotics.  Dentist that is probably his sinuses and not his teeth.  PCP put him on doxycycline.  However the headaches are still there.  Today at 10 AM the headache seemed to all of a sudden become severe while he was at his kids daycare.  The headache has always been left-sided, retro-bulbar.  No vomiting but he will feel nauseated.  Has had photophobia.  He is also noticed intermittent blurry/double vision out of the left eye only.  However when this occurs if he blinks or closes eyes and then opens them it will seem to resolve.  He noticed some left hand tingling as well as some mild weakness today with the severe headache.  He is not dropping anything.  No other weakness/numbness.  No neck pain or stiffness.  No fevers.  Past Medical History:  Diagnosis Date  . Allergies   . Anemia   . Anxiety   . Depression   . Diabetes mellitus   . GERD (gastroesophageal reflux disease)   . Hyperlipidemia   . Hypertension   . Lumbar disc herniation   . 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 PTSD (post-traumatic stress disorder)   . Scoliosis     Patient Active Problem List   Diagnosis Date Noted  . Essential hypertension 04/17/2018  . Dyslipidemia 04/17/2018  . PTSD (post-traumatic stress disorder) 08/11/2016  . Spinal stenosis, lumbar region with neurogenic claudication 05/05/2016  . Left epididymitis 03/16/2016  . Dysthymia 09/02/2015  . Chronic low  back pain 09/02/2015  . Uncontrolled type 2 diabetes mellitus without complication ([place]) [id number]/[id number]  . UNSPECIFIED ANEMIA 03/11/2008    Past Surgical History:  Procedure Laterality Date  . LUMBAR LAMINECTOMY/DECOMPRESSION MICRODISCECTOMY Left 05/05/2016   Procedure: CENTRAL DECOMPRESSION L4-L5 AND FORAMINOTOMY FOR L4 ROOT AND L5 ROOT ON THE LEFT;  Surgeon: Latanya Maudlin, MD;  Location: [facility] ORS;  Service: Orthopedics;  Laterality: Left;  . NO PAST SURGERIES          Home Medications    Prior to Admission medications   Medication Sig Start Date End Date Taking? Authorizing Provider  amitriptyline (ELAVIL) 25 MG tablet Take 25-50 mg by mouth daily as needed for sleep. 04/16/18  Yes [provider]  amLODipine (NORVASC) 10 MG tablet Take 1 tablet (10 mg total) by mouth daily. 04/17/18  Yes SagardiaInes Bloomer, MD  aspirin EC 81 MG tablet Take 1 tablet (81 mg total) by mouth daily. 07/11/14  Yes Shawnee Knapp, MD  diphenhydrAMINE (BENADRYL) 25 mg capsule Take 25-50 mg by mouth daily as needed for allergies (DEPENDS ON HOW BAD ALLERGIES IF TAKES 25-50 MG).   Yes [provider]  DULoxetine (CYMBALTA) 20 MG capsule Take 20 mg by mouth at bedtime. 04/21/18  Yes [provider]  lisinopril (PRINIVIL,ZESTRIL) 10 MG tablet Take 1 tablet (10 mg total) by mouth daily. 04/17/18  Yes Horald Pollen, MD  rosuvastatin (CRESTOR) 20 MG tablet Take 1 tablet (20 mg total) by mouth daily. 04/17/18  Yes Sagardia, Ines Bloomer, MD  Dulaglutide (TRULICITY) 3.50 KX/3.8HW SOPN Inject 1 pen into the skin every Saturday.    [provider]  glipiZIDE (GLUCOTROL) 5 MG tablet Take 1 tablet (5 mg total) by mouth 2 (two) times daily before a meal. Patient not taking: Reported on 09/11/2018 04/17/18   Horald Pollen, MD  ibuprofen (ADVIL,MOTRIN) 200 MG tablet Take 800 mg by mouth every 6 (six) hours as needed for mild pain.    [provider]    Family History Family  History  Problem Relation Age of Onset  . Stroke Maternal Grandmother     Social History Social History   Tobacco Use  . Smoking status: Former Smoker    Types: Cigars  . Smokeless tobacco: Current User    Types: Snuff, Chew  Substance Use Topics  . Alcohol use: Yes    Alcohol/week: 5.0 standard drinks    Types: 5 Shots of liquor per week    Comment: few times year  . Drug use: No     Allergies   Amoxicillin; Penicillins; Metformin and related; and Other   Review of Systems Review of Systems  Constitutional: Negative for fever.  Eyes: Positive for photophobia and visual disturbance.  Gastrointestinal: Positive for nausea. Negative for vomiting.  Musculoskeletal: Negative for neck pain and neck stiffness.  Neurological: Positive for weakness, numbness and headaches.  All other systems reviewed and are negative.    Physical Exam Updated Vital Signs BP 117/65   Pulse 87   Temp 98.7 F (37.1 C) (Oral)   Resp 15   Ht 6\' 3"  (1.905 m)   Wt (!) 145.2 kg   SpO2 98%   BMI 40.00 kg/m   Physical Exam Vitals signs and nursing note reviewed.  Constitutional:      General: He is not in acute distress.    Appearance: He is well-developed. He is obese. He is not ill-appearing or diaphoretic.  HENT:     Head: Normocephalic and atraumatic.     Right Ear: External ear normal.     Left Ear: External ear normal.     Nose: Nose normal.     Mouth/Throat:     Comments: Teeth grossly appear well without signs of acute infection or swelling Eyes:     General:        Right eye: No discharge.        Left eye: No discharge.     Extraocular Movements: Extraocular movements intact.     Pupils: Pupils are equal, round, and reactive to light.  Neck:     Musculoskeletal: Normal range of motion and neck supple. No neck rigidity.  Cardiovascular:     Rate and Rhythm: Normal rate and regular rhythm.     Heart sounds: Normal heart sounds.  Pulmonary:     Effort: Pulmonary effort is  normal.     Breath sounds: Normal breath sounds.  Abdominal:     Palpations: Abdomen is soft.     Tenderness: There is no abdominal tenderness.  Skin:    General: Skin is warm and dry.  Neurological:     Mental Status: He is alert.     Comments: CN 3-12 grossly intact. 5/5 strength in all 4 extremities, including left hand. No pronator drift. Grossly normal sensation. Normal finger to nose.   Psychiatric:  Mood and Affect: Mood is not anxious.      ED Treatments / Results  Labs (all labs ordered are listed, but only abnormal results are displayed) Labs Reviewed  BASIC METABOLIC PANEL - Abnormal; Notable for the following components:      Result Value   Potassium 3.2 (*)    Glucose, Bld 269 (*)    All other components within normal limits  CBC WITH DIFFERENTIAL/PLATELET - Abnormal; Notable for the following components:   MCH 25.8 (*)    All other components within normal limits    EKG None  Radiology Ct Head Wo Contrast  Result Date: 09/18/2018 CLINICAL DATA:  Headache, blurry vision EXAM: CT HEAD WITHOUT CONTRAST TECHNIQUE: Contiguous axial images were obtained from the base of the skull through the vertex without intravenous contrast. COMPARISON:  None. FINDINGS: Brain: No evidence of acute infarction, hemorrhage, hydrocephalus, extra-axial collection or mass lesion/mass effect. Vascular: No hyperdense vessel or unexpected calcification. Skull: Normal. Negative for fracture or focal lesion. Sinuses/Orbits: No acute finding. Other: None. IMPRESSION: No acute intracranial pathology. No non-contrast CT findings to explain headache. Electronically Signed   By: Eddie Candle M.D.   On: 09/18/2018 14:51   Mr Brain Wo Contrast  Result Date: 09/18/2018 CLINICAL DATA:  Intermittent headaches over the last month. Blurred vision on the left over the last several days. EXAM: MRI HEAD WITHOUT  CONTRAST MRV HEAD WITHOUT CONTRAST TECHNIQUE: Multiplanar, multiecho pulse sequences of the  brain and surrounding structures were obtained without and with intravenous contrast. Angiographic images of the intracranial venous structures were obtained using MRV technique without intravenous contrast. COMPARISON:  Head CT same day FINDINGS: MRI head: Brain: The brain has a normal appearance without evidence of malformation, atrophy, old or acute small or large vessel infarction, mass lesion, hemorrhage, hydrocephalus or extra-axial collection. Vascular: Major vessels at the base of the brain show flow. Venous sinuses appear patent. Skull and upper cervical spine: Normal. Sinuses/Orbits: Clear/normal. Other: None significant. MR venography: Major sinuses are patent. Deep veins are patent. No superficial thrombosis is identified. IMPRESSION: Normal MRI of the brain. Normal intracranial MR venography. Electronically Signed   By: Nelson Chimes M.D.   On: 09/18/2018 16:05   Mr Mrv Head Wo Cm  Result Date: 09/18/2018 CLINICAL DATA:  Intermittent headaches over the last month. Blurred vision on the left over the last several days. EXAM: MRI HEAD WITHOUT  CONTRAST MRV HEAD WITHOUT CONTRAST TECHNIQUE: Multiplanar, multiecho pulse sequences of the brain and surrounding structures were obtained without and with intravenous contrast. Angiographic images of the intracranial venous structures were obtained using MRV technique without intravenous contrast. COMPARISON:  Head CT same day FINDINGS: MRI head: Brain: The brain has a normal appearance without evidence of malformation, atrophy, old or acute small or large vessel infarction, mass lesion, hemorrhage, hydrocephalus or extra-axial collection. Vascular: Major vessels at the base of the brain show flow. Venous sinuses appear patent. Skull and upper cervical spine: Normal. Sinuses/Orbits: Clear/normal. Other: None significant. MR venography: Major sinuses are patent. Deep veins are patent. No superficial thrombosis is identified. IMPRESSION: Normal MRI of the brain.  Normal intracranial MR venography. Electronically Signed   By: Nelson Chimes M.D.   On: 09/18/2018 16:05    Procedures Procedures (including critical care time)  Medications Ordered in ED Medications  ketorolac (TORADOL) 15 MG/ML injection 15 mg (has no administration in time range)  sodium chloride 0.9 % bolus 1,000 mL (0 mLs Intravenous Stopped 09/18/18 1446)  prochlorperazine (COMPAZINE) injection  10 mg (10 mg Intravenous Given 09/18/18 1310)  diphenhydrAMINE (BENADRYL) injection 25 mg (25 mg Intravenous Given 09/18/18 1309)  potassium chloride SA (K-DUR) CR tablet 40 mEq (40 mEq Oral Given 09/18/18 1423)     Initial Impression / Assessment and Plan / ED Course  I have reviewed the triage vital signs and the nursing notes.  Pertinent labs & imaging results that were available during my care of the patient were reviewed by me and considered in my medical decision making (see chart for details).        Patient's left-sided headache is significantly improved with Compazine and Benadryl.  He was given IV fluids.  Given intermittent visual complaints, MRI MRV obtained to help rule out venous sinus thrombosis or other acute CNS cause.  Given the length of time with no fever or elevated WBC or meningismus I highly doubt CNS infection.  Probably atypical migraine.  He does report some vague hand weakness and numbness but this is not apparent on exam.  With the negative findings I doubt acute stroke.  His CT head was obtained within a couple hours of his sudden onset headache this morning so I think missed subarachnoid hemorrhage is pretty unlikely.  At this point is feeling better and I think it is reasonable to discharge him home with return precautions and outpatient neuro follow-up.  Final Clinical Impressions(s) / ED Diagnoses   Final diagnoses:  Left-sided headache    ED Discharge Orders         Ordered    Ambulatory referral to Neurology    Comments:  An appointment is requested in  approximately: 2 weeks   09/18/18 1646           Sherwood Gambler, MD 09/18/18 1654

## 2018-09-18 NOTE — Discharge Instructions (Addendum)
If you develop continued, recurrent, or worsening headache, fever, neck stiffness, vomiting, blurry or double vision, weakness or numbness in your arms or legs, trouble speaking, or any other new/concerning symptoms then return to the ER for evaluation.  

## 2018-09-28 ENCOUNTER — Other Ambulatory Visit: Payer: Self-pay

## 2018-09-28 ENCOUNTER — Encounter (HOSPITAL_COMMUNITY): Payer: Self-pay | Admitting: Emergency Medicine

## 2018-09-28 ENCOUNTER — Emergency Department (HOSPITAL_COMMUNITY)
Admission: EM | Admit: 2018-09-28 | Discharge: 2018-09-29 | Disposition: A | Discharge status: Home / Self Care | Payer: 59 | Attending: Emergency Medicine | Admitting: Emergency Medicine

## 2018-09-28 DIAGNOSIS — Z7982 Long term (current) use of aspirin: Secondary | ICD-10-CM | POA: Insufficient documentation

## 2018-09-28 DIAGNOSIS — E1165 Type 2 diabetes mellitus with hyperglycemia: Secondary | ICD-10-CM | POA: Insufficient documentation

## 2018-09-28 DIAGNOSIS — E1140 Type 2 diabetes mellitus with diabetic neuropathy, unspecified: Secondary | ICD-10-CM | POA: Insufficient documentation

## 2018-09-28 DIAGNOSIS — R739 Hyperglycemia, unspecified: Secondary | ICD-10-CM

## 2018-09-28 DIAGNOSIS — I10 Essential (primary) hypertension: Secondary | ICD-10-CM | POA: Insufficient documentation

## 2018-09-28 DIAGNOSIS — Z79899 Other long term (current) drug therapy: Secondary | ICD-10-CM | POA: Insufficient documentation

## 2018-09-28 DIAGNOSIS — R319 Hematuria, unspecified: Secondary | ICD-10-CM | POA: Diagnosis present

## 2018-09-28 DIAGNOSIS — N3001 Acute cystitis with hematuria: Secondary | ICD-10-CM | POA: Diagnosis not present

## 2018-09-28 DIAGNOSIS — Z87891 Personal history of nicotine dependence: Secondary | ICD-10-CM | POA: Insufficient documentation

## 2018-09-28 LAB — URINALYSIS, MICROSCOPIC (REFLEX)
Bacteria, UA: NONE SEEN
RBC / HPF: >50 RBC/hpf (ref 0–5)
Squamous Epithelial / HPF: NONE SEEN (ref 0–5)

## 2018-09-28 LAB — URINALYSIS, ROUTINE W REFLEX MICROSCOPIC
Bilirubin Urine: NEGATIVE
Glucose, UA: >=500 mg/dL — AB
Ketones, ur: 20 mg/dL — AB
Nitrite: POSITIVE — AB
Protein, ur: 100 mg/dL — AB
Specific Gravity, Urine: 1.024 (ref 1.005–1.030)
pH: 6 (ref 5.0–8.0)

## 2018-09-28 NOTE — ED Triage Notes (Signed)
Patient c/o hematuria with clots since 2130. Denies pain. Reports urinary frequency.

## 2018-09-29 ENCOUNTER — Emergency Department (HOSPITAL_COMMUNITY): Payer: 59

## 2018-09-29 LAB — COMPREHENSIVE METABOLIC PANEL
ALT: 32 U/L (ref 0–44)
AST: 26 U/L (ref 15–41)
Albumin: 4.1 g/dL (ref 3.5–5.0)
Alkaline Phosphatase: 80 U/L (ref 38–126)
Anion gap: 10 (ref 5–15)
BUN: 12 mg/dL (ref 6–20)
CO2: 27 mmol/L (ref 22–32)
Calcium: 9.2 mg/dL (ref 8.9–10.3)
Chloride: 101 mmol/L (ref 98–111)
Creatinine, Ser: 0.8 mg/dL (ref 0.61–1.24)
GFR calc Af Amer: >60 mL/min (ref >60)
GFR calc non Af Amer: >60 mL/min (ref >60)
Glucose, Bld: 285 mg/dL — ABNORMAL HIGH (ref 70–99)
Potassium: 3.7 mmol/L (ref 3.5–5.1)
Sodium: 138 mmol/L (ref 135–145)
Total Bilirubin: 0.7 mg/dL (ref 0.3–1.2)
Total Protein: 7.6 g/dL (ref 6.5–8.1)

## 2018-09-29 LAB — CBC WITH DIFFERENTIAL/PLATELET
Abs Immature Granulocytes: 0.03 10*3/uL (ref 0.00–0.07)
Basophils Absolute: 0 10*3/uL (ref 0.0–0.1)
Basophils Relative: 0 %
Eosinophils Absolute: 0.1 10*3/uL (ref 0.0–0.5)
Eosinophils Relative: 1 %
HCT: 41.4 % (ref 39.0–52.0)
Hemoglobin: 13.4 g/dL (ref 13.0–17.0)
Immature Granulocytes: 0 %
Lymphocytes Relative: 23 %
Lymphs Abs: 2.7 10*3/uL (ref 0.7–4.0)
MCH: 26.3 pg (ref 26.0–34.0)
MCHC: 32.4 g/dL (ref 30.0–36.0)
MCV: 81.3 fL (ref 80.0–100.0)
Monocytes Absolute: 0.6 10*3/uL (ref 0.1–1.0)
Monocytes Relative: 5 %
Neutro Abs: 8.3 10*3/uL — ABNORMAL HIGH (ref 1.7–7.7)
Neutrophils Relative %: 71 %
Platelets: 306 10*3/uL (ref 150–400)
RBC: 5.09 MIL/uL (ref 4.22–5.81)
RDW: 13.4 % (ref 11.5–15.5)
WBC: 11.8 10*3/uL — ABNORMAL HIGH (ref 4.0–10.5)
nRBC: 0 % (ref 0.0–0.2)

## 2018-09-29 MED ORDER — HALOPERIDOL LACTATE 5 MG/ML IJ SOLN: 5.0000 mg | Freq: Once | INTRAMUSCULAR | Status: DC

## 2018-09-29 MED ORDER — CIPROFLOXACIN HCL 500 MG PO TABS: 500.0000 mg | ORAL_TABLET | Freq: Two times a day (BID) | ORAL | 0 refills | Status: AC

## 2018-09-29 MED ORDER — ONDANSETRON 4 MG PO TBDP: 4.0000 mg | ORAL_TABLET | Freq: Three times a day (TID) | ORAL | 0 refills | Status: DC | PRN

## 2018-09-29 NOTE — Discharge Instructions (Addendum)
Thank you for allowing me to care for you today in the Emergency Department.   Take 1 tablet of ciprofloxacin by mouth 2 times daily for the next 5 days.   Let 1 tablet of Zofran dissolve in your tongue every 8 hours as needed for nausea or vomiting.  Call to schedule follow-up with your primary care provider for a recheck to make sure that your symptoms are resolving.  Please call to schedule a follow-up appointment with your endocrinologist.  You can take 650 mg of Tylenol once every 6 hours for pain control.  Return to the emergency department if you develop worsening blood in your urine, particularly after being on antibiotics for 48 hours, high fever, persistent vomiting despite taking Zofran, if you become unable to urinate, or develop other new, concerning symptoms.

## 2018-09-29 NOTE — ED Notes (Signed)
Patient transported to CT 

## 2018-09-29 NOTE — ED Notes (Signed)
Pt verbalized discharge instructions and follow up care. Alert and ambulatory  

## 2018-09-29 NOTE — ED Provider Notes (Signed)
[facility] DEPT Provider Note   CSN: [id number] Arrival date & time: 09/28/18  2158    History   Chief Complaint Chief Complaint  Patient presents with  . Hematuria    HPI [name] is a 38 y.o. male with history of diabetic neuropathy, diabetes mellitus type 2, GERD, HLD, HTN, and PTSD who presents to the emergency department with a chief complaint of hematuria.  The patient endorses gross hematuria, urinary frequency, and urinary hesitancy, onset tonight.  He is also endorsing nausea.  He denies associated dysuria, abdominal pain, back pain, flank pain, vomiting, diarrhea, constipation, penile or testicular pain or swelling.  No known aggravating or alleviating factors.  He reports that he has a history of diabetes mellitus, but is not currently taking any medications.  States that he previously had had UTIs secondary to 1 of his previous diabetes medications, but the infections resolved after his medication was changed.  No history of hematuria.  No history of nephrolithiasis or renal or bladder cancer.  No treatment prior to arrival.     The history is provided by the patient. No language interpreter was used.    Past Medical History:  Diagnosis Date  . Allergies   . Anemia   . Anxiety   . Depression   . Diabetes mellitus   . GERD (gastroesophageal reflux disease)   . Hyperlipidemia   . Hypertension   . Lumbar disc herniation   . Neuromuscular disorder ([place])    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   . PTSD (post-traumatic stress disorder)   . Scoliosis     Patient Active Problem List   Diagnosis Date Noted  . Essential hypertension 04/17/2018  . Dyslipidemia 04/17/2018  . PTSD (post-traumatic stress disorder) 08/11/2016  . Spinal stenosis, lumbar region with neurogenic claudication 05/05/2016  . Left epididymitis 03/16/2016  . Dysthymia 09/02/2015  . Chronic low back pain 09/02/2015  . Uncontrolled type 2 diabetes mellitus  without complication ([place]) 03/06/2535  . UNSPECIFIED ANEMIA 03/11/2008    Past Surgical History:  Procedure Laterality Date  . LUMBAR LAMINECTOMY/DECOMPRESSION MICRODISCECTOMY Left 05/05/2016   Procedure: CENTRAL DECOMPRESSION L4-L5 AND FORAMINOTOMY FOR L4 ROOT AND L5 ROOT ON THE LEFT;  Surgeon: Latanya Maudlin, MD;  Location: [facility];  Service: Orthopedics;  Laterality: Left;  . NO PAST SURGERIES          Home Medications    Prior to Admission medications   Medication Sig Start Date End Date Taking? Authorizing Provider  acetaminophen (TYLENOL) 500 MG tablet Take 1,000 mg by mouth every 6 (six) hours as needed for mild pain.   Yes [provider]  amitriptyline (ELAVIL) 25 MG tablet Take 25-50 mg by mouth daily as needed for sleep. 04/16/18  Yes [provider]  amLODipine (NORVASC) 10 MG tablet Take 1 tablet (10 mg total) by mouth daily. 04/17/18  Yes SagardiaInes Bloomer, MD  aspirin EC 81 MG tablet Take 1 tablet (81 mg total) by mouth daily. 07/11/14  Yes Shawnee Knapp, MD  diphenhydrAMINE (BENADRYL) 25 mg capsule Take 25-50 mg by mouth daily as needed for allergies (DEPENDS ON HOW BAD ALLERGIES IF TAKES 25-50 MG).   Yes [provider]  DULoxetine (CYMBALTA) 20 MG capsule Take 20 mg by mouth at bedtime. 04/21/18  Yes [provider]  ibuprofen (ADVIL,MOTRIN) 200 MG tablet Take 800 mg by mouth every 6 (six) hours as needed for mild pain.   Yes [provider]  lisinopril (PRINIVIL,ZESTRIL) 10  MG tablet Take 1 tablet (10 mg total) by mouth daily. 04/17/18  Yes Sagardia, Ines Bloomer, MD  rosuvastatin (CRESTOR) 20 MG tablet Take 1 tablet (20 mg total) by mouth daily. 04/17/18  Yes Sagardia, Ines Bloomer, MD  ciprofloxacin (CIPRO) 500 MG tablet Take 1 tablet (500 mg total) by mouth every 12 (twelve) hours for 5 days. 09/29/18 10/04/18  Deziree Mokry A, PA-C  Dulaglutide (TRULICITY) 1.09 NA/3.5TD SOPN Inject 1 pen into the skin every Saturday.    [provider]  ondansetron (ZOFRAN ODT) 4 MG disintegrating tablet Take 1 tablet (4 mg total) by mouth every 8 (eight) hours as needed. 09/29/18   Dakotah Orrego A, PA-C    Family History Family History  Problem Relation Age of Onset  . Stroke Maternal Grandmother     Social History Social History   Tobacco Use  . Smoking status: Former Smoker    Types: Cigars  . Smokeless tobacco: Current User    Types: Snuff, Chew  Substance Use Topics  . Alcohol use: Yes    Alcohol/week: 5.0 standard drinks    Types: 5 Shots of liquor per week    Comment: few times year  . Drug use: No     Allergies   Amoxicillin; Penicillins; Metformin and related; and Other   Review of Systems Review of Systems  Constitutional: Negative for appetite change, chills and fever.  HENT: Negative for congestion, sinus pressure and sinus pain.   Respiratory: Negative for shortness of breath.   Cardiovascular: Negative for chest pain.  Gastrointestinal: Positive for nausea. Negative for abdominal pain, anal bleeding, blood in stool, constipation, diarrhea and vomiting.  Genitourinary: Positive for frequency, hematuria and urgency. Negative for dysuria, flank pain, penile pain, penile swelling, scrotal swelling and testicular pain.  Musculoskeletal: Negative for back pain.  Skin: Negative for rash.  Allergic/Immunologic: Negative for immunocompromised state.  Neurological: Negative for seizures, weakness and headaches.  Psychiatric/Behavioral: Negative for confusion.     Physical Exam Updated Vital Signs BP (!) 148/98   Pulse (!) 103   Temp 98.9 F (37.2 C) (Oral)   Resp 17   SpO2 97%   Physical Exam Vitals signs and nursing note reviewed.  Constitutional:      General: He is not in acute distress.    Appearance: He is well-developed. He is obese. He is not ill-appearing, toxic-appearing or diaphoretic.  HENT:     Head: Normocephalic.  Eyes:     Conjunctiva/sclera: Conjunctivae normal.   Neck:     Musculoskeletal: Neck supple.  Cardiovascular:     Rate and Rhythm: Regular rhythm. Tachycardia present.     Pulses: Normal pulses.     Heart sounds: Normal heart sounds. No murmur. No friction rub. No gallop.   Pulmonary:     Effort: Pulmonary effort is normal. No respiratory distress.     Breath sounds: No stridor. No wheezing, rhonchi or rales.  Abdominal:     General: There is no distension.     Palpations: Abdomen is soft. There is no mass.     Tenderness: There is no abdominal tenderness. There is no right CVA tenderness, left CVA tenderness, guarding or rebound.     Hernia: No hernia is present.     Comments: Abdomen is obese, but not distended.  Abdomen is soft.  No tenderness throughout the abdomen.  No CVA tenderness bilaterally.  Skin:    General: Skin is warm and dry.  Neurological:     Mental Status: He  is alert.  Psychiatric:        Behavior: Behavior normal.      ED Treatments / Results  Labs (all labs ordered are listed, but only abnormal results are displayed) Labs Reviewed  URINALYSIS, ROUTINE W REFLEX MICROSCOPIC - Abnormal; Notable for the following components:      Result Value   Color, Urine RED (*)    APPearance CLOUDY (*)    Glucose, UA >=500 (*)    Hgb urine dipstick LARGE (*)    Ketones, ur 20 (*)    Protein, ur 100 (*)    Nitrite POSITIVE (*)    Leukocytes,Ua MODERATE (*)    All other components within normal limits  CBC WITH DIFFERENTIAL/PLATELET - Abnormal; Notable for the following components:   WBC 11.8 (*)    Neutro Abs 8.3 (*)    All other components within normal limits  COMPREHENSIVE METABOLIC PANEL - Abnormal; Notable for the following components:   Glucose, Bld 285 (*)    All other components within normal limits  URINE CULTURE  URINALYSIS, MICROSCOPIC (REFLEX)    EKG None  Radiology Ct Renal Stone Study  Result Date: 09/29/2018 CLINICAL DATA:  38 y/o M; gross hematuria, urinary frequency, leukocytosis. EXAM: CT  ABDOMEN AND PELVIS WITHOUT CONTRAST TECHNIQUE: Multidetector CT imaging of the abdomen and pelvis was performed following the standard protocol without IV contrast. COMPARISON:  06/15/2018 and 03/24/2012 CT abdomen and pelvis. FINDINGS: Lower chest: Stable tiny calcified granuloma in the right lower lobe. Hepatobiliary: Hepatomegaly and hepatic steatosis. No focal liver abnormality is seen. No gallstones, gallbladder wall thickening, or biliary dilatation. Pancreas: Unremarkable. No pancreatic ductal dilatation or surrounding inflammatory changes. Spleen: Normal in size without focal abnormality. Adrenals/Urinary Tract: Adrenal glands are unremarkable. Stable 12 mm left adrenal nodule, likely adenoma. Normal right adrenal gland. Calculi, focal lesion, or hydronephrosis. Bladder is unremarkable. Stomach/Bowel: Stomach is within normal limits. Appendix appears normal. No evidence of bowel wall thickening, distention, or inflammatory changes. Vascular/Lymphatic: No significant vascular findings are present. No enlarged abdominal or pelvic lymph nodes. Reproductive: Prostate is unremarkable. Other: No abdominal wall hernia or abnormality. No abdominopelvic ascites. Musculoskeletal: No fracture is seen. Stable transitional lumbosacral anatomy. Chronic laminectomy postsurgical changes at the L4-5 level. Lumbar spondylosis predominantly at L3-4 and L4-5 where there are large calcified discs. IMPRESSION: 1. No acute process identified. No urinary stone disease or obstructive uropathy. 2. Hepatomegaly and hepatic steatosis. 3. Stable 12 mm left adrenal nodule, likely adenoma. Electronically Signed   By: Kristine Garbe M.D.   On: 09/29/2018 02:55    Procedures Procedures (including critical care time)  Medications Ordered in ED Medications - No data to display   Initial Impression / Assessment and Plan / ED Course  I have reviewed the triage vital signs and the nursing notes.  Pertinent labs & imaging  results that were available during my care of the patient were reviewed by me and considered in my medical decision making (see chart for details).        38 year old male with a history of diabetic neuropathy, diabetes mellitus type 2, GERD, HLD, HTN, and PTSD presenting with hematuria, urinary frequency, urinary hesitancy, and nausea, onset tonight.  He is having no associated pain, including dysuria, flank pain, abdominal pain, or back pain.  He is currently off all of his diabetes medications.  He is afebrile.  Tachycardic in the 110s to 120s.  He is mildly hypertensive, but has no hypoxia or tachypnea.  UA with large hemoglobinuria  and many RBCs.  Urine also is concerning for infection.  Urine culture sent.  Will check basic labs, bladder scan, and CT stone study.  He is having no testicular or penile pain to suggest epididymitis or prostatitis.  The patient was discussed with Dr. Clydell Hakim, attending physician.   Labs are notable for mild leukocytosis of 11.8 with elevated neutrophil count of 8.3.  UA with significant glucosuria, large hemoglobinuria, mild ketonuria, moderate leukocyte esterase, nitrite positive, and mild proteinuria.  Urine culture sent since urine looks infectious.  Given concern that the patient is having painless hematuria, CT stone study was obtained which did not show any obstructive uropathy, bladder masses, or urinary stone disease.  There was a stable 12 mm left adrenal nodule, that is likely an adenoma, and this finding was discussed with the patient.  On reevaluation, patient's tachycardia had improved and the patient's heart rate was in the 90s on the monitor without treatment.  He remained afebrile.  The patient has an anaphylactic allergy to amoxicillin and penicillin so the patient will be discharged with ciprofloxacin and Zofran.  No previous urine cultures are available for review.  We also discussed at length restarting the patient on his diabetes  medications.  He reports that he could not tolerate metformin due to GI side effects.  Reports that he had a difficult time injecting himself with Trulicity.  He reports that he had good success with canagliflozin but I discussed that I do not feel comfortable starting the patient on an SGLT-2 medication, which excretes glucose in the urine, while the patient has an active infection.  I have advised him to call his endocrinologist this week for follow-up appointment.  He is hyperglycemic today, but has no evidence of DKA or HHS.  At this time, the patient is hemodynamically stable and in no acute distress.  All questions answered.  Return precautions to the ER given.  Safe for discharge to home with outpatient follow-up.   Final Clinical Impressions(s) / ED Diagnoses   Final diagnoses:  Acute cystitis with hematuria  Hyperglycemia    ED Discharge Orders         Ordered    ciprofloxacin (CIPRO) 500 MG tablet  Every 12 hours     09/29/18 0323    ondansetron (ZOFRAN ODT) 4 MG disintegrating tablet  Every 8 hours PRN     09/29/18 0323           Joline Maxcy A, PA-C 09/29/18 [id number]    Palumbo, April, MD 09/30/18 [id number]

## 2018-09-29 NOTE — ED Notes (Signed)
Lab was called and asked to add culture onto previous urine sample.

## 2018-09-30 LAB — URINE CULTURE: Culture: 90000 — AB

## 2018-10-01 ENCOUNTER — Telehealth: Payer: Self-pay | Admitting: Emergency Medicine

## 2018-10-01 NOTE — Telephone Encounter (Signed)
Post ED Visit - Positive Culture Follow-up  Culture report reviewed by antimicrobial stewardship pharmacist: [facility] Team []  Elenor Quinones, Pharm.D. []  Heide Guile, Pharm.D., BCPS AQ-ID []  Parks Neptune, Pharm.D., BCPS []  Alycia Rossetti, Pharm.D., BCPS []  [place], [place].D., BCPS, AAHIVP []  Legrand Como, Pharm.D., BCPS, AAHIVP []  Salome Arnt, PharmD, BCPS []  Johnnette Gourd, PharmD, BCPS []  Hughes Better, PharmD, BCPS []  Leeroy Cha, PharmD []  Laqueta Linden, PharmD, BCPS []  Albertina Parr, PharmD  [facility] []  Leodis Sias, PharmD []  Lindell Spar, PharmD []  Royetta Asal, PharmD []  Graylin Shiver, Rph []  Rema Fendt) Glennon Mac, PharmD []  Arlyn Dunning, PharmD []  Netta Cedars, PharmD []  Dia Sitter, PharmD []  Leone Haven, PharmD []  Gretta Arab, PharmD [x]  Theodis Shove, PharmD []  Peggyann Juba, PharmD []  Reuel Boom, PharmD   Positive urine culture Treated with Ciprofloxacin, organism sensitive to the same and no further patient follow-up is required at this time.  [name] 10/01/2018, 2:02 PM

## 2018-10-20 ENCOUNTER — Ambulatory Visit: Payer: Self-pay | Admitting: Emergency Medicine

## 2018-10-20 NOTE — Telephone Encounter (Signed)
I work for [employer].   My father works as a Banker in the court house that is closed due to an COVID-19 exposure.   Not having any symptoms.     My job is requiring I be tested. Lives with father.   As of yesterday his father did not have symptoms.  I have sent these notes to Dr. Mitchel Honour for further disposition.    Reason for Disposition . [1] [id number] infection suspected by caller or triager AND [2] mild symptoms (cough, fever, or others) AND [3] no complications or SOB    No symptoms but job requiring he be tested.  Answer Assessment - Initial Assessment Questions 1. COVID-19 DIAGNOSIS: "Who made your Coronavirus (COVID-19) diagnosis?" "Was it confirmed by a positive lab test?" If not diagnosed by a HCP, ask "Are there lots of cases (community spread) where you live?" (See public health department website, if unsure)     My father works at the [employer] as a bailiff.   They have closed the [employer] as a result of a COVID-19 exposure.   I live with my father.   He is not having any symptoms and neither do I but my job is requiring I get tested.   I work as a Engineer, manufacturing. 2. ONSET: "When did the COVID-19 symptoms start?"      None 3. WORST SYMPTOM: "What is your worst symptom?" (e.g., cough, fever, shortness of breath, muscle aches)     None 4. COUGH: "Do you have a cough?" If so, ask: "How bad is the cough?"       No 5. FEVER: "Do you have a fever?" If so, ask: "What is your temperature, how was it measured, and when did it start?"     No 6. RESPIRATORY STATUS: "Describe your breathing?" (e.g., shortness of breath, wheezing, unable to speak)      Fine 7. BETTER-SAME-WORSE: "Are you getting better, staying the same or getting worse compared to yesterday?"  If getting worse, ask, "In what way?"     N/A 8. HIGH RISK DISEASE: "Do you have any chronic medical problems?" (e.g., asthma, heart or lung disease, weak immune system, etc.)     *No Answer* 9. PREGNANCY: "Is there  any chance you are pregnant?" "When was your last menstrual period?"     N/A 10. OTHER SYMPTOMS: "Do you have any other symptoms?"  (e.g., chills, fatigue, headache, loss of smell or taste, muscle pain, sore throat)       No symptoms  Job requiring him be tested.  Protocols used: CORONAVIRUS (COVID-19) DIAGNOSED OR SUSPECTED-A-AH

## 2018-10-23 ENCOUNTER — Telehealth: Payer: Self-pay | Admitting: *Deleted

## 2018-10-23 NOTE — Telephone Encounter (Signed)
See phone message in chart.

## 2018-10-23 NOTE — Telephone Encounter (Signed)
Called patient because of message in [place] states patient works for [employer]. Father is a bailiff was exposed to [place] the cour[place] and test is pending. The patient employer is requiring him to be tested even though he does not have symptoms. Patient states he lives with his father. I advise patient Dr Mitchel Honour will be back on Monday. Contact the office if any symptoms occurs.

## 2019-01-15 ENCOUNTER — Other Ambulatory Visit: Payer: Self-pay

## 2019-01-15 ENCOUNTER — Observation Stay (HOSPITAL_COMMUNITY)
Admission: EM | Admit: 2019-01-15 | Discharge: 2019-01-16 | Disposition: A | Discharge status: Home / Self Care | Payer: 59 | Attending: Internal Medicine | Admitting: Internal Medicine

## 2019-01-15 ENCOUNTER — Emergency Department (HOSPITAL_COMMUNITY): Payer: 59

## 2019-01-15 ENCOUNTER — Encounter (HOSPITAL_COMMUNITY): Payer: Self-pay

## 2019-01-15 DIAGNOSIS — Z9114 Patient's other noncompliance with medication regimen: Secondary | ICD-10-CM | POA: Diagnosis not present

## 2019-01-15 DIAGNOSIS — E785 Hyperlipidemia, unspecified: Secondary | ICD-10-CM | POA: Insufficient documentation

## 2019-01-15 DIAGNOSIS — F419 Anxiety disorder, unspecified: Secondary | ICD-10-CM | POA: Diagnosis not present

## 2019-01-15 DIAGNOSIS — F329 Major depressive disorder, single episode, unspecified: Secondary | ICD-10-CM | POA: Diagnosis not present

## 2019-01-15 DIAGNOSIS — Z79899 Other long term (current) drug therapy: Secondary | ICD-10-CM | POA: Diagnosis not present

## 2019-01-15 DIAGNOSIS — Z87891 Personal history of nicotine dependence: Secondary | ICD-10-CM | POA: Diagnosis not present

## 2019-01-15 DIAGNOSIS — K219 Gastro-esophageal reflux disease without esophagitis: Secondary | ICD-10-CM | POA: Diagnosis not present

## 2019-01-15 DIAGNOSIS — I10 Essential (primary) hypertension: Secondary | ICD-10-CM | POA: Diagnosis not present

## 2019-01-15 DIAGNOSIS — R079 Chest pain, unspecified: Principal | ICD-10-CM | POA: Insufficient documentation

## 2019-01-15 DIAGNOSIS — I160 Hypertensive urgency: Secondary | ICD-10-CM | POA: Insufficient documentation

## 2019-01-15 DIAGNOSIS — Z7982 Long term (current) use of aspirin: Secondary | ICD-10-CM | POA: Diagnosis not present

## 2019-01-15 DIAGNOSIS — E1169 Type 2 diabetes mellitus with other specified complication: Secondary | ICD-10-CM | POA: Diagnosis present

## 2019-01-15 DIAGNOSIS — E1159 Type 2 diabetes mellitus with other circulatory complications: Secondary | ICD-10-CM | POA: Diagnosis present

## 2019-01-15 DIAGNOSIS — E876 Hypokalemia: Secondary | ICD-10-CM | POA: Diagnosis not present

## 2019-01-15 DIAGNOSIS — Z20828 Contact with and (suspected) exposure to other viral communicable diseases: Secondary | ICD-10-CM | POA: Diagnosis not present

## 2019-01-15 DIAGNOSIS — E1165 Type 2 diabetes mellitus with hyperglycemia: Secondary | ICD-10-CM | POA: Diagnosis not present

## 2019-01-15 LAB — BASIC METABOLIC PANEL
Anion gap: 11 (ref 5–15)
BUN: 13 mg/dL (ref 6–20)
CO2: 24 mmol/L (ref 22–32)
Calcium: 9.4 mg/dL (ref 8.9–10.3)
Chloride: 101 mmol/L (ref 98–111)
Creatinine, Ser: 0.9 mg/dL (ref 0.61–1.24)
GFR calc Af Amer: >60 mL/min (ref >60)
GFR calc non Af Amer: >60 mL/min (ref >60)
Glucose, Bld: 325 mg/dL — ABNORMAL HIGH (ref 70–99)
Potassium: 3.8 mmol/L (ref 3.5–5.1)
Sodium: 136 mmol/L (ref 135–145)

## 2019-01-15 LAB — CBC
HCT: 46.5 % (ref 39.0–52.0)
Hemoglobin: 15.5 g/dL (ref 13.0–17.0)
MCH: 27.1 pg (ref 26.0–34.0)
MCHC: 33.3 g/dL (ref 30.0–36.0)
MCV: 81.2 fL (ref 80.0–100.0)
Platelets: 340 10*3/uL (ref 150–400)
RBC: 5.73 MIL/uL (ref 4.22–5.81)
RDW: 13.2 % (ref 11.5–15.5)
WBC: 12.4 10*3/uL — ABNORMAL HIGH (ref 4.0–10.5)
nRBC: 0 % (ref 0.0–0.2)

## 2019-01-15 LAB — CBG MONITORING, ED: Glucose-Capillary: 217 mg/dL — ABNORMAL HIGH (ref 70–99)

## 2019-01-15 LAB — TROPONIN I (HIGH SENSITIVITY)
Troponin I (High Sensitivity): 11 ng/L (ref <18)
Troponin I (High Sensitivity): 11 ng/L (ref <18)

## 2019-01-15 MED ORDER — ROSUVASTATIN CALCIUM 20 MG PO TABS
20.0000 mg | ORAL_TABLET | Freq: Every day | ORAL | Status: DC
  Administered 2019-01-16: 20 mg via ORAL
  Filled 2019-01-15: qty 1

## 2019-01-15 MED ORDER — INSULIN ASPART 100 UNIT/ML ~~LOC~~ SOLN
0.0000 [IU] | Freq: Every day | SUBCUTANEOUS | Status: DC
  Administered 2019-01-15: 2 [IU] via SUBCUTANEOUS
  Filled 2019-01-15: qty 0.05

## 2019-01-15 MED ORDER — ACETAMINOPHEN 325 MG PO TABS: 650.0000 mg | ORAL_TABLET | ORAL | Status: DC | PRN

## 2019-01-15 MED ORDER — INSULIN ASPART 100 UNIT/ML ~~LOC~~ SOLN
0.0000 [IU] | Freq: Three times a day (TID) | SUBCUTANEOUS | Status: DC
  Administered 2019-01-16 (×3): 5 [IU] via SUBCUTANEOUS
  Filled 2019-01-15: qty 0.09

## 2019-01-15 MED ORDER — NITROGLYCERIN 0.4 MG SL SUBL: 0.4000 mg | SUBLINGUAL_TABLET | SUBLINGUAL | Status: DC | PRN

## 2019-01-15 MED ORDER — ONDANSETRON HCL 4 MG/2ML IJ SOLN: 4.0000 mg | Freq: Four times a day (QID) | INTRAMUSCULAR | Status: DC | PRN

## 2019-01-15 MED ORDER — HYDRALAZINE HCL 20 MG/ML IJ SOLN: 5.0000 mg | INTRAMUSCULAR | Status: DC | PRN

## 2019-01-15 MED ORDER — ASPIRIN 81 MG PO CHEW
244.0000 mg | CHEWABLE_TABLET | Freq: Once | ORAL | Status: AC
  Administered 2019-01-15: 19:00:00 243 mg via ORAL
  Filled 2019-01-15: qty 4

## 2019-01-15 MED ORDER — NITROGLYCERIN 0.4 MG SL SUBL
0.4000 mg | SUBLINGUAL_TABLET | SUBLINGUAL | Status: AC | PRN
  Administered 2019-01-15 (×3): 0.4 mg via SUBLINGUAL
  Filled 2019-01-15: qty 1

## 2019-01-15 MED ORDER — ENOXAPARIN SODIUM 40 MG/0.4ML ~~LOC~~ SOLN
40.0000 mg | SUBCUTANEOUS | Status: DC
  Administered 2019-01-15: 23:00:00 40 mg via SUBCUTANEOUS

## 2019-01-15 MED ORDER — ASPIRIN EC 81 MG PO TBEC
81.0000 mg | DELAYED_RELEASE_TABLET | Freq: Every day | ORAL | Status: DC
  Administered 2019-01-16: 81 mg via ORAL
  Filled 2019-01-15: qty 1

## 2019-01-15 MED ORDER — LISINOPRIL 10 MG PO TABS
10.0000 mg | ORAL_TABLET | Freq: Every day | ORAL | Status: DC
  Administered 2019-01-16: 10 mg via ORAL
  Filled 2019-01-15: qty 1

## 2019-01-15 MED ORDER — SODIUM CHLORIDE 0.9% FLUSH
3.0000 mL | Freq: Once | INTRAVENOUS | Status: AC
  Administered 2019-01-15: 18:00:00 3 mL via INTRAVENOUS

## 2019-01-15 MED ORDER — NITROGLYCERIN 2 % TD OINT
1.0000 [in_us] | TOPICAL_OINTMENT | Freq: Once | TRANSDERMAL | Status: AC
  Administered 2019-01-15: 1 [in_us] via TOPICAL
  Filled 2019-01-15: qty 30

## 2019-01-15 MED ORDER — AMLODIPINE BESYLATE 10 MG PO TABS
10.0000 mg | ORAL_TABLET | Freq: Every day | ORAL | Status: DC
  Administered 2019-01-16: 10:00:00 10 mg via ORAL
  Filled 2019-01-15: qty 1

## 2019-01-15 NOTE — ED Notes (Signed)
MD at bedside. 

## 2019-01-15 NOTE — ED Provider Notes (Signed)
[facility] DEPT Provider Note   CSN: [id number] Arrival date & time: 01/15/19  1808     History   Chief Complaint Chief Complaint  Patient presents with  . Chest Pain    HPI [name] is a 38 y.o. male.      Chest Pain   Pt was seen at 1825. Per pt, c/o gradual onset and persistence of constant mid-sternal chest "pain" since 4:30pm PTA. Pt states he was sitting watching videos when his CP began. Pt describes the CP as "tightness," that radiates to his left shoulder and neck. Denies palpitations, no SOB/cough, no abd pain, no N/V/D, no injury, no fevers, no rash, no focal motor weakness. Pt states he has been off his DM meds for the past 2 years, and ran out of all his other meds "at least" 5 months ago (BP and cholesterol meds).   Past Medical History:  Diagnosis Date  . Allergies   . Anemia   . Anxiety   . Depression   . Diabetes mellitus   . GERD (gastroesophageal reflux disease)   . Hyperlipidemia   . Hypertension   . Lumbar disc herniation   . 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 PTSD (post-traumatic stress disorder)   . Scoliosis     Patient Active Problem List   Diagnosis Date Noted  . Essential hypertension 04/17/2018  . Dyslipidemia 04/17/2018  . PTSD (post-traumatic stress disorder) 08/11/2016  . Spinal stenosis, lumbar region with neurogenic claudication 05/05/2016  . Left epididymitis 03/16/2016  . Dysthymia 09/02/2015  . Chronic low back pain 09/02/2015  . Uncontrolled type 2 diabetes mellitus without complication ([place]) [id number]  . UNSPECIFIED ANEMIA 03/11/2008    Past Surgical History:  Procedure Laterality Date  . LUMBAR LAMINECTOMY/DECOMPRESSION MICRODISCECTOMY Left 05/05/2016   Procedure: CENTRAL DECOMPRESSION L4-L5 AND FORAMINOTOMY FOR L4 ROOT AND L5 ROOT ON THE LEFT;  Surgeon: Latanya Maudlin, MD;  Location: [facility];  Service: Orthopedics;  Laterality: Left;  . NO PAST  SURGERIES          Home Medications    Prior to Admission medications   Medication Sig Start Date End Date Taking? Authorizing Provider  acetaminophen (TYLENOL) 500 MG tablet Take 1,000 mg by mouth every 6 (six) hours as needed for mild pain.    [provider]  amitriptyline (ELAVIL) 25 MG tablet Take 25-50 mg by mouth daily as needed for sleep. 04/16/18   [provider]  amLODipine (NORVASC) 10 MG tablet Take 1 tablet (10 mg total) by mouth daily. 04/17/18   Horald Pollen, MD  aspirin EC 81 MG tablet Take 1 tablet (81 mg total) by mouth daily. 07/11/14   Shawnee Knapp, MD  diphenhydrAMINE (BENADRYL) 25 mg capsule Take 25-50 mg by mouth daily as needed for allergies (DEPENDS ON HOW BAD ALLERGIES IF TAKES 25-50 MG).    [provider]  Dulaglutide (TRULICITY) A999333 0000000 SOPN Inject 1 pen into the skin every Saturday.    [provider]  DULoxetine (CYMBALTA) 20 MG capsule Take 20 mg by mouth at bedtime. 04/21/18   [provider]  ibuprofen (ADVIL,MOTRIN) 200 MG tablet Take 800 mg by mouth every 6 (six) hours as needed for mild pain.    [provider]  lisinopril (PRINIVIL,ZESTRIL) 10 MG tablet Take 1 tablet (10 mg total) by mouth daily. 04/17/18   Horald Pollen, MD  ondansetron (ZOFRAN ODT) 4 MG disintegrating tablet Take  1 tablet (4 mg total) by mouth every 8 (eight) hours as needed. 09/29/18   McDonald, Mia A, PA-C  rosuvastatin (CRESTOR) 20 MG tablet Take 1 tablet (20 mg total) by mouth daily. 04/17/18   Horald Pollen, MD    Family History Family History  Problem Relation Age of Onset  . Stroke Maternal Grandmother     Social History Social History   Tobacco Use  . Smoking status: Former Smoker    Types: Cigars  . Smokeless tobacco: Current User    Types: Snuff, Chew  Substance Use Topics  . Alcohol use: Yes    Alcohol/week: 5.0 standard drinks    Types: 5 Shots of liquor per week    Comment: few  times year  . Drug use: No     Allergies   Amoxicillin, Penicillins, Metformin and related, and Other   Review of Systems Review of Systems  Cardiovascular: Positive for chest pain.   ROS: Statement: All systems negative except as marked or noted in the HPI; Constitutional: Negative for fever and chills. ; ; Eyes: Negative for eye pain, redness and discharge. ; ; ENMT: Negative for ear pain, hoarseness, nasal congestion, sinus pressure and sore throat. ; ; Cardiovascular: +CP. Negative for palpitations, diaphoresis, dyspnea and peripheral edema. ; ; Respiratory: Negative for cough, wheezing and stridor. ; ; Gastrointestinal: Negative for nausea, vomiting, diarrhea, abdominal pain, blood in stool, hematemesis, jaundice and rectal bleeding. . ; ; Genitourinary: Negative for dysuria, flank pain and hematuria. ; ; Musculoskeletal: Negative for back pain and neck pain. Negative for swelling and trauma.; ; Skin: Negative for pruritus, rash, abrasions, blisters, bruising and skin lesion.; ; Neuro: Negative for headache, lightheadedness and neck stiffness. Negative for weakness, altered level of consciousness, altered mental status, extremity weakness, paresthesias, involuntary movement, seizure and syncope.      Physical Exam Updated Vital Signs BP (!) 211/124 (BP Location: Right Arm)   Pulse (!) 110   Temp 98.7 F (37.1 C) (Oral)   Resp 18   Ht 6\' 3"  (1.905 m)   Wt (!) 145.2 kg   SpO2 99%   BMI 40.00 kg/m    Patient Vitals for the past 24 hrs:  BP Temp Temp src Pulse Resp SpO2 Height Weight  01/15/19 2130 (!) 151/88 - - 95 (!) 24 98 % - -  01/15/19 2100 (!) 159/99 - - 89 17 97 % - -  01/15/19 2030 (!) 144/90 - - 97 (!) 22 98 % - -  01/15/19 1943 (!) 158/87 - - 96 18 98 % - -  01/15/19 1900 (!) 159/97 - - (!) 112 - 97 % - -  01/15/19 1823 (!) 211/124 98.7 F (37.1 C) Oral (!) 110 18 99 % - -  01/15/19 1817 - - - - - - 6\' 3"  (1.905 m) (!) 145.2 kg     Physical Exam 1830: Physical  examination:  Nursing notes reviewed; Vital signs and O2 SAT reviewed;  Constitutional: Well developed, Well nourished, Well hydrated, In no acute distress; Head:  Normocephalic, atraumatic; Eyes: EOMI, PERRL, No scleral icterus; ENMT: Mouth and pharynx normal, Mucous membranes moist; Neck: Supple, Full range of motion, No lymphadenopathy; Cardiovascular: Regular rate and rhythm, No gallop; Respiratory: Breath sounds clear & equal bilaterally, No wheezes.  Speaking full sentences with ease, Normal respiratory effort/excursion; Chest: Nontender, Movement normal; Abdomen: Soft, Nontender, Nondistended, Normal bowel sounds; Genitourinary: No CVA tenderness; Extremities: Peripheral pulses normal, No tenderness, No edema, No calf edema or asymmetry.;  Neuro: AA&Ox3, Major CN grossly intact.  Speech clear. No gross focal motor or sensory deficits in extremities.; Skin: Color normal, Warm, Dry.   ED Treatments / Results  Labs (all labs ordered are listed, but only abnormal results are displayed)   EKG EKG Interpretation  Date/Time:  Monday January 15 2019 18:22:53 EDT Ventricular Rate:  108 PR Interval:    QRS Duration: 86 QT Interval:  334 QTC Calculation: 448 R Axis:   47 Text Interpretation:  Sinus tachycardia When compared with ECG of 10/23/2015 No significant change was found Confirmed by Francine Graven [phone]) on 01/15/2019 6:36:09 PM   Radiology   Procedures Procedures (including critical care time)  Medications Ordered in ED Medications  sodium chloride flush (NS) 0.9 % injection 3 mL (3 mLs Intravenous Given 01/15/19 1826)  aspirin chewable tablet 244 mg (243 mg Oral Given 01/15/19 1836)  nitroGLYCERIN (NITROSTAT) SL tablet 0.4 mg (0.4 mg Sublingual Given 01/15/19 1856)  nitroGLYCERIN (NITROGLYN) 2 % ointment 1 inch (1 inch Topical Given 01/15/19 2013)     Initial Impression / Assessment and Plan / ED Course  I have reviewed the triage vital signs and the nursing notes.  Pertinent  labs & imaging results that were available during my care of the patient were reviewed by me and considered in my medical decision making (see chart for details).     MDM Reviewed: previous chart, nursing note and vitals Reviewed previous: labs and ECG Interpretation: labs, ECG and x-ray Total time providing critical care: 30-74 minutes. This excludes time spent performing separately reportable procedures and services. Consults: cardiology and admitting MD   CRITICAL CARE Performed by: Francine Graven Total critical care time: 35 minutes Critical care time was exclusive of separately billable procedures and treating other patients. Critical care was necessary to treat or prevent imminent or life-threatening deterioration. Critical care was time spent personally by me on the following activities: development of treatment plan with patient and/or surrogate as well as nursing, discussions with consultants, evaluation of patient's response to treatment, examination of patient, obtaining history from patient or surrogate, ordering and performing treatments and interventions, ordering and review of laboratory studies, ordering and review of radiographic studies, pulse oximetry and re-evaluation of patient's condition.   Results for orders placed or performed during the hospital encounter of A999333  Basic metabolic panel  Result Value Ref Range   Sodium 136 135 - 145 mmol/L   Potassium 3.8 3.5 - 5.1 mmol/L   Chloride 101 98 - 111 mmol/L   CO2 24 22 - 32 mmol/L   Glucose, Bld 325 (H) 70 - 99 mg/dL   BUN 13 6 - 20 mg/dL   Creatinine, Ser 0.90 0.61 - 1.24 mg/dL   Calcium 9.4 8.9 - 10.3 mg/dL   GFR calc non Af Amer >60 >60 mL/min   GFR calc Af Amer >60 >60 mL/min   Anion gap 11 5 - 15  CBC  Result Value Ref Range   WBC 12.4 (H) 4.0 - 10.5 K/uL   RBC 5.73 4.22 - 5.81 MIL/uL   Hemoglobin 15.5 13.0 - 17.0 g/dL   HCT 46.5 39.0 - 52.0 %   MCV 81.2 80.0 - 100.0 fL   MCH 27.1 26.0 - 34.0 pg    MCHC 33.3 30.0 - 36.0 g/dL   RDW 13.2 11.5 - 15.5 %   Platelets 340 150 - 400 K/uL   nRBC 0.0 0.0 - 0.2 %  Troponin I (High Sensitivity)  Result Value Ref Range  Troponin I (High Sensitivity) 11 <18 ng/L  Troponin I (High Sensitivity)  Result Value Ref Range   Troponin I (High Sensitivity) 11 <18 ng/L   Dg Chest 2 View Result Date: 01/15/2019 CLINICAL DATA:  Central chest pain. EXAM: CHEST - 2 VIEW COMPARISON:  October 23, 2015 FINDINGS: Cardiomediastinal silhouette is normal. Mediastinal contours appear intact. There is no evidence of focal airspace consolidation, pleural effusion or pneumothorax. Osseous structures are without acute abnormality. Soft tissues are grossly normal. IMPRESSION: No active cardiopulmonary disease. Electronically Signed   By: Fidela Salisbury M.D.   On: 01/15/2019 18:47   [name] was evaluated in Emergency Department on 01/15/2019 for the symptoms described in the history of present illness. He was evaluated in the context of the global COVID-19 pandemic, which necessitated consideration that the patient might be at risk for infection with the SARS-CoV-2 virus that causes COVID-19. Institutional protocols and algorithms that pertain to the evaluation of patients at risk for COVID-19 are in a state of rapid change based on information released by regulatory bodies including the CDC and federal and state organizations. These policies and algorithms were followed during the patient's care in the ED.    2140:  CP improved with decreasing BP. Pt now states he had ringing in his ears and a headache when he arrived; also resolved.  T/C returned from [facility] Dr. Paticia Stack, case discussed, including:  HPI, pertinent PM/SHx, VS/PE, dx testing, ED course and treatment:  2 negative troponin reassuring, pt does not need emergent cardiac intervention at this time.   2205:  T/C returned from [facility] Dr. Posey Pronto, case discussed, including:  HPI, pertinent PM/SHx, VS/PE, dx testing, ED  course and treatment:  Agreeable to come to ED for evaluation for observation admit.       Final Clinical Impressions(s) / ED Diagnoses   Final diagnoses:  None    ED Discharge Orders    None       Francine Graven, DO 01/19/19 0825

## 2019-01-15 NOTE — ED Triage Notes (Signed)
Pt c/o chest pain in the center of his chest since waking up. Pt also c/o ringing in his ears and a headache.

## 2019-01-15 NOTE — H&P (Signed)
History and Physical    [name] [id number] DOB: 10/27/1980 DOA: 01/15/2019  PCP: Horald Pollen, MD  Patient coming from: Home  I have personally briefly reviewed patient's old medical records in [place]  Chief Complaint: Chest pain  HPI: [name] is a 38 y.o. male with medical history significant for type 2 diabetes, hypertension, hyperlipidemia, depression/anxiety who presents to the ED for evaluation of chest pain.  Patient states he was in his usual state of health until around 4:30 PM on 01/15/2019.  He was resting while watching TV when he had sudden onset of left-sided dull chest discomfort with radiation to his left shoulder and left neck.  He had associated palpitations, nausea without emesis, and a ringing sensation in his ears.  He laid down and symptoms slowly subsided over 15 minutes.  He denies any associated dyspnea, cough, abdominal pain, or dysuria.  Patient states he has been out of his home blood pressure medications for at least 5 months and has not been on his diabetes medications for at least 2 years.  He denies any similar episode in the past but reports an episode of hypertensive encephalopathy approximately 8 to 9 years ago.  ED Course:  Initial vitals showed BP 211/124, pulse 110, RR 18, temp 98.7 Fahrenheit, SPO2 99% on room air.  Labs notable for WBC 12.4, hemoglobin 15.5, platelets 340,000, potassium 3.8, bicarb 24, BUN 13, creatinine 0.9, serum glucose 325, anion gap 11, high-sensitivity troponin I 112.  SARS-CoV-2 screening test was ordered and pending.  EKG showed sinus tachycardia without acute ischemic changes.  2 view chest x-ray showed normal cardiac silhouette without focal consolidation, edema, or effusion.  Patient was given aspirin 244 mg, sublingual and topical nitroglycerin with improvement in blood pressure to [id number] systolic.  Chest pain resolved.  Hospitalist service was consulted admit for further evaluation and  management of chest pain.   Review of Systems: All systems reviewed and are negative except as documented in history of present illness above.   Past Medical History:  Diagnosis Date  . Allergies   . Anemia   . Anxiety   . Depression   . Diabetes mellitus   . GERD (gastroesophageal reflux disease)   . Hyperlipidemia   . Hypertension   . Lumbar disc herniation   . 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 PTSD (post-traumatic stress disorder)   . Scoliosis     Past Surgical History:  Procedure Laterality Date  . LUMBAR LAMINECTOMY/DECOMPRESSION MICRODISCECTOMY Left 05/05/2016   Procedure: CENTRAL DECOMPRESSION L4-L5 AND FORAMINOTOMY FOR L4 ROOT AND L5 ROOT ON THE LEFT;  Surgeon: Latanya Maudlin, MD;  Location: [facility];  Service: Orthopedics;  Laterality: Left;  . NO PAST SURGERIES      Social History:  reports that he has quit smoking. His smoking use included cigars. His smokeless tobacco use includes snuff and chew. He reports current alcohol use of about 5.0 standard drinks of alcohol per week. He reports that he does not use drugs.  Allergies  Allergen Reactions  . Amoxicillin Anaphylaxis and Other (See Comments)    Has patient had a PCN reaction causing immediate rash, facial/tongue/throat swelling, SOB or lightheadedness with hypotension: yes Has patient had a PCN reaction causing severe rash involving mucus membranes or skin necrosis: no Has patient had a PCN reaction that required hospitalization yes Has patient had a PCN reaction occurring within the last 10 years: yes If all of the  above answers are "NO", then may proceed with Cephalosporin use.  [name] Penicillins Anaphylaxis and Other (See Comments)    Patient was told to list this as an allergy because he is allergic to Amoxicillin Has patient had a PCN reaction causing immediate rash, facial/tongue/throat swelling, SOB or lightheadedness with hypotension: yes for Amoxicillin Has patient had a  PCN reaction causing severe rash involving mucus membranes or skin necrosis: no Has patient had a PCN reaction that required hospitalization yes for Amoxicillin Has patient had a PCN reaction occurring within the last 10 years: yes for Amoxicillin If all o  . Metformin And Related Other (See Comments)    SEVERE GI UPSET  . Other Other (See Comments)    Powder in some gloves - localized itching but not allergic to latex, benadryl usually helps with this reaction    Family History  Problem Relation Age of Onset  . Stroke Maternal Grandmother      Prior to Admission medications   Medication Sig Start Date End Date Taking? Authorizing Provider  acetaminophen (TYLENOL) 500 MG tablet Take 1,000 mg by mouth every 6 (six) hours as needed for mild pain.   Yes [provider]  aspirin EC 81 MG tablet Take 1 tablet (81 mg total) by mouth daily. 07/11/14  Yes Shawnee Knapp, MD  amLODipine (NORVASC) 10 MG tablet Take 1 tablet (10 mg total) by mouth daily. Patient not taking: Reported on 01/15/2019 04/17/18   Horald Pollen, MD  lisinopril (PRINIVIL,ZESTRIL) 10 MG tablet Take 1 tablet (10 mg total) by mouth daily. Patient not taking: Reported on 01/15/2019 04/17/18   Horald Pollen, MD  ondansetron (ZOFRAN ODT) 4 MG disintegrating tablet Take 1 tablet (4 mg total) by mouth every 8 (eight) hours as needed. Patient not taking: Reported on 01/15/2019 09/29/18   McDonald, Maree Erie A, PA-C  rosuvastatin (CRESTOR) 20 MG tablet Take 1 tablet (20 mg total) by mouth daily. Patient not taking: Reported on 01/15/2019 04/17/18   Horald Pollen, MD    Physical Exam: Vitals:   01/15/19 1943 01/15/19 2030 01/15/19 2100 01/15/19 2130  BP: (!) 158/87 (!) 144/90 (!) 159/99 (!) 151/88  Pulse: 96 97 89 95  Resp: 18 (!) 22 17 (!) 24  Temp:      TempSrc:      SpO2: 98% 98% 97% 98%  Weight:      Height:        Constitutional: Obese man resting supine in bed, NAD, calm, comfortable Eyes: PERRL, lids  and conjunctivae normal ENMT: Mucous membranes are moist. Posterior pharynx clear of any exudate or lesions.Normal dentition.  Neck: normal, supple, no masses. Respiratory: clear to auscultation bilaterally, no wheezing, no crackles. Normal respiratory effort. No accessory muscle use.  Cardiovascular: Borderline tachycardia with regular rhythm, no murmurs / rubs / gallops. No extremity edema. 2+ pedal pulses. Abdomen: no tenderness, no masses palpated. No hepatosplenomegaly. Bowel sounds positive.  Musculoskeletal: no clubbing / cyanosis. No joint deformity upper and lower extremities. Good ROM, no contractures. Normal muscle tone.  Skin: no rashes, lesions, ulcers. No induration Neurologic: CN 2-12 grossly intact. Sensation intact, Strength 5/5 in all 4.  Psychiatric: Normal judgment and insight. Alert and oriented x 3. Normal mood.     Labs on Admission: I have personally reviewed following labs and imaging studies  CBC: Recent Labs  Lab 01/15/19 1826  WBC 12.4*  HGB 15.5  HCT 46.5  MCV 81.2  PLT [id number]   Basic Metabolic Panel: Recent  Labs  Lab 01/15/19 1826  NA 136  K 3.8  CL 101  CO2 24  GLUCOSE 325*  BUN 13  CREATININE 0.90  CALCIUM 9.4   GFR: Estimated Creatinine Clearance: 174.6 mL/min (by C-G formula based on SCr of 0.9 mg/dL). Liver Function Tests: No results for input(s): AST, ALT, ALKPHOS, BILITOT, PROT, ALBUMIN in the last 168 hours. No results for input(s): LIPASE, AMYLASE in the last 168 hours. No results for input(s): AMMONIA in the last 168 hours. Coagulation Profile: No results for input(s): INR, PROTIME in the last 168 hours. Cardiac Enzymes: No results for input(s): CKTOTAL, CKMB, CKMBINDEX, TROPONINI in the last 168 hours. BNP (last 3 results) No results for input(s): PROBNP in the last 8760 hours. HbA1C: No results for input(s): HGBA1C in the last 72 hours. CBG: No results for input(s): GLUCAP in the last 168 hours. Lipid Profile: No results  for input(s): CHOL, HDL, LDLCALC, TRIG, CHOLHDL, LDLDIRECT in the last 72 hours. Thyroid Function Tests: No results for input(s): TSH, T4TOTAL, FREET4, T3FREE, THYROIDAB in the last 72 hours. Anemia Panel: No results for input(s): VITAMINB12, FOLATE, FERRITIN, TIBC, IRON, RETICCTPCT in the last 72 hours. Urine analysis:    Component Value Date/Time   COLORURINE RED (A) 09/28/2018 2252   APPEARANCEUR CLOUDY (A) 09/28/2018 2252   LABSPEC 1.024 09/28/2018 2252   PHURINE 6.0 09/28/2018 2252   GLUCOSEU >=500 (A) 09/28/2018 2252   GLUCOSEU NEGATIVE 04/26/2008 0837   HGBUR LARGE (A) 09/28/2018 2252   BILIRUBINUR NEGATIVE 09/28/2018 2252   BILIRUBINUR Small 12/26/2014 1018   KETONESUR 20 (A) 09/28/2018 2252   PROTEINUR 100 (A) 09/28/2018 2252   UROBILINOGEN 0.2 12/26/2014 1018   UROBILINOGEN 0.2 mg/dL 04/26/2008 0837   NITRITE POSITIVE (A) 09/28/2018 2252   LEUKOCYTESUR MODERATE (A) 09/28/2018 2252    Radiological Exams on Admission: Dg Chest 2 View  Result Date: 01/15/2019 CLINICAL DATA:  Central chest pain. EXAM: CHEST - 2 VIEW COMPARISON:  October 23, 2015 FINDINGS: Cardiomediastinal silhouette is normal. Mediastinal contours appear intact. There is no evidence of focal airspace consolidation, pleural effusion or pneumothorax. Osseous structures are without acute abnormality. Soft tissues are grossly normal. IMPRESSION: No active cardiopulmonary disease. Electronically Signed   By: Fidela Salisbury M.D.   On: 01/15/2019 18:47    EKG: Independently reviewed.  Sinus tachycardia.  Rate is faster compared to prior.  Assessment/Plan Principal Problem:   Chest pain Active Problems:   Uncontrolled type 2 diabetes mellitus without complication ([facility])   Essential hypertension   Dyslipidemia  CHAS BRANAN is a 38 y.o. male with medical history significant for type 2 diabetes, hypertension, hyperlipidemia, depression/anxiety who is admitted with chest pain and hypertensive urgency.    Chest pain: Suspect due to hypertensive urgency.  Symptoms improved with nitroglycerin and improved blood pressure.  Troponin negative x2.  EKG with sinus tachycardia and no acute ischemic changes. -Monitor on telemetry -Repeat cardiac enzyme -Obtain echocardiogram -Continue aspirin 81 daily, nitroglycerin as needed -Repeat EKG in a.m. -Check lipid panel -BP control as below  Hypertensive urgency: Has been off home meds for at least 5 months.  BP improved with nitroglycerin.  Restart amlodipine and lisinopril, use IV hydralazine as needed.  Uncontrolled type 2 diabetes with hyperglycemia: Off of meds for at least 2 years.  Last A1c was 11.1 on 04/17/2018.  He reports a GI side effect of metformin in the past. -Sensitive SSI while in hospital  Hyperlipidemia: Check lipid panel and restart rosuvastatin.  DVT prophylaxis: Lovenox  Code Status: Full code, confirmed with patient Family Communication: Discussed with patient Disposition Plan: Likely discharge to home pending clinical progress Consults called: None Admission status: Observation   Zada Finders MD [facility]  If 7PM-7AM, please contact night-coverage [email or web address]  01/15/2019, 11:15 PM

## 2019-01-16 ENCOUNTER — Observation Stay (HOSPITAL_BASED_OUTPATIENT_CLINIC_OR_DEPARTMENT_OTHER): Payer: 59

## 2019-01-16 ENCOUNTER — Other Ambulatory Visit: Payer: Self-pay

## 2019-01-16 DIAGNOSIS — E1165 Type 2 diabetes mellitus with hyperglycemia: Secondary | ICD-10-CM | POA: Diagnosis not present

## 2019-01-16 DIAGNOSIS — E785 Hyperlipidemia, unspecified: Secondary | ICD-10-CM

## 2019-01-16 DIAGNOSIS — R079 Chest pain, unspecified: Secondary | ICD-10-CM

## 2019-01-16 DIAGNOSIS — I160 Hypertensive urgency: Secondary | ICD-10-CM | POA: Diagnosis not present

## 2019-01-16 DIAGNOSIS — E876 Hypokalemia: Secondary | ICD-10-CM

## 2019-01-16 DIAGNOSIS — I10 Essential (primary) hypertension: Secondary | ICD-10-CM | POA: Diagnosis not present

## 2019-01-16 DIAGNOSIS — F419 Anxiety disorder, unspecified: Secondary | ICD-10-CM | POA: Diagnosis not present

## 2019-01-16 LAB — GLUCOSE, CAPILLARY
Glucose-Capillary: 251 mg/dL — ABNORMAL HIGH (ref 70–99)
Glucose-Capillary: 297 mg/dL — ABNORMAL HIGH (ref 70–99)
Glucose-Capillary: 265 mg/dL — ABNORMAL HIGH (ref 70–99)

## 2019-01-16 LAB — BASIC METABOLIC PANEL
Anion gap: 10 (ref 5–15)
BUN: 13 mg/dL (ref 6–20)
CO2: 25 mmol/L (ref 22–32)
Calcium: 8.7 mg/dL — ABNORMAL LOW (ref 8.9–10.3)
Chloride: 103 mmol/L (ref 98–111)
Creatinine, Ser: 0.74 mg/dL (ref 0.61–1.24)
GFR calc Af Amer: >60 mL/min (ref >60)
GFR calc non Af Amer: >60 mL/min (ref >60)
Glucose, Bld: 232 mg/dL — ABNORMAL HIGH (ref 70–99)
Potassium: 3.4 mmol/L — ABNORMAL LOW (ref 3.5–5.1)
Sodium: 138 mmol/L (ref 135–145)

## 2019-01-16 LAB — CBC
HCT: 38.7 % — ABNORMAL LOW (ref 39.0–52.0)
Hemoglobin: 12.6 g/dL — ABNORMAL LOW (ref 13.0–17.0)
MCH: 27 pg (ref 26.0–34.0)
MCHC: 32.6 g/dL (ref 30.0–36.0)
MCV: 83 fL (ref 80.0–100.0)
Platelets: 252 10*3/uL (ref 150–400)
RBC: 4.66 MIL/uL (ref 4.22–5.81)
RDW: 13.3 % (ref 11.5–15.5)
WBC: 10.5 10*3/uL (ref 4.0–10.5)
nRBC: 0 % (ref 0.0–0.2)

## 2019-01-16 LAB — ECHOCARDIOGRAM COMPLETE
Height: 75 in
Weight: 5037.07 oz

## 2019-01-16 LAB — LIPID PANEL
Cholesterol: 203 mg/dL — ABNORMAL HIGH (ref 0–200)
HDL: 32 mg/dL — ABNORMAL LOW (ref >40)
LDL Cholesterol: UNDETERMINED mg/dL (ref 0–99)
Total CHOL/HDL Ratio: 6.3 RATIO
Triglycerides: 466 mg/dL — ABNORMAL HIGH (ref <150)
VLDL: UNDETERMINED mg/dL (ref 0–40)

## 2019-01-16 LAB — TROPONIN I (HIGH SENSITIVITY): Troponin I (High Sensitivity): 11 ng/L (ref <18)

## 2019-01-16 LAB — LDL CHOLESTEROL, DIRECT: Direct LDL: 76.9 mg/dL (ref 0–99)

## 2019-01-16 LAB — SARS CORONAVIRUS 2 BY RT PCR (HOSPITAL ORDER, PERFORMED IN ~~LOC~~ HOSPITAL LAB): SARS Coronavirus 2: NEGATIVE

## 2019-01-16 MED ORDER — BLOOD GLUCOSE METER KIT: PACK | 0 refills | Status: AC

## 2019-01-16 MED ORDER — INSULIN GLARGINE 100 UNIT/ML SOLOSTAR PEN: 25.0000 [IU] | PEN_INJECTOR | Freq: Every day | SUBCUTANEOUS | 1 refills | Status: DC

## 2019-01-16 MED ORDER — INSULIN LISPRO (1 UNIT DIAL) 100 UNIT/ML (KWIKPEN): 8.0000 [IU] | PEN_INJECTOR | Freq: Three times a day (TID) | SUBCUTANEOUS | 1 refills | Status: DC

## 2019-01-16 MED ORDER — AMLODIPINE BESYLATE 10 MG PO TABS: 10.0000 mg | ORAL_TABLET | Freq: Every day | ORAL | 2 refills | Status: DC

## 2019-01-16 MED ORDER — INSULIN GLARGINE 100 UNIT/ML ~~LOC~~ SOLN
10.0000 [IU] | Freq: Every day | SUBCUTANEOUS | Status: DC
  Administered 2019-01-16: 10:00:00 10 [IU] via SUBCUTANEOUS
  Filled 2019-01-16: qty 0.1

## 2019-01-16 MED ORDER — ROSUVASTATIN CALCIUM 20 MG PO TABS: 20.0000 mg | ORAL_TABLET | Freq: Every day | ORAL | 1 refills | Status: DC

## 2019-01-16 MED ORDER — POTASSIUM CHLORIDE CRYS ER 20 MEQ PO TBCR
40.0000 meq | EXTENDED_RELEASE_TABLET | Freq: Once | ORAL | Status: AC
  Administered 2019-01-16: 40 meq via ORAL
  Filled 2019-01-16: qty 2

## 2019-01-16 MED ORDER — NITROGLYCERIN 0.4 MG SL SUBL: 0.4000 mg | SUBLINGUAL_TABLET | SUBLINGUAL | 0 refills | Status: DC | PRN

## 2019-01-16 MED ORDER — LISINOPRIL 20 MG PO TABS: 20.0000 mg | ORAL_TABLET | Freq: Every day | ORAL | 1 refills | Status: DC

## 2019-01-16 MED ORDER — LIVING WELL WITH DIABETES BOOK
Freq: Once | Status: AC
  Administered 2019-01-16: 17:00:00
  Filled 2019-01-16: qty 1

## 2019-01-16 MED ORDER — NOVOLOG FLEXPEN 100 UNIT/ML ~~LOC~~ SOPN: 8.0000 [IU] | PEN_INJECTOR | Freq: Three times a day (TID) | SUBCUTANEOUS | 1 refills | Status: DC

## 2019-01-16 MED ORDER — ENOXAPARIN SODIUM 40 MG/0.4ML ~~LOC~~ SOLN
40.0000 mg | SUBCUTANEOUS | Status: DC
  Administered 2019-01-16: 10:00:00 40 mg via SUBCUTANEOUS
  Filled 2019-01-16: qty 0.4

## 2019-01-16 NOTE — Discharge Summary (Signed)
Physician Discharge Summary  [name] FWY:[id number] DOB: 1980-12-17 DOA: 01/15/2019  PCP: Horald Pollen, MD  Admit date: 01/15/2019 Discharge date: 01/16/2019  Time spent: 50 minutes  Recommendations for Outpatient Follow-up:  1. Follow-up with Horald Pollen, MD in 1 to 2 weeks.  On follow-up patient's diabetes will need to be reassessed.  Patient will need a basic metabolic profile done to follow-up on electrolytes and renal function.  Patient's blood pressure needs to be reassessed.  Importance of compliance was stressed to patient. 2. Follow-up with  [facility], cardiology.  Patient will be called with an appointment time for outpatient stress test.     Discharge Diagnoses:  Principal Problem:   Chest pain Active Problems:   Uncontrolled type 2 diabetes mellitus without complication ([place])   Essential hypertension   Dyslipidemia   Chest pain in adult   Hypertensive urgency   Hypokalemia   Discharge Condition: Stable and improved  Diet recommendation: Heart healthy  Filed Weights   01/15/19 1817 01/16/19 0149  Weight: (!) 145.2 kg (!) 142.8 kg    History of present illness:  HPI per Dr. Troy Sine [name] is a 38 y.o. male with medical history significant for type 2 diabetes, hypertension, hyperlipidemia, depression/anxiety who presented to the ED for evaluation of chest pain.  Patient stated he was in his usual state of health until around 4:30 PM on 01/15/2019.  He was resting while watching TV when he had sudden onset of left-sided dull chest discomfort with radiation to his left shoulder and left neck.  He had associated palpitations, nausea without emesis, and a ringing sensation in his ears.  He laid down and symptoms slowly subsided over 15 minutes.  He denies any associated dyspnea, cough, abdominal pain, or dysuria.  Patient states he has been out of his home blood pressure medications for at least 5 months and has not been on his diabetes medications for  at least 2 years.  He denies any similar episode in the past but reports an episode of hypertensive encephalopathy approximately 8 to 9 years ago.  ED Course:  Initial vitals showed BP 211/124, pulse 110, RR 18, temp 98.7 Fahrenheit, SPO2 99% on room air.  Labs notable for WBC 12.4, hemoglobin 15.5, platelets 340,000, potassium 3.8, bicarb 24, BUN 13, creatinine 0.9, serum glucose 325, anion gap 11, high-sensitivity troponin I 112.  SARS-CoV-2 screening test was ordered and pending.  EKG showed sinus tachycardia without acute ischemic changes.  2 view chest x-ray showed normal cardiac silhouette without focal consolidation, edema, or effusion.  Patient was given aspirin 244 mg, sublingual and topical nitroglycerin with improvement in blood pressure to 277A systolic.  Chest pain resolved.  Hospitalist service was consulted admit for further evaluation and management of chest pain.  Hospital Course:  1 chest pain Felt likely secondary to hypertensive urgency.  Patient presented with chest pain which was dull in nature left-sided with radiation to the left shoulder and left neck occurred at rest.  Patient noted on admission with blood pressure of 211/124.  Patient was given some nitroglycerin with improvement with blood pressure and chest pain.  Patient was noted not to have taken his antihypertensive medication over 5 months.  Cardiac enzymes which were cycled were negative.  EKG showed a sinus tachycardia with no acute ischemic changes.  Patient was placed back on prior home regimen of Norvasc and lisinopril.  Blood pressure improved.  Chest pain improved.  2D echo obtained with a EF of 55  to 60% with no wall motion abnormalities.  Patient noted to have multiple risk factors of poorly controlled diabetes, hypertension, hyperlipidemia.  Patient remained chest pain-free for the rest of the hospitalization improved clinically with improvement with blood pressure.  Patient was discharged home in  stable improved condition will follow-up in the outpatient setting for outpatient stress test.  Patient will be called by cardiology for an appointment.  Importance of medication compliance stressed to patient.  Outpatient follow-up.  2.  Hypertensive urgency Secondary to medication noncompliance.  Patient had been off his medications for at least 5 months.  Patient was initially given nitroglycerin with some improvement with blood pressure.  Patient was restarted on home regimen of amlodipine and lisinopril.  Patient placed on IV hydralazine as needed.  Patient's lisinopril dose increased to 20 mg daily.  Blood pressure improved such that by day of discharge blood pressure was 132/89.  Outpatient follow-up.  3.  Poorly controlled diabetes mellitus type 2 Last hemoglobin A1c noted to be 11.1 on 04/17/2018.  Patient had not been on any of his medications for several months.  Patient stated was on Lantus as well as NovoLog sliding scale insulin.  Patient was placed on Lantus 10 units daily as well as sliding scale insulin.  Blood glucose levels during the hospitalization ranged from 250 -297.  Patient will be discharged on Lantus 25 units daily as well as Humalog 8 units 3 times daily with meals.  Outpatient follow-up with PCP.  4.  Hyperlipidemia Secondary to medical noncompliance.  Patient noted to have a fasting lipid panel with a total cholesterol of 203.  Triglycerides of 466.  LDL pending.  Patient was placed back on statin of Crestor 20 mg daily.  Outpatient follow-up.  5.  Hypokalemia Repleted.   Procedures:  2D echo 01/16/2019  Chest x-ray 01/15/2019    Consultations:  Diabetes coordinator.  Discharge Exam: Vitals:   01/16/19 0447 01/16/19 1402  BP: (!) 140/99 132/89  Pulse: 87 88  Resp: 20 20  Temp: 98.1 F (36.7 C) 98.9 F (37.2 C)  SpO2: 97% 96%    General: NAD Cardiovascular: RRR Respiratory: CTAB  Discharge Instructions   Discharge Instructions    Diet - low  sodium heart healthy   Complete by: As directed    Increase activity slowly   Complete by: As directed      Allergies as of 01/16/2019      Reactions   Amoxicillin Anaphylaxis, Other (See Comments)   Has patient had a PCN reaction causing immediate rash, facial/tongue/throat swelling, SOB or lightheadedness with hypotension: yes Has patient had a PCN reaction causing severe rash involving mucus membranes or skin necrosis: no Has patient had a PCN reaction that required hospitalization yes Has patient had a PCN reaction occurring within the last 10 years: yes If all of the above answers are "NO", then may proceed with Cephalosporin use.   Penicillins Anaphylaxis, Other (See Comments)   Patient was told to list this as an allergy because he is allergic to Amoxicillin Has patient had a PCN reaction causing immediate rash, facial/tongue/throat swelling, SOB or lightheadedness with hypotension: yes for Amoxicillin Has patient had a PCN reaction causing severe rash involving mucus membranes or skin necrosis: no Has patient had a PCN reaction that required hospitalization yes for Amoxicillin Has patient had a PCN reaction occurring within the last 10 years: yes for Amoxicillin If all o   Metformin And Related Other (See Comments)   SEVERE GI UPSET  Other Other (See Comments)   Powder in some gloves - localized itching but not allergic to latex, benadryl usually helps with this reaction      Medication List    STOP taking these medications   ondansetron 4 MG disintegrating tablet Commonly known as: Zofran ODT     TAKE these medications   acetaminophen 500 MG tablet Commonly known as: TYLENOL Take 1,000 mg by mouth every 6 (six) hours as needed for mild pain.   amLODipine 10 MG tablet Commonly known as: NORVASC Take 1 tablet (10 mg total) by mouth daily.   aspirin EC 81 MG tablet Take 1 tablet (81 mg total) by mouth daily.   blood glucose meter kit and supplies Dispense based on  patient and insurance preference. Use up to four times daily as directed. (FOR ICD-10 E10.9, E11.9).   Insulin Glargine 100 UNIT/ML Solostar Pen Commonly known as: LANTUS Inject 25 Units into the skin daily.   insulin lispro 100 UNIT/ML KwikPen Commonly known as: HumaLOG KwikPen Inject 0.08 mLs (8 Units total) into the skin 3 (three) times daily.   lisinopril 20 MG tablet Commonly known as: ZESTRIL Take 1 tablet (20 mg total) by mouth daily. What changed:   medication strength  how much to take   nitroGLYCERIN 0.4 MG SL tablet Commonly known as: NITROSTAT Place 1 tablet (0.4 mg total) under the tongue every 5 (five) minutes as needed for chest pain.   rosuvastatin 20 MG tablet Commonly known as: Crestor Take 1 tablet (20 mg total) by mouth daily.      Allergies  Allergen Reactions  . Amoxicillin Anaphylaxis and Other (See Comments)    Has patient had a PCN reaction causing immediate rash, facial/tongue/throat swelling, SOB or lightheadedness with hypotension: yes Has patient had a PCN reaction causing severe rash involving mucus membranes or skin necrosis: no Has patient had a PCN reaction that required hospitalization yes Has patient had a PCN reaction occurring within the last 10 years: yes If all of the above answers are "NO", then may proceed with Cephalosporin use.  [name] Penicillins Anaphylaxis and Other (See Comments)    Patient was told to list this as an allergy because he is allergic to Amoxicillin Has patient had a PCN reaction causing immediate rash, facial/tongue/throat swelling, SOB or lightheadedness with hypotension: yes for Amoxicillin Has patient had a PCN reaction causing severe rash involving mucus membranes or skin necrosis: no Has patient had a PCN reaction that required hospitalization yes for Amoxicillin Has patient had a PCN reaction occurring within the last 10 years: yes for Amoxicillin If all o  . Metformin And Related Other (See Comments)     SEVERE GI UPSET  . Other Other (See Comments)    Powder in some gloves - localized itching but not allergic to latex, benadryl usually helps with this reaction   Follow-up Information    Horald Pollen, MD. Schedule an appointment as soon as possible for a visit in 1 week(s).   Specialty: Internal Medicine Why: f/u in 1-2 weeks Contact information: [place] [address] [place] Follow up.   Why: patient will be called for outpatient stress test evaluation. Contact information: [place] [id number] [phone]       [facility] .   Specialty: Cardiology Contact information: [address] [id number] [place] [phone]  The results of significant diagnostics from this hospitalization (including imaging, microbiology, ancillary and laboratory) are listed below for reference.    Significant Diagnostic Studies: Dg Chest 2 View  Result Date: 01/15/2019 CLINICAL DATA:  Central chest pain. EXAM: CHEST - 2 VIEW COMPARISON:  October 23, 2015 FINDINGS: Cardiomediastinal silhouette is normal. Mediastinal contours appear intact. There is no evidence of focal airspace consolidation, pleural effusion or pneumothorax. Osseous structures are without acute abnormality. Soft tissues are grossly normal. IMPRESSION: No active cardiopulmonary disease. Electronically Signed   By: Fidela Salisbury M.D.   On: 01/15/2019 18:47    Microbiology: Recent Results (from the past 240 hour(s))  SARS Coronavirus 2 [facility] order, Performed in [facility] hospital lab) Nasopharyngeal Nasopharyngeal Swab     Status: None   Collection Time: 01/15/19 11:46 PM   Specimen: Nasopharyngeal Swab  Result Value Ref Range Status   SARS Coronavirus 2 NEGATIVE NEGATIVE Final    Comment: (NOTE) If  result is NEGATIVE SARS-CoV-2 target nucleic acids are NOT DETECTED. The SARS-CoV-2 RNA is generally detectable in upper and lower  respiratory specimens during the acute phase of infection. The lowest  concentration of SARS-CoV-2 viral copies this assay can detect is 250  copies / mL. A negative result does not preclude SARS-CoV-2 infection  and should not be used as the sole basis for treatment or other  patient management decisions.  A negative result may occur with  improper specimen collection / handling, submission of specimen other  than nasopharyngeal swab, presence of viral mutation(s) within the  areas targeted by this assay, and inadequate number of viral copies  (<250 copies / mL). A negative result must be combined with clinical  observations, patient history, and epidemiological information. If result is POSITIVE SARS-CoV-2 target nucleic acids are DETECTED. The SARS-CoV-2 RNA is generally detectable in upper and lower  respiratory specimens dur ing the acute phase of infection.  Positive  results are indicative of active infection with SARS-CoV-2.  Clinical  correlation with patient history and other diagnostic information is  necessary to determine patient infection status.  Positive results do  not rule out bacterial infection or co-infection with other viruses. If result is PRESUMPTIVE POSTIVE SARS-CoV-2 nucleic acids MAY BE PRESENT.   A presumptive positive result was obtained on the submitted specimen  and confirmed on repeat testing.  While 2019 novel coronavirus  (SARS-CoV-2) nucleic acids may be present in the submitted sample  additional confirmatory testing may be necessary for epidemiological  and / or clinical management purposes  to differentiate between  SARS-CoV-2 and other Sarbecovirus currently known to infect humans.  If clinically indicated additional testing with an alternate test  methodology [phone]) is advised. The SARS-CoV-2 RNA is generally   detectable in upper and lower respiratory sp ecimens during the acute  phase of infection. The expected result is Negative. Fact Sheet for Patients:  StrictlyIdeas.no Fact Sheet for Healthcare Providers: BankingDealers.co.za This test is not yet approved or cleared by the [place] FDA and has been authorized for detection and/or diagnosis of SARS-CoV-2 by FDA under an Emergency Use Authorization (EUA).  This EUA will remain in effect (meaning this test can be used) for the duration of the COVID-19 declaration under Section 564(b)(1) of the Act, 21 U.S.C. section 360bbb-3(b)(1), unless the authorization is terminated or revoked sooner. Performed at [facility], [place] [address], [place], [place] [address]      Labs: Basic Metabolic Panel: Recent Labs  Lab 01/15/19 1826 01/16/19 9678  NA 136 138  K 3.8 3.4*  CL 101 103  CO2 24 25  GLUCOSE 325* 232*  BUN 13 13  CREATININE 0.90 0.74  CALCIUM 9.4 8.7*   Liver Function Tests: No results for input(s): AST, ALT, ALKPHOS, BILITOT, PROT, ALBUMIN in the last 168 hours. No results for input(s): LIPASE, AMYLASE in the last 168 hours. No results for input(s): AMMONIA in the last 168 hours. CBC: Recent Labs  Lab 01/15/19 1826 01/16/19 0325  WBC 12.4* 10.5  HGB 15.5 12.6*  HCT 46.5 38.7*  MCV 81.2 83.0  PLT 340 252   Cardiac Enzymes: No results for input(s): CKTOTAL, CKMB, CKMBINDEX, TROPONINI in the last 168 hours. BNP: BNP (last 3 results) No results for input(s): BNP in the last 8760 hours.  ProBNP (last 3 results) No results for input(s): PROBNP in the last 8760 hours.  CBG: Recent Labs  Lab 01/15/19 2352 01/16/19 0802 01/16/19 1140 01/16/19 1621  GLUCAP 217* 251* 297* 265*       Signed:  Irine Seal MD.  [facility] 01/16/2019, 6:58 PM

## 2019-01-16 NOTE — Progress Notes (Signed)
  Echocardiogram 2D Echocardiogram has been performed.  Bobbye Charleston 01/16/2019, 9:47 AM

## 2019-01-16 NOTE — TOC Initial Note (Signed)
Transition of Care [facility]) - Initial/Assessment Note    Patient Details  Name: [name] MRN: [id number] Date of Birth: [birth year]  [facility] CM/SW Contact:    Purcell Mouton, RN Phone Number: 01/16/2019, 10:21 AM  Clinical Narrative:                 Pt states that he will follow up with his PCP, he is able to purchase medications, he has not been taking them. Encouraged pt to take his medications and follow up with PCP.   Expected Discharge Plan: Home/Self Care     Patient Goals and CMS Choice Patient states their goals for this hospitalization and ongoing recovery are:: To follow up with PCP and take his medications.      Expected Discharge Plan and Services Expected Discharge Plan: Home/Self Care       Living arrangements for the past 2 months: Single Family Home Expected Discharge Date: 01/17/19                                    Prior Living Arrangements/Services Living arrangements for the past 2 months: Single Family Home Lives with:: Relatives Patient language and need for interpreter reviewed:: No Do you feel safe going back to the place where you live?: Yes               Activities of Daily Living Home Assistive Devices/Equipment: None ADL Screening (condition at time of admission) Patient's cognitive ability adequate to safely complete daily activities?: Yes Is the patient deaf or have difficulty hearing?: No Does the patient have difficulty seeing, even when wearing glasses/contacts?: No Does the patient have difficulty concentrating, remembering, or making decisions?: No Patient able to express need for assistance with ADLs?: Yes Does the patient have difficulty dressing or bathing?: No Independently performs ADLs?: Yes (appropriate for developmental age) Does the patient have difficulty walking or climbing stairs?: No Weakness of Legs: None Weakness of Arms/Hands: None  Permission Sought/Granted Permission sought  to share information with : Case Manager                Emotional Assessment Appearance:: Appears stated age     Orientation: : Oriented to Self, Oriented to Place, Oriented to  Time, Oriented to Situation      Admission diagnosis:  Hypertensive urgency [I16.0] Chest pain in adult [R07.9] Patient Active Problem List   Diagnosis Date Noted  . Chest pain 01/15/2019  . Essential hypertension 04/17/2018  . Dyslipidemia 04/17/2018  . PTSD (post-traumatic stress disorder) 08/11/2016  . Spinal stenosis, lumbar region with neurogenic claudication 05/05/2016  . Left epididymitis 03/16/2016  . Dysthymia 09/02/2015  . Chronic low back pain 09/02/2015  . Uncontrolled type 2 diabetes mellitus without complication ([place]) [id number]  . UNSPECIFIED ANEMIA 03/11/2008   PCP:  Horald Pollen, MD Pharmacy:   [id number] - , [place] [address] Phone: [phone] Fax: [phone]     Social Determinants of Health (SDOH) Interventions    Readmission Risk Interventions No flowsheet data found.

## 2019-01-17 LAB — HEMOGLOBIN A1C
Hgb A1c MFr Bld: 11.2 % — ABNORMAL HIGH (ref 4.8–5.6)
Mean Plasma Glucose: 275 mg/dL

## 2019-01-17 NOTE — Progress Notes (Signed)
Needs office visit for follow-up.  Thanks.

## 2019-01-23 ENCOUNTER — Telehealth: Payer: Self-pay | Admitting: Emergency Medicine

## 2019-01-23 NOTE — Telephone Encounter (Signed)
PCPCopied from [place] [phone]. Topic: General - Other >> Jan 22, 2019  4:52 PM Sheran Luz wrote: Patient calling to state he is needing some pen needles. He was released from hospital on 9/7 with the pens but without needles.

## 2019-01-30 ENCOUNTER — Other Ambulatory Visit: Payer: Self-pay

## 2019-01-30 ENCOUNTER — Encounter: Payer: Self-pay | Admitting: Gastroenterology

## 2019-01-30 ENCOUNTER — Encounter: Payer: Self-pay | Admitting: Emergency Medicine

## 2019-01-30 ENCOUNTER — Ambulatory Visit (AMBULATORY_SURGERY_CENTER): Payer: Self-pay

## 2019-01-30 ENCOUNTER — Ambulatory Visit: Payer: 59 | Admitting: Emergency Medicine

## 2019-01-30 VITALS — Temp 96.0°F | Ht 75.0 in | Wt 320.0 lb

## 2019-01-30 VITALS — BP 143/90 | HR 99 | Temp 98.7°F | Resp 16 | Ht 75.0 in | Wt 318.4 lb

## 2019-01-30 DIAGNOSIS — I152 Hypertension secondary to endocrine disorders: Secondary | ICD-10-CM

## 2019-01-30 DIAGNOSIS — E1159 Type 2 diabetes mellitus with other circulatory complications: Secondary | ICD-10-CM | POA: Diagnosis not present

## 2019-01-30 DIAGNOSIS — K625 Hemorrhage of anus and rectum: Secondary | ICD-10-CM

## 2019-01-30 DIAGNOSIS — E1165 Type 2 diabetes mellitus with hyperglycemia: Secondary | ICD-10-CM

## 2019-01-30 DIAGNOSIS — E785 Hyperlipidemia, unspecified: Secondary | ICD-10-CM

## 2019-01-30 DIAGNOSIS — IMO0001 Reserved for inherently not codable concepts without codable children: Secondary | ICD-10-CM

## 2019-01-30 DIAGNOSIS — I10 Essential (primary) hypertension: Secondary | ICD-10-CM | POA: Diagnosis not present

## 2019-01-30 DIAGNOSIS — E1169 Type 2 diabetes mellitus with other specified complication: Secondary | ICD-10-CM

## 2019-01-30 LAB — POCT GLYCOSYLATED HEMOGLOBIN (HGB A1C): Hemoglobin A1C: 11.7 % — AB (ref 4.0–5.6)

## 2019-01-30 LAB — GLUCOSE, POCT (MANUAL RESULT ENTRY): POC Glucose: 290 mg/dl — AB (ref 70–99)

## 2019-01-30 MED ORDER — TRULICITY 1.5 MG/0.5ML ~~LOC~~ SOAJ: 1.5000 mg | SUBCUTANEOUS | 10 refills | Status: AC

## 2019-01-30 MED ORDER — GLIPIZIDE 5 MG PO TABS: 5.0000 mg | ORAL_TABLET | Freq: Two times a day (BID) | ORAL | 1 refills | Status: DC

## 2019-01-30 MED ORDER — NA SULFATE-K SULFATE-MG SULF 17.5-3.13-1.6 GM/177ML PO SOLN: 1.0000 | Freq: Once | ORAL | 0 refills | Status: AC

## 2019-01-30 NOTE — Assessment & Plan Note (Signed)
Uncontrolled diabetes with hemoglobin A1c at 11.7.  Patient is intolerant to metformin and does not like taking insulin.  Will start glipizide 5 mg twice a day and Trulicity 1.5 mg weekly.  Diet and nutrition discussed with patient as well as weight goal.  Follow-up in 4 weeks. Blood pressure slightly elevated.  We will continue both amlodipine and lisinopril.  Normal most recent renal function.  Follow-up in 4 weeks.

## 2019-01-30 NOTE — Progress Notes (Signed)
Denies allergies to eggs or soy products. Denies complication of anesthesia or sedation. Denies use of weight loss medication. Denies use of O2.   Emmi instructions given for colonoscopy.  A 15.00 coupon for Suprep was given to the patient.  

## 2019-01-30 NOTE — Patient Instructions (Addendum)
   If you have lab work done today you will be contacted with your lab results within the next 2 weeks.  If you have not heard from us then please contact us. The fastest way to get your results is to register for My Chart.   IF you received an x-ray today, you will receive an invoice from [facility]. Please contact [facility] at [phone] with questions or concerns regarding your invoice.   IF you received labwork today, you will receive an invoice from [facility]. Please contact [facility] at [phone] with questions or concerns regarding your invoice.   Our billing staff will not be able to assist you with questions regarding bills from these companies.  You will be contacted with the lab results as soon as they are available. The fastest way to get your results is to activate your My Chart account. Instructions are located on the last page of this paperwork. If you have not heard from us regarding the results in 2 weeks, please contact this office.     Diabetes Mellitus and Nutrition, Adult When you have diabetes (diabetes mellitus), it is very important to have healthy eating habits because your blood sugar (glucose) levels are greatly affected by what you eat and drink. Eating healthy foods in the appropriate amounts, at about the same times every day, can help you:  Control your blood glucose.  Lower your risk of heart disease.  Improve your blood pressure.  Reach or maintain a healthy weight. Every person with diabetes is different, and each person has different needs for a meal plan. Your health care provider may recommend that you work with a diet and nutrition specialist (dietitian) to make a meal plan that is best for you. Your meal plan may vary depending on factors such as:  The calories you need.  The medicines you take.  Your weight.  Your blood glucose, blood pressure, and cholesterol levels.  Your activity level.  Other health conditions  you have, such as heart or kidney disease. How do carbohydrates affect me? Carbohydrates, also called carbs, affect your blood glucose level more than any other type of food. Eating carbs naturally raises the amount of glucose in your blood. Carb counting is a method for keeping track of how many carbs you eat. Counting carbs is important to keep your blood glucose at a healthy level, especially if you use insulin or take certain oral diabetes medicines. It is important to know how many carbs you can safely have in each meal. This is different for every person. Your dietitian can help you calculate how many carbs you should have at each meal and for each snack. Foods that contain carbs include:  Bread, cereal, rice, pasta, and crackers.  Potatoes and corn.  Peas, beans, and lentils.  Milk and yogurt.  Fruit and juice.  Desserts, such as cakes, cookies, ice cream, and candy. How does alcohol affect me? Alcohol can cause a sudden decrease in blood glucose (hypoglycemia), especially if you use insulin or take certain oral diabetes medicines. Hypoglycemia can be a life-threatening condition. Symptoms of hypoglycemia (sleepiness, dizziness, and confusion) are similar to symptoms of having too much alcohol. If your health care provider says that alcohol is safe for you, follow these guidelines:  Limit alcohol intake to no more than 1 drink per day for nonpregnant women and 2 drinks per day for men. One drink equals 12 oz of beer, 5 oz of wine, or 1 oz of hard liquor.    Do not drink on an empty stomach.  Keep yourself hydrated with water, diet soda, or unsweetened iced tea.  Keep in mind that regular soda, juice, and other mixers may contain a lot of sugar and must be counted as carbs. What are tips for following this plan?  Reading food labels  Start by checking the serving size on the "Nutrition Facts" label of packaged foods and drinks. The amount of calories, carbs, fats, and other  nutrients listed on the label is based on one serving of the item. Many items contain more than one serving per package.  Check the total grams (g) of carbs in one serving. You can calculate the number of servings of carbs in one serving by dividing the total carbs by 15. For example, if a food has 30 g of total carbs, it would be equal to 2 servings of carbs.  Check the number of grams (g) of saturated and trans fats in one serving. Choose foods that have low or no amount of these fats.  Check the number of milligrams (mg) of salt (sodium) in one serving. Most people should limit total sodium intake to less than 2,300 mg per day.  Always check the nutrition information of foods labeled as "low-fat" or "nonfat". These foods may be higher in added sugar or refined carbs and should be avoided.  Talk to your dietitian to identify your daily goals for nutrients listed on the label. Shopping  Avoid buying canned, premade, or processed foods. These foods tend to be high in fat, sodium, and added sugar.  Shop around the outside edge of the grocery store. This includes fresh fruits and vegetables, bulk grains, fresh meats, and fresh dairy. Cooking  Use low-heat cooking methods, such as baking, instead of high-heat cooking methods like deep frying.  Cook using healthy oils, such as olive, canola, or sunflower oil.  Avoid cooking with butter, cream, or high-fat meats. Meal planning  Eat meals and snacks regularly, preferably at the same times every day. Avoid going long periods of time without eating.  Eat foods high in fiber, such as fresh fruits, vegetables, beans, and whole grains. Talk to your dietitian about how many servings of carbs you can eat at each meal.  Eat 4-6 ounces (oz) of lean protein each day, such as lean meat, chicken, fish, eggs, or tofu. One oz of lean protein is equal to: ? 1 oz of meat, chicken, or fish. ? 1 egg. ?  cup of tofu.  Eat some foods each day that contain  healthy fats, such as avocado, nuts, seeds, and fish. Lifestyle  Check your blood glucose regularly.  Exercise regularly as told by your health care provider. This may include: ? 150 minutes of moderate-intensity or vigorous-intensity exercise each week. This could be brisk walking, biking, or water aerobics. ? Stretching and doing strength exercises, such as yoga or weightlifting, at least 2 times a week.  Take medicines as told by your health care provider.  Do not use any products that contain nicotine or tobacco, such as cigarettes and e-cigarettes. If you need help quitting, ask your health care provider.  Work with a counselor or diabetes educator to identify strategies to manage stress and any emotional and social challenges. Questions to ask a health care provider  Do I need to meet with a diabetes educator?  Do I need to meet with a dietitian?  What number can I call if I have questions?  When are the best times to   check my blood glucose? Where to find more information:  American Diabetes Association: diabetes.org  Academy of Nutrition and Dietetics: www.eatright.org  National Institute of Diabetes and Digestive and Kidney Diseases (NIH): www.niddk.nih.gov Summary  A healthy meal plan will help you control your blood glucose and maintain a healthy lifestyle.  Working with a diet and nutrition specialist (dietitian) can help you make a meal plan that is best for you.  Keep in mind that carbohydrates (carbs) and alcohol have immediate effects on your blood glucose levels. It is important to count carbs and to use alcohol carefully. This information is not intended to replace advice given to you by your health care provider. Make sure you discuss any questions you have with your health care provider. Document Released: 01/21/2005 Document Revised: 04/08/2017 Document Reviewed: 05/31/2016 [employer] Patient Education  2020 [employer]  

## 2019-01-30 NOTE — Progress Notes (Signed)
BP Readings from Last 3 Encounters:  01/16/19 132/89  09/29/18 (!) 148/98  09/18/18 117/65   Lab Results  Component Value Date   HGBA1C 11.2 (H) 01/15/2019   Lab Results  Component Value Date   CREATININE 0.74 01/16/2019   BUN 13 01/16/2019   NA 138 01/16/2019   K 3.4 (L) 01/16/2019   CL 103 01/16/2019   CO2 25 01/16/2019  Admit date: 01/15/2019 Discharge date: 01/16/2019  Time spent: 50 minutes  Recommendations for Outpatient Follow-up:  1. Follow-up with Horald Pollen, MD in 1 to 2 weeks.  On follow-up patient's diabetes will need to be reassessed.  Patient will need a basic metabolic profile done to follow-up on electrolytes and renal function.  Patient's blood pressure needs to be reassessed.  Importance of compliance was stressed to patient. 2. Follow-up with  [facility], cardiology.  Patient will be called with an appointment time for outpatient stress test.     Discharge Diagnoses:  Principal Problem:   Chest pain Active Problems:   Uncontrolled type 2 diabetes mellitus without complication ([place])   Essential hypertension   Dyslipidemia   Chest pain in adult   Hypertensive urgency   Hypokalemia   Discharge Condition: Stable and improved  Diet recommendation: Heart healthy      Filed Weights   01/15/19 1817 01/16/19 0149  Weight: (!) 145.2 kg (!) 142.8 kg    History of present illness:  HPI per Dr. Earnestine Leys Reeseis a 38 y.o.malewith medical history significant fortype 2 diabetes, hypertension, hyperlipidemia,depression/anxiety who presented to the ED for evaluation of chest pain.Patient stated he was in his usual state of health until around 4:30 PM on 01/15/2019. He was resting while watching TV when he had sudden onset of left-sided dull chest discomfort with radiation to his left shoulder and left neck. He had associated palpitations, nausea without emesis, and a ringing sensation in his ears. He laid down and symptoms slowly subsided  over 15 minutes. He denies any associated dyspnea, cough, abdominal pain, or dysuria.  Patient states he has been out of his home blood pressure medications for at least 5 months and has not been on his diabetes medications for at least 2 years. He denies any similar episode in the past but reports an episode of hypertensive encephalopathy approximately 8 to 9 years ago.  ED Course: Initial vitals showed BP 211/124, pulse 110, RR 18, temp 98.7 Fahrenheit, SPO2 99% on room air.  Labs notable for WBC 12.4, hemoglobin 15.5, platelets 340,000, potassium 3.8, bicarb 24, BUN 13, creatinine 0.9, serum glucose 325, anion gap 11, high-sensitivity troponin I 112.  SARS-CoV-2 screening test was ordered and pending.  EKG showed sinus tachycardia without acute ischemic changes. 2 view chest x-ray showed normal cardiac silhouette without focal consolidation, edema, or effusion.  Patient was given aspirin 244 mg,sublingual and topical nitroglycerin with improvement in blood pressure to 654Y systolic. Chest pain resolved. Hospitalist service was consulted admit for further evaluation and management of chest pain.  Hospital Course:  1 chest pain Felt likely secondary to hypertensive urgency.  Patient presented with chest pain which was dull in nature left-sided with radiation to the left shoulder and left neck occurred at rest.  Patient noted on admission with blood pressure of 211/124.  Patient was given some nitroglycerin with improvement with blood pressure and chest pain.  Patient was noted not to have taken his antihypertensive medication over 5 months.  Cardiac enzymes which were cycled were negative.  EKG showed  a sinus tachycardia with no acute ischemic changes.  Patient was placed back on prior home regimen of Norvasc and lisinopril.  Blood pressure improved.  Chest pain improved.  2D echo obtained with a EF of 55 to 60% with no wall motion abnormalities.  Patient noted to have multiple risk  factors of poorly controlled diabetes, hypertension, hyperlipidemia.  Patient remained chest pain-free for the rest of the hospitalization improved clinically with improvement with blood pressure.  Patient was discharged home in stable improved condition will follow-up in the outpatient setting for outpatient stress test.  Patient will be called by cardiology for an appointment.  Importance of medication compliance stressed to patient.  Outpatient follow-up.  2.  Hypertensive urgency Secondary to medication noncompliance.  Patient had been off his medications for at least 5 months.  Patient was initially given nitroglycerin with some improvement with blood pressure.  Patient was restarted on home regimen of amlodipine and lisinopril.  Patient placed on IV hydralazine as needed.  Patient's lisinopril dose increased to 20 mg daily.  Blood pressure improved such that by day of discharge blood pressure was 132/89.  Outpatient follow-up.  3.  Poorly controlled diabetes mellitus type 2 Last hemoglobin A1c noted to be 11.1 on 04/17/2018.  Patient had not been on any of his medications for several months.  Patient stated was on Lantus as well as NovoLog sliding scale insulin.  Patient was placed on Lantus 10 units daily as well as sliding scale insulin.  Blood glucose levels during the hospitalization ranged from 250 -297.  Patient will be discharged on Lantus 25 units daily as well as Humalog 8 units 3 times daily with meals.  Outpatient follow-up with PCP.  4.  Hyperlipidemia Secondary to medical noncompliance.  Patient noted to have a fasting lipid panel with a total cholesterol of 203.  Triglycerides of 466.  LDL pending.  Patient was placed back on statin of Crestor 20 mg daily.  Outpatient follow-up.  5.  Hypokalemia Repleted.   [name] 38 y.o.   Chief Complaint  Patient presents with   Hypertension    hospital follow up  01/15/2019 and chest pain    HISTORY OF PRESENT  ILLNESS: This is a 38 y.o. male with history of hypertension diabetes here for hospital stay follow-up.  Was admitted on 01/15/2019 with hypertensive urgency and chest pain.  Uncontrolled diabetes.  Has been noncompliant with medications.  My last visit was on 04/17/2018 with uncontrolled diabetes.  Patient was started on glipizide 5 mg twice a day and Trulicity once a week.  Patient did not show up for follow-up.  States he took Trulicity without bad reactions but then lost his medical insurance and was unable to follow-up.  During recent hospital stay he was started on insulin but he does not like injecting himself 3 and 4 times a day.  Denies chest pain or difficulty breathing today.  Asymptomatic.  HPI   Prior to Admission medications   Medication Sig Start Date End Date Taking? Authorizing Provider  acetaminophen (TYLENOL) 500 MG tablet Take 1,000 mg by mouth every 6 (six) hours as needed for mild pain.   Yes [provider]  amLODipine (NORVASC) 10 MG tablet Take 1 tablet (10 mg total) by mouth daily. 01/16/19  Yes Eugenie Filler, MD  aspirin EC 81 MG tablet Take 1 tablet (81 mg total) by mouth daily. 07/11/14  Yes Shawnee Knapp, MD  doxylamine, Sleep, (UNISOM) 25 MG tablet Take 25 mg  by mouth at bedtime as needed.   Yes [provider]  Insulin Glargine (LANTUS) 100 UNIT/ML Solostar Pen Inject 25 Units into the skin daily. 01/16/19  Yes Eugenie Filler, MD  insulin lispro (HUMALOG KWIKPEN) 100 UNIT/ML KwikPen Inject 0.08 mLs (8 Units total) into the skin 3 (three) times daily. 01/16/19  Yes Eugenie Filler, MD  lisinopril (ZESTRIL) 20 MG tablet Take 1 tablet (20 mg total) by mouth daily. 01/16/19  Yes Eugenie Filler, MD  loratadine (CLARITIN) 10 MG tablet Take 10 mg by mouth as needed for allergies.   Yes [provider]  Na Sulfate-K Sulfate-Mg Sulf 17.5-3.13-1.6 GM/177ML SOLN Take 1 kit by mouth once for 1 dose. 01/30/19 01/30/19 Yes [place], MD   rosuvastatin (CRESTOR) 20 MG tablet Take 1 tablet (20 mg total) by mouth daily. 01/16/19  Yes Eugenie Filler, MD  blood glucose meter kit and supplies Dispense based on patient and insurance preference. Use up to four times daily as directed. (FOR ICD-10 E10.9, E11.9). 01/16/19   Eugenie Filler, MD  nitroGLYCERIN (NITROSTAT) 0.4 MG SL tablet Place 1 tablet (0.4 mg total) under the tongue every 5 (five) minutes as needed for chest pain. Patient not taking: Reported on 01/30/2019 01/16/19   Eugenie Filler, MD    Allergies  Allergen Reactions   Amoxicillin Anaphylaxis and Other (See Comments)    Has patient had a PCN reaction causing immediate rash, facial/tongue/throat swelling, SOB or lightheadedness with hypotension: yes Has patient had a PCN reaction causing severe rash involving mucus membranes or skin necrosis: no Has patient had a PCN reaction that required hospitalization yes Has patient had a PCN reaction occurring within the last 10 years: yes If all of the above answers are "NO", then may proceed with Cephalosporin use.   Penicillins Anaphylaxis and Other (See Comments)    Patient was told to list this as an allergy because he is allergic to Amoxicillin Has patient had a PCN reaction causing immediate rash, facial/tongue/throat swelling, SOB or lightheadedness with hypotension: yes for Amoxicillin Has patient had a PCN reaction causing severe rash involving mucus membranes or skin necrosis: no Has patient had a PCN reaction that required hospitalization yes for Amoxicillin Has patient had a PCN reaction occurring within the last 10 years: yes for Amoxicillin If all o   Metformin And Related Other (See Comments)    SEVERE GI UPSET   Other Other (See Comments)    Powder in some gloves - localized itching but not allergic to latex, benadryl usually helps with this reaction    Patient Active Problem List   Diagnosis Date Noted   Essential hypertension 04/17/2018    Dyslipidemia 04/17/2018   PTSD (post-traumatic stress disorder) 08/11/2016   Spinal stenosis, lumbar region with neurogenic claudication 05/05/2016   Dysthymia 09/02/2015   Chronic low back pain 09/02/2015   Uncontrolled type 2 diabetes mellitus without complication ([place]) 29/92/4268   UNSPECIFIED ANEMIA 03/11/2008    Past Medical History:  Diagnosis Date   Allergies    Allergy    Anemia    Anxiety    Depression    Diabetes mellitus    GERD (gastroesophageal reflux disease)    Hyperlipidemia    Hypertension    Lumbar disc herniation    Neuromuscular disorder (HCC)    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    PTSD (post-traumatic stress disorder)    Scoliosis    Sleep apnea     Past Surgical History:  Procedure  Laterality Date   LUMBAR LAMINECTOMY/DECOMPRESSION MICRODISCECTOMY Left 05/05/2016   Procedure: CENTRAL DECOMPRESSION L4-L5 AND FORAMINOTOMY FOR L4 ROOT AND L5 ROOT ON THE LEFT;  Surgeon: Latanya Maudlin, MD;  Location: [facility];  Service: Orthopedics;  Laterality: Left;   NO PAST SURGERIES      Social History   Socioeconomic History   Marital status: Married    Spouse name: Not on file   Number of children: Not on file   Years of education: Not on file   Highest education level: Not on file  Occupational History   Not on file  Social Needs   Financial resource strain: Not on file   Food insecurity    Worry: Not on file    Inability: Not on file   Transportation needs    Medical: Not on file    Non-medical: Not on file  Tobacco Use   Smoking status: Former Smoker    Types: Cigars   Smokeless tobacco: Current User    Types: Snuff, Chew  Substance and Sexual Activity   Alcohol use: Yes    Alcohol/week: 5.0 standard drinks    Types: 5 Shots of liquor per week    Comment: few times year   Drug use: No   Sexual activity: Not on file  Lifestyle   Physical activity    Days per week: Not on file    Minutes per session: Not on  file   Stress: Not on file  Relationships   Social connections    Talks on phone: Not on file    Gets together: Not on file    Attends religious service: Not on file    Active member of club or organization: Not on file    Attends meetings of clubs or organizations: Not on file    Relationship status: Not on file   Intimate partner violence    Fear of current or ex partner: Not on file    Emotionally abused: Not on file    Physically abused: Not on file    Forced sexual activity: Not on file  Other Topics Concern   Not on file  Social History Narrative   Not on file    Family History  Problem Relation Age of Onset   Stroke Maternal Grandmother    Colon cancer Neg Hx    Esophageal cancer Neg Hx    Rectal cancer Neg Hx    Stomach cancer Neg Hx      Review of Systems  Constitutional: Negative.  Negative for chills and fever.  HENT: Negative.  Negative for congestion and sore throat.   Eyes: Negative.  Negative for blurred vision and double vision.  Respiratory: Negative.  Negative for cough and shortness of breath.   Cardiovascular: Negative.  Negative for chest pain and palpitations.  Gastrointestinal: Negative.  Negative for abdominal pain and vomiting.  Genitourinary: Negative.  Negative for dysuria and hematuria.  Musculoskeletal: Negative.  Negative for myalgias.  Skin: Negative.  Negative for rash.  Neurological: Negative.  Negative for dizziness and headaches.  Endo/Heme/Allergies: Negative.   All other systems reviewed and are negative.  Vitals:   01/30/19 1134  BP: (!) 143/90  Pulse: 99  Resp: 16  Temp: 98.7 F (37.1 C)  SpO2: 96%      Physical Exam Vitals signs reviewed.  Constitutional:      Appearance: He is obese.  HENT:     Head: Normocephalic.  Eyes:     Extraocular Movements: Extraocular movements intact.  Conjunctiva/sclera: Conjunctivae normal.     Pupils: Pupils are equal, round, and reactive to light.  Neck:      Musculoskeletal: Normal range of motion and neck supple.  Cardiovascular:     Rate and Rhythm: Normal rate and regular rhythm.     Pulses: Normal pulses.     Heart sounds: Normal heart sounds.  Pulmonary:     Effort: Pulmonary effort is normal.     Breath sounds: Normal breath sounds.  Abdominal:     General: There is no distension.     Palpations: Abdomen is soft.     Tenderness: There is no abdominal tenderness.  Musculoskeletal: Normal range of motion.  Skin:    General: Skin is warm and dry.     Capillary Refill: Capillary refill takes less than 2 seconds.  Neurological:     General: No focal deficit present.     Mental Status: He is alert and oriented to person, place, and time.  Psychiatric:        Mood and Affect: Mood normal.        Behavior: Behavior normal.    Results for orders placed or performed in visit on 01/30/19 (from the past 24 hour(s))  POCT glucose (manual entry)     Status: Abnormal   Collection Time: 01/30/19 12:11 PM  Result Value Ref Range   POC Glucose 290 (A) 70 - 99 mg/dl  POCT glycosylated hemoglobin (Hb A1C)     Status: Abnormal   Collection Time: 01/30/19 12:16 PM  Result Value Ref Range   Hemoglobin A1C 11.7 (A) 4.0 - 5.6 %   HbA1c POC (<> result, manual entry)     HbA1c, POC (prediabetic range)     HbA1c, POC (controlled diabetic range)       ASSESSMENT & PLAN: A total of 45 minutes was spent in the room with the patient, greater than 50% of which was in counseling/coordination of care regarding hypertension and diabetes and cardiovascular risk associated with these conditions, diet and nutrition, new medications and doses, side effects including hypoglycemic symptoms, importance of physical activity, prognosis, review of recent blood work and medical records, and need for follow-up.  Hypertension associated with diabetes ([place]) Uncontrolled diabetes with hemoglobin A1c at 11.7.  Patient is intolerant to metformin and does not like taking  insulin.  Will start glipizide 5 mg twice a day and Trulicity 1.5 mg weekly.  Diet and nutrition discussed with patient as well as weight goal.  Follow-up in 4 weeks. Blood pressure slightly elevated.  We will continue both amlodipine and lisinopril.  Normal most recent renal function.  Follow-up in 4 weeks.  Burle was seen today for hypertension.  Diagnoses and all orders for this visit:  Hypertension associated with diabetes ([place]) -     Comprehensive metabolic panel -     glipiZIDE (GLUCOTROL) 5 MG tablet; Take 1 tablet (5 mg total) by mouth 2 (two) times daily before a meal. -     Dulaglutide (TRULICITY) 1.5 GY/5.6LS SOPN; Inject 1.5 mg into the skin once a week.  Uncontrolled type 2 diabetes mellitus without complication ([facility]) -     POCT glucose (manual entry) -     POCT glycosylated hemoglobin (Hb A1C)  Dyslipidemia  Essential hypertension  Dyslipidemia associated with type 2 diabetes mellitus ([place]) -     Lipid panel    Patient Instructions       If you have lab work done today you will be contacted with your lab  results within the next 2 weeks.  If you have not heard from [place] then please contact us. The fastest way to get your results is to register for My Chart.   IF you received an x-ray today, you will receive an invoice from [facility]. Please contact [facility] at [phone] with questions or concerns regarding your invoice.   IF you received labwork today, you will receive an invoice from [facility]. Please contact [facility] at [phone] with questions or concerns regarding your invoice.   Our billing staff will not be able to assist you with questions regarding bills from these companies.  You will be contacted with the lab results as soon as they are available. The fastest way to get your results is to activate your My Chart account. Instructions are located on the last page of this paperwork. If you have not heard from [place] regarding the  results in 2 weeks, please contact this office.     Diabetes Mellitus and Nutrition, Adult When you have diabetes (diabetes mellitus), it is very important to have healthy eating habits because your blood sugar (glucose) levels are greatly affected by what you eat and drink. Eating healthy foods in the appropriate amounts, at about the same times every day, can help you:  Control your blood glucose.  Lower your risk of heart disease.  Improve your blood pressure.  Reach or maintain a healthy weight. Every person with diabetes is different, and each person has different needs for a meal plan. Your health care provider may recommend that you work with a diet and nutrition specialist (dietitian) to make a meal plan that is best for you. Your meal plan may vary depending on factors such as:  The calories you need.  The medicines you take.  Your weight.  Your blood glucose, blood pressure, and cholesterol levels.  Your activity level.  Other health conditions you have, such as heart or kidney disease. How do carbohydrates affect me? Carbohydrates, also called carbs, affect your blood glucose level more than any other type of food. Eating carbs naturally raises the amount of glucose in your blood. Carb counting is a method for keeping track of how many carbs you eat. Counting carbs is important to keep your blood glucose at a healthy level, especially if you use insulin or take certain oral diabetes medicines. It is important to know how many carbs you can safely have in each meal. This is different for every person. Your dietitian can help you calculate how many carbs you should have at each meal and for each snack. Foods that contain carbs include:  Bread, cereal, rice, pasta, and crackers.  Potatoes and corn.  Peas, beans, and lentils.  Milk and yogurt.  Fruit and juice.  Desserts, such as cakes, cookies, ice cream, and candy. How does alcohol affect me? Alcohol can cause a  sudden decrease in blood glucose (hypoglycemia), especially if you use insulin or take certain oral diabetes medicines. Hypoglycemia can be a life-threatening condition. Symptoms of hypoglycemia (sleepiness, dizziness, and confusion) are similar to symptoms of having too much alcohol. If your health care provider says that alcohol is safe for you, follow these guidelines:  Limit alcohol intake to no more than 1 drink per day for nonpregnant women and 2 drinks per day for men. One drink equals 12 oz of beer, 5 oz of wine, or 1 oz of hard liquor.  Do not drink on an empty stomach.  Keep yourself hydrated with water, diet soda,  or unsweetened iced tea.  Keep in mind that regular soda, juice, and other mixers may contain a lot of sugar and must be counted as carbs. What are tips for following this plan?  Reading food labels  Start by checking the serving size on the "Nutrition Facts" label of packaged foods and drinks. The amount of calories, carbs, fats, and other nutrients listed on the label is based on one serving of the item. Many items contain more than one serving per package.  Check the total grams (g) of carbs in one serving. You can calculate the number of servings of carbs in one serving by dividing the total carbs by 15. For example, if a food has 30 g of total carbs, it would be equal to 2 servings of carbs.  Check the number of grams (g) of saturated and trans fats in one serving. Choose foods that have low or no amount of these fats.  Check the number of milligrams (mg) of salt (sodium) in one serving. Most people should limit total sodium intake to less than 2,300 mg per day.  Always check the nutrition information of foods labeled as "low-fat" or "nonfat". These foods may be higher in added sugar or refined carbs and should be avoided.  Talk to your dietitian to identify your daily goals for nutrients listed on the label. Shopping  Avoid buying canned, premade, or processed  foods. These foods tend to be high in fat, sodium, and added sugar.  Shop around the outside edge of the grocery store. This includes fresh fruits and vegetables, bulk grains, fresh meats, and fresh dairy. Cooking  Use low-heat cooking methods, such as baking, instead of high-heat cooking methods like deep frying.  Cook using healthy oils, such as olive, canola, or sunflower oil.  Avoid cooking with butter, cream, or high-fat meats. Meal planning  Eat meals and snacks regularly, preferably at the same times every day. Avoid going long periods of time without eating.  Eat foods high in fiber, such as fresh fruits, vegetables, beans, and whole grains. Talk to your dietitian about how many servings of carbs you can eat at each meal.  Eat 4-6 ounces (oz) of lean protein each day, such as lean meat, chicken, fish, eggs, or tofu. One oz of lean protein is equal to: ? 1 oz of meat, chicken, or fish. ? 1 egg. ?  cup of tofu.  Eat some foods each day that contain healthy fats, such as avocado, nuts, seeds, and fish. Lifestyle  Check your blood glucose regularly.  Exercise regularly as told by your health care provider. This may include: ? 150 minutes of moderate-intensity or vigorous-intensity exercise each week. This could be brisk walking, biking, or water aerobics. ? Stretching and doing strength exercises, such as yoga or weightlifting, at least 2 times a week.  Take medicines as told by your health care provider.  Do not use any products that contain nicotine or tobacco, such as cigarettes and e-cigarettes. If you need help quitting, ask your health care provider.  Work with a Social worker or diabetes educator to identify strategies to manage stress and any emotional and social challenges. Questions to ask a health care provider  Do I need to meet with a diabetes educator?  Do I need to meet with a dietitian?  What number can I call if I have questions?  When are the best times  to check my blood glucose? Where to find more information:  American Diabetes Association: diabetes.org  Academy of Nutrition and Dietetics: www.eatright.[employer] of Diabetes and Digestive and Kidney Diseases (NIH): [employer] Summary  A healthy meal plan will help you control your blood glucose and maintain a healthy lifestyle.  Working with a diet and nutrition specialist (dietitian) can help you make a meal plan that is best for you.  Keep in mind that carbohydrates (carbs) and alcohol have immediate effects on your blood glucose levels. It is important to count carbs and to use alcohol carefully. This information is not intended to replace advice given to you by your health care provider. Make sure you discuss any questions you have with your health care provider. Document Released: 01/21/2005 Document Revised: 04/08/2017 Document Reviewed: 05/31/2016 Elsevier Patient Education  2020 Elsevier Inc.     Agustina Caroli, MD [facility]

## 2019-01-31 ENCOUNTER — Encounter: Payer: Self-pay | Admitting: Emergency Medicine

## 2019-01-31 LAB — COMPREHENSIVE METABOLIC PANEL
ALT: 31 IU/L (ref 0–44)
AST: 15 IU/L (ref 0–40)
Albumin/Globulin Ratio: 1.7 (ref 1.2–2.2)
Albumin: 4.5 g/dL (ref 4.0–5.0)
Alkaline Phosphatase: 79 IU/L (ref 39–117)
BUN/Creatinine Ratio: 11 (ref 9–20)
BUN: 9 mg/dL (ref 6–20)
Bilirubin Total: 0.4 mg/dL (ref 0.0–1.2)
CO2: 23 mmol/L (ref 20–29)
Calcium: 9.4 mg/dL (ref 8.7–10.2)
Chloride: 101 mmol/L (ref 96–106)
Creatinine, Ser: 0.84 mg/dL (ref 0.76–1.27)
GFR calc Af Amer: 130 mL/min/{1.73_m2} (ref >59)
GFR calc non Af Amer: 113 mL/min/{1.73_m2} (ref >59)
Globulin, Total: 2.7 g/dL (ref 1.5–4.5)
Glucose: 292 mg/dL — ABNORMAL HIGH (ref 65–99)
Potassium: 4.2 mmol/L (ref 3.5–5.2)
Sodium: 139 mmol/L (ref 134–144)
Total Protein: 7.2 g/dL (ref 6.0–8.5)

## 2019-01-31 LAB — LIPID PANEL
Chol/HDL Ratio: 4.4 ratio (ref 0.0–5.0)
Cholesterol, Total: 180 mg/dL (ref 100–199)
HDL: 41 mg/dL (ref >39)
LDL Chol Calc (NIH): 108 mg/dL — ABNORMAL HIGH (ref 0–99)
Triglycerides: 174 mg/dL — ABNORMAL HIGH (ref 0–149)
VLDL Cholesterol Cal: 31 mg/dL (ref 5–40)

## 2019-02-01 ENCOUNTER — Ambulatory Visit: Payer: 59 | Admitting: Cardiology

## 2019-02-01 ENCOUNTER — Telehealth: Payer: Self-pay | Admitting: *Deleted

## 2019-02-01 NOTE — Telephone Encounter (Signed)
Called left message in voice mail to call back with the name of glucose monitor, so lancets can be ordered.

## 2019-02-01 NOTE — Progress Notes (Deleted)
Cardiology Office Note:    Date:  02/01/2019   ID:  [name], DOB 10-30-1980, MRN [id number]  PCP:  Horald Pollen, MD  Cardiologist:  No primary care provider on file.  Electrophysiologist:  None   Referring MD: Horald Pollen, *   No chief complaint on file. ***  History of Present Illness:    [name] is a 38 y.o. male with a hx of T2DM, HTN, HLD who presents for an initial visit following recent hospitalization with hypertensive urgency.  He was admitted from 01/14/01 through 01/16/19 after presenting with chest pain.  Was watching TV and had sudden onset of left-sided dull chest pain that radiated to left shoulder.  In ED, hsTn was negative x 2.  Initial BP up to 211/124.   He had been off his BP meds x 5 months and diabetes meds x 2 years.  He was restarted on lisinopril and amlodipine with improvement in symptoms.  BP 132/89 on discharge.  He remained chest pain free throughout hospitalization.  Last A1c was 11 on 04/17/18, he had been off his diabetes meds.  Was started on lantus and sliding scale insulin.  He was also restarted on crestor.  Past Medical History:  Diagnosis Date  . Allergies   . Allergy   . Anemia   . Anxiety   . Depression   . Diabetes mellitus   . GERD (gastroesophageal reflux disease)   . Hyperlipidemia   . Hypertension   . Lumbar disc herniation   . 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 PTSD (post-traumatic stress disorder)   . Scoliosis   . Sleep apnea     Past Surgical History:  Procedure Laterality Date  . LUMBAR LAMINECTOMY/DECOMPRESSION MICRODISCECTOMY Left 05/05/2016   Procedure: CENTRAL DECOMPRESSION L4-L5 AND FORAMINOTOMY FOR L4 ROOT AND L5 ROOT ON THE LEFT;  Surgeon: Latanya Maudlin, MD;  Location: [facility];  Service: Orthopedics;  Laterality: Left;  . NO PAST SURGERIES      Current Medications: No outpatient medications have been marked as taking for the 02/01/19 encounter (Appointment)  with Donato Heinz, MD.     Allergies:   Amoxicillin, Penicillins, Metformin and related, and Other   Social History   Socioeconomic History  . Marital status: Married    Spouse name: Not on file  . Number of children: Not on file  . Years of education: Not on file  . Highest education level: Not on file  Occupational History  . Not on file  Social Needs  . Financial resource strain: Not on file  . Food insecurity    Worry: Not on file    Inability: Not on file  . Transportation needs    Medical: Not on file    Non-medical: Not on file  Tobacco Use  . Smoking status: Former Smoker    Types: Cigars  . Smokeless tobacco: Current User    Types: Snuff, Chew  Substance and Sexual Activity  . Alcohol use: Yes    Alcohol/week: 5.0 standard drinks    Types: 5 Shots of liquor per week    Comment: few times year  . Drug use: No  . Sexual activity: Not on file  Lifestyle  . Physical activity    Days per week: Not on file    Minutes per session: Not on file  . Stress: Not on file  Relationships  . Social Herbalist on phone: Not  on file    Gets together: Not on file    Attends religious service: Not on file    Active member of club or organization: Not on file    Attends meetings of clubs or organizations: Not on file    Relationship status: Not on file  Other Topics Concern  . Not on file  Social History Narrative  . Not on file     Family History: The patient's ***family history includes Stroke in his maternal grandmother. There is no history of Colon cancer, Esophageal cancer, Rectal cancer, or Stomach cancer.  ROS:   Please see the history of present illness.    *** All other systems reviewed and are negative.  EKGs/Labs/Other Studies Reviewed:    The following studies were reviewed today: ***  EKG:  EKG is *** ordered today.  The ekg ordered today demonstrates ***  Recent Labs: 01/16/2019: Hemoglobin 12.6; Platelets 252 01/30/2019: ALT  31; BUN 9; Creatinine, Ser 0.84; Potassium 4.2; Sodium 139  Recent Lipid Panel    Component Value Date/Time   CHOL 180 01/30/2019 1221   TRIG 174 (H) 01/30/2019 1221   HDL 41 01/30/2019 1221   CHOLHDL 4.4 01/30/2019 1221   CHOLHDL 6.3 01/16/2019 0325   VLDL UNABLE TO CALCULATE IF TRIGLYCERIDE OVER 400 mg/dL 01/16/2019 0325   LDLCALC UNABLE TO CALCULATE IF TRIGLYCERIDE OVER 400 mg/dL 01/16/2019 0325   LDLCALC Comment 04/17/2018 1623   LDLDIRECT 76.9 01/16/2019 0325    Physical Exam:    VS:  There were no vitals taken for this visit.    Wt Readings from Last 3 Encounters:  01/30/19 (!) 318 lb 6.4 oz (144.4 kg)  01/30/19 (!) 320 lb (145.2 kg)  01/16/19 (!) 314 lb 13.1 oz (142.8 kg)     GEN: *** Well nourished, well developed in no acute distress HEENT: Normal NECK: No JVD; No carotid bruits LYMPHATICS: No lymphadenopathy CARDIAC: ***RRR, no murmurs, rubs, gallops RESPIRATORY:  Clear to auscultation without rales, wheezing or rhonchi  ABDOMEN: Soft, non-tender, non-distended MUSCULOSKELETAL:  No edema; No deformity  SKIN: Warm and dry NEUROLOGIC:  Alert and oriented x 3 PSYCHIATRIC:  Normal affect   ASSESSMENT:    No diagnosis found. PLAN:    In order of problems listed above:  Chest pain:  HTN: recent admission with hypertensive urgency, had been off meds.  Restarted on amlodipine 10 mg, lisinopril 20 mg daily  HLD: last LDL 108 01/30/19.  On rosuvastatin 20 mg daily  DM: poorly controlled, had not been taking meds.  Restarted on insulin during hospitalizatiion this month.  A1c 11.7 01/30/19  Medication Adjustments/Labs and Tests Ordered: Current medicines are reviewed at length with the patient today.  Concerns regarding medicines are outlined above.  No orders of the defined types were placed in this encounter.  No orders of the defined types were placed in this encounter.   There are no Patient Instructions on file for this visit.   Signed, Donato Heinz, MD  02/01/2019 9:44 AM    [place]

## 2019-02-01 NOTE — Telephone Encounter (Signed)
Left message in voice mail to call back with the type of glucose monitor, I am unable to order lancets.

## 2019-02-12 ENCOUNTER — Telehealth: Payer: Self-pay | Admitting: Gastroenterology

## 2019-02-12 NOTE — Telephone Encounter (Signed)
Do you now or have you had a fever in the last 14 days?    NO     Do you have any respiratory symptoms of shortness of breath or cough now or in the last 14 days?   NO     Do you have any family members or close contacts with diagnosed or suspected Covid-19 in the past 14 days?  NO       Have you been tested for Covid-19 and found to be positive?   NO     Pt made aware to check in on theth floor and that care partner may wait in the car or come up to the lobby during the procedure but will need to provide their own mask.

## 2019-02-13 ENCOUNTER — Ambulatory Visit (AMBULATORY_SURGERY_CENTER): Payer: 59 | Admitting: Gastroenterology

## 2019-02-13 ENCOUNTER — Other Ambulatory Visit: Payer: Self-pay

## 2019-02-13 ENCOUNTER — Encounter: Payer: Self-pay | Admitting: Gastroenterology

## 2019-02-13 VITALS — BP 118/88 | HR 91 | Temp 98.5°F | Resp 17 | Ht 75.0 in | Wt 320.0 lb

## 2019-02-13 DIAGNOSIS — K648 Other hemorrhoids: Secondary | ICD-10-CM | POA: Diagnosis not present

## 2019-02-13 DIAGNOSIS — K625 Hemorrhage of anus and rectum: Secondary | ICD-10-CM | POA: Diagnosis not present

## 2019-02-13 MED ORDER — SODIUM CHLORIDE 0.9 % IV SOLN: 500.0000 mL | Freq: Once | INTRAVENOUS | Status: DC

## 2019-02-13 NOTE — Progress Notes (Signed)
Pt's states no medical or surgical changes since previsit or office visit.   CW vitals, LC temps and BC IV.

## 2019-02-13 NOTE — Progress Notes (Signed)
PT taken to PACU. Monitors in place. VSS. Report given to RN. 

## 2019-02-13 NOTE — Op Note (Signed)
[facility] Patient Name: [name] Procedure Date: 02/13/2019 9:18 AM MRN: [id number] Endoscopist: Thornton Park MD, MD Age: 38 Referring MD:  Date of Birth: 02-15-1981 Gender: Male Account #: [id number] Procedure:                Colonoscopy Indications:              Rectal bleeding                           NSAIDs for chronic back pain                           History of microcytic anemia in 2009, hemoglobin >                            13 since that time                           No known family history of colon cancer or polyps Medicines:                See the Anesthesia note for documentation of the                            administered medications Procedure:                Pre-Anesthesia Assessment:                           - Prior to the procedure, a History and Physical                            was performed, and patient medications and                            allergies were reviewed. The patient's tolerance of                            previous anesthesia was also reviewed. The risks                            and benefits of the procedure and the sedation                            options and risks were discussed with the patient.                            All questions were answered, and informed consent                            was obtained. Prior Anticoagulants: The patient has                            taken no previous anticoagulant or antiplatelet  agents. ASA Grade Assessment: III - A patient with                            severe systemic disease. After reviewing the risks                            and benefits, the patient was deemed in                            satisfactory condition to undergo the procedure.                           After obtaining informed consent, the colonoscope                            was passed under direct vision. Throughout the                            procedure, the  patient's blood pressure, pulse, and                            oxygen saturations were monitored continuously. The                            Colonoscope was introduced through the anus and                            advanced to the the terminal ileum, with                            identification of the appendiceal orifice and IC                            valve. A second forward view of the right colon was                            performed. The colonoscopy was performed without                            difficulty. The patient tolerated the procedure                            well. The quality of the bowel preparation was                            excellent. The terminal ileum, ileocecal valve,                            appendiceal orifice, and rectum were photographed. Scope In: 9:26:03 AM Scope Out: 9:41:40 AM Scope Withdrawal Time: 0 hours 10 minutes 56 seconds  Total Procedure Duration: 0 hours 15 minutes 37 seconds  Findings:                 The perianal and digital rectal  examinations were                            normal except for small external hemorrhoids.                           Non-bleeding internal hemorrhoids were found.                           The exam was otherwise without abnormality on                            direct and retroflexion views. There was prominent                            lymphoid aggregate Complications:            No immediate complications. Estimated Blood Loss:     Estimated blood loss: none. Impression:               - Non-bleeding internal hemorrhoids.                           - The examination was otherwise normal on direct                            and retroflexion views.                           - No specimens collected. Recommendation:           - Patient has a contact number available for                            emergencies. The signs and symptoms of potential                            delayed complications were  discussed with the                            patient. Return to normal activities tomorrow.                            Written discharge instructions were provided to the                            patient.                           - Resume previous diet today. Drink at least 64                            ounces of water every day. Eat a high fiber diet                            with at least 35 grams of fiber daily. Use a daily  stool bulking agent such as Metamucil.                           - Continue present medications.                           - Repeat colonoscopy at age 71 for screening                            purposes. Thornton Park MD, MD 02/13/2019 9:50:03 AM This report has been signed electronically.

## 2019-02-13 NOTE — Patient Instructions (Signed)
Handouts for hemorrhoids and high fiber diet given.  Screening colonoscopy at age 38.  YOU HAD AN ENDOSCOPIC PROCEDURE TODAY AT [facility]:   Refer to the procedure report that was given to you for any specific questions about what was found during the examination.  If the procedure report does not answer your questions, please call your gastroenterologist to clarify.  If you requested that your care partner not be given the details of your procedure findings, then the procedure report has been included in a sealed envelope for you to review at your convenience later.  YOU SHOULD EXPECT: Some feelings of bloating in the abdomen. Passage of more gas than usual.  Walking can help get rid of the air that was put into your GI tract during the procedure and reduce the bloating. If you had a lower endoscopy (such as a colonoscopy or flexible sigmoidoscopy) you may notice spotting of blood in your stool or on the toilet paper. If you underwent a bowel prep for your procedure, you may not have a normal bowel movement for a few days.  Please Note:  You might notice some irritation and congestion in your nose or some drainage.  This is from the oxygen used during your procedure.  There is no need for concern and it should clear up in a day or so.  SYMPTOMS TO REPORT IMMEDIATELY:   Following lower endoscopy (colonoscopy or flexible sigmoidoscopy):  Excessive amounts of blood in the stool  Significant tenderness or worsening of abdominal pains  Swelling of the abdomen that is new, acute  Fever of 100F or higher  For urgent or emergent issues, a gastroenterologist can be reached at any hour by calling [phone].   DIET:  We do recommend a small meal at first, but then you may proceed to your regular diet.  Drink plenty of fluids but you should avoid alcoholic beverages for 24 hours.  ACTIVITY:  You should plan to take it easy for the rest of today and you should NOT DRIVE or use  heavy machinery until tomorrow (because of the sedation medicines used during the test).    FOLLOW UP: Our staff will call the number listed on your records 48-72 hours following your procedure to check on you and address any questions or concerns that you may have regarding the information given to you following your procedure. If we do not reach you, we will leave a message.  We will attempt to reach you two times.  During this call, we will ask if you have developed any symptoms of COVID 19. If you develop any symptoms (ie: fever, flu-like symptoms, shortness of breath, cough etc.) before then, please call [phone].  If you test positive for Covid 19 in the 2 weeks post procedure, please call and report this information to [place].    If any biopsies were taken you will be contacted by phone or by letter within the next 1-3 weeks.  Please call us at [phone] if you have not heard about the biopsies in 3 weeks.    SIGNATURES/CONFIDENTIALITY: You and/or your care partner have signed paperwork which will be entered into your electronic medical record.  These signatures attest to the fact that that the information above on your After Visit Summary has been reviewed and is understood.  Full responsibility of the confidentiality of this discharge information lies with you and/or your care-partner.

## 2019-02-16 ENCOUNTER — Telehealth: Payer: Self-pay | Admitting: *Deleted

## 2019-02-16 NOTE — Telephone Encounter (Signed)
  Follow up Call-  Call back number 02/13/2019  Post procedure Call Back phone  # [phone]  Permission to leave phone message Yes  Some recent data might be hidden    1. Have you developed a fever since your procedure? no  2.   Have you had an respiratory symptoms (SOB or cough) since your procedure? no  3.   Have you tested positive for COVID 19 since your procedure no  4.   Have you had any family members/close contacts diagnosed with the COVID 19 since your procedure?  no   If yes to any of these questions please route to Joylene John, RN and Alphonsa Gin, Therapist, sports.  Patient questions:  Do you have a fever, pain , or abdominal swelling? No. Pain Score  0 *  Have you tolerated food without any problems? Yes.    Have you been able to return to your normal activities? Yes.    Do you have any questions about your discharge instructions: Diet   No. Medications  No. Follow up visit  No.  Do you have questions or concerns about your Care? No.  Actions: * If pain score is 4 or above: No action needed, pain <4.

## 2019-02-28 ENCOUNTER — Ambulatory Visit: Payer: 59 | Admitting: Emergency Medicine

## 2019-03-05 ENCOUNTER — Encounter: Payer: Self-pay | Admitting: Family

## 2019-03-05 ENCOUNTER — Ambulatory Visit (INDEPENDENT_AMBULATORY_CARE_PROVIDER_SITE_OTHER)
Admission: RE | Admit: 2019-03-05 | Discharge: 2019-03-05 | Disposition: A | Discharge status: Home / Self Care | Payer: 59 | Source: Ambulatory Visit

## 2019-03-05 ENCOUNTER — Telehealth: Payer: 59 | Admitting: Family

## 2019-03-05 DIAGNOSIS — R05 Cough: Secondary | ICD-10-CM

## 2019-03-05 DIAGNOSIS — R059 Cough, unspecified: Secondary | ICD-10-CM

## 2019-03-05 MED ORDER — DOXYCYCLINE HYCLATE 100 MG PO CAPS: 100.0000 mg | ORAL_CAPSULE | Freq: Two times a day (BID) | ORAL | 0 refills | Status: DC

## 2019-03-05 MED ORDER — GUAIFENESIN ER 600 MG PO TB12: 600.0000 mg | ORAL_TABLET | Freq: Two times a day (BID) | ORAL | 0 refills | Status: DC

## 2019-03-05 NOTE — Progress Notes (Signed)
Encounter Error. 

## 2019-03-05 NOTE — ED Provider Notes (Signed)
Virtual Visit via Video Note:  [name]  initiated request for Telemedicine visit with [name] team. I connected with [name]  on 03/05/2019 at 10:45 AM  for a synchronized telemedicine visit using a video enabled HIPPA compliant telemedicine application. I verified that I am speaking with [name]  using two identifiers. Orvan July, NP  was physically located in a [facility] Urgent care site and [name] was located at a different location.   The limitations of evaluation and management by telemedicine as well as the availability of in-person appointments were discussed. Patient was informed that he  may incur a bill ( including co-pay) for this virtual visit encounter. [name]  expressed understanding and gave verbal consent to proceed with virtual visit.     History of Present Illness:[name]  is a 38 y.o. male presents with continued cough, chest congestion, chest pain with coughing. This has been ongoing, waxing and waning x 1 month. Tested negative for COVID. Has been taking daily allergy mediation. Yellow/clear mucous. No fever, sinus congestion, rhinorrhea, sore throat or ear pain. Both kids recently sick.  No recent long distance traveling.  No history of DVT/PE.  No lower extremity edema.  No shortness of breath  Past Medical History:  Diagnosis Date  . Allergies   . Allergy   . Anemia   . Anxiety   . Depression   . Diabetes mellitus   . GERD (gastroesophageal reflux disease)   . Hyperlipidemia   . Hypertension   . Lumbar disc herniation   . 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 PTSD (post-traumatic stress disorder)   . Scoliosis   . Sleep apnea     Allergies  Allergen Reactions  . Amoxicillin Anaphylaxis and Other (See Comments)    Has patient had a PCN reaction causing immediate rash, facial/tongue/throat swelling, SOB or lightheadedness with hypotension: yes Has patient had a PCN  reaction causing severe rash involving mucus membranes or skin necrosis: no Has patient had a PCN reaction that required hospitalization yes Has patient had a PCN reaction occurring within the last 10 years: yes If all of the above answers are "NO", then may proceed with Cephalosporin use.  [name] Penicillins Anaphylaxis and Other (See Comments)    Patient was told to list this as an allergy because he is allergic to Amoxicillin Has patient had a PCN reaction causing immediate rash, facial/tongue/throat swelling, SOB or lightheadedness with hypotension: yes for Amoxicillin Has patient had a PCN reaction causing severe rash involving mucus membranes or skin necrosis: no Has patient had a PCN reaction that required hospitalization yes for Amoxicillin Has patient had a PCN reaction occurring within the last 10 years: yes for Amoxicillin If all o  . Metformin And Related Other (See Comments)    SEVERE GI UPSET  . Other Other (See Comments)    Powder in some gloves - localized itching but not allergic to latex, benadryl usually helps with this reaction        Observations/Objective: VITALS: Per patient if applicable, see vitals. GENERAL: Alert, appears well and in no acute distress. HEENT: Atraumatic, conjunctiva clear, no obvious abnormalities on inspection of external nose and ears. NECK: Normal movements of the head and neck. CARDIOPULMONARY: No increased WOB. Speaking in clear sentences. I:E ratio WNL.  MS: Moves all visible extremities without noticeable abnormality. PSYCH: Pleasant and cooperative, well-groomed. Speech normal rate and  rhythm. Affect is appropriate. Insight and judgement are appropriate. Attention is focused, linear, and appropriate.  NEURO: CN grossly intact. Oriented as arrived to appointment on time with no prompting. Moves both UE equally.  SKIN: No obvious lesions, wounds, erythema, or cyanosis noted on face or hands.     Assessment and Plan: 1 month or more of  continued symptoms. No worrisome signs or symptoms. Will treat for lower respiratory tract infection with doxycycline.  Also adding mucinex.  If symptoms continue or worsen he will need to be seen in person.  Pt understanding and agreed.    Follow Up Instructions: Follow up as needed for continued or worsening symptoms     I discussed the assessment and treatment plan with the patient. The patient was provided an opportunity to ask questions and all were answered. The patient agreed with the plan and demonstrated an understanding of the instructions.   The patient was advised to call back or seek an in-person evaluation if the symptoms worsen or if the condition fails to improve as anticipated.    Orvan July, NP  03/05/2019 10:45 AM         Orvan July, NP 03/05/19 1054

## 2019-03-05 NOTE — Discharge Instructions (Signed)
Treating you for possible lower respiratory tract infection.  Take the medication as prescribed.  Doxycycline for antibiotic coverage and Mucinex for cough and congestion. If your symptoms continue or worsen you need to follow-up with your primary care.

## 2019-03-07 ENCOUNTER — Telehealth (INDEPENDENT_AMBULATORY_CARE_PROVIDER_SITE_OTHER): Payer: 59 | Admitting: Emergency Medicine

## 2019-03-07 ENCOUNTER — Encounter: Payer: Self-pay | Admitting: Emergency Medicine

## 2019-03-07 ENCOUNTER — Other Ambulatory Visit: Payer: Self-pay

## 2019-03-07 ENCOUNTER — Ambulatory Visit (INDEPENDENT_AMBULATORY_CARE_PROVIDER_SITE_OTHER): Payer: 59

## 2019-03-07 ENCOUNTER — Ambulatory Visit (HOSPITAL_COMMUNITY)
Admission: EM | Admit: 2019-03-07 | Discharge: 2019-03-07 | Disposition: A | Discharge status: Home / Self Care | Payer: 59 | Attending: Family Medicine | Admitting: Family Medicine

## 2019-03-07 ENCOUNTER — Encounter (HOSPITAL_COMMUNITY): Payer: Self-pay

## 2019-03-07 VITALS — Ht 73.0 in | Wt 318.0 lb

## 2019-03-07 DIAGNOSIS — M79605 Pain in left leg: Secondary | ICD-10-CM

## 2019-03-07 DIAGNOSIS — R000 Tachycardia, unspecified: Secondary | ICD-10-CM | POA: Diagnosis present

## 2019-03-07 DIAGNOSIS — R0600 Dyspnea, unspecified: Secondary | ICD-10-CM | POA: Diagnosis not present

## 2019-03-07 DIAGNOSIS — R05 Cough: Secondary | ICD-10-CM | POA: Diagnosis not present

## 2019-03-07 DIAGNOSIS — I10 Essential (primary) hypertension: Secondary | ICD-10-CM

## 2019-03-07 DIAGNOSIS — J069 Acute upper respiratory infection, unspecified: Secondary | ICD-10-CM | POA: Diagnosis present

## 2019-03-07 DIAGNOSIS — R0602 Shortness of breath: Secondary | ICD-10-CM

## 2019-03-07 DIAGNOSIS — E785 Hyperlipidemia, unspecified: Secondary | ICD-10-CM

## 2019-03-07 DIAGNOSIS — R0789 Other chest pain: Secondary | ICD-10-CM

## 2019-03-07 DIAGNOSIS — R079 Chest pain, unspecified: Secondary | ICD-10-CM | POA: Diagnosis not present

## 2019-03-07 DIAGNOSIS — E1159 Type 2 diabetes mellitus with other circulatory complications: Secondary | ICD-10-CM | POA: Diagnosis not present

## 2019-03-07 DIAGNOSIS — E1169 Type 2 diabetes mellitus with other specified complication: Secondary | ICD-10-CM

## 2019-03-07 LAB — CBC WITH DIFFERENTIAL/PLATELET
Abs Immature Granulocytes: 0.06 10*3/uL (ref 0.00–0.07)
Basophils Absolute: 0 10*3/uL (ref 0.0–0.1)
Basophils Relative: 0 %
Eosinophils Absolute: 0.2 10*3/uL (ref 0.0–0.5)
Eosinophils Relative: 1 %
HCT: 41.7 % (ref 39.0–52.0)
Hemoglobin: 13.7 g/dL (ref 13.0–17.0)
Immature Granulocytes: 1 %
Lymphocytes Relative: 27 %
Lymphs Abs: 2.9 10*3/uL (ref 0.7–4.0)
MCH: 26.4 pg (ref 26.0–34.0)
MCHC: 32.9 g/dL (ref 30.0–36.0)
MCV: 80.3 fL (ref 80.0–100.0)
Monocytes Absolute: 0.6 10*3/uL (ref 0.1–1.0)
Monocytes Relative: 6 %
Neutro Abs: 7.2 10*3/uL (ref 1.7–7.7)
Neutrophils Relative %: 65 %
Platelets: 325 10*3/uL (ref 150–400)
RBC: 5.19 MIL/uL (ref 4.22–5.81)
RDW: 13.2 % (ref 11.5–15.5)
WBC: 11 10*3/uL — ABNORMAL HIGH (ref 4.0–10.5)
nRBC: 0 % (ref 0.0–0.2)

## 2019-03-07 LAB — D-DIMER, QUANTITATIVE: D-Dimer, Quant: 0.27 ug/mL-FEU (ref 0.00–0.50)

## 2019-03-07 MED ORDER — ALBUTEROL SULFATE HFA 108 (90 BASE) MCG/ACT IN AERS: 1.0000 | INHALATION_SPRAY | Freq: Four times a day (QID) | RESPIRATORY_TRACT | 0 refills | Status: DC | PRN

## 2019-03-07 NOTE — Progress Notes (Signed)
Telemedicine Encounter- SOAP NOTE Established Patient  This telephone encounter was conducted with the patient's (or proxy's) verbal consent via audio telecommunications: yes/no: Yes Patient was instructed to have this encounter in a suitably private space; and to only have persons present to whom they give permission to participate. In addition, patient identity was confirmed by use of name plus two identifiers (DOB and address).  I discussed the limitations, risks, security and privacy concerns of performing an evaluation and management service by telephone and the availability of in person appointments. I also discussed with the patient that there may be a patient responsible charge related to this service. The patient expressed understanding and agreed to proceed.  I spent a total of TIME; 0 MIN TO 60 MIN: 15 minutes talking with the patient or their proxy.  No chief complaint on file.  Chest pain and shortness of breath Subjective   [name] is a 38 y.o. male established patient with history of hypertension associated with diabetes. Telephone visit today complaining of chest pain and difficulty breathing.  States that 2 weeks ago he was exposed to Covid patient, tested negative himself.  Shortly after he developed what felt like allergy symptoms with a mild cough and headache.  Had virtual visit with [facility] last Monday and was started on doxycycline to cover possible respiratory infection.  Has been having left-sided pleuritic chest pain since last Saturday, 5 days ago, followed by intermittent episodes of dyspnea.  Yesterday developed pain to his left leg.  Concerned about pulmonary embolus.  No prior history.  No other significant symptoms.  Denies fever, chills, or flulike symptoms.  HPI   Patient Active Problem List   Diagnosis Date Noted  . Hypertension associated with diabetes ([place]) 04/17/2018  . Dyslipidemia 04/17/2018  . PTSD (post-traumatic stress disorder) 08/11/2016   . Spinal stenosis, lumbar region with neurogenic claudication 05/05/2016  . Dysthymia 09/02/2015  . Chronic low back pain 09/02/2015  . Dyslipidemia associated with type 2 diabetes mellitus ([place]) 03/11/2008  . UNSPECIFIED ANEMIA 03/11/2008    Past Medical History:  Diagnosis Date  . Allergies   . Allergy   . Anemia   . Anxiety   . Depression   . Diabetes mellitus   . GERD (gastroesophageal reflux disease)   . Hyperlipidemia   . Hypertension   . Lumbar disc herniation   . 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 PTSD (post-traumatic stress disorder)   . Scoliosis   . Sleep apnea     Current Outpatient Medications  Medication Sig Dispense Refill  . acetaminophen (TYLENOL) 500 MG tablet Take 1,000 mg by mouth every 6 (six) hours as needed for mild pain.    [name] amLODipine (NORVASC) 10 MG tablet Take 1 tablet (10 mg total) by mouth daily. 30 tablet 2  . aspirin EC 81 MG tablet Take 1 tablet (81 mg total) by mouth daily.    [name] doxycycline (VIBRAMYCIN) 100 MG capsule Take 1 capsule (100 mg total) by mouth 2 (two) times daily. 20 capsule 0  . doxylamine, Sleep, (UNISOM) 25 MG tablet Take 25 mg by mouth at bedtime as needed.    [name] glipiZIDE (GLUCOTROL) 5 MG tablet Take 1 tablet (5 mg total) by mouth 2 (two) times daily before a meal. 180 tablet 1  . lisinopril (ZESTRIL) 20 MG tablet Take 1 tablet (20 mg total) by mouth daily. 30 tablet 1  . loratadine (CLARITIN) 10 MG tablet Take 10  mg by mouth as needed for allergies.    . nitroGLYCERIN (NITROSTAT) 0.4 MG SL tablet Place 1 tablet (0.4 mg total) under the tongue every 5 (five) minutes as needed for chest pain. 15 tablet 0  . rosuvastatin (CRESTOR) 20 MG tablet Take 1 tablet (20 mg total) by mouth daily. 30 tablet 1  . blood glucose meter kit and supplies Dispense based on patient and insurance preference. Use up to four times daily as directed. (FOR ICD-10 E10.9, E11.9). 1 each 0  . guaiFENesin (MUCINEX) 600 MG 12  hr tablet Take 1 tablet (600 mg total) by mouth 2 (two) times daily. (Patient not taking: Reported on 03/07/2019) 15 tablet 0   Current Facility-Administered Medications  Medication Dose Route Frequency Provider Last Rate Last Dose  . 0.9 %  sodium chloride infusion  500 mL Intravenous Once [place], MD        Allergies  Allergen Reactions  . Amoxicillin Anaphylaxis and Other (See Comments)    Has patient had a PCN reaction causing immediate rash, facial/tongue/throat swelling, SOB or lightheadedness with hypotension: yes Has patient had a PCN reaction causing severe rash involving mucus membranes or skin necrosis: no Has patient had a PCN reaction that required hospitalization yes Has patient had a PCN reaction occurring within the last 10 years: yes If all of the above answers are "NO", then may proceed with Cephalosporin use.  [name] Penicillins Anaphylaxis and Other (See Comments)    Patient was told to list this as an allergy because he is allergic to Amoxicillin Has patient had a PCN reaction causing immediate rash, facial/tongue/throat swelling, SOB or lightheadedness with hypotension: yes for Amoxicillin Has patient had a PCN reaction causing severe rash involving mucus membranes or skin necrosis: no Has patient had a PCN reaction that required hospitalization yes for Amoxicillin Has patient had a PCN reaction occurring within the last 10 years: yes for Amoxicillin If all o  . Metformin And Related Other (See Comments)    SEVERE GI UPSET  . Other Other (See Comments)    Powder in some gloves - localized itching but not allergic to latex, benadryl usually helps with this reaction    Social History   Socioeconomic History  . Marital status: Divorced    Spouse name: Not on file  . Number of children: Not on file  . Years of education: Not on file  . Highest education level: Not on file  Occupational History  . Not on file  Social Needs  . Financial resource strain: Not  on file  . Food insecurity    Worry: Not on file    Inability: Not on file  . Transportation needs    Medical: Not on file    Non-medical: Not on file  Tobacco Use  . Smoking status: Former Smoker    Types: Cigars  . Smokeless tobacco: Current User    Types: Snuff, Chew  Substance and Sexual Activity  . Alcohol use: Yes    Alcohol/week: 5.0 standard drinks    Types: 5 Shots of liquor per week    Comment: few times year  . Drug use: No  . Sexual activity: Not on file  Lifestyle  . Physical activity    Days per week: Not on file    Minutes per session: Not on file  . Stress: Not on file  Relationships  . Social Herbalist on phone: Not on file    Gets together: Not on file  Attends religious service: Not on file    Active member of club or organization: Not on file    Attends meetings of clubs or organizations: Not on file    Relationship status: Not on file  . Intimate partner violence    Fear of current or ex partner: Not on file    Emotionally abused: Not on file    Physically abused: Not on file    Forced sexual activity: Not on file  Other Topics Concern  . Not on file  Social History Narrative  . Not on file    Review of Systems  Constitutional: Negative.  Negative for chills and fever.  HENT: Negative.  Negative for congestion and sore throat.   Respiratory: Positive for cough and shortness of breath. Negative for hemoptysis, sputum production and wheezing.   Cardiovascular: Positive for chest pain. Negative for palpitations.  Gastrointestinal: Negative.  Negative for abdominal pain, diarrhea, nausea and vomiting.  Genitourinary: Negative for hematuria.  Musculoskeletal:       Left leg pain  Skin: Negative.  Negative for rash.  Neurological: Negative.  Negative for dizziness and headaches.  All other systems reviewed and are negative.   Objective  Alert and oriented x3 no apparent respiratory distress. Vitals as reported by the patient:  Today's Vitals   03/07/19 1429  Weight: (!) 318 lb (144.2 kg)  Height: 6' 1"  (1.854 m)    There are no diagnoses linked to this encounter. Diagnoses and all orders for this visit:  Nonspecific chest pain  Dyspnea, unspecified type  Left leg pain  Hypertension associated with diabetes ([place])  Dyslipidemia associated with type 2 diabetes mellitus ([place])   Given this clinical picture, pulmonary embolism is a likely possibility that needs to be ruled out.  Advised to go to [facility] urgent care center or emergency department for evaluation and diagnostic work-up.  I discussed the assessment and treatment plan with the patient. The patient was provided an opportunity to ask questions and all were answered. The patient agreed with the plan and demonstrated an understanding of the instructions.   The patient was advised to call back or seek an in-person evaluation if the symptoms worsen or if the condition fails to improve as anticipated.  I provided 15 minutes of non-face-to-face time during this encounter.  Horald Pollen, MD  Primary Care at [facility]

## 2019-03-07 NOTE — Progress Notes (Signed)
Called patient to triage for appointment. Patient is complaining of shortness of breath, headache and chest pain Saturday, now it is worse. Also, coughing clear mucus now and clear, yellow mucus before. Patient states he had an virtual appointment Monday at [facility] and prescribed doxycycline. Patient states he was exposed to someone with COVID19 on 02/22/2019 and was tested, the results were negative. Patient states he has pain in the left leg that started 36 hours.

## 2019-03-07 NOTE — ED Triage Notes (Signed)
Pt states he has been treated for Pneumonia.   Pt states he is taking a med for it already. Pt states it working to a degree.

## 2019-03-07 NOTE — Discharge Instructions (Addendum)
Your ekg and chest xray look well today.  Your heart rate is elevated, therefore with your current chest pain and leg pain, we will evaluate this with a d-dimer.  If this test returns elevated I will direct you to the ER for further evaluation, as blood clot must be considered.  If negative there is minimal chance of blood clot.  Push fluids to ensure adequate hydration and keep secretions thin.  Tylenol and/or ibuprofen as needed for pain.  Use of inhaler as needed for wheezing or shortness of breath.   Please continue to work with your primary care provider if symptoms persist.  Any worsening of symptoms, return of fever, headache, worsening of chest pain  or shortness of breath , or otherwise worsening please go to the ER.

## 2019-03-07 NOTE — ED Provider Notes (Signed)
[place]    CSN: [id number] Arrival date & time: 03/07/19  1549      History   Chief Complaint Chief Complaint  Patient presents with  . Shortness of Breath    HPI [name] is a 38 y.o. male.   [name] presents with complaints of shortness of breath. 10/15 was exposed at work to [employer], was tested 10/20 which was negative. Patient with cough, sore throat and allergy symptoms. These were not new symptoms for him. He takes claritin to try to help with these symptoms. 10/20 night patient and his children started feeling unwell, his children had fever. Started to feel worse 10/23 with moderate shortness of breath. Chest pain to the left side of chest. 10/25 also with left leg pain which feels different than his typical neuropathy pain. Virtual visit on 10/26 was treated for possible pneumonia with doxycycline. Since yesterday his cough and sore throat have improved. However, has an increase in shortness of breath and chest pain. Sharp stabbing pain to left chest which radiates to back shoulder blade. Worse since yesterday. Still with left leg pain but it hasn't worsened. No new swelling to leg, no new numbness or tingling.  4/10 in pain to leg. Pain to groin to heel. Pain with deep breathing. Any light walking causes shortness of breath. Doesn't smoke. No family or personal history of blood clots. No recent travel. He works 12 hour shifts, states he will sit about half of shift. Children's covid testing were negative. He has not had any fevers throughout course of illness. Dull chest pain currently. Last week with nasal drainage. No new nausea or diarrhea. No asthma history. History  Of allergies, anxiety, depression, dm, gerd, htn.    ROS per HPI, negative if not otherwise mentioned.      Past Medical History:  Diagnosis Date  . Allergies   . Allergy   . Anemia   . Anxiety   . Depression   . Diabetes mellitus   . GERD (gastroesophageal reflux disease)    . Hyperlipidemia   . Hypertension   . Lumbar disc herniation   . 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 PTSD (post-traumatic stress disorder)   . Scoliosis   . Sleep apnea     Patient Active Problem List   Diagnosis Date Noted  . Hypertension associated with diabetes ([place]) 04/17/2018  . Dyslipidemia 04/17/2018  . PTSD (post-traumatic stress disorder) 08/11/2016  . Spinal stenosis, lumbar region with neurogenic claudication 05/05/2016  . Dysthymia 09/02/2015  . Chronic low back pain 09/02/2015  . Dyslipidemia associated with type 2 diabetes mellitus ([place]) 03/11/2008  . UNSPECIFIED ANEMIA 03/11/2008    Past Surgical History:  Procedure Laterality Date  . LUMBAR LAMINECTOMY/DECOMPRESSION MICRODISCECTOMY Left 05/05/2016   Procedure: CENTRAL DECOMPRESSION L4-L5 AND FORAMINOTOMY FOR L4 ROOT AND L5 ROOT ON THE LEFT;  Surgeon: Latanya Maudlin, MD;  Location: [facility] ORS;  Service: Orthopedics;  Laterality: Left;  . NO PAST SURGERIES         Home Medications    Prior to Admission medications   Medication Sig Start Date End Date Taking? Authorizing Provider  acetaminophen (TYLENOL) 500 MG tablet Take 1,000 mg by mouth every 6 (six) hours as needed for mild pain.    [provider]  albuterol (PROAIR HFA) 108 (90 Base) MCG/ACT inhaler Inhale 1-2 puffs into the lungs every 6 (six) hours as needed for wheezing or shortness of breath. 03/07/19  Augusto Gamble B, NP  amLODipine (NORVASC) 10 MG tablet Take 1 tablet (10 mg total) by mouth daily. 01/16/19   Eugenie Filler, MD  aspirin EC 81 MG tablet Take 1 tablet (81 mg total) by mouth daily. 07/11/14   Shawnee Knapp, MD  blood glucose meter kit and supplies Dispense based on patient and insurance preference. Use up to four times daily as directed. (FOR ICD-10 E10.9, E11.9). 01/16/19   Eugenie Filler, MD  doxycycline (VIBRAMYCIN) 100 MG capsule Take 1 capsule (100 mg total) by mouth 2 (two) times daily.  03/05/19   Loura Halt A, NP  doxylamine, Sleep, (UNISOM) 25 MG tablet Take 25 mg by mouth at bedtime as needed.    [provider]  glipiZIDE (GLUCOTROL) 5 MG tablet Take 1 tablet (5 mg total) by mouth 2 (two) times daily before a meal. 01/30/19 04/30/19  Sagardia, Ines Bloomer, MD  guaiFENesin (MUCINEX) 600 MG 12 hr tablet Take 1 tablet (600 mg total) by mouth 2 (two) times daily. Patient not taking: Reported on 03/07/2019 03/05/19   Loura Halt A, NP  lisinopril (ZESTRIL) 20 MG tablet Take 1 tablet (20 mg total) by mouth daily. 01/16/19   Eugenie Filler, MD  loratadine (CLARITIN) 10 MG tablet Take 10 mg by mouth as needed for allergies.    [provider]  nitroGLYCERIN (NITROSTAT) 0.4 MG SL tablet Place 1 tablet (0.4 mg total) under the tongue every 5 (five) minutes as needed for chest pain. 01/16/19   Eugenie Filler, MD  rosuvastatin (CRESTOR) 20 MG tablet Take 1 tablet (20 mg total) by mouth daily. 01/16/19   Eugenie Filler, MD    Family History Family History  Problem Relation Age of Onset  . Stroke Maternal Grandmother   . Colon cancer Neg Hx   . Esophageal cancer Neg Hx   . Rectal cancer Neg Hx   . Stomach cancer Neg Hx     Social History Social History   Tobacco Use  . Smoking status: Former Smoker    Types: Cigars  . Smokeless tobacco: Current User    Types: Snuff, Chew  Substance Use Topics  . Alcohol use: Yes    Alcohol/week: 5.0 standard drinks    Types: 5 Shots of liquor per week    Comment: few times year  . Drug use: No     Allergies   Amoxicillin, Penicillins, Metformin and related, and Other   Review of Systems Review of Systems   Physical Exam Triage Vital Signs ED Triage Vitals  Enc Vitals Group     BP 03/07/19 1629 (!) 166/105     Pulse Rate 03/07/19 1629 (!) 110     Resp 03/07/19 1629 18     Temp 03/07/19 1629 98.8 F (37.1 C)     Temp Source 03/07/19 1629 Oral     SpO2 03/07/19 1629 99 %     Weight 03/07/19 1631  (!) 318 lb (144.2 kg)     Height --      Head Circumference --      Peak Flow --      Pain Score 03/07/19 1631 6     Pain Loc --      Pain Edu? --      Excl. in [place]? --    No data found.  Updated Vital Signs BP (!) 166/105 (BP Location: Right Arm)   Pulse (!) 110   Temp 98.8 F (37.1 C) (Oral)   Resp  18   Wt (!) 318 lb (144.2 kg)   SpO2 99%   BMI 41.96 kg/m    Physical Exam Constitutional:      Appearance: He is well-developed.  HENT:     Head: Normocephalic and atraumatic.  Cardiovascular:     Rate and Rhythm: Regular rhythm. Tachycardia present.  Pulmonary:     Effort: Pulmonary effort is normal.     Breath sounds: Normal breath sounds. No decreased breath sounds.  Musculoskeletal:     Comments: Leg leg without tenderness on palpation; no swelling, no redness or warmth; negative homans'; unable to reproduce pain with touch or movement  Skin:    General: Skin is warm and dry.  Neurological:     Mental Status: He is alert and oriented to person, place, and time.    EKG:  SORE THROAT rate of 105 . Previous EKG was available for review. No stwave changes as interpreted by me.   Reviewed with dr. Mannie Stabile as well  UC Treatments / Results  Labs (all labs ordered are listed, but only abnormal results are displayed) Labs Reviewed  D-DIMER, QUANTITATIVE (NOT AT [facility])  CBC WITH DIFFERENTIAL/PLATELET    EKG   Radiology Dg Chest 2 View  Result Date: 03/07/2019 CLINICAL DATA:  Shortness of breath.  Chest pain. EXAM: CHEST - 2 VIEW COMPARISON:  01/15/2019 FINDINGS: The heart size and mediastinal contours are within normal limits. Both lungs are clear. The visualized skeletal structures are unremarkable. IMPRESSION: Normal exam. Electronically Signed   By: Lorriane Shire M.D.   On: 03/07/2019 17:04    Procedures Procedures (including critical care time)  Medications Ordered in UC Medications - No data to display  Initial Impression / Assessment and Plan / UC Course   I have reviewed the triage vital signs and the nursing notes.  Pertinent labs & imaging results that were available during my care of the patient were reviewed by me and considered in my medical decision making (see chart for details).     Patient is non toxic. No increased work of breathing noted. Afebrile. Negative covid testing for him and family, although possible exposure with initial onset of symptoms two weeks ago. New onset of chest pain and leg pain. ekg and chest xray reassuring. Obese, otherwise no other PE risk factors. However, chest pain , shortness of breath with inspiration, and new leg pain with tachycardia present. D-dimer obtained. Cbc also pending. To continue with doxycycline as previously prescribed to cover any atypical infection. Post viral symptoms? Strict er precautions discussed. Patient verbalized understanding and agreeable to plan.   Final Clinical Impressions(s) / UC Diagnoses   Final diagnoses:  SOB (shortness of breath)  Left leg pain  Tachycardia  Recent URI     Discharge Instructions     Your ekg and chest xray look well today.  Your heart rate is elevated, therefore with your current chest pain and leg pain, we will evaluate this with a d-dimer.  If this test returns elevated I will direct you to the ER for further evaluation, as blood clot must be considered.  If negative there is minimal chance of blood clot.  Push fluids to ensure adequate hydration and keep secretions thin.  Tylenol and/or ibuprofen as needed for pain.  Use of inhaler as needed for wheezing or shortness of breath.   Please continue to work with your primary care provider if symptoms persist.  Any worsening of symptoms, return of fever, headache, worsening of chest pain  or shortness of breath , or otherwise worsening please go to the ER.     ED Prescriptions    Medication Sig Dispense Auth. Provider   albuterol (PROAIR HFA) 108 (90 Base) MCG/ACT inhaler Inhale 1-2 puffs into  the lungs every 6 (six) hours as needed for wheezing or shortness of breath. 8 g Zigmund Gottron, NP     PDMP not reviewed this encounter.   Zigmund Gottron, NP 03/07/19 [id number]

## 2019-03-08 ENCOUNTER — Telehealth (HOSPITAL_COMMUNITY): Payer: Self-pay | Admitting: Emergency Medicine

## 2019-03-08 NOTE — Telephone Encounter (Signed)
Attempted to reach patient to discuss provider notes regarding lab work. No answer. Sent notes through [employer].

## 2019-04-16 IMAGING — CT CT ABD-PELV W/ CM
2 of 4 series · 16 of 46 positions shown, 18 images · IV contrast (APPLIED)
Comparison: 03/24/2012

CLINICAL DATA: Lower abdominal pain and red stools beginning
tonight at 7 p.m.. Mainly mid abdominal pain.

EXAM:
CT ABDOMEN AND PELVIS WITH CONTRAST
TECHNIQUE: Multidetector CT imaging of the abdomen and pelvis was performed
using the standard protocol following bolus administration of
intravenous contrast.
CONTRAST:  100mL OMNIPAQUE IOHEXOL 300 MG/ML  SOLN

[Series 3: abdomen 5.0 · axial · 0.98mm/px · z∈[+989,+1414]mm · 13 of 97 slices shown, 15 images]
[im 6/97  soft-tissue]
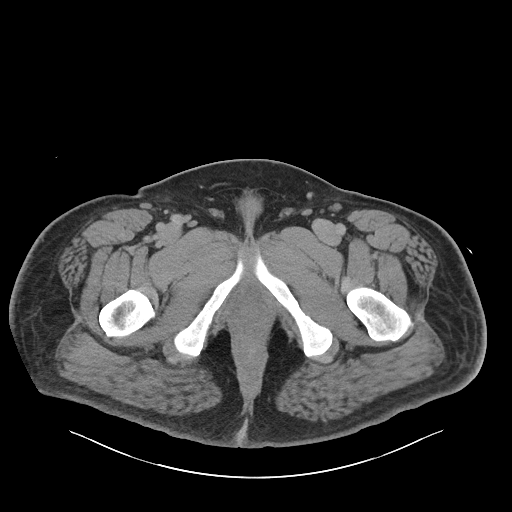
[im 6/97  bone]
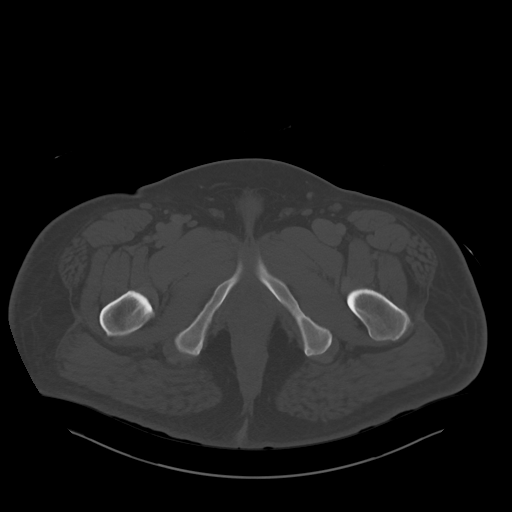
[im 16/97  soft-tissue]
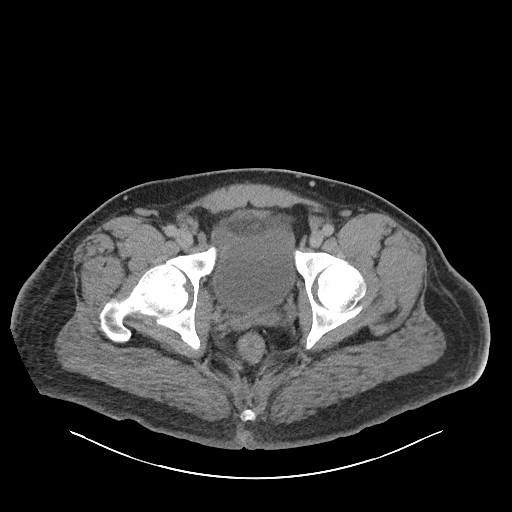
[im 21/97  soft-tissue]
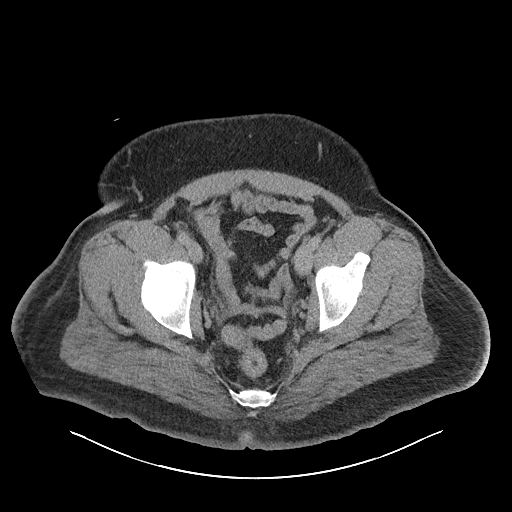
[im 26/97  soft-tissue]
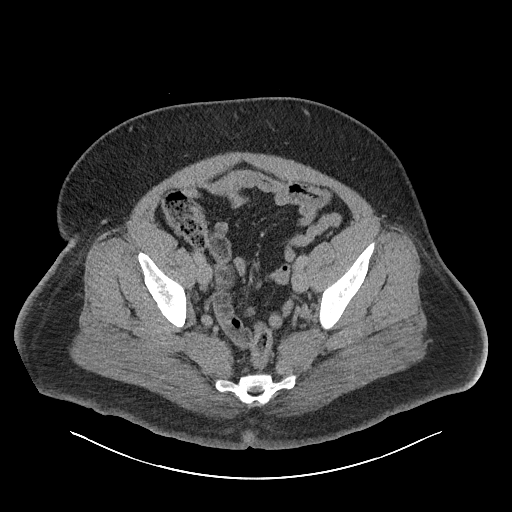
[im 36/97  soft-tissue]
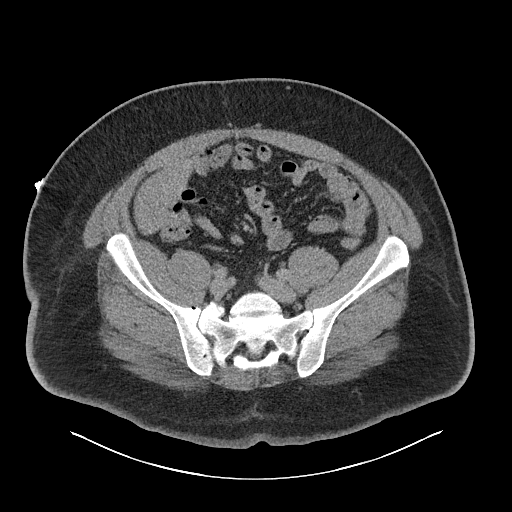
[im 41/97  soft-tissue]
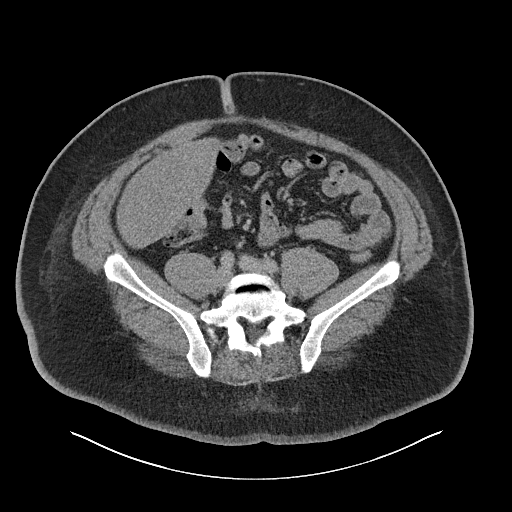
[im 51/97  soft-tissue]
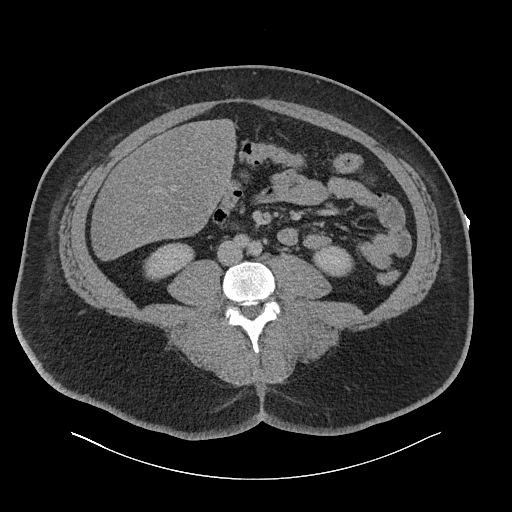
[im 56/97  soft-tissue]
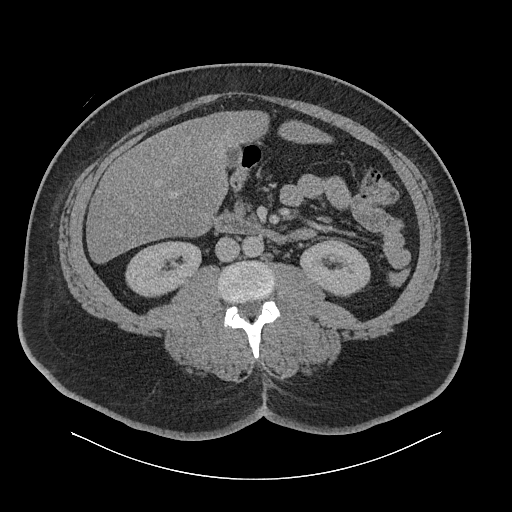
[im 61/97  soft-tissue]
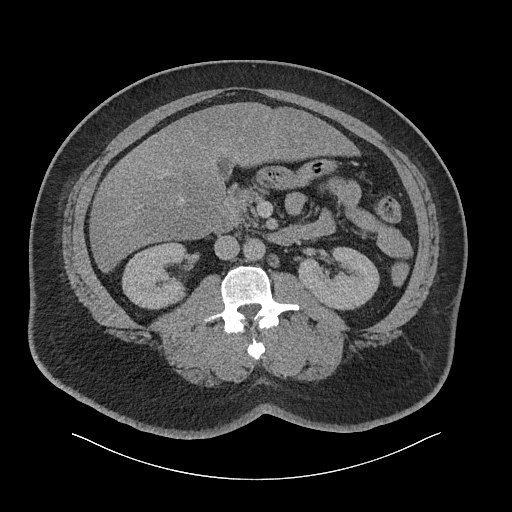
[im 61/97  bone]
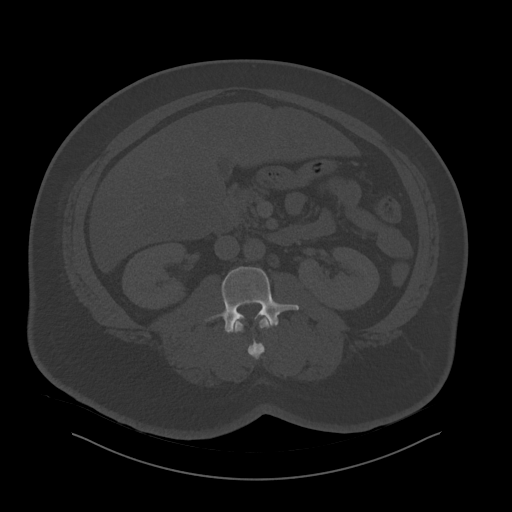
[im 71/97  soft-tissue]
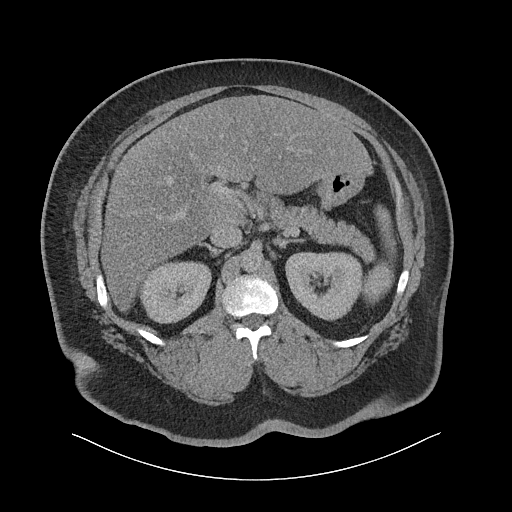
[im 76/97  soft-tissue]
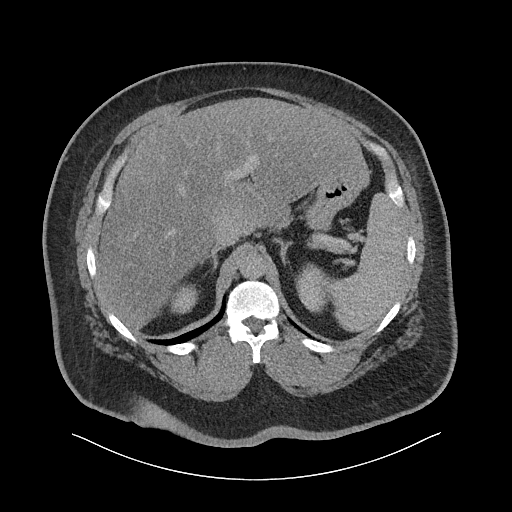
[im 81/97  soft-tissue]
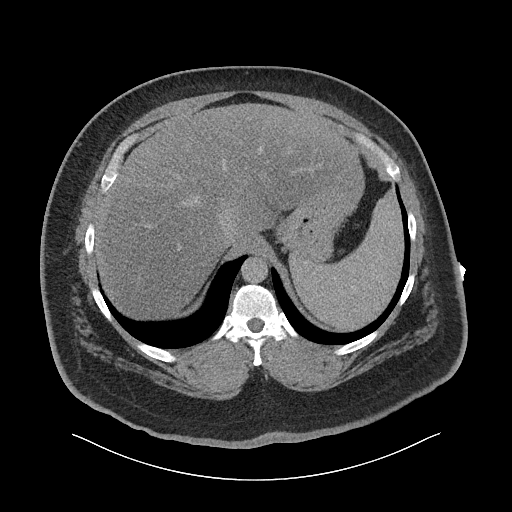
[im 91/97  soft-tissue]
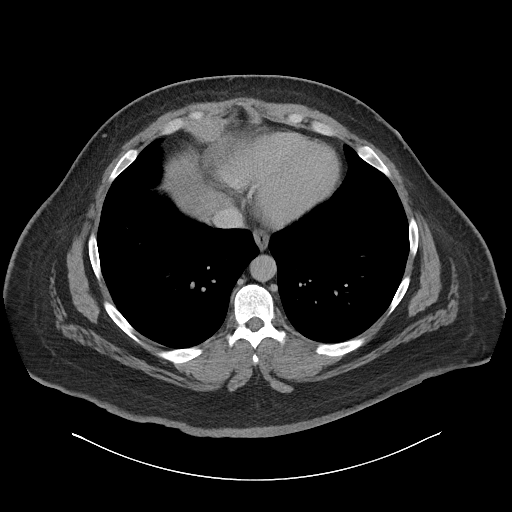

[Series 6: abdomen 3.0 mpr cor · coronal · 0.94mm/px · 3 of 147 slices shown]
[im 49/147  soft-tissue]
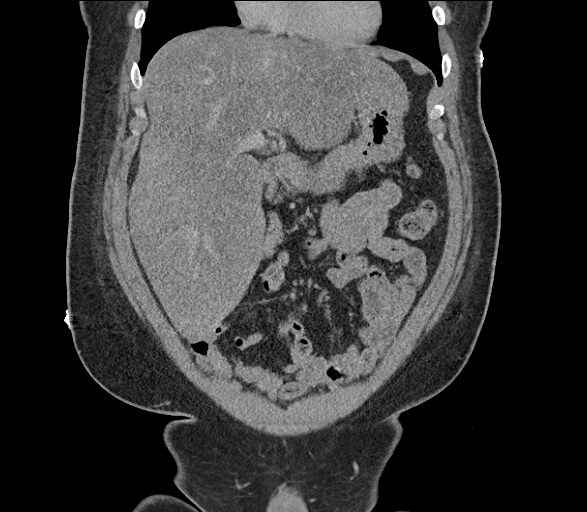
[im 65/147  soft-tissue]
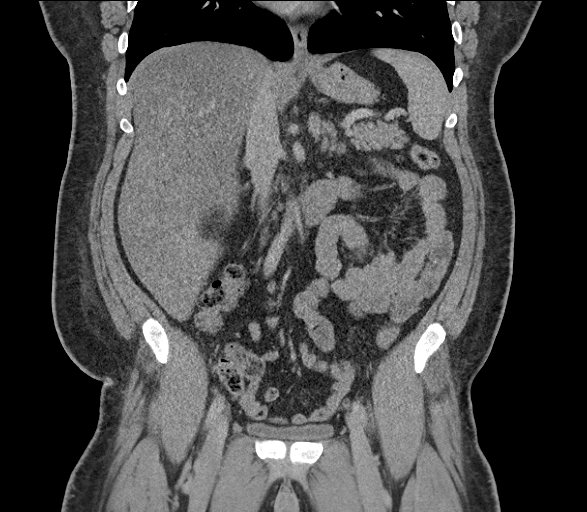
[im 82/147  soft-tissue]
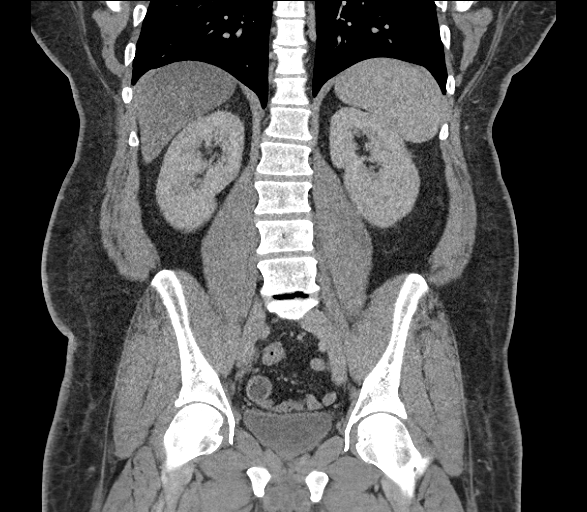

[16 of 46 positions shown; findings below may reference images not displayed]

FINDINGS: Lower chest: Calcified granuloma in the right lung base. Lung bases
otherwise clear.

Hepatobiliary: Diffuse fatty infiltration of the liver. No focal
liver abnormality is seen. No gallstones, gallbladder wall
thickening, or biliary dilatation.

Pancreas: Unremarkable. No pancreatic ductal dilatation or
surrounding inflammatory changes.

Spleen: Normal in size without focal abnormality.

Adrenals/Urinary Tract: No adrenal gland nodules. Small cyst in the
upper pole right kidney. Renal nephrograms are homogeneous and
symmetrical. No hydronephrosis or hydroureter. Bladder is
unremarkable.

Stomach/Bowel: Stomach, small bowel, and colon are mostly
decompressed. Decompression limits evaluation of the bowel wall but
no discrete areas of wall thickening or infiltration or identified.
Appendix is normal.

Vascular/Lymphatic: No significant vascular findings are present. No
enlarged abdominal or pelvic lymph nodes.

Reproductive: Prostate is unremarkable.

Other: No abdominal wall hernia or abnormality. No abdominopelvic
ascites.

Musculoskeletal: Postoperative L4-5 laminectomy. Mild degenerative
changes in the lower lumbar spine. No destructive bone lesions.
IMPRESSION: No acute process demonstrated in the abdomen or pelvis. No evidence
of bowel obstruction or inflammation. Diffuse fatty infiltration of
the liver.

## 2019-05-25 ENCOUNTER — Other Ambulatory Visit: Payer: Self-pay

## 2019-05-25 ENCOUNTER — Telehealth: Payer: 59 | Admitting: Adult Health Nurse Practitioner

## 2019-05-29 ENCOUNTER — Telehealth: Payer: 59 | Admitting: Physician Assistant

## 2019-05-29 DIAGNOSIS — U071 COVID-19: Secondary | ICD-10-CM

## 2019-05-29 NOTE — Progress Notes (Signed)
Your test for COVID-19 was positive, meaning that you were infected with the novel coronavirus and could give the germ to others.     Please continue isolation at home, for at least 10 days since the start of your symptoms and until you have had 24 hours with no fever (without taking a fever reducer) and with improving of symptoms.   If your symptoms started January 14 then, if you meet the above criteria, on January 25th  you can return to work.   Please continue good preventive care measures, including:  frequent hand-washing, avoid touching your face, cover coughs/sneezes, stay out of crowds and keep a 6 foot distance from others.    Recheck or go to the nearest hospital ED tent for re-assessment if you feel like you have severe symptoms.  The following symptoms may appear 2-14 days after exposure: . Fever . Cough . Shortness of breath or difficulty breathing . Chills . Repeated shaking with chills . Muscle pain . Headache . Sore throat . New loss of taste or smell . Fatigue . Congestion or runny nose . Nausea or vomiting . Diarrhea  Go to the nearest hospital ED for assessment if fever/cough/breathlessness are severe or illness seems like a threat to life.  It is vitally important that if you feel that you have an infection such as this virus or any other virus that you stay home and away from places where you may spread it to others.  You should avoid contact with people age 4 and older.   You may also take acetaminophen (Tylenol) as needed for fever.  Reduce your risk of any infection by using the same precautions used for avoiding the common cold or flu:  [name] Wash your hands often with soap and warm water for at least 20 seconds.  If soap and water are not readily available, use an alcohol-based hand sanitizer with at least 60% alcohol.  . If coughing or sneezing, cover your mouth and nose by coughing or sneezing into the elbow areas of your shirt or coat, into a tissue or into  your sleeve (not your hands). . Avoid shaking hands with others and consider head nods or verbal greetings only. . Avoid touching your eyes, nose, or mouth with unwashed hands.  . Avoid close contact with people who are sick. . Avoid places or events with large numbers of people in one location, like concerts or sporting events. . Carefully consider travel plans you have or are making. . If you are planning any travel outside or inside the [place], visit the CDC's Travelers' Health webpage for the latest health notices. . If you have some symptoms but not all symptoms, continue to monitor at home and seek medical attention if your symptoms worsen. . If you are having a medical emergency, call 911.  HOME CARE . Only take medications as instructed by your medical team. . Drink plenty of fluids and get plenty of rest. . A steam or ultrasonic humidifier can help if you have congestion.   GET HELP RIGHT AWAY IF YOU HAVE EMERGENCY WARNING SIGNS** FOR COVID-19. If you or someone is showing any of these signs seek emergency medical care immediately. Call 911 or proceed to your closest emergency facility if: . You develop worsening high fever. . Trouble breathing . Bluish lips or face . Persistent pain or pressure in the chest . New confusion . Inability to wake or stay awake . You cough up blood. . Your symptoms become more  severe  **This list is not all possible symptoms. Contact your medical provider for any symptoms that are sever or concerning to you.  MAKE SURE YOU   Understand these instructions.  Will watch your condition.  Will get help right away if you are not doing well or get worse.  Your e-visit answers were reviewed by a board certified advanced clinical practitioner to complete your personal care plan.  Depending on the condition, your plan could have included both over the counter or prescription medications.  If there is a problem please reply once you have received a response  from your provider.  Your safety is important to [place].  If you have drug allergies check your prescription carefully.    You can use MyChart to ask questions about today's visit, request a non-urgent call back, or ask for a work or school excuse for 24 hours related to this e-Visit. If it has been greater than 24 hours you will need to follow up with your provider, or enter a new e-Visit to address those concerns. You will get an e-mail in the next two days asking about your experience.  I hope that your e-visit has been valuable and will speed your recovery. Thank you for using e-visits.    6 minutes spent on chart

## 2019-08-12 ENCOUNTER — Telehealth: Payer: 59 | Admitting: Family

## 2019-08-12 DIAGNOSIS — J069 Acute upper respiratory infection, unspecified: Secondary | ICD-10-CM

## 2019-08-12 MED ORDER — FLUTICASONE PROPIONATE 50 MCG/ACT NA SUSP: 2.0000 | Freq: Every day | NASAL | 0 refills | Status: DC

## 2019-08-12 MED ORDER — BENZONATATE 100 MG PO CAPS: 100.0000 mg | ORAL_CAPSULE | Freq: Two times a day (BID) | ORAL | 0 refills | Status: DC | PRN

## 2019-08-12 NOTE — Progress Notes (Signed)
We are sorry you are not feeling well.  Here is how we plan to help!  Based on what you have shared with me, it looks like you may have a viral upper respiratory infection.  Upper respiratory infections are caused by a large number of viruses; however, rhinovirus is the most common cause.   Symptoms vary from person to person, with common symptoms including sore throat, cough, fatigue or lack of energy and feeling of general discomfort.  A low-grade fever of up to 100.4 may present, but is often uncommon.  Symptoms vary however, and are closely related to a person's age or underlying illnesses.  The most common symptoms associated with an upper respiratory infection are nasal discharge or congestion, cough, sneezing, headache and pressure in the ears and face.  These symptoms usually persist for about 3 to 10 days, but can last up to 2 weeks.  It is important to know that upper respiratory infections do not cause serious illness or complications in most cases.    Upper respiratory infections can be transmitted from person to person, with the most common method of transmission being a person's hands.  The virus is able to live on the skin and can infect other persons for up to 2 hours after direct contact.  Also, these can be transmitted when someone coughs or sneezes; thus, it is important to cover the mouth to reduce this risk.  To keep the spread of the illness at bay, good hand hygiene is very important.  This is an infection that is most likely caused by a virus. There are no specific treatments other than to help you with the symptoms until the infection runs its course.  We are sorry you are not feeling well.  Here is how we plan to help!   For nasal congestion, you may use an oral decongestants such as Mucinex D or if you have glaucoma or high blood pressure use plain Mucinex.  Saline nasal spray or nasal drops can help and can safely be used as often as needed for congestion.  For your congestion,  I have prescribed Fluticasone nasal spray one spray in each nostril twice a day  If you do not have a history of heart disease, hypertension, diabetes or thyroid disease, prostate/bladder issues or glaucoma, you may also use Sudafed to treat nasal congestion.  It is highly recommended that you consult with a pharmacist or your primary care physician to ensure this medication is safe for you to take.     If you have a cough, you may use cough suppressants such as Delsym and Robitussin.  If you have glaucoma or high blood pressure, you can also use Coricidin HBP.   For cough I have prescribed for you A prescription cough medication called Tessalon Perles 100 mg. You may take 1-2 capsules every 8 hours as needed for cough  If you have a sore or scratchy throat, use a saltwater gargle-  to  teaspoon of salt dissolved in a 4-ounce to 8-ounce glass of warm water.  Gargle the solution for approximately 15-30 seconds and then spit.  It is important not to swallow the solution.  You can also use throat lozenges/cough drops and Chloraseptic spray to help with throat pain or discomfort.  Warm or cold liquids can also be helpful in relieving throat pain.  For headache, pain or general discomfort, you can use Ibuprofen or Tylenol as directed.   Some authorities believe that zinc sprays or the use of   Echinacea may shorten the course of your symptoms.   HOME CARE . Only take medications as instructed by your medical team. . Be sure to drink plenty of fluids. Water is fine as well as fruit juices, sodas and electrolyte beverages. You may want to stay away from caffeine or alcohol. If you are nauseated, try taking small sips of liquids. How do you know if you are getting enough fluid? Your urine should be a pale yellow or almost colorless. . Get rest. . Taking a steamy shower or using a humidifier may help nasal congestion and ease sore throat pain. You can place a towel over your head and breathe in the steam  from hot water coming from a faucet. . Using a saline nasal spray works much the same way. . Cough drops, hard candies and sore throat lozenges may ease your cough. . Avoid close contacts especially the very young and the elderly . Cover your mouth if you cough or sneeze . Always remember to wash your hands.   GET HELP RIGHT AWAY IF: . You develop worsening fever. . If your symptoms do not improve within 10 days . You develop yellow or green discharge from your nose over 3 days. . You have coughing fits . You develop a severe head ache or visual changes. . You develop shortness of breath, difficulty breathing or start having chest pain . Your symptoms persist after you have completed your treatment plan  MAKE SURE YOU   Understand these instructions.  Will watch your condition.  Will get help right away if you are not doing well or get worse.  Your e-visit answers were reviewed by a board certified advanced clinical practitioner to complete your personal care plan. Depending upon the condition, your plan could have included both over the counter or prescription medications. Please review your pharmacy choice. If there is a problem, you may call our nursing hot line at and have the prescription routed to another pharmacy. Your safety is important to us. If you have drug allergies check your prescription carefully.   You can use MyChart to ask questions about today's visit, request a non-urgent call back, or ask for a work or school excuse for 24 hours related to this e-Visit. If it has been greater than 24 hours you will need to follow up with your provider, or enter a new e-Visit to address those concerns. You will get an e-mail in the next two days asking about your experience.  I hope that your e-visit has been valuable and will speed your recovery. Thank you for using e-visits.     Greater than 5 minutes, yet less than 10 minutes of time have been spent researching, coordinating,  and implementing care for this patient today.  Thank you for the details you included in the comment boxes. Those details are very helpful in determining the best course of treatment for you and help us to provide the best care.  

## 2019-09-19 ENCOUNTER — Telehealth: Payer: 59 | Admitting: Nurse Practitioner

## 2019-09-19 DIAGNOSIS — K047 Periapical abscess without sinus: Secondary | ICD-10-CM | POA: Diagnosis not present

## 2019-09-19 MED ORDER — CLINDAMYCIN HCL 300 MG PO CAPS: 300.0000 mg | ORAL_CAPSULE | Freq: Four times a day (QID) | ORAL | 0 refills | Status: DC

## 2019-09-19 NOTE — Progress Notes (Signed)
We are sorry that you are not feeling well.  Here is how we plan to help!  Based on what you have described, it sounds like you have a tooth abscess. I have prescribed cleocin 300mg  1 po qid FOR 10 DAYS. You also need to see a dentist as soon as possible. I am not able to prescribe pain medication in an eviit, o you will have to take motrin or tylenol OTC. Home Care:  Only take medications as instructed by your medical team.  Complete the entire course of an antibiotic.  Do not take these medications with alcohol.  Get Help Right Away If:  You develop worsening fever or sinus pain.  You develop a severe head ache or visual changes.  Your symptoms persist after you have completed your treatment plan.  Make sure you  Understand these instructions.  Will watch your condition.  Will get help right away if you are not doing well or get worse.  Your e-visit answers were reviewed by a board certified advanced clinical practitioner to complete your personal care plan.  Depending on the condition, your plan could have included both over the counter or prescription medications.  If there is a problem please reply  once you have received a response from your provider.  Your safety is important to [place].  If you have drug allergies check your prescription carefully.    You can use MyChart to ask questions about today's visit, request a non-urgent call back, or ask for a work or school excuse for 24 hours related to this e-Visit. If it has been greater than 24 hours you will need to follow up with your provider, or enter a new e-Visit to address those concerns.  You will get an e-mail in the next two days asking about your experience.  I hope that your e-visit has been valuable and will speed your recovery. Thank you for using e-visits.  5-10 minutes spent reviewing and documenting in chart.

## 2019-10-01 ENCOUNTER — Telehealth: Payer: 59 | Admitting: Family

## 2019-10-01 DIAGNOSIS — J019 Acute sinusitis, unspecified: Secondary | ICD-10-CM | POA: Diagnosis not present

## 2019-10-01 NOTE — Progress Notes (Signed)
We are sorry that you are not feeling well.  Here is how we plan to help!  Based on what you have shared with me it looks like you have sinusitis.  Sinusitis is inflammation and infection in the sinus cavities of the head.  Based on your presentation I believe you most likely have Acute Bacterial Sinusitis.  This is an infection caused by bacteria and is treated with antibiotics. I have sent a work note to your my chart.  Sinus infections are not as easily transmitted as other respiratory infection, however we still recommend that you avoid close contact with loved ones, especially the very young and elderly.  Remember to wash your hands thoroughly throughout the day as this is the number one way to prevent the spread of infection!  Home Care:  Only take medications as instructed by your medical team.  Complete the entire course of an antibiotic.  Do not take these medications with alcohol.  A steam or ultrasonic humidifier can help congestion.  You can place a towel over your head and breathe in the steam from hot water coming from a faucet.  Avoid close contacts especially the very young and the elderly.  Cover your mouth when you cough or sneeze.  Always remember to wash your hands.  Get Help Right Away If:  You develop worsening fever or sinus pain.  You develop a severe head ache or visual changes.  Your symptoms persist after you have completed your treatment plan.  Make sure you  Understand these instructions.  Will watch your condition.  Will get help right away if you are not doing well or get worse.  Your e-visit answers were reviewed by a board certified advanced clinical practitioner to complete your personal care plan.  Depending on the condition, your plan could have included both over the counter or prescription medications.  If there is a problem please reply  once you have received a response from your provider.  Your safety is important to [place].  If you have  drug allergies check your prescription carefully.    You can use MyChart to ask questions about today's visit, request a non-urgent call back, or ask for a work or school excuse for 24 hours related to this e-Visit. If it has been greater than 24 hours you will need to follow up with your provider, or enter a new e-Visit to address those concerns.  You will get an e-mail in the next two days asking about your experience.  I hope that your e-visit has been valuable and will speed your recovery. Thank you for using e-visits.   Approximately 5 minutes was spent documenting and reviewing patient's chart.

## 2019-10-05 ENCOUNTER — Telehealth: Payer: 59 | Admitting: Nurse Practitioner

## 2019-10-05 ENCOUNTER — Other Ambulatory Visit: Payer: Self-pay

## 2019-10-05 ENCOUNTER — Ambulatory Visit (HOSPITAL_COMMUNITY)
Admission: EM | Admit: 2019-10-05 | Discharge: 2019-10-05 | Disposition: A | Discharge status: Home / Self Care | Payer: 59 | Attending: Family Medicine | Admitting: Family Medicine

## 2019-10-05 ENCOUNTER — Encounter (HOSPITAL_COMMUNITY): Payer: Self-pay

## 2019-10-05 DIAGNOSIS — I10 Essential (primary) hypertension: Secondary | ICD-10-CM

## 2019-10-05 DIAGNOSIS — M5412 Radiculopathy, cervical region: Secondary | ICD-10-CM | POA: Diagnosis not present

## 2019-10-05 DIAGNOSIS — E119 Type 2 diabetes mellitus without complications: Secondary | ICD-10-CM

## 2019-10-05 DIAGNOSIS — M546 Pain in thoracic spine: Secondary | ICD-10-CM

## 2019-10-05 MED ORDER — TIZANIDINE HCL 4 MG PO TABS: 4.0000 mg | ORAL_TABLET | Freq: Four times a day (QID) | ORAL | 0 refills | Status: DC | PRN

## 2019-10-05 MED ORDER — LISINOPRIL 20 MG PO TABS: 20.0000 mg | ORAL_TABLET | Freq: Every day | ORAL | 1 refills | Status: DC

## 2019-10-05 MED ORDER — AMLODIPINE BESYLATE 10 MG PO TABS: 10.0000 mg | ORAL_TABLET | Freq: Every day | ORAL | 2 refills | Status: DC

## 2019-10-05 MED ORDER — AMLODIPINE BESYLATE 10 MG PO TABS: 10.0000 mg | ORAL_TABLET | Freq: Every day | ORAL | 1 refills | Status: DC

## 2019-10-05 MED ORDER — IBUPROFEN 800 MG PO TABS: 800.0000 mg | ORAL_TABLET | Freq: Three times a day (TID) | ORAL | 0 refills | Status: DC

## 2019-10-05 MED ORDER — METHYLPREDNISOLONE 4 MG PO TBPK: ORAL_TABLET | ORAL | 0 refills | Status: DC

## 2019-10-05 NOTE — Progress Notes (Signed)
Based on what you shared with me, I feel your condition warrants further evaluation and I recommend that you be seen for a face to face office visit. It is difficult to diagnosis your symptoms without a face to face visit.  Based on your previous history of back pain and previous surgery, I do not feel this would be the best method of treatment for you at this time.    NOTE: If you entered your credit card information for this eVisit, you will not be charged. You may see a "hold" on your card for the $35 but that hold will drop off and you will not have a charge processed.   If you are having a true medical emergency please call 911.      For an urgent face to face visit, [facility] has five urgent care centers for your convenience:      NEW:  [facility] [place] at [place] Get Driving Directions [id number] [address] [place], [place] [address] . 10 am - 6pm Monday - Friday    [facility]) Get Driving Directions [id number] [address] [place], [place] [address] . 10 am to 8 pm Monday-Friday . 12 pm to 8 pm [facility] at [facility] Get Driving Directions [id number] [place], [place], [place] [address] . 8 am to 8 pm Monday-Friday . 9 am to 6 pm Saturday . 11 am to 6 pm Sunday     [facility] at [facility] Get Driving Directions  [id number] [address] [place], [place] [address] . 8 am to 8 pm Monday-Friday . 8 am to 4 pm [facility] at [place] Get Driving Directions [id number] [place] [place], [place] [address] . 12 pm to 6 pm Monday-Friday      Your e-visit answers were reviewed by a board certified advanced clinical practitioner to complete your personal care plan.  Thank you for using e-Visits.   I have spent at least 5 minutes reviewing and documenting  in the patient's chart.

## 2019-10-05 NOTE — ED Triage Notes (Signed)
Patient reporting one week of neck pain radiating into right arm. Majority of pain in right shoulder and elbow. Reports feeling tingly, numbness in right arm. 5/5 arm strength bilaterally. Started after sleeping, no known injury. Patient states his blood pressures run in the 180's over 110's when he does not take his medication. Has not taken his medication in over a week.

## 2019-10-05 NOTE — ED Provider Notes (Signed)
[place]    CSN: [id number] Arrival date & time: 10/05/19  1908      History   Chief Complaint Chief Complaint  Patient presents with  . neck pain    HPI [name] is a 39 y.o. male.   HPI   Patient woke up with some numbness in his right hand about a week ago.  He has pain in his lower neck.  Some stiffness of the muscles.  He states that he did not have any accident or injury.  No overuse.  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  He has never had any neck problems before.  He does have degenerative lumbar disc and has had back surgery.  He has never had his neck x-rayed.  He has numbness in all of his fingers but mostly in the fourth and fifth.  He still has normal strength and dexterity in the right hand. Patient is a diabetic.  Hemoglobin A1c in the 9 range.  States that sugars are not well controlled. We discussed that with diabetic she had to use NSAID drugs with caution because of possible kidney impairment We discussed that with diabetes you also have to be careful using steroids because they can increase the glucose  Past Medical History:  Diagnosis Date  . Allergies   . Allergy   . Anemia   . Anxiety   . Depression   . Diabetes mellitus   . GERD (gastroesophageal reflux disease)   . Hyperlipidemia   . Hypertension   . Lumbar disc herniation   . 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 PTSD (post-traumatic stress disorder)   . Scoliosis   . Sleep apnea     Patient Active Problem List   Diagnosis Date Noted  . Hypertension associated with diabetes ([place]) 04/17/2018  . Dyslipidemia 04/17/2018  . PTSD (post-traumatic stress disorder) 08/11/2016  . Spinal stenosis, lumbar region with neurogenic claudication 05/05/2016  . Dysthymia 09/02/2015  . Chronic low back pain 09/02/2015  . Dyslipidemia associated with type 2 diabetes mellitus ([place]) 03/11/2008  . UNSPECIFIED ANEMIA 03/11/2008     Past Surgical History:  Procedure Laterality Date  . LUMBAR LAMINECTOMY/DECOMPRESSION MICRODISCECTOMY Left 05/05/2016   Procedure: CENTRAL DECOMPRESSION L4-L5 AND FORAMINOTOMY FOR L4 ROOT AND L5 ROOT ON THE LEFT;  Surgeon: Latanya Maudlin, MD;  Location: [facility];  Service: Orthopedics;  Laterality: Left;  . NO PAST SURGERIES         Home Medications    Prior to Admission medications   Medication Sig Start Date End Date Taking? Authorizing Provider  acetaminophen (TYLENOL) 500 MG tablet Take 1,000 mg by mouth every 6 (six) hours as needed for mild pain.    [provider]  albuterol (PROAIR HFA) 108 (90 Base) MCG/ACT inhaler Inhale 1-2 puffs into the lungs every 6 (six) hours as needed for wheezing or shortness of breath. 03/07/19   Zigmund Gottron, NP  amLODipine (NORVASC) 10 MG tablet Take 1 tablet (10 mg total) by mouth daily. 10/05/19   Raylene Everts, MD  aspirin EC 81 MG tablet Take 1 tablet (81 mg total) by mouth daily. 07/11/14   Shawnee Knapp, MD  blood glucose meter kit and supplies Dispense based on patient and insurance preference. Use up to four times daily as directed. (FOR ICD-10 E10.9, E11.9). 01/16/19   Eugenie Filler, MD  doxylamine, Sleep, (UNISOM) 25 MG tablet  Take 25 mg by mouth at bedtime as needed.    [provider]  fluticasone (FLONASE) 50 MCG/ACT nasal spray Place 2 sprays into both nostrils daily. 08/12/19   Kennyth Arnold, FNP  glipiZIDE (GLUCOTROL) 5 MG tablet Take 1 tablet (5 mg total) by mouth 2 (two) times daily before a meal. 01/30/19 04/30/19  Horald Pollen, MD  ibuprofen (ADVIL) 800 MG tablet Take 1 tablet (800 mg total) by mouth 3 (three) times daily. 10/05/19   Raylene Everts, MD  lisinopril (ZESTRIL) 20 MG tablet Take 1 tablet (20 mg total) by mouth daily. 10/05/19   Raylene Everts, MD  loratadine (CLARITIN) 10 MG tablet Take 10 mg by mouth as needed for allergies.    [provider]  methylPREDNISolone  (MEDROL DOSEPAK) 4 MG TBPK tablet TAD 10/05/19   Raylene Everts, MD  nitroGLYCERIN (NITROSTAT) 0.4 MG SL tablet Place 1 tablet (0.4 mg total) under the tongue every 5 (five) minutes as needed for chest pain. 01/16/19   Eugenie Filler, MD  rosuvastatin (CRESTOR) 20 MG tablet Take 1 tablet (20 mg total) by mouth daily. 01/16/19   Eugenie Filler, MD  tiZANidine (ZANAFLEX) 4 MG tablet Take 1-2 tablets (4-8 mg total) by mouth every 6 (six) hours as needed for muscle spasms. 10/05/19   Raylene Everts, MD    Family History Family History  Problem Relation Age of Onset  . Stroke Maternal Grandmother   . Colon cancer Neg Hx   . Esophageal cancer Neg Hx   . Rectal cancer Neg Hx   . Stomach cancer Neg Hx     Social History Social History   Tobacco Use  . Smoking status: Former Smoker    Types: Cigars  . Smokeless tobacco: Current User    Types: Snuff, Chew  Substance Use Topics  . Alcohol use: Yes    Alcohol/week: 5.0 standard drinks    Types: 5 Shots of liquor per week    Comment: few times year  . Drug use: No     Allergies   Amoxicillin, Penicillins, Metformin and related, and Other   Review of Systems Review of Systems  Musculoskeletal: Positive for neck pain and neck stiffness.  Neurological: Positive for numbness. Negative for weakness.     Physical Exam Triage Vital Signs ED Triage Vitals  Enc Vitals Group     BP 10/05/19 1926 (!) 181/114     Pulse Rate 10/05/19 1926 (!) 110     Resp 10/05/19 1926 14     Temp 10/05/19 1926 98.6 F (37 C)     Temp Source 10/05/19 1926 Oral     SpO2 10/05/19 1926 98 %     Weight --      Height --      Head Circumference --      Peak Flow --      Pain Score 10/05/19 1928 6     Pain Loc --      Pain Edu? --      Excl. in [place]? --    No data found.  Updated Vital Signs BP (!) 181/114 (BP Location: Right Arm) Comment: pt reports not taking his blood pressure medication for over a week  Pulse (!) 110   Temp 98.6 F  (37 C) (Oral)   Resp 14   SpO2 98%      Physical Exam Constitutional:      General: He is not in acute distress.  Appearance: He is well-developed. He is obese.  HENT:     Head: Normocephalic and atraumatic.     Mouth/Throat:     Comments: Mask is in place Eyes:     Conjunctiva/sclera: Conjunctivae normal.     Pupils: Pupils are equal, round, and reactive to light.  Neck:   Cardiovascular:     Rate and Rhythm: Normal rate.  Pulmonary:     Effort: Pulmonary effort is normal. No respiratory distress.  Abdominal:     General: There is no distension.     Palpations: Abdomen is soft.  Musculoskeletal:        General: Normal range of motion.     Cervical back: Normal range of motion. No rigidity. Normal range of motion.     Comments: Strength sensation range of motion reflexes are normal in both upper extremities no focal deficits  Skin:    General: Skin is warm and dry.  Neurological:     Mental Status: He is alert.  Psychiatric:        Mood and Affect: Mood normal.        Behavior: Behavior normal.      UC Treatments / Results  Labs (all labs ordered are listed, but only abnormal results are displayed) Labs Reviewed - No data to display  EKG   Radiology No results found.  Procedures Procedures (including critical care time)  Medications Ordered in UC Medications - No data to display  Initial Impression / Assessment and Plan / UC Course  I have reviewed the triage vital signs and the nursing notes.  Pertinent labs & imaging results that were available during my care of the patient were reviewed by me and considered in my medical decision making (see chart for details).      Final Clinical Impressions(s) / UC Diagnoses   Final diagnoses:  Cervical radiculitis  Essential hypertension  Type 2 diabetes mellitus without complication, without long-term current use of insulin (HCC)  Elevated blood pressure.  Patient states he ran out of his blood pressure  a week ago.  The importance of taking blood pressure medicine daily is discussed with him.  Refills are provided to him Importance of improved diabetes control while on steroids is also discussed   Discharge Instructions     Take the medrol pak as directed Take the ibuprofen after the medrol  Take the tizanidine as needed This is useful when you sleep Ice or heat to painful muscles Gently stretching of neck Call your PCP if not improving by monday for referral PT/Chiro     ED Prescriptions    Medication Sig Dispense Auth. Provider   methylPREDNISolone (MEDROL DOSEPAK) 4 MG TBPK tablet TAD 21 tablet Raylene Everts, MD   ibuprofen (ADVIL) 800 MG tablet Take 1 tablet (800 mg total) by mouth 3 (three) times daily. 21 tablet Raylene Everts, MD   tiZANidine (ZANAFLEX) 4 MG tablet Take 1-2 tablets (4-8 mg total) by mouth every 6 (six) hours as needed for muscle spasms. 21 tablet Raylene Everts, MD   amLODipine (NORVASC) 10 MG tablet Take 1 tablet (10 mg total) by mouth daily. 30 tablet Raylene Everts, MD   lisinopril (ZESTRIL) 20 MG tablet Take 1 tablet (20 mg total) by mouth daily. 30 tablet Raylene Everts, MD     PDMP not reviewed this encounter.   Raylene Everts, MD 10/05/19 2140

## 2019-10-05 NOTE — Discharge Instructions (Addendum)
Take the medrol pak as directed Take the ibuprofen after the medrol  Take the tizanidine as needed This is useful when you sleep Ice or heat to painful muscles Gently stretching of neck Call your PCP if not improving by monday for referral PT/Chiro

## 2019-10-13 ENCOUNTER — Telehealth: Payer: 59 | Admitting: Nurse Practitioner

## 2019-10-13 DIAGNOSIS — M5412 Radiculopathy, cervical region: Secondary | ICD-10-CM

## 2019-10-13 NOTE — Progress Notes (Signed)
Based on what you shared with me it looks like you have cervical radiculopathy,that should be evaluated in a face to face office visit. According to your chart, you were in the ED on 10/05/19 with pain. You were given steroids  In which you just completed. There is nothing more that can be done in an evisit. You will either need to go back to ED or rest till Monday and call your PCP first thing Monday morning. I am sorry that I am not able to help you any.    NOTE: If you entered your credit card information for this eVisit, you will not be charged. You may see a "hold" on your card for the $35 but that hold will drop off and you will not have a charge processed.  If you are having a true medical emergency please call 911.     For an urgent face to face visit, [facility] has four urgent care centers for your convenience:    [facility]    [phone]                  Get Driving Directions  [address], [place] [address]  10 am to 8 pm Monday-Friday  12 pm to 8 pm [facility] at [facility]  [phone]                  Get Driving Directions  [address], [place], [place] [address]  8 am to 8 pm Monday-Friday  9 am to 6 pm Saturday  11 am to 6 pm Sunday    [facility] [place]                  Get Driving Directions   [address] [place], [place] [address]  8 am to 8 pm Monday-Friday  8 am to 4 pm [facility] [place]                    Get Driving Directions  [phone]  [address] [place], [place] [address]   Monday-Friday, 12 PM to 6 PM    Your e-visit answers were reviewed by a board certified advanced clinical practitioner to complete your personal care plan.  Thank you for using e-Visits.

## 2019-12-12 ENCOUNTER — Encounter: Payer: Self-pay | Admitting: Emergency Medicine

## 2020-01-06 IMAGING — DX DG CHEST 2V
2 series · 2 of 2 positions shown · non-contrast
Comparison: 01/15/2019

CLINICAL DATA: Shortness of breath.  Chest pain.

EXAM:
CHEST - 2 VIEW

[chest pa]
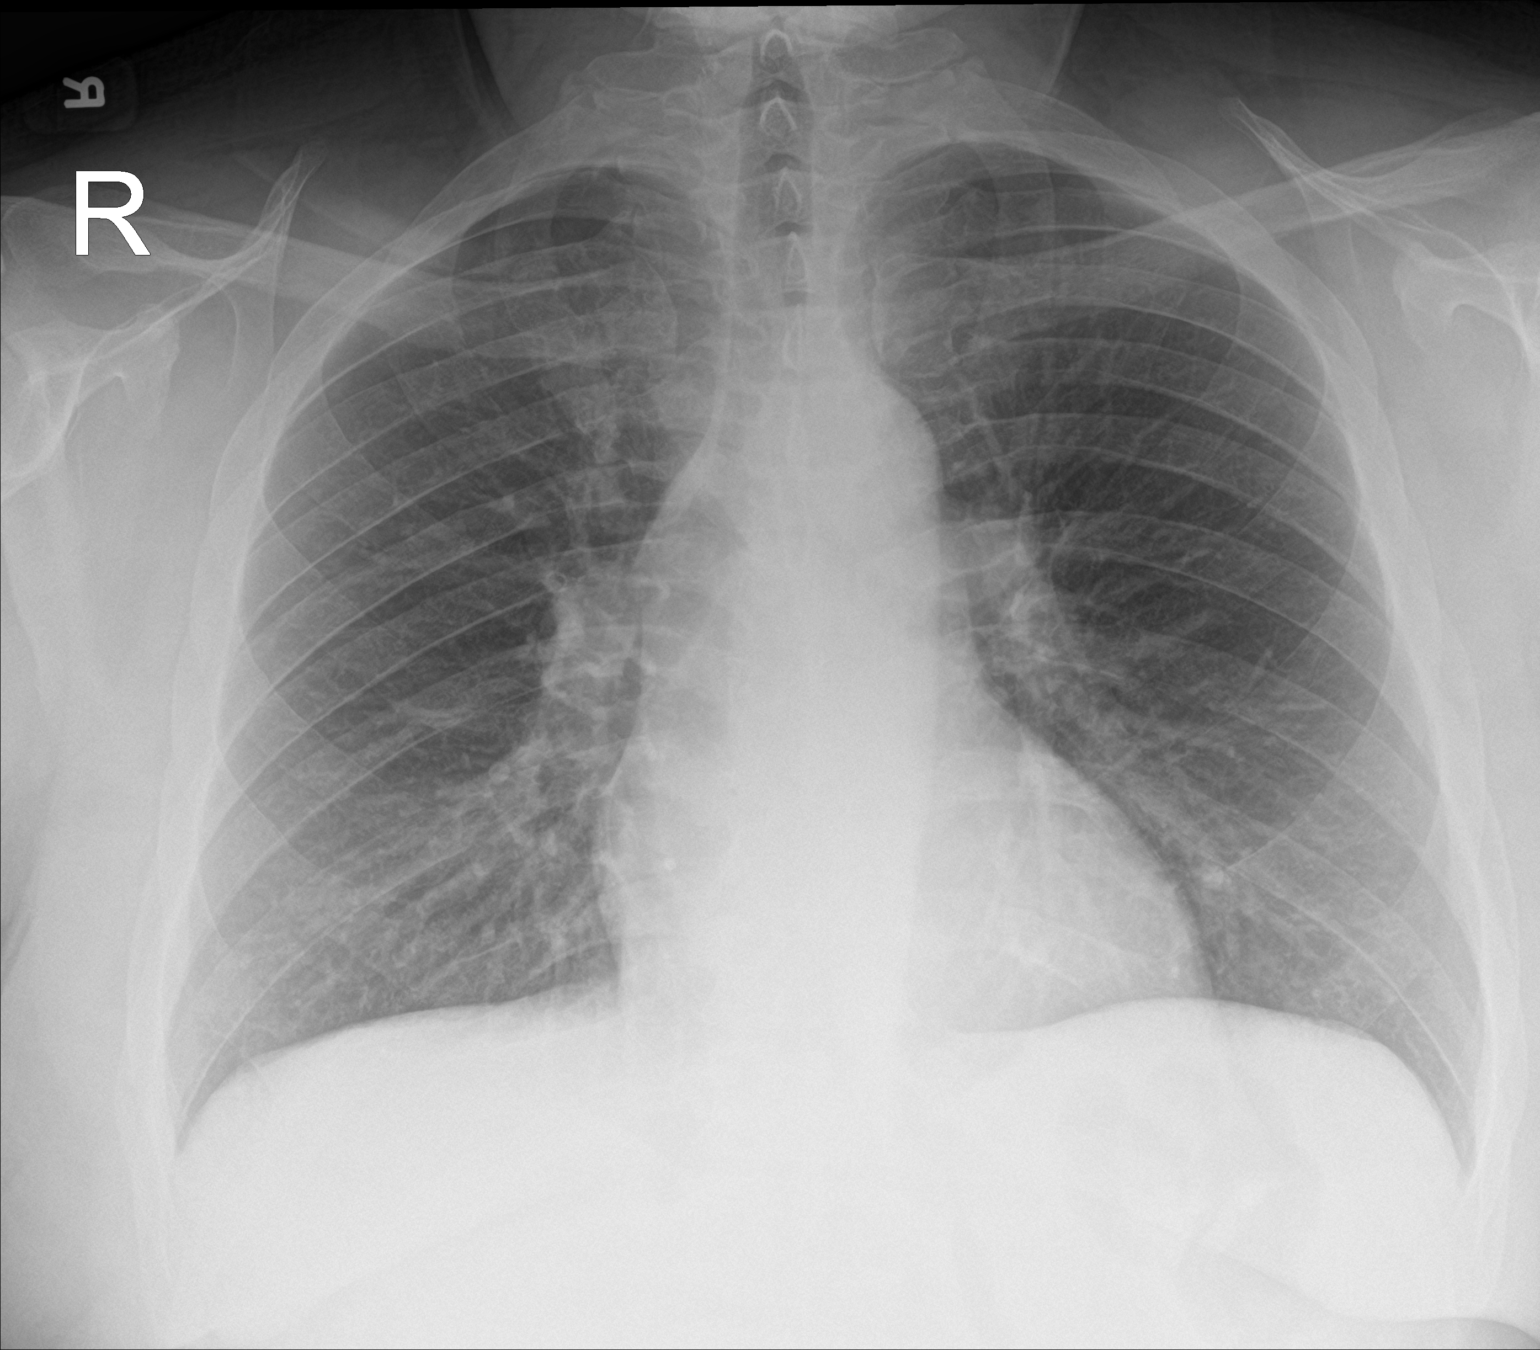

[chest lat]
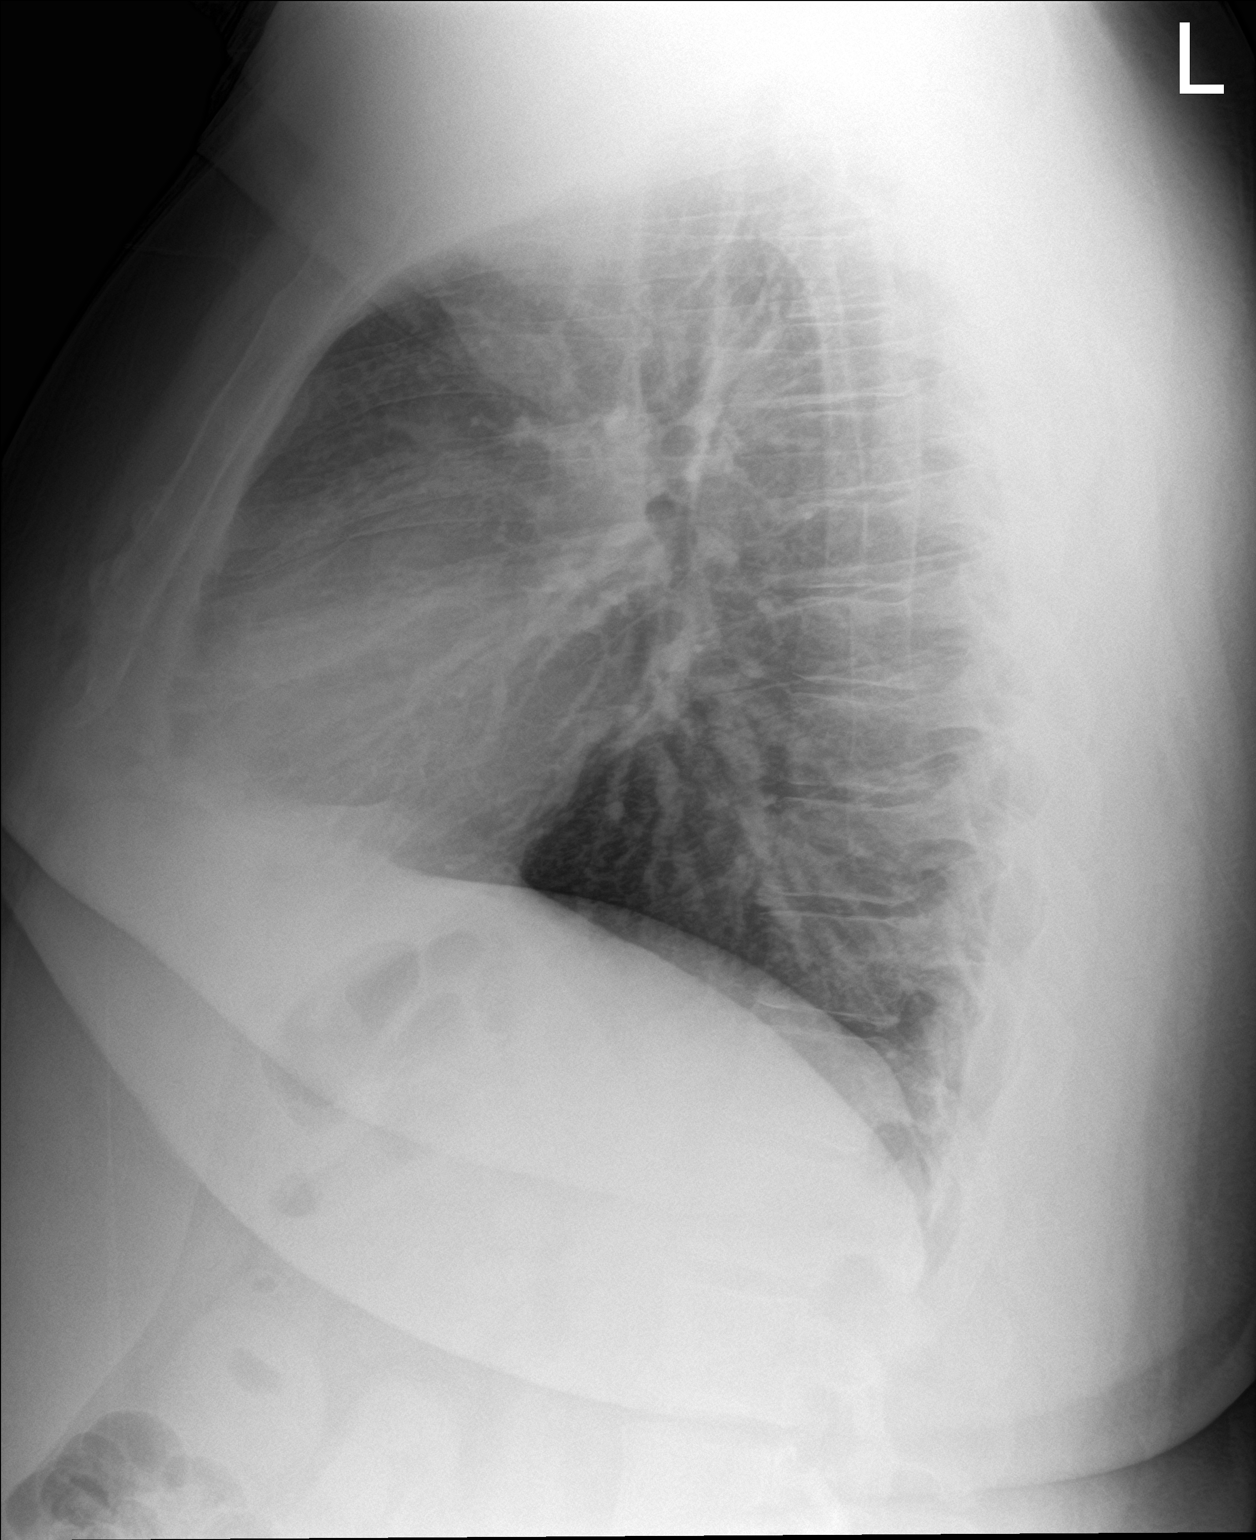

[2 of 2 positions shown; findings below may reference images not displayed]

FINDINGS: The heart size and mediastinal contours are within normal limits.
Both lungs are clear. The visualized skeletal structures are
unremarkable.
IMPRESSION: Normal exam.

## 2020-01-07 ENCOUNTER — Encounter (HOSPITAL_COMMUNITY): Payer: Self-pay | Admitting: Emergency Medicine

## 2020-01-07 ENCOUNTER — Ambulatory Visit (HOSPITAL_COMMUNITY)
Admission: EM | Admit: 2020-01-07 | Discharge: 2020-01-07 | Disposition: A | Discharge status: Home / Self Care | Payer: 59 | Attending: Family Medicine | Admitting: Family Medicine

## 2020-01-07 ENCOUNTER — Other Ambulatory Visit: Payer: Self-pay

## 2020-01-07 DIAGNOSIS — L97521 Non-pressure chronic ulcer of other part of left foot limited to breakdown of skin: Secondary | ICD-10-CM | POA: Diagnosis not present

## 2020-01-07 MED ORDER — SULFAMETHOXAZOLE-TRIMETHOPRIM 800-160 MG PO TABS: 1.0000 | ORAL_TABLET | Freq: Two times a day (BID) | ORAL | 0 refills | Status: AC

## 2020-01-07 NOTE — ED Triage Notes (Signed)
Pt presents with left foot pain. States he thinks he stepped on a nailed about a month ago at home. C/o of swelling, drainage, and warm to touch xs 7-10 days ago.   States has a hx of neuropathy and unable to feel his foot.

## 2020-01-07 NOTE — ED Provider Notes (Signed)
[place]    CSN: [id number] Arrival date & time: 01/07/20  0807      History   Chief Complaint Chief Complaint  Patient presents with  . Foot Pain    HPI DWYANE [name] is a 39 y.o. male.   Patient presenting today with a left foot ulcer, states about 1 month ago he stepped on a nail below base of left great toe and now the past week has been having worsening swelling, pain, drainage and notes a hole in the area that has been getting larger. Hx of peripheral neuropathy so states he didn't really notice it until last week. Denies fever, chills, sweats, nausea or vomiting. Has used peroxide a couple of times on it but otherwise not treating the area with anything.    Foot Pain    Past Medical History:  Diagnosis Date  . Allergies   . Allergy   . Anemia   . Anxiety   . Depression   . Diabetes mellitus   . GERD (gastroesophageal reflux disease)   . Hyperlipidemia   . Hypertension   . Lumbar disc herniation   . Neuromuscular disorder ([place])    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   . PTSD (post-traumatic stress disorder)   . Scoliosis   . Sleep apnea     Patient Active Problem List   Diagnosis Date Noted  . Hypertension associated with diabetes ([place]) 04/17/2018  . Dyslipidemia 04/17/2018  . PTSD (post-traumatic stress disorder) 08/11/2016  . Spinal stenosis, lumbar region with neurogenic claudication 05/05/2016  . Dysthymia 09/02/2015  . Chronic low back pain 09/02/2015  . Dyslipidemia associated with type 2 diabetes mellitus ([place]) 03/11/2008  . UNSPECIFIED ANEMIA 03/11/2008    Past Surgical History:  Procedure Laterality Date  . LUMBAR LAMINECTOMY/DECOMPRESSION MICRODISCECTOMY Left 05/05/2016   Procedure: CENTRAL DECOMPRESSION L4-L5 AND FORAMINOTOMY FOR L4 ROOT AND L5 ROOT ON THE LEFT;  Surgeon: Latanya Maudlin, MD;  Location: [facility];  Service: Orthopedics;  Laterality: Left;  . NO PAST SURGERIES         Home Medications    Prior to Admission  medications   Medication Sig Start Date End Date Taking? Authorizing Provider  acetaminophen (TYLENOL) 500 MG tablet Take 1,000 mg by mouth every 6 (six) hours as needed for mild pain.    [provider]  albuterol (PROAIR HFA) 108 (90 Base) MCG/ACT inhaler Inhale 1-2 puffs into the lungs every 6 (six) hours as needed for wheezing or shortness of breath. 03/07/19   Zigmund Gottron, NP  amLODipine (NORVASC) 10 MG tablet Take 1 tablet (10 mg total) by mouth daily. 10/05/19   Raylene Everts, MD  aspirin EC 81 MG tablet Take 1 tablet (81 mg total) by mouth daily. 07/11/14   Shawnee Knapp, MD  blood glucose meter kit and supplies Dispense based on patient and insurance preference. Use up to four times daily as directed. (FOR ICD-10 E10.9, E11.9). 01/16/19   Eugenie Filler, MD  doxylamine, Sleep, (UNISOM) 25 MG tablet Take 25 mg by mouth at bedtime as needed.    [provider]  FLUoxetine (PROZAC) 10 MG tablet     [provider]  fluticasone (FLONASE) 50 MCG/ACT nasal spray Place 2 sprays into both nostrils daily. 08/12/19   Kennyth Arnold, FNP  glipiZIDE (GLUCOTROL) 5 MG tablet Take 1 tablet (5 mg total) by mouth 2 (two) times daily before a meal. 01/30/19 04/30/19  Horald Pollen, MD  ibuprofen (ADVIL) 800  MG tablet Take 1 tablet (800 mg total) by mouth 3 (three) times daily. 10/05/19   Raylene Everts, MD  lisinopril (ZESTRIL) 20 MG tablet Take 1 tablet (20 mg total) by mouth daily. 10/05/19   Raylene Everts, MD  loratadine (CLARITIN) 10 MG tablet Take 10 mg by mouth as needed for allergies.    [provider]  methylPREDNISolone (MEDROL DOSEPAK) 4 MG TBPK tablet TAD 10/05/19   Raylene Everts, MD  nitroGLYCERIN (NITROSTAT) 0.4 MG SL tablet Place 1 tablet (0.4 mg total) under the tongue every 5 (five) minutes as needed for chest pain. 01/16/19   Eugenie Filler, MD  rosuvastatin (CRESTOR) 20 MG tablet Take 1 tablet (20 mg total) by mouth daily. 01/16/19    Eugenie Filler, MD  sulfamethoxazole-trimethoprim (BACTRIM DS) 800-160 MG tablet Take 1 tablet by mouth 2 (two) times daily for 7 days. 01/07/20 01/14/20  Volney American, PA-C  tiZANidine (ZANAFLEX) 4 MG tablet Take 1-2 tablets (4-8 mg total) by mouth every 6 (six) hours as needed for muscle spasms. 10/05/19   Raylene Everts, MD  traZODone (DESYREL) 100 MG tablet TAKE 1 TABLET BY MOUTH EVERY DAY AT BEDTIME AS NEEDED FOR SLEEP 12/10/19   [provider]    Family History Family History  Problem Relation Age of Onset  . Stroke Maternal Grandmother   . Colon cancer Neg Hx   . Esophageal cancer Neg Hx   . Rectal cancer Neg Hx   . Stomach cancer Neg Hx     Social History Social History   Tobacco Use  . Smoking status: Former Smoker    Types: Cigars  . Smokeless tobacco: Current User    Types: Snuff, Chew  Substance Use Topics  . Alcohol use: Yes    Alcohol/week: 5.0 standard drinks    Types: 5 Shots of liquor per week    Comment: few times year  . Drug use: No     Allergies   Amoxicillin, Penicillins, Metformin and related, and Other   Review of Systems Review of Systems  Constitutional: Negative.   HENT: Negative.   Eyes: Negative.   Respiratory: Negative.   Cardiovascular: Negative.   Gastrointestinal: Negative.   Musculoskeletal: Negative.   Skin: Positive for wound (left foot).  Neurological: Positive for numbness.  Psychiatric/Behavioral: Negative.      Physical Exam Triage Vital Signs ED Triage Vitals  Enc Vitals Group     BP      Pulse      Resp      Temp      Temp src      SpO2      Weight      Height      Head Circumference      Peak Flow      Pain Score      Pain Loc      Pain Edu?      Excl. in [place]?    No data found.  Updated Vital Signs BP (!) 158/111 (BP Location: Right Arm)   Pulse 94   Temp 98.6 F (37 C) (Oral)   Resp 18   SpO2 98%   Visual Acuity Right Eye Distance:   Left Eye Distance:   Bilateral  Distance:    Right Eye Near:   Left Eye Near:    Bilateral Near:     Physical Exam Vitals and nursing note reviewed.  Constitutional:      Appearance: Normal appearance.  HENT:     Head: Atraumatic.  Eyes:     Extraocular Movements: Extraocular movements intact.     Conjunctiva/sclera: Conjunctivae normal.  Cardiovascular:     Rate and Rhythm: Normal rate and regular rhythm.  Pulmonary:     Effort: Pulmonary effort is normal.     Breath sounds: Normal breath sounds.  Musculoskeletal:        General: Normal range of motion.     Cervical back: Normal range of motion and neck supple.  Skin:    General: Skin is warm.     Comments: Superficial ulcer without active drainage left plantar surface of foot below base of great toe, edematous in the area surrounding the ulcer. No streaking, ttp  Neurological:     General: No focal deficit present.     Mental Status: He is oriented to person, place, and time.     Sensory: Sensory deficit (decreased sensation to light touch b/l feet) present.  Psychiatric:        Mood and Affect: Mood normal.        Thought Content: Thought content normal.        Judgment: Judgment normal.      UC Treatments / Results  Labs (all labs ordered are listed, but only abnormal results are displayed) Labs Reviewed - No data to display  EKG   Radiology No results found.  Procedures Procedures (including critical care time)  Medications Ordered in UC Medications - No data to display  Initial Impression / Assessment and Plan / UC Course  I have reviewed the triage vital signs and the nursing notes.  Pertinent labs & imaging results that were available during my care of the patient were reviewed by me and considered in my medical decision making (see chart for details).     Currently infected, but afebrile and no evidence of cellulitis. Given bactrim to take for 1 week, foot wrapped and dressed with triple antibiotic ointment, soft gauze, and ace  wrap. Perform foot soaks several times daily, keep dressed as we did today, and follow up as scheduled with PCP on Thursday. Return precautions reviewed, particularly for fever, chills, sweats, worsening drainage, redness in meantime.  Final Clinical Impressions(s) / UC Diagnoses   Final diagnoses:  Ulcer of left foot, limited to breakdown of skin [facility])   Discharge Instructions   None    ED Prescriptions    Medication Sig Dispense Auth. Provider   sulfamethoxazole-trimethoprim (BACTRIM DS) 800-160 MG tablet Take 1 tablet by mouth 2 (two) times daily for 7 days. 14 tablet Volney American, [place]     PDMP not reviewed this encounter.   Merrie Roof [place], [place] 01/07/20 [id number]

## 2020-01-10 ENCOUNTER — Ambulatory Visit: Payer: 59 | Admitting: Emergency Medicine

## 2020-01-10 ENCOUNTER — Other Ambulatory Visit: Payer: Self-pay

## 2020-01-10 ENCOUNTER — Encounter: Payer: Self-pay | Admitting: Emergency Medicine

## 2020-01-10 VITALS — BP 165/79 | HR 113 | Temp 97.8°F | Ht 75.0 in | Wt 310.2 lb

## 2020-01-10 DIAGNOSIS — L97509 Non-pressure chronic ulcer of other part of unspecified foot with unspecified severity: Secondary | ICD-10-CM

## 2020-01-10 DIAGNOSIS — E11621 Type 2 diabetes mellitus with foot ulcer: Secondary | ICD-10-CM

## 2020-01-10 DIAGNOSIS — Z23 Encounter for immunization: Secondary | ICD-10-CM

## 2020-01-10 DIAGNOSIS — I152 Hypertension secondary to endocrine disorders: Secondary | ICD-10-CM

## 2020-01-10 DIAGNOSIS — E785 Hyperlipidemia, unspecified: Secondary | ICD-10-CM | POA: Diagnosis not present

## 2020-01-10 DIAGNOSIS — E1159 Type 2 diabetes mellitus with other circulatory complications: Secondary | ICD-10-CM

## 2020-01-10 DIAGNOSIS — Z6838 Body mass index (BMI) 38.0-38.9, adult: Secondary | ICD-10-CM

## 2020-01-10 DIAGNOSIS — I10 Essential (primary) hypertension: Secondary | ICD-10-CM

## 2020-01-10 LAB — POCT GLYCOSYLATED HEMOGLOBIN (HGB A1C): Hemoglobin A1C: 14 % — AB (ref 4.0–5.6)

## 2020-01-10 LAB — GLUCOSE, POCT (MANUAL RESULT ENTRY): POC Glucose: 250 mg/dl — AB (ref 70–99)

## 2020-01-10 MED ORDER — TRULICITY 1.5 MG/0.5ML ~~LOC~~ SOAJ: 1.5000 mg | SUBCUTANEOUS | 3 refills | Status: DC

## 2020-01-10 MED ORDER — LISINOPRIL 40 MG PO TABS: 40.0000 mg | ORAL_TABLET | Freq: Every day | ORAL | 3 refills | Status: DC

## 2020-01-10 MED ORDER — GLIPIZIDE 10 MG PO TABS: 10.0000 mg | ORAL_TABLET | Freq: Two times a day (BID) | ORAL | 3 refills | Status: DC

## 2020-01-10 MED ORDER — AMLODIPINE BESYLATE 10 MG PO TABS: 10.0000 mg | ORAL_TABLET | Freq: Every day | ORAL | 3 refills | Status: DC

## 2020-01-10 MED ORDER — ROSUVASTATIN CALCIUM 20 MG PO TABS: 20.0000 mg | ORAL_TABLET | Freq: Every day | ORAL | 3 refills | Status: DC

## 2020-01-10 NOTE — Patient Instructions (Addendum)
If you have lab work done today you will be contacted with your lab results within the next 2 weeks.  If you have not heard from [place] then please contact us. The fastest way to get your results is to register for My Chart.   IF you received an x-ray today, you will receive an invoice from [facility]. Please contact [facility] at [phone] with questions or concerns regarding your invoice.   IF you received labwork today, you will receive an invoice from [facility]. Please contact [facility] at [phone] with questions or concerns regarding your invoice.   Our billing staff will not be able to assist you with questions regarding bills from these companies.  You will be contacted with the lab results as soon as they are available. The fastest way to get your results is to activate your My Chart account. Instructions are located on the last page of this paperwork. If you have not heard from [place] regarding the results in 2 weeks, please contact this office.      Diabetes Mellitus and Nutrition, Adult When you have diabetes (diabetes mellitus), it is very important to have healthy eating habits because your blood sugar (glucose) levels are greatly affected by what you eat and drink. Eating healthy foods in the appropriate amounts, at about the same times every day, can help you:  Control your blood glucose.  Lower your risk of heart disease.  Improve your blood pressure.  Reach or maintain a healthy weight. Every person with diabetes is different, and each person has different needs for a meal plan. Your health care provider may recommend that you work with a diet and nutrition specialist (dietitian) to make a meal plan that is best for you. Your meal plan may vary depending on factors such as:  The calories you need.  The medicines you take.  Your weight.  Your blood glucose, blood pressure, and cholesterol levels.  Your activity level.  Other health  conditions you have, such as heart or kidney disease. How do carbohydrates affect me? Carbohydrates, also called carbs, affect your blood glucose level more than any other type of food. Eating carbs naturally raises the amount of glucose in your blood. Carb counting is a method for keeping track of how many carbs you eat. Counting carbs is important to keep your blood glucose at a healthy level, especially if you use insulin or take certain oral diabetes medicines. It is important to know how many carbs you can safely have in each meal. This is different for every person. Your dietitian can help you calculate how many carbs you should have at each meal and for each snack. Foods that contain carbs include:  Bread, cereal, rice, pasta, and crackers.  Potatoes and corn.  Peas, beans, and lentils.  Milk and yogurt.  Fruit and juice.  Desserts, such as cakes, cookies, ice cream, and candy. How does alcohol affect me? Alcohol can cause a sudden decrease in blood glucose (hypoglycemia), especially if you use insulin or take certain oral diabetes medicines. Hypoglycemia can be a life-threatening condition. Symptoms of hypoglycemia (sleepiness, dizziness, and confusion) are similar to symptoms of having too much alcohol. If your health care provider says that alcohol is safe for you, follow these guidelines:  Limit alcohol intake to no more than 1 drink per day for nonpregnant women and 2 drinks per day for men. One drink equals 12 oz of beer, 5 oz of wine, or 1 oz of hard  liquor.  Do not drink on an empty stomach.  Keep yourself hydrated with water, diet soda, or unsweetened iced tea.  Keep in mind that regular soda, juice, and other mixers may contain a lot of sugar and must be counted as carbs. What are tips for following this plan?  Reading food labels  Start by checking the serving size on the "Nutrition Facts" label of packaged foods and drinks. The amount of calories, carbs, fats, and  other nutrients listed on the label is based on one serving of the item. Many items contain more than one serving per package.  Check the total grams (g) of carbs in one serving. You can calculate the number of servings of carbs in one serving by dividing the total carbs by 15. For example, if a food has 30 g of total carbs, it would be equal to 2 servings of carbs.  Check the number of grams (g) of saturated and trans fats in one serving. Choose foods that have low or no amount of these fats.  Check the number of milligrams (mg) of salt (sodium) in one serving. Most people should limit total sodium intake to less than 2,300 mg per day.  Always check the nutrition information of foods labeled as "low-fat" or "nonfat". These foods may be higher in added sugar or refined carbs and should be avoided.  Talk to your dietitian to identify your daily goals for nutrients listed on the label. Shopping  Avoid buying canned, premade, or processed foods. These foods tend to be high in fat, sodium, and added sugar.  Shop around the outside edge of the grocery store. This includes fresh fruits and vegetables, bulk grains, fresh meats, and fresh dairy. Cooking  Use low-heat cooking methods, such as baking, instead of high-heat cooking methods like deep frying.  Cook using healthy oils, such as olive, canola, or sunflower oil.  Avoid cooking with butter, cream, or high-fat meats. Meal planning  Eat meals and snacks regularly, preferably at the same times every day. Avoid going long periods of time without eating.  Eat foods high in fiber, such as fresh fruits, vegetables, beans, and whole grains. Talk to your dietitian about how many servings of carbs you can eat at each meal.  Eat 4-6 ounces (oz) of lean protein each day, such as lean meat, chicken, fish, eggs, or tofu. One oz of lean protein is equal to: ? 1 oz of meat, chicken, or fish. ? 1 egg. ?  cup of tofu.  Eat some foods each day that  contain healthy fats, such as avocado, nuts, seeds, and fish. Lifestyle  Check your blood glucose regularly.  Exercise regularly as told by your health care provider. This may include: ? 150 minutes of moderate-intensity or vigorous-intensity exercise each week. This could be brisk walking, biking, or water aerobics. ? Stretching and doing strength exercises, such as yoga or weightlifting, at least 2 times a week.  Take medicines as told by your health care provider.  Do not use any products that contain nicotine or tobacco, such as cigarettes and e-cigarettes. If you need help quitting, ask your health care provider.  Work with a Social worker or diabetes educator to identify strategies to manage stress and any emotional and social challenges. Questions to ask a health care provider  Do I need to meet with a diabetes educator?  Do I need to meet with a dietitian?  What number can I call if I have questions?  When are the best  times to check my blood glucose? Where to find more information:  American Diabetes Association: diabetes.org  Academy of Nutrition and Dietetics: www.eatright.[employer] of Diabetes and Digestive and Kidney Diseases (NIH): [employer] Summary  A healthy meal plan will help you control your blood glucose and maintain a healthy lifestyle.  Working with a diet and nutrition specialist (dietitian) can help you make a meal plan that is best for you.  Keep in mind that carbohydrates (carbs) and alcohol have immediate effects on your blood glucose levels. It is important to count carbs and to use alcohol carefully. This information is not intended to replace advice given to you by your health care provider. Make sure you discuss any questions you have with your health care provider. Document Revised: 04/08/2017 Document Reviewed: 05/31/2016 Elsevier Patient Education  2020 [employer].

## 2020-01-10 NOTE — Assessment & Plan Note (Signed)
Elevated blood pressure.  Continue amlodipine 10 mg daily and increase lisinopril to 40 mg daily. Uncontrolled diabetes with elevated hemoglobin A1c at 14.0.  Has not been taking medications.  Restart glipizide 10 mg twice a day and Trulicity 1.5 mg weekly.  Diet and nutrition discussed with patient.  We will follow-up in 4 weeks. Continue statin therapy with rosuvastatin 20 mg daily.

## 2020-01-10 NOTE — Progress Notes (Signed)
[name] 38 y.o.   Chief Complaint  Patient presents with   Foot Ulcer    was seen by the Urgent care on 01/07/20. 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 They put me on bactrum medication   Diabetes    would like to talk about getting back on diabetes medication/insulin   Hypertension    Bp has still been running high and may need to increase bp meds    HISTORY OF PRESENT ILLNESS: This is a 39 y.o. male with history of hypertension, diabetes, dyslipidemia here for follow-up. Noncompliant with diet and medications. Recently seen at urgent care center for infected ulcer on left foot. Started on Bactrim DS. Doing better. 1. Diabetes: Intolerant to Metformin. Seen by me about 1 year ago. Supposed to be on Trulicity 1.5 mg weekly and glipizide 5 mg twice a day but noncompliant. Lab Results  Component Value Date   HGBA1C 11.7 (A) 01/30/2019   2. Hypertension: On amlodipine and lisinopril. Noncompliant with medication. BP Readings from Last 3 Encounters:  01/10/20 (!) 165/79  01/07/20 (!) 158/111  10/05/19 (!) 181/114   3. Dyslipidemia: On rosuvastatin 20 mg daily. Noncompliant. Vaccinated against Covid.  HPI   Prior to Admission medications   Medication Sig Start Date End Date Taking? Authorizing Provider  acetaminophen (TYLENOL) 500 MG tablet Take 1,000 mg by mouth every 6 (six) hours as needed for mild pain.    [provider]  amLODipine (NORVASC) 10 MG tablet Take 1 tablet (10 mg total) by mouth daily. 10/05/19   Raylene Everts, MD  aspirin EC 81 MG tablet Take 1 tablet (81 mg total) by mouth daily. 07/11/14   Shawnee Knapp, MD  blood glucose meter kit and supplies Dispense based on patient and insurance preference. Use up to four times daily as directed. (FOR ICD-10 E10.9, E11.9). 01/16/19   Eugenie Filler, MD  doxylamine, Sleep, (UNISOM) 25 MG tablet Take 25 mg by mouth at bedtime as needed.    [provider]   FLUoxetine (PROZAC) 10 MG tablet     [provider]  glipiZIDE (GLUCOTROL) 5 MG tablet Take 1 tablet (5 mg total) by mouth 2 (two) times daily before a meal. 01/30/19 04/30/19  Horald Pollen, MD  ibuprofen (ADVIL) 800 MG tablet Take 1 tablet (800 mg total) by mouth 3 (three) times daily. 10/05/19   Raylene Everts, MD  lisinopril (ZESTRIL) 20 MG tablet Take 1 tablet (20 mg total) by mouth daily. 10/05/19   Raylene Everts, MD  loratadine (CLARITIN) 10 MG tablet Take 10 mg by mouth as needed for allergies.    [provider]  nitroGLYCERIN (NITROSTAT) 0.4 MG SL tablet Place 1 tablet (0.4 mg total) under the tongue every 5 (five) minutes as needed for chest pain. 01/16/19   Eugenie Filler, MD  rosuvastatin (CRESTOR) 20 MG tablet Take 1 tablet (20 mg total) by mouth daily. 01/16/19   Eugenie Filler, MD  sulfamethoxazole-trimethoprim (BACTRIM DS) 800-160 MG tablet Take 1 tablet by mouth 2 (two) times daily for 7 days. 01/07/20 01/14/20  Volney American, PA-C  traZODone (DESYREL) 100 MG tablet TAKE 1 TABLET BY MOUTH EVERY DAY AT BEDTIME AS NEEDED FOR SLEEP 12/10/19   [provider]    Allergies  Allergen Reactions   Amoxicillin Anaphylaxis and Other (See Comments)    Has patient had a PCN reaction causing immediate rash, facial/tongue/throat swelling,  SOB or lightheadedness with hypotension: yes Has patient had a PCN reaction causing severe rash involving mucus membranes or skin necrosis: no Has patient had a PCN reaction that required hospitalization yes Has patient had a PCN reaction occurring within the last 10 years: yes If all of the above answers are "NO", then may proceed with Cephalosporin use.   Penicillins Anaphylaxis and Other (See Comments)    Patient was told to list this as an allergy because he is allergic to Amoxicillin Has patient had a PCN reaction causing immediate rash, facial/tongue/throat swelling, SOB or lightheadedness with  hypotension: yes for Amoxicillin Has patient had a PCN reaction causing severe rash involving mucus membranes or skin necrosis: no Has patient had a PCN reaction that required hospitalization yes for Amoxicillin Has patient had a PCN reaction occurring within the last 10 years: yes for Amoxicillin If all o   Ambien [Zolpidem] Other (See Comments)    Passed out. And memory loss   Metformin And Related Other (See Comments)    SEVERE GI UPSET   Other Other (See Comments)    Powder in some gloves - localized itching but not allergic to latex, benadryl usually helps with this reaction    Patient Active Problem List   Diagnosis Date Noted   Hypertension associated with diabetes ([place]) 04/17/2018   Dyslipidemia 04/17/2018   PTSD (post-traumatic stress disorder) 08/11/2016   Spinal stenosis, lumbar region with neurogenic claudication 05/05/2016   Dysthymia 09/02/2015   Chronic low back pain 09/02/2015   Dyslipidemia associated with type 2 diabetes mellitus ([place]) 03/11/2008   UNSPECIFIED ANEMIA 03/11/2008    Past Medical History:  Diagnosis Date   Allergies    Allergy    Anemia    Anxiety    Depression    Diabetes mellitus    GERD (gastroesophageal reflux disease)    Hyperlipidemia    Hypertension    Lumbar disc herniation    Neuromuscular disorder (HCC)    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    PTSD (post-traumatic stress disorder)    Scoliosis    Sleep apnea     Past Surgical History:  Procedure Laterality Date   LUMBAR LAMINECTOMY/DECOMPRESSION MICRODISCECTOMY Left 05/05/2016   Procedure: CENTRAL DECOMPRESSION L4-L5 AND FORAMINOTOMY FOR L4 ROOT AND L5 ROOT ON THE LEFT;  Surgeon: Latanya Maudlin, MD;  Location: [facility];  Service: Orthopedics;  Laterality: Left;   NO PAST SURGERIES      Social History   Socioeconomic History   Marital status: Divorced    Spouse name: Not on file   Number of children: Not on file   Years of education: Not on file    Highest education level: Not on file  Occupational History   Not on file  Tobacco Use   Smoking status: Former Smoker    Types: Cigars   Smokeless tobacco: Current User    Types: Snuff, Chew  Substance and Sexual Activity   Alcohol use: Yes    Alcohol/week: 5.0 standard drinks    Types: 5 Shots of liquor per week    Comment: few times year   Drug use: No   Sexual activity: Not on file  Other Topics Concern   Not on file  Social History Narrative   Not on file   Social Determinants of Health   Financial Resource Strain:    Difficulty of Paying Living Expenses: Not on file  Food Insecurity:    Worried About [place] in the Last Year: Not on  file   [place] in the Last Year: Not on file  Transportation Needs:    Lack of Transportation (Medical): Not on file   Lack of Transportation (Non-Medical): Not on file  Physical Activity:    Days of Exercise per Week: Not on file   Minutes of Exercise per Session: Not on file  Stress:    Feeling of Stress : Not on file  Social Connections:    Frequency of Communication with Friends and Family: Not on file   Frequency of Social Gatherings with Friends and Family: Not on file   Attends Religious Services: Not on file   Active Member of Clubs or Organizations: Not on file   Attends Archivist Meetings: Not on file   Marital Status: Not on file  Intimate Partner Violence:    Fear of Current or Ex-Partner: Not on file   Emotionally Abused: Not on file   Physically Abused: Not on file   Sexually Abused: Not on file    Family History  Problem Relation Age of Onset   Stroke Maternal Grandmother    Colon cancer Neg Hx    Esophageal cancer Neg Hx    Rectal cancer Neg Hx    Stomach cancer Neg Hx      Review of Systems  Constitutional: Negative.  Negative for chills and fever.  HENT: Negative.  Negative for congestion and sore throat.   Respiratory: Negative.  Negative  for cough and shortness of breath.   Cardiovascular: Negative.  Negative for chest pain and palpitations.  Gastrointestinal: Negative.  Negative for abdominal pain, blood in stool, diarrhea, melena, nausea and vomiting.  Genitourinary: Negative.  Negative for dysuria and hematuria.  Musculoskeletal: Negative.  Negative for back pain, myalgias and neck pain.  Skin: Negative.  Negative for rash.  Neurological: Negative.  Negative for dizziness and headaches.  All other systems reviewed and are negative.  Today's Vitals   01/10/20 1502  BP: (!) 165/79  Pulse: (!) 113  Temp: 97.8 F (36.6 C)  TempSrc: Temporal  SpO2: 99%  Weight: (!) 310 lb 3.2 oz (140.7 kg)  Height: 6' 3"  (1.905 m)   Body mass index is 38.77 kg/m.   Physical Exam Vitals reviewed.  Constitutional:      Appearance: Normal appearance. He is obese.  HENT:     Head: Normocephalic.  Eyes:     Extraocular Movements: Extraocular movements intact.     Pupils: Pupils are equal, round, and reactive to light.  Cardiovascular:     Rate and Rhythm: Normal rate and regular rhythm.     Pulses: Normal pulses.     Heart sounds: Normal heart sounds.  Pulmonary:     Effort: Pulmonary effort is normal.     Breath sounds: Normal breath sounds.  Musculoskeletal:        General: Normal range of motion.     Cervical back: Normal range of motion and neck supple.  Skin:    General: Skin is warm and dry.     Capillary Refill: Capillary refill takes less than 2 seconds.     Comments: Left foot: Superficial plantar ulcer.  Healing well.  No sign of active infection.  Neurological:     General: No focal deficit present.     Mental Status: He is alert and oriented to person, place, and time.  Psychiatric:        Mood and Affect: Mood normal.        Behavior: Behavior  normal.     Results for orders placed or performed in visit on 01/10/20 (from the past 24 hour(s))  POCT glucose (manual entry)     Status: Abnormal   Collection  Time: 01/10/20  3:24 PM  Result Value Ref Range   POC Glucose 250 (A) 70 - 99 mg/dl  POCT glycosylated hemoglobin (Hb A1C)     Status: Abnormal   Collection Time: 01/10/20  3:30 PM  Result Value Ref Range   Hemoglobin A1C 14.0 (A) 4.0 - 5.6 %   HbA1c POC (<> result, manual entry)     HbA1c, POC (prediabetic range)     HbA1c, POC (controlled diabetic range)    A total of 45 minutes was spent with the patient, greater than 50% of which was in counseling/coordination of care regarding diabetes, hypertension, dyslipidemia, and cardiovascular risks associated with these conditions, review of all medications and changes to doses, review of most recent blood work results including today's hemoglobin A1c, diet and nutrition, review of most recent office visit notes, review of recent urgent care center visit, prognosis and need for follow-up in 4 weeks.   ASSESSMENT & PLAN: Hypertension associated with diabetes ([place]) Elevated blood pressure.  Continue amlodipine 10 mg daily and increase lisinopril to 40 mg daily. Uncontrolled diabetes with elevated hemoglobin A1c at 14.0.  Has not been taking medications.  Restart glipizide 10 mg twice a day and Trulicity 1.5 mg weekly.  Diet and nutrition discussed with patient.  We will follow-up in 4 weeks. Continue statin therapy with rosuvastatin 20 mg daily.  Jamarrion was seen today for foot ulcer, diabetes and hypertension.  Diagnoses and all orders for this visit:  Hypertension associated with diabetes ([place]) -     Comprehensive metabolic panel -     lisinopril (ZESTRIL) 40 MG tablet; Take 1 tablet (40 mg total) by mouth daily. -     amLODipine (NORVASC) 10 MG tablet; Take 1 tablet (10 mg total) by mouth daily. -     glipiZIDE (GLUCOTROL) 10 MG tablet; Take 1 tablet (10 mg total) by mouth 2 (two) times daily before a meal. -     Dulaglutide (TRULICITY) 1.5 GH/8.2XH SOPN; Inject 0.5 mLs (1.5 mg total) into the skin once a week.  Hyperlipidemia, unspecified  hyperlipidemia type -     Lipid panel -     CBC with Differential/Platelet  Type 2 diabetes mellitus with foot ulcer, without long-term current use of insulin (HCC) -     POCT glucose (manual entry) -     POCT glycosylated hemoglobin (Hb A1C) -     HM Diabetes Foot Exam  Need for prophylactic vaccination and inoculation against influenza -     Flu Vaccine QUAD 6+ mos PF IM (Fluarix Quad PF)  Need for prophylactic vaccination against Streptococcus pneumoniae (pneumococcus) -     Pneumococcal polysaccharide vaccine 23-valent greater than or equal to 2yo subcutaneous/IM  Body mass index (BMI) of 38.0-38.9 in adult  Morbid obesity, unspecified obesity type (HCC)  Essential hypertension -     lisinopril (ZESTRIL) 40 MG tablet; Take 1 tablet (40 mg total) by mouth daily. -     amLODipine (NORVASC) 10 MG tablet; Take 1 tablet (10 mg total) by mouth daily.  Dyslipidemia -     rosuvastatin (CRESTOR) 20 MG tablet; Take 1 tablet (20 mg total) by mouth daily.    Patient Instructions       If you have lab work done today you will be contacted with  your lab results within the next 2 weeks.  If you have not heard from [place] then please contact us. The fastest way to get your results is to register for My Chart.   IF you received an x-ray today, you will receive an invoice from [facility]. Please contact [facility] at [phone] with questions or concerns regarding your invoice.   IF you received labwork today, you will receive an invoice from [place]. Please contact [facility] at [phone] with questions or concerns regarding your invoice.   Our billing staff will not be able to assist you with questions regarding bills from these companies.  You will be contacted with the lab results as soon as they are available. The fastest way to get your results is to activate your My Chart account. Instructions are located on the last page of this paperwork. If you have not  heard from [place] regarding the results in 2 weeks, please contact this office.      Diabetes Mellitus and Nutrition, Adult When you have diabetes (diabetes mellitus), it is very important to have healthy eating habits because your blood sugar (glucose) levels are greatly affected by what you eat and drink. Eating healthy foods in the appropriate amounts, at about the same times every day, can help you:  Control your blood glucose.  Lower your risk of heart disease.  Improve your blood pressure.  Reach or maintain a healthy weight. Every person with diabetes is different, and each person has different needs for a meal plan. Your health care provider may recommend that you work with a diet and nutrition specialist (dietitian) to make a meal plan that is best for you. Your meal plan may vary depending on factors such as:  The calories you need.  The medicines you take.  Your weight.  Your blood glucose, blood pressure, and cholesterol levels.  Your activity level.  Other health conditions you have, such as heart or kidney disease. How do carbohydrates affect me? Carbohydrates, also called carbs, affect your blood glucose level more than any other type of food. Eating carbs naturally raises the amount of glucose in your blood. Carb counting is a method for keeping track of how many carbs you eat. Counting carbs is important to keep your blood glucose at a healthy level, especially if you use insulin or take certain oral diabetes medicines. It is important to know how many carbs you can safely have in each meal. This is different for every person. Your dietitian can help you calculate how many carbs you should have at each meal and for each snack. Foods that contain carbs include:  Bread, cereal, rice, pasta, and crackers.  Potatoes and corn.  Peas, beans, and lentils.  Milk and yogurt.  Fruit and juice.  Desserts, such as cakes, cookies, ice cream, and candy. How does alcohol  affect me? Alcohol can cause a sudden decrease in blood glucose (hypoglycemia), especially if you use insulin or take certain oral diabetes medicines. Hypoglycemia can be a life-threatening condition. Symptoms of hypoglycemia (sleepiness, dizziness, and confusion) are similar to symptoms of having too much alcohol. If your health care provider says that alcohol is safe for you, follow these guidelines:  Limit alcohol intake to no more than 1 drink per day for nonpregnant women and 2 drinks per day for men. One drink equals 12 oz of beer, 5 oz of wine, or 1 oz of hard liquor.  Do not drink on an empty stomach.  Keep yourself hydrated with  water, diet soda, or unsweetened iced tea.  Keep in mind that regular soda, juice, and other mixers may contain a lot of sugar and must be counted as carbs. What are tips for following this plan?  Reading food labels  Start by checking the serving size on the "Nutrition Facts" label of packaged foods and drinks. The amount of calories, carbs, fats, and other nutrients listed on the label is based on one serving of the item. Many items contain more than one serving per package.  Check the total grams (g) of carbs in one serving. You can calculate the number of servings of carbs in one serving by dividing the total carbs by 15. For example, if a food has 30 g of total carbs, it would be equal to 2 servings of carbs.  Check the number of grams (g) of saturated and trans fats in one serving. Choose foods that have low or no amount of these fats.  Check the number of milligrams (mg) of salt (sodium) in one serving. Most people should limit total sodium intake to less than 2,300 mg per day.  Always check the nutrition information of foods labeled as "low-fat" or "nonfat". These foods may be higher in added sugar or refined carbs and should be avoided.  Talk to your dietitian to identify your daily goals for nutrients listed on the label. Shopping  Avoid buying  canned, premade, or processed foods. These foods tend to be high in fat, sodium, and added sugar.  Shop around the outside edge of the grocery store. This includes fresh fruits and vegetables, bulk grains, fresh meats, and fresh dairy. Cooking  Use low-heat cooking methods, such as baking, instead of high-heat cooking methods like deep frying.  Cook using healthy oils, such as olive, canola, or sunflower oil.  Avoid cooking with butter, cream, or high-fat meats. Meal planning  Eat meals and snacks regularly, preferably at the same times every day. Avoid going long periods of time without eating.  Eat foods high in fiber, such as fresh fruits, vegetables, beans, and whole grains. Talk to your dietitian about how many servings of carbs you can eat at each meal.  Eat 4-6 ounces (oz) of lean protein each day, such as lean meat, chicken, fish, eggs, or tofu. One oz of lean protein is equal to: ? 1 oz of meat, chicken, or fish. ? 1 egg. ?  cup of tofu.  Eat some foods each day that contain healthy fats, such as avocado, nuts, seeds, and fish. Lifestyle  Check your blood glucose regularly.  Exercise regularly as told by your health care provider. This may include: ? 150 minutes of moderate-intensity or vigorous-intensity exercise each week. This could be brisk walking, biking, or water aerobics. ? Stretching and doing strength exercises, such as yoga or weightlifting, at least 2 times a week.  Take medicines as told by your health care provider.  Do not use any products that contain nicotine or tobacco, such as cigarettes and e-cigarettes. If you need help quitting, ask your health care provider.  Work with a Social worker or diabetes educator to identify strategies to manage stress and any emotional and social challenges. Questions to ask a health care provider  Do I need to meet with a diabetes educator?  Do I need to meet with a dietitian?  What number can I call if I have  questions?  When are the best times to check my blood glucose? Where to find more information:  American Diabetes  Association: diabetes.org  Academy of Nutrition and Dietetics: www.eatright.[employer] of Diabetes and Digestive and Kidney Diseases (NIH): [employer] Summary  A healthy meal plan will help you control your blood glucose and maintain a healthy lifestyle.  Working with a diet and nutrition specialist (dietitian) can help you make a meal plan that is best for you.  Keep in mind that carbohydrates (carbs) and alcohol have immediate effects on your blood glucose levels. It is important to count carbs and to use alcohol carefully. This information is not intended to replace advice given to you by your health care provider. Make sure you discuss any questions you have with your health care provider. Document Revised: 04/08/2017 Document Reviewed: 05/31/2016 Elsevier Patient Education  2020 Elsevier Inc.      Agustina Caroli, MD [facility]

## 2020-01-11 LAB — CBC WITH DIFFERENTIAL/PLATELET
Basophils Absolute: 0 10*3/uL (ref 0.0–0.2)
Basos: 0 %
EOS (ABSOLUTE): 0.1 10*3/uL (ref 0.0–0.4)
Eos: 2 %
Hematocrit: 41.1 % (ref 37.5–51.0)
Hemoglobin: 13.5 g/dL (ref 13.0–17.7)
Immature Grans (Abs): 0 10*3/uL (ref 0.0–0.1)
Immature Granulocytes: 0 %
Lymphocytes Absolute: 2.6 10*3/uL (ref 0.7–3.1)
Lymphs: 29 %
MCH: 27.4 pg (ref 26.6–33.0)
MCHC: 32.8 g/dL (ref 31.5–35.7)
MCV: 83 fL (ref 79–97)
Monocytes Absolute: 0.5 10*3/uL (ref 0.1–0.9)
Monocytes: 6 %
Neutrophils Absolute: 5.6 10*3/uL (ref 1.4–7.0)
Neutrophils: 63 %
Platelets: 291 10*3/uL (ref 150–450)
RBC: 4.93 x10E6/uL (ref 4.14–5.80)
RDW: 13.8 % (ref 11.6–15.4)
WBC: 8.9 10*3/uL (ref 3.4–10.8)

## 2020-01-11 LAB — LIPID PANEL
Chol/HDL Ratio: 8 ratio — ABNORMAL HIGH (ref 0.0–5.0)
Cholesterol, Total: 256 mg/dL — ABNORMAL HIGH (ref 100–199)
HDL: 32 mg/dL — ABNORMAL LOW (ref >39)
LDL Chol Calc (NIH): 99 mg/dL (ref 0–99)
Triglycerides: 733 mg/dL (ref 0–149)
VLDL Cholesterol Cal: 125 mg/dL — ABNORMAL HIGH (ref 5–40)

## 2020-01-11 LAB — COMPREHENSIVE METABOLIC PANEL
ALT: 21 IU/L (ref 0–44)
AST: 11 IU/L (ref 0–40)
Albumin/Globulin Ratio: 1.4 (ref 1.2–2.2)
Albumin: 4.2 g/dL (ref 4.0–5.0)
Alkaline Phosphatase: 84 IU/L (ref 48–121)
BUN/Creatinine Ratio: 18 (ref 9–20)
BUN: 14 mg/dL (ref 6–20)
Bilirubin Total: <0.2 mg/dL (ref 0.0–1.2)
CO2: 21 mmol/L (ref 20–29)
Calcium: 9.9 mg/dL (ref 8.7–10.2)
Chloride: 100 mmol/L (ref 96–106)
Creatinine, Ser: 0.77 mg/dL (ref 0.76–1.27)
GFR calc Af Amer: 134 mL/min/{1.73_m2} (ref >59)
GFR calc non Af Amer: 116 mL/min/{1.73_m2} (ref >59)
Globulin, Total: 3 g/dL (ref 1.5–4.5)
Glucose: 260 mg/dL — ABNORMAL HIGH (ref 65–99)
Potassium: 4.2 mmol/L (ref 3.5–5.2)
Sodium: 137 mmol/L (ref 134–144)
Total Protein: 7.2 g/dL (ref 6.0–8.5)

## 2020-02-07 ENCOUNTER — Other Ambulatory Visit: Payer: Self-pay

## 2020-02-07 ENCOUNTER — Ambulatory Visit: Payer: 59 | Admitting: Emergency Medicine

## 2020-02-07 ENCOUNTER — Encounter: Payer: Self-pay | Admitting: Emergency Medicine

## 2020-02-07 ENCOUNTER — Telehealth: Payer: Self-pay | Admitting: *Deleted

## 2020-02-07 VITALS — BP 166/106 | HR 94 | Temp 97.6°F | Ht 75.0 in | Wt 311.8 lb

## 2020-02-07 DIAGNOSIS — E785 Hyperlipidemia, unspecified: Secondary | ICD-10-CM | POA: Diagnosis not present

## 2020-02-07 DIAGNOSIS — E1159 Type 2 diabetes mellitus with other circulatory complications: Secondary | ICD-10-CM

## 2020-02-07 DIAGNOSIS — E11621 Type 2 diabetes mellitus with foot ulcer: Secondary | ICD-10-CM | POA: Diagnosis not present

## 2020-02-07 DIAGNOSIS — E1169 Type 2 diabetes mellitus with other specified complication: Secondary | ICD-10-CM

## 2020-02-07 DIAGNOSIS — E669 Obesity, unspecified: Secondary | ICD-10-CM

## 2020-02-07 DIAGNOSIS — Z6838 Body mass index (BMI) 38.0-38.9, adult: Secondary | ICD-10-CM

## 2020-02-07 DIAGNOSIS — I10 Essential (primary) hypertension: Secondary | ICD-10-CM | POA: Diagnosis not present

## 2020-02-07 DIAGNOSIS — I152 Hypertension secondary to endocrine disorders: Secondary | ICD-10-CM

## 2020-02-07 DIAGNOSIS — L97429 Non-pressure chronic ulcer of left heel and midfoot with unspecified severity: Secondary | ICD-10-CM

## 2020-02-07 MED ORDER — LOSARTAN POTASSIUM-HCTZ 100-12.5 MG PO TABS: 1.0000 | ORAL_TABLET | Freq: Every day | ORAL | 3 refills | Status: DC

## 2020-02-07 MED ORDER — ROSUVASTATIN CALCIUM 20 MG PO TABS: 20.0000 mg | ORAL_TABLET | Freq: Every day | ORAL | 3 refills | Status: DC

## 2020-02-07 MED ORDER — GLIPIZIDE 10 MG PO TABS: 10.0000 mg | ORAL_TABLET | Freq: Two times a day (BID) | ORAL | 3 refills | Status: DC

## 2020-02-07 MED ORDER — TRULICITY 1.5 MG/0.5ML ~~LOC~~ SOAJ: 1.5000 mg | SUBCUTANEOUS | 3 refills | Status: DC

## 2020-02-07 MED ORDER — AMLODIPINE BESYLATE 10 MG PO TABS: 10.0000 mg | ORAL_TABLET | Freq: Every day | ORAL | 3 refills | Status: DC

## 2020-02-07 NOTE — Assessment & Plan Note (Signed)
Noncompliant with medications. Uncontrolled hypertension.  Continue amlodipine 10 mg daily.  Stop lisinopril and start losartan-HCTZ 100-12.5 mg daily. Uncontrolled diabetes.  Most are glipizide 10 mg twice a day and Trulicity 1.5 mg weekly. Diet and nutrition discussed. Follow-up in 3 months.

## 2020-02-07 NOTE — Assessment & Plan Note (Addendum)
Needs follow-up with podiatry or orthopedist.  Referral placed today. Diabetes control of paramount importance.

## 2020-02-07 NOTE — Progress Notes (Signed)
[name] 39 y.o.   Chief Complaint  Patient presents with   Follow-up    x4 wks for Hypertension/diabetes  ASSESSMENT & PLAN: Hypertension associated with diabetes ([place]) Elevated blood pressure.  Continue amlodipine 10 mg daily and increase lisinopril to 40 mg daily. Uncontrolled diabetes with elevated hemoglobin A1c at 14.0.  Has not been taking medications.  Restart glipizide 10 mg twice a day and Trulicity 1.5 mg weekly.  Diet and nutrition discussed with patient.  We will follow-up in 4 weeks. Continue statin therapy with rosuvastatin 20 mg daily.  HISTORY OF PRESENT ILLNESS: This is a 40 y.o. male here for 4-week follow-up of diabetes and hypertension.  However has not taken any of the medications prescribed for him.  States pharmacy told him they never got prescriptions sent from this office. Also complaining of left foot ulcer not healing very well or fast enough. No other complaints or medical concerns today.  Asymptomatic. Lab Results  Component Value Date   HGBA1C 14.0 (A) 01/10/2020   BP Readings from Last 3 Encounters:  02/07/20 (!) 166/106  01/10/20 (!) 165/79  01/07/20 (!) 158/111    HPI   Prior to Admission medications   Medication Sig Start Date End Date Taking? Authorizing Provider  acetaminophen (TYLENOL) 500 MG tablet Take 1,000 mg by mouth every 6 (six) hours as needed for mild pain.   Yes [provider]  amLODipine (NORVASC) 10 MG tablet Take 1 tablet (10 mg total) by mouth daily. 01/10/20 04/09/20 Yes SagardiaInes Bloomer, MD  aspirin EC 81 MG tablet Take 1 tablet (81 mg total) by mouth daily. 07/11/14  Yes Shawnee Knapp, MD  blood glucose meter kit and supplies Dispense based on patient and insurance preference. Use up to four times daily as directed. (FOR ICD-10 E10.9, E11.9). 01/16/19  Yes Eugenie Filler, MD  doxylamine, Sleep, (UNISOM) 25 MG tablet Take 25 mg by mouth at bedtime as needed.   Yes [provider]  Dulaglutide  (TRULICITY) 1.5 JJ/0.0XF SOPN Inject 0.5 mLs (1.5 mg total) into the skin once a week. 01/10/20  Yes Konica Stankowski, Ines Bloomer, MD  FLUoxetine (PROZAC) 10 MG tablet    Yes [provider]  glipiZIDE (GLUCOTROL) 10 MG tablet Take 1 tablet (10 mg total) by mouth 2 (two) times daily before a meal. 01/10/20 04/09/20 Yes Gotham Raden, Ines Bloomer, MD  ibuprofen (ADVIL) 800 MG tablet Take 1 tablet (800 mg total) by mouth 3 (three) times daily. 10/05/19  Yes Raylene Everts, MD  lisinopril (ZESTRIL) 40 MG tablet Take 1 tablet (40 mg total) by mouth daily. 01/10/20  Yes Rebeca Valdivia, Ines Bloomer, MD  loratadine (CLARITIN) 10 MG tablet Take 10 mg by mouth as needed for allergies.   Yes [provider]  nitroGLYCERIN (NITROSTAT) 0.4 MG SL tablet Place 1 tablet (0.4 mg total) under the tongue every 5 (five) minutes as needed for chest pain. 01/16/19  Yes Eugenie Filler, MD  rosuvastatin (CRESTOR) 20 MG tablet Take 1 tablet (20 mg total) by mouth daily. 01/10/20 04/09/20 Yes Katelyne Galster, Ines Bloomer, MD  traZODone (DESYREL) 100 MG tablet TAKE 1 TABLET BY MOUTH EVERY DAY AT BEDTIME AS NEEDED FOR SLEEP 12/10/19  Yes [provider]    Allergies  Allergen Reactions   Amoxicillin Anaphylaxis and Other (See Comments)    Has patient had a PCN reaction causing immediate rash, facial/tongue/throat swelling, SOB or lightheadedness with hypotension: yes Has patient had a PCN reaction causing severe rash involving  mucus membranes or skin necrosis: no Has patient had a PCN reaction that required hospitalization yes Has patient had a PCN reaction occurring within the last 10 years: yes If all of the above answers are "NO", then may proceed with Cephalosporin use.   Penicillins Anaphylaxis and Other (See Comments)    Patient was told to list this as an allergy because he is allergic to Amoxicillin Has patient had a PCN reaction causing immediate rash, facial/tongue/throat swelling, SOB or lightheadedness with  hypotension: yes for Amoxicillin Has patient had a PCN reaction causing severe rash involving mucus membranes or skin necrosis: no Has patient had a PCN reaction that required hospitalization yes for Amoxicillin Has patient had a PCN reaction occurring within the last 10 years: yes for Amoxicillin If all o   Ambien [Zolpidem] Other (See Comments)    Passed out. And memory loss   Metformin And Related Other (See Comments)    SEVERE GI UPSET   Other Other (See Comments)    Powder in some gloves - localized itching but not allergic to latex, benadryl usually helps with this reaction    Patient Active Problem List   Diagnosis Date Noted   Morbid obesity, unspecified obesity type ([place]) 01/10/2020   Hypertension associated with diabetes ([place]) 04/17/2018   Dyslipidemia 04/17/2018   PTSD (post-traumatic stress disorder) 08/11/2016   Spinal stenosis, lumbar region with neurogenic claudication 05/05/2016   Dysthymia 09/02/2015   Chronic low back pain 09/02/2015   Dyslipidemia associated with type 2 diabetes mellitus ([place]) 03/11/2008   UNSPECIFIED ANEMIA 03/11/2008    Past Medical History:  Diagnosis Date   Allergies    Allergy    Anemia    Anxiety    Depression    Diabetes mellitus    GERD (gastroesophageal reflux disease)    Hyperlipidemia    Hypertension    Lumbar disc herniation    Neuromuscular disorder (HCC)    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    PTSD (post-traumatic stress disorder)    Scoliosis    Sleep apnea     Past Surgical History:  Procedure Laterality Date   LUMBAR LAMINECTOMY/DECOMPRESSION MICRODISCECTOMY Left 05/05/2016   Procedure: CENTRAL DECOMPRESSION L4-L5 AND FORAMINOTOMY FOR L4 ROOT AND L5 ROOT ON THE LEFT;  Surgeon: Latanya Maudlin, MD;  Location: [facility];  Service: Orthopedics;  Laterality: Left;   NO PAST SURGERIES      Social History   Socioeconomic History   Marital status: Divorced    Spouse name: Not on file   Number  of children: Not on file   Years of education: Not on file   Highest education level: Not on file  Occupational History   Not on file  Tobacco Use   Smoking status: Former Smoker    Types: Cigars   Smokeless tobacco: Current User    Types: Snuff, Chew  Substance and Sexual Activity   Alcohol use: Yes    Alcohol/week: 5.0 standard drinks    Types: 5 Shots of liquor per week    Comment: few times year   Drug use: No   Sexual activity: Not on file  Other Topics Concern   Not on file  Social History Narrative   Not on file   Social Determinants of Health   Financial Resource Strain:    Difficulty of Paying Living Expenses: Not on file  Food Insecurity:    Worried About [place] in the Last Year: Not on file   [employer] of Food  in the Last Year: Not on file  Transportation Needs:    Lack of Transportation (Medical): Not on file   Lack of Transportation (Non-Medical): Not on file  Physical Activity:    Days of Exercise per Week: Not on file   Minutes of Exercise per Session: Not on file  Stress:    Feeling of Stress : Not on file  Social Connections:    Frequency of Communication with Friends and Family: Not on file   Frequency of Social Gatherings with Friends and Family: Not on file   Attends Religious Services: Not on file   Active Member of Clubs or Organizations: Not on file   Attends Archivist Meetings: Not on file   Marital Status: Not on file  Intimate Partner Violence:    Fear of Current or Ex-Partner: Not on file   Emotionally Abused: Not on file   Physically Abused: Not on file   Sexually Abused: Not on file    Family History  Problem Relation Age of Onset   Stroke Maternal Grandmother    Colon cancer Neg Hx    Esophageal cancer Neg Hx    Rectal cancer Neg Hx    Stomach cancer Neg Hx      Review of Systems  Constitutional: Negative.  Negative for chills and fever.  HENT: Negative.  Negative for  congestion and sore throat.   Respiratory: Negative.  Negative for cough and shortness of breath.   Cardiovascular: Negative.  Negative for chest pain and palpitations.  Gastrointestinal: Negative for abdominal pain, diarrhea, nausea and vomiting.  Genitourinary: Negative.  Negative for dysuria and hematuria.  Skin: Negative.  Negative for rash.  Neurological: Negative for dizziness and headaches.  All other systems reviewed and are negative.   Today's Vitals   02/07/20 0827 02/07/20 0828  BP: (!) 166/109 (!) 166/106  Pulse: 94   Temp: 97.6 F (36.4 C)   TempSrc: Temporal   SpO2: 95%   Weight: (!) 311 lb 12.8 oz (141.4 kg)   Height: _0  (1.905 m)    Body mass index is 38.97 kg/m. Wt Readings from Last 3 Encounters:  02/07/20 (!) 311 lb 12.8 oz (141.4 kg)  01/10/20 (!) 310 lb 3.2 oz (140.7 kg)  03/07/19 (!) 318 lb (144.2 kg)    Physical Exam Vitals reviewed.  Constitutional:      Appearance: Normal appearance. He is obese.  HENT:     Head: Normocephalic.  Eyes:     Extraocular Movements: Extraocular movements intact.     Pupils: Pupils are equal, round, and reactive to light.  Cardiovascular:     Rate and Rhythm: Normal rate and regular rhythm.     Pulses: Normal pulses.     Heart sounds: Normal heart sounds.  Pulmonary:     Effort: Pulmonary effort is normal.     Breath sounds: Normal breath sounds.  Abdominal:     Palpations: Abdomen is soft.     Tenderness: There is no abdominal tenderness.  Musculoskeletal:     Cervical back: Normal range of motion.  Skin:    General: Skin is warm and dry.     Capillary Refill: Capillary refill takes less than 2 seconds.     Comments: Left foot with diabetic ulcer.  See picture below.  Neurological:     General: No focal deficit present.     Mental Status: He is alert and oriented to person, place, and time.  Psychiatric:  Mood and Affect: Mood normal.        Behavior: Behavior normal.      A total of 45  minutes was spent with the patient, greater than 50% of which was in counseling/coordination of care regarding uncontrolled diabetes and hypertension and cardiovascular risks associated with these conditions, review of all medications and no changes, new medication losartan-HCTZ, diet and nutrition, evaluation of diabetic foot ulcer and need for follow-up with podiatry, review of most recent blood work results, review of most recent office visit notes, prognosis and need for follow-up.  ASSESSMENT & PLAN: Hypertension associated with diabetes ([place]) Noncompliant with medications. Uncontrolled hypertension.  Continue amlodipine 10 mg daily.  Stop lisinopril and start losartan-HCTZ 100-12.5 mg daily. Uncontrolled diabetes.  Most are glipizide 10 mg twice a day and Trulicity 1.5 mg weekly. Diet and nutrition discussed. Follow-up in 3 months.  Diabetic ulcer of left midfoot associated with type 2 diabetes mellitus ([place]) Needs follow-up with podiatry or orthopedist.  Referral placed today. Diabetes control of paramount importance.  Trygve was seen today for follow-up.  Diagnoses and all orders for this visit:  Uncontrolled hypertension  Hypertension associated with diabetes ([place]) -     Dulaglutide (TRULICITY) 1.5 TA/5.6PV SOPN; Inject 1.5 mg into the skin once a week. -     glipiZIDE (GLUCOTROL) 10 MG tablet; Take 1 tablet (10 mg total) by mouth 2 (two) times daily before a meal. -     amLODipine (NORVASC) 10 MG tablet; Take 1 tablet (10 mg total) by mouth daily.  Dyslipidemia -     rosuvastatin (CRESTOR) 20 MG tablet; Take 1 tablet (20 mg total) by mouth daily.  Essential hypertension -     amLODipine (NORVASC) 10 MG tablet; Take 1 tablet (10 mg total) by mouth daily. -     losartan-hydrochlorothiazide (HYZAAR) 100-12.5 MG tablet; Take 1 tablet by mouth daily.  Diabetic ulcer of left midfoot associated with type 2 diabetes mellitus, unspecified ulcer stage ([place]) -     Ambulatory referral  to Podiatry  Body mass index (BMI) of 38.0-38.9 in adult  Obesity, diabetes, and hypertension syndrome ([place])    Patient Instructions       If you have lab work done today you will be contacted with your lab results within the next 2 weeks.  If you have not heard from [place] then please contact us. The fastest way to get your results is to register for My Chart.   IF you received an x-ray today, you will receive an invoice from [facility]. Please contact [facility] at [phone] with questions or concerns regarding your invoice.   IF you received labwork today, you will receive an invoice from [employer]. Please contact [facility] at [phone] with questions or concerns regarding your invoice.   Our billing staff will not be able to assist you with questions regarding bills from these companies.  You will be contacted with the lab results as soon as they are available. The fastest way to get your results is to activate your My Chart account. Instructions are located on the last page of this paperwork. If you have not heard from [place] regarding the results in 2 weeks, please contact this office.     Diabetes Mellitus and Foot Care Foot care is an important part of your health, especially when you have diabetes. Diabetes may cause you to have problems because of poor blood flow (circulation) to your feet and legs, which can cause your skin to:  Become  thinner and drier.  Break more easily.  Heal more slowly.  Peel and crack. You may also have nerve damage (neuropathy) in your legs and feet, causing decreased feeling in them. This means that you may not notice minor injuries to your feet that could lead to more serious problems. Noticing and addressing any potential problems early is the best way to prevent future foot problems. How to care for your feet Foot hygiene  Wash your feet daily with warm water and mild soap. Do not use hot water. Then, pat your feet and  the areas between your toes until they are completely dry. Do not soak your feet as this can dry your skin.  Trim your toenails straight across. Do not dig under them or around the cuticle. File the edges of your nails with an emery board or nail file.  Apply a moisturizing lotion or petroleum jelly to the skin on your feet and to dry, brittle toenails. Use lotion that does not contain alcohol and is unscented. Do not apply lotion between your toes. Shoes and socks  Wear clean socks or stockings every day. Make sure they are not too tight. Do not wear knee-high stockings since they may decrease blood flow to your legs.  Wear shoes that fit properly and have enough cushioning. Always look in your shoes before you put them on to be sure there are no objects inside.  To break in new shoes, wear them for just a few hours a day. This prevents injuries on your feet. Wounds, scrapes, corns, and calluses  Check your feet daily for blisters, cuts, bruises, sores, and redness. If you cannot see the bottom of your feet, use a mirror or ask someone for help.  Do not cut corns or calluses or try to remove them with medicine.  If you find a minor scrape, cut, or break in the skin on your feet, keep it and the skin around it clean and dry. You may clean these areas with mild soap and water. Do not clean the area with peroxide, alcohol, or iodine.  If you have a wound, scrape, corn, or callus on your foot, look at it several times a day to make sure it is healing and not infected. Check for: ? Redness, swelling, or pain. ? Fluid or blood. ? Warmth. ? Pus or a bad smell. General instructions  Do not cross your legs. This may decrease blood flow to your feet.  Do not use heating pads or hot water bottles on your feet. They may burn your skin. If you have lost feeling in your feet or legs, you may not know this is happening until it is too late.  Protect your feet from hot and cold by wearing shoes, such  as at the beach or on hot pavement.  Schedule a complete foot exam at least once a year (annually) or more often if you have foot problems. If you have foot problems, report any cuts, sores, or bruises to your health care provider immediately. Contact a health care provider if:  You have a medical condition that increases your risk of infection and you have any cuts, sores, or bruises on your feet.  You have an injury that is not healing.  You have redness on your legs or feet.  You feel burning or tingling in your legs or feet.  You have pain or cramps in your legs and feet.  Your legs or feet are numb.  Your feet always feel  cold.  You have pain around a toenail. Get help right away if:  You have a wound, scrape, corn, or callus on your foot and: ? You have pain, swelling, or redness that gets worse. ? You have fluid or blood coming from the wound, scrape, corn, or callus. ? Your wound, scrape, corn, or callus feels warm to the touch. ? You have pus or a bad smell coming from the wound, scrape, corn, or callus. ? You have a fever. ? You have a red line going up your leg. Summary  Check your feet every day for cuts, sores, red spots, swelling, and blisters.  Moisturize feet and legs daily.  Wear shoes that fit properly and have enough cushioning.  If you have foot problems, report any cuts, sores, or bruises to your health care provider immediately.  Schedule a complete foot exam at least once a year (annually) or more often if you have foot problems. This information is not intended to replace advice given to you by your health care provider. Make sure you discuss any questions you have with your health care provider. Document Revised: 01/17/2019 Document Reviewed: 05/28/2016 Elsevier Patient Education  [place] DASH stands for "Dietary Approaches to Stop Hypertension." The DASH eating plan is a healthy eating plan that has been shown to  reduce high blood pressure (hypertension). It may also reduce your risk for type 2 diabetes, heart disease, and stroke. The DASH eating plan may also help with weight loss. What are tips for following this plan?  General guidelines  Avoid eating more than 2,300 mg (milligrams) of salt (sodium) a day. If you have hypertension, you may need to reduce your sodium intake to 1,500 mg a day.  Limit alcohol intake to no more than 1 drink a day for nonpregnant women and 2 drinks a day for men. One drink equals 12 oz of beer, 5 oz of wine, or 1 oz of hard liquor.  Work with your health care provider to maintain a healthy body weight or to lose weight. Ask what an ideal weight is for you.  Get at least 30 minutes of exercise that causes your heart to beat faster (aerobic exercise) most days of the week. Activities may include walking, swimming, or biking.  Work with your health care provider or diet and nutrition specialist (dietitian) to adjust your eating plan to your individual calorie needs. Reading food labels   Check food labels for the amount of sodium per serving. Choose foods with less than 5 percent of the Daily Value of sodium. Generally, foods with less than 300 mg of sodium per serving fit into this eating plan.  To find whole grains, look for the word "whole" as the first word in the ingredient list. Shopping  Buy products labeled as "low-sodium" or "no salt added."  Buy fresh foods. Avoid canned foods and premade or frozen meals. Cooking  Avoid adding salt when cooking. Use salt-free seasonings or herbs instead of table salt or sea salt. Check with your health care provider or pharmacist before using salt substitutes.  Do not fry foods. Cook foods using healthy methods such as baking, boiling, grilling, and broiling instead.  Cook with heart-healthy oils, such as olive, canola, soybean, or sunflower oil. Meal planning  Eat a balanced diet that includes: ? 5 or more servings  of fruits and vegetables each day. At each meal, try to fill half of your plate with fruits and vegetables. ? Up  to 6-8 servings of whole grains each day. ? Less than 6 oz of lean meat, poultry, or fish each day. A 3-oz serving of meat is about the same size as a deck of cards. One egg equals 1 oz. ? 2 servings of low-fat dairy each day. ? A serving of nuts, seeds, or beans 5 times each week. ? Heart-healthy fats. Healthy fats called Omega-3 fatty acids are found in foods such as flaxseeds and coldwater fish, like sardines, salmon, and mackerel.  Limit how much you eat of the following: ? Canned or prepackaged foods. ? Food that is high in trans fat, such as fried foods. ? Food that is high in saturated fat, such as fatty meat. ? Sweets, desserts, sugary drinks, and other foods with added sugar. ? Full-fat dairy products.  Do not salt foods before eating.  Try to eat at least 2 vegetarian meals each week.  Eat more home-cooked food and less restaurant, buffet, and fast food.  When eating at a restaurant, ask that your food be prepared with less salt or no salt, if possible. What foods are recommended? The items listed may not be a complete list. Talk with your dietitian about what dietary choices are best for you. Grains Whole-grain or whole-wheat bread. Whole-grain or whole-wheat pasta. Brown rice. [place]. Bulgur. Whole-grain and low-sodium cereals. Pita bread. Low-fat, low-sodium crackers. Whole-wheat flour tortillas. Vegetables Fresh or frozen vegetables (raw, steamed, roasted, or grilled). Low-sodium or reduced-sodium tomato and vegetable juice. Low-sodium or reduced-sodium tomato sauce and tomato paste. Low-sodium or reduced-sodium canned vegetables. Fruits All fresh, dried, or frozen fruit. Canned fruit in natural juice (without added sugar). Meat and other protein foods Skinless chicken or [place]. Ground chicken or [place]. Pork with fat trimmed off. Fish and seafood. Egg  whites. Dried beans, peas, or lentils. Unsalted nuts, nut butters, and seeds. Unsalted canned beans. Lean cuts of beef with fat trimmed off. Low-sodium, lean deli meat. Dairy Low-fat (1%) or fat-free (skim) milk. Fat-free, low-fat, or reduced-fat cheeses. Nonfat, low-sodium ricotta or cottage cheese. Low-fat or nonfat yogurt. Low-fat, low-sodium cheese. Fats and oils Soft margarine without trans fats. Vegetable oil. Low-fat, reduced-fat, or light mayonnaise and salad dressings (reduced-sodium). Canola, safflower, olive, soybean, and sunflower oils. Avocado. Seasoning and other foods Herbs. Spices. Seasoning mixes without salt. Unsalted popcorn and pretzels. Fat-free sweets. What foods are not recommended? The items listed may not be a complete list. Talk with your dietitian about what dietary choices are best for you. Grains Baked goods made with fat, such as croissants, muffins, or some breads. Dry pasta or rice meal packs. Vegetables Creamed or fried vegetables. Vegetables in a cheese sauce. Regular canned vegetables (not low-sodium or reduced-sodium). Regular canned tomato sauce and paste (not low-sodium or reduced-sodium). Regular tomato and vegetable juice (not low-sodium or reduced-sodium). Angie Fava. Olives. Fruits Canned fruit in a light or heavy syrup. Fried fruit. Fruit in cream or butter sauce. Meat and other protein foods Fatty cuts of meat. Ribs. Fried meat. Berniece Salines. Sausage. Bologna and other processed lunch meats. Salami. Fatback. Hotdogs. Bratwurst. Salted nuts and seeds. Canned beans with added salt. Canned or smoked fish. Whole eggs or egg yolks. Chicken or [place] with skin. Dairy Whole or 2% milk, cream, and half-and-half. Whole or full-fat cream cheese. Whole-fat or sweetened yogurt. Full-fat cheese. Nondairy creamers. Whipped toppings. Processed cheese and cheese spreads. Fats and oils Butter. Stick margarine. Lard. Shortening. Ghee. Bacon fat. Tropical oils, such as coconut,  palm kernel, or palm oil. Seasoning  and other foods Salted popcorn and pretzels. Onion salt, garlic salt, seasoned salt, table salt, and sea salt. Worcestershire sauce. Tartar sauce. Barbecue sauce. Teriyaki sauce. Soy sauce, including reduced-sodium. Steak sauce. Canned and packaged gravies. Fish sauce. Oyster sauce. Cocktail sauce. Horseradish that you find on the shelf. Ketchup. Mustard. Meat flavorings and tenderizers. Bouillon cubes. Hot sauce and Tabasco sauce. Premade or packaged marinades. Premade or packaged taco seasonings. Relishes. Regular salad dressings. Where to find more information:  National Heart, Lung, and [place]: https://wilson-eaton.com/  American Heart Association: www.heart.org Summary  The DASH eating plan is a healthy eating plan that has been shown to reduce high blood pressure (hypertension). It may also reduce your risk for type 2 diabetes, heart disease, and stroke.  With the DASH eating plan, you should limit salt (sodium) intake to 2,300 mg a day. If you have hypertension, you may need to reduce your sodium intake to 1,500 mg a day.  When on the DASH eating plan, aim to eat more fresh fruits and vegetables, whole grains, lean proteins, low-fat dairy, and heart-healthy fats.  Work with your health care provider or diet and nutrition specialist (dietitian) to adjust your eating plan to your individual calorie needs. This information is not intended to replace advice given to you by your health care provider. Make sure you discuss any questions you have with your health care provider. Document Revised: 04/08/2017 Document Reviewed: 04/19/2016 Elsevier Patient Education  [place].  Hypertension, Adult High blood pressure (hypertension) is when the force of blood pumping through the arteries is too strong. The arteries are the blood vessels that carry blood from the heart throughout the body. Hypertension forces the heart to work harder to pump blood and  may cause arteries to become narrow or stiff. Untreated or uncontrolled hypertension can cause a heart attack, heart failure, a stroke, kidney disease, and other problems. A blood pressure reading consists of a higher number over a lower number. Ideally, your blood pressure should be below 120/80. The first ("top") number is called the systolic pressure. It is a measure of the pressure in your arteries as your heart beats. The second ("bottom") number is called the diastolic pressure. It is a measure of the pressure in your arteries as the heart relaxes. What are the causes? The exact cause of this condition is not known. There are some conditions that result in or are related to high blood pressure. What increases the risk? Some risk factors for high blood pressure are under your control. The following factors may make you more likely to develop this condition:  Smoking.  Having type 2 diabetes mellitus, high cholesterol, or both.  Not getting enough exercise or physical activity.  Being overweight.  Having too much fat, sugar, calories, or salt (sodium) in your diet.  Drinking too much alcohol. Some risk factors for high blood pressure may be difficult or impossible to change. Some of these factors include:  Having chronic kidney disease.  Having a family history of high blood pressure.  Age. Risk increases with age.  Race. You may be at higher risk if you are African American.  Gender. Men are at higher risk than women before age 17. After age 71, women are at higher risk than men.  Having obstructive sleep apnea.  Stress. What are the signs or symptoms? High blood pressure may not cause symptoms. Very high blood pressure (hypertensive crisis) may cause:  Headache.  Anxiety.  Shortness of breath.  Nosebleed.  Nausea  and vomiting.  Vision changes.  Severe chest pain.  Seizures. How is this diagnosed? This condition is diagnosed by measuring your blood pressure  while you are seated, with your arm resting on a flat surface, your legs uncrossed, and your feet flat on the floor. The cuff of the blood pressure monitor will be placed directly against the skin of your upper arm at the level of your heart. It should be measured at least twice using the same arm. Certain conditions can cause a difference in blood pressure between your right and left arms. Certain factors can cause blood pressure readings to be lower or higher than normal for a short period of time:  When your blood pressure is higher when you are in a health care provider's office than when you are at home, this is called white coat hypertension. Most people with this condition do not need medicines.  When your blood pressure is higher at home than when you are in a health care provider's office, this is called masked hypertension. Most people with this condition may need medicines to control blood pressure. If you have a high blood pressure reading during one visit or you have normal blood pressure with other risk factors, you may be asked to:  Return on a different day to have your blood pressure checked again.  Monitor your blood pressure at home for 1 week or longer. If you are diagnosed with hypertension, you may have other blood or imaging tests to help your health care provider understand your overall risk for other conditions. How is this treated? This condition is treated by making healthy lifestyle changes, such as eating healthy foods, exercising more, and reducing your alcohol intake. Your health care provider may prescribe medicine if lifestyle changes are not enough to get your blood pressure under control, and if:  Your systolic blood pressure is above 130.  Your diastolic blood pressure is above 80. Your personal target blood pressure may vary depending on your medical conditions, your age, and other factors. Follow these instructions at home: Eating and drinking   Eat a diet  that is high in fiber and potassium, and low in sodium, added sugar, and fat. An example eating plan is called the DASH (Dietary Approaches to Stop Hypertension) diet. To eat this way: ? Eat plenty of fresh fruits and vegetables. Try to fill one half of your plate at each meal with fruits and vegetables. ? Eat whole grains, such as whole-wheat pasta, brown rice, or whole-grain bread. Fill about one fourth of your plate with whole grains. ? Eat or drink low-fat dairy products, such as skim milk or low-fat yogurt. ? Avoid fatty cuts of meat, processed or cured meats, and poultry with skin. Fill about one fourth of your plate with lean proteins, such as fish, chicken without skin, beans, eggs, or tofu. ? Avoid pre-made and processed foods. These tend to be higher in sodium, added sugar, and fat.  Reduce your daily sodium intake. Most people with hypertension should eat less than 1,500 mg of sodium a day.  Do not drink alcohol if: ? Your health care provider tells you not to drink. ? You are pregnant, may be pregnant, or are planning to become pregnant.  If you drink alcohol: ? Limit how much you use to:  0-1 drink a day for women.  0-2 drinks a day for men. ? Be aware of how much alcohol is in your drink. In the [place] one drink equals one 12  oz bottle of beer (355 mL), one 5 oz glass of wine (148 mL), or one 1 oz glass of hard liquor (44 mL). Lifestyle   Work with your health care provider to maintain a healthy body weight or to lose weight. Ask what an ideal weight is for you.  Get at least 30 minutes of exercise most days of the week. Activities may include walking, swimming, or biking.  Include exercise to strengthen your muscles (resistance exercise), such as Pilates or lifting weights, as part of your weekly exercise routine. Try to do these types of exercises for 30 minutes at least 3 days a week.  Do not use any products that contain nicotine or tobacco, such as cigarettes,  e-cigarettes, and chewing tobacco. If you need help quitting, ask your health care provider.  Monitor your blood pressure at home as told by your health care provider.  Keep all follow-up visits as told by your health care provider. This is important. Medicines  Take over-the-counter and prescription medicines only as told by your health care provider. Follow directions carefully. Blood pressure medicines must be taken as prescribed.  Do not skip doses of blood pressure medicine. Doing this puts you at risk for problems and can make the medicine less effective.  Ask your health care provider about side effects or reactions to medicines that you should watch for. Contact a health care provider if you:  Think you are having a reaction to a medicine you are taking.  Have headaches that keep coming back (recurring).  Feel dizzy.  Have swelling in your ankles.  Have trouble with your vision. Get help right away if you:  Develop a severe headache or confusion.  Have unusual weakness or numbness.  Feel faint.  Have severe pain in your chest or abdomen.  Vomit repeatedly.  Have trouble breathing. Summary  Hypertension is when the force of blood pumping through your arteries is too strong. If this condition is not controlled, it may put you at risk for serious complications.  Your personal target blood pressure may vary depending on your medical conditions, your age, and other factors. For most people, a normal blood pressure is less than 120/80.  Hypertension is treated with lifestyle changes, medicines, or a combination of both. Lifestyle changes include losing weight, eating a healthy, low-sodium diet, exercising more, and limiting alcohol. This information is not intended to replace advice given to you by your health care provider. Make sure you discuss any questions you have with your health care provider. Document Revised: 01/04/2018 Document Reviewed: 01/04/2018 Elsevier  Patient Education  [place].  Diabetes Mellitus and Nutrition, Adult When you have diabetes (diabetes mellitus), it is very important to have healthy eating habits because your blood sugar (glucose) levels are greatly affected by what you eat and drink. Eating healthy foods in the appropriate amounts, at about the same times every day, can help you:  Control your blood glucose.  Lower your risk of heart disease.  Improve your blood pressure.  Reach or maintain a healthy weight. Every person with diabetes is different, and each person has different needs for a meal plan. Your health care provider may recommend that you work with a diet and nutrition specialist (dietitian) to make a meal plan that is best for you. Your meal plan may vary depending on factors such as:  The calories you need.  The medicines you take.  Your weight.  Your blood glucose, blood pressure, and cholesterol levels.  Your  activity level.  Other health conditions you have, such as heart or kidney disease. How do carbohydrates affect me? Carbohydrates, also called carbs, affect your blood glucose level more than any other type of food. Eating carbs naturally raises the amount of glucose in your blood. Carb counting is a method for keeping track of how many carbs you eat. Counting carbs is important to keep your blood glucose at a healthy level, especially if you use insulin or take certain oral diabetes medicines. It is important to know how many carbs you can safely have in each meal. This is different for every person. Your dietitian can help you calculate how many carbs you should have at each meal and for each snack. Foods that contain carbs include:  Bread, cereal, rice, pasta, and crackers.  Potatoes and corn.  Peas, beans, and lentils.  Milk and yogurt.  Fruit and juice.  Desserts, such as cakes, cookies, ice cream, and candy. How does alcohol affect me? Alcohol can cause a sudden decrease  in blood glucose (hypoglycemia), especially if you use insulin or take certain oral diabetes medicines. Hypoglycemia can be a life-threatening condition. Symptoms of hypoglycemia (sleepiness, dizziness, and confusion) are similar to symptoms of having too much alcohol. If your health care provider says that alcohol is safe for you, follow these guidelines:  Limit alcohol intake to no more than 1 drink per day for nonpregnant women and 2 drinks per day for men. One drink equals 12 oz of beer, 5 oz of wine, or 1 oz of hard liquor.  Do not drink on an empty stomach.  Keep yourself hydrated with water, diet soda, or unsweetened iced tea.  Keep in mind that regular soda, juice, and other mixers may contain a lot of sugar and must be counted as carbs. What are tips for following this plan?  Reading food labels  Start by checking the serving size on the "Nutrition Facts" label of packaged foods and drinks. The amount of calories, carbs, fats, and other nutrients listed on the label is based on one serving of the item. Many items contain more than one serving per package.  Check the total grams (g) of carbs in one serving. You can calculate the number of servings of carbs in one serving by dividing the total carbs by 15. For example, if a food has 30 g of total carbs, it would be equal to 2 servings of carbs.  Check the number of grams (g) of saturated and trans fats in one serving. Choose foods that have low or no amount of these fats.  Check the number of milligrams (mg) of salt (sodium) in one serving. Most people should limit total sodium intake to less than 2,300 mg per day.  Always check the nutrition information of foods labeled as "low-fat" or "nonfat". These foods may be higher in added sugar or refined carbs and should be avoided.  Talk to your dietitian to identify your daily goals for nutrients listed on the label. Shopping  Avoid buying canned, premade, or processed foods. These  foods tend to be high in fat, sodium, and added sugar.  Shop around the outside edge of the grocery store. This includes fresh fruits and vegetables, bulk grains, fresh meats, and fresh dairy. Cooking  Use low-heat cooking methods, such as baking, instead of high-heat cooking methods like deep frying.  Cook using healthy oils, such as olive, canola, or sunflower oil.  Avoid cooking with butter, cream, or high-fat meats. Meal planning  Eat meals and snacks regularly, preferably at the same times every day. Avoid going long periods of time without eating.  Eat foods high in fiber, such as fresh fruits, vegetables, beans, and whole grains. Talk to your dietitian about how many servings of carbs you can eat at each meal.  Eat 4-6 ounces (oz) of lean protein each day, such as lean meat, chicken, fish, eggs, or tofu. One oz of lean protein is equal to: ? 1 oz of meat, chicken, or fish. ? 1 egg. ?  cup of tofu.  Eat some foods each day that contain healthy fats, such as avocado, nuts, seeds, and fish. Lifestyle  Check your blood glucose regularly.  Exercise regularly as told by your health care provider. This may include: ? 150 minutes of moderate-intensity or vigorous-intensity exercise each week. This could be brisk walking, biking, or water aerobics. ? Stretching and doing strength exercises, such as yoga or weightlifting, at least 2 times a week.  Take medicines as told by your health care provider.  Do not use any products that contain nicotine or tobacco, such as cigarettes and e-cigarettes. If you need help quitting, ask your health care provider.  Work with a Social worker or diabetes educator to identify strategies to manage stress and any emotional and social challenges. Questions to ask a health care provider  Do I need to meet with a diabetes educator?  Do I need to meet with a dietitian?  What number can I call if I have questions?  When are the best times to check my  blood glucose? Where to find more information:  American Diabetes Association: diabetes.org  Academy of Nutrition and Dietetics: www.eatright.[employer] of Diabetes and Digestive and Kidney Diseases (NIH): [employer] Summary  A healthy meal plan will help you control your blood glucose and maintain a healthy lifestyle.  Working with a diet and nutrition specialist (dietitian) can help you make a meal plan that is best for you.  Keep in mind that carbohydrates (carbs) and alcohol have immediate effects on your blood glucose levels. It is important to count carbs and to use alcohol carefully. This information is not intended to replace advice given to you by your health care provider. Make sure you discuss any questions you have with your health care provider. Document Revised: 04/08/2017 Document Reviewed: 05/31/2016 Elsevier Patient Education  2020 [employer]      Agustina Caroli, MD [facility]

## 2020-02-07 NOTE — Patient Instructions (Addendum)
If you have lab work done today you will be contacted with your lab results within the next 2 weeks.  If you have not heard from [place] then please contact us. The fastest way to get your results is to register for My Chart.   IF you received an x-ray today, you will receive an invoice from [facility]. Please contact [facility] at [phone] with questions or concerns regarding your invoice.   IF you received labwork today, you will receive an invoice from [place]. Please contact [facility] at [phone] with questions or concerns regarding your invoice.   Our billing staff will not be able to assist you with questions regarding bills from these companies.  You will be contacted with the lab results as soon as they are available. The fastest way to get your results is to activate your My Chart account. Instructions are located on the last page of this paperwork. If you have not heard from [place] regarding the results in 2 weeks, please contact this office.     Diabetes Mellitus and Foot Care Foot care is an important part of your health, especially when you have diabetes. Diabetes may cause you to have problems because of poor blood flow (circulation) to your feet and legs, which can cause your skin to:  Become thinner and drier.  Break more easily.  Heal more slowly.  Peel and crack. You may also have nerve damage (neuropathy) in your legs and feet, causing decreased feeling in them. This means that you may not notice minor injuries to your feet that could lead to more serious problems. Noticing and addressing any potential problems early is the best way to prevent future foot problems. How to care for your feet Foot hygiene  Wash your feet daily with warm water and mild soap. Do not use hot water. Then, pat your feet and the areas between your toes until they are completely dry. Do not soak your feet as this can dry your skin.  Trim your toenails straight across.  Do not dig under them or around the cuticle. File the edges of your nails with an emery board or nail file.  Apply a moisturizing lotion or petroleum jelly to the skin on your feet and to dry, brittle toenails. Use lotion that does not contain alcohol and is unscented. Do not apply lotion between your toes. Shoes and socks  Wear clean socks or stockings every day. Make sure they are not too tight. Do not wear knee-high stockings since they may decrease blood flow to your legs.  Wear shoes that fit properly and have enough cushioning. Always look in your shoes before you put them on to be sure there are no objects inside.  To break in new shoes, wear them for just a few hours a day. This prevents injuries on your feet. Wounds, scrapes, corns, and calluses  Check your feet daily for blisters, cuts, bruises, sores, and redness. If you cannot see the bottom of your feet, use a mirror or ask someone for help.  Do not cut corns or calluses or try to remove them with medicine.  If you find a minor scrape, cut, or break in the skin on your feet, keep it and the skin around it clean and dry. You may clean these areas with mild soap and water. Do not clean the area with peroxide, alcohol, or iodine.  If you have a wound, scrape, corn, or callus on your foot, look at it  several times a day to make sure it is healing and not infected. Check for: ? Redness, swelling, or pain. ? Fluid or blood. ? Warmth. ? Pus or a bad smell. General instructions  Do not cross your legs. This may decrease blood flow to your feet.  Do not use heating pads or hot water bottles on your feet. They may burn your skin. If you have lost feeling in your feet or legs, you may not know this is happening until it is too late.  Protect your feet from hot and cold by wearing shoes, such as at the beach or on hot pavement.  Schedule a complete foot exam at least once a year (annually) or more often if you have foot problems. If  you have foot problems, report any cuts, sores, or bruises to your health care provider immediately. Contact a health care provider if:  You have a medical condition that increases your risk of infection and you have any cuts, sores, or bruises on your feet.  You have an injury that is not healing.  You have redness on your legs or feet.  You feel burning or tingling in your legs or feet.  You have pain or cramps in your legs and feet.  Your legs or feet are numb.  Your feet always feel cold.  You have pain around a toenail. Get help right away if:  You have a wound, scrape, corn, or callus on your foot and: ? You have pain, swelling, or redness that gets worse. ? You have fluid or blood coming from the wound, scrape, corn, or callus. ? Your wound, scrape, corn, or callus feels warm to the touch. ? You have pus or a bad smell coming from the wound, scrape, corn, or callus. ? You have a fever. ? You have a red line going up your leg. Summary  Check your feet every day for cuts, sores, red spots, swelling, and blisters.  Moisturize feet and legs daily.  Wear shoes that fit properly and have enough cushioning.  If you have foot problems, report any cuts, sores, or bruises to your health care provider immediately.  Schedule a complete foot exam at least once a year (annually) or more often if you have foot problems. This information is not intended to replace advice given to you by your health care provider. Make sure you discuss any questions you have with your health care provider. Document Revised: 01/17/2019 Document Reviewed: 05/28/2016 Elsevier Patient Education  [place] DASH stands for "Dietary Approaches to Stop Hypertension." The DASH eating plan is a healthy eating plan that has been shown to reduce high blood pressure (hypertension). It may also reduce your risk for type 2 diabetes, heart disease, and stroke. The DASH eating plan may  also help with weight loss. What are tips for following this plan?  General guidelines  Avoid eating more than 2,300 mg (milligrams) of salt (sodium) a day. If you have hypertension, you may need to reduce your sodium intake to 1,500 mg a day.  Limit alcohol intake to no more than 1 drink a day for nonpregnant women and 2 drinks a day for men. One drink equals 12 oz of beer, 5 oz of wine, or 1 oz of hard liquor.  Work with your health care provider to maintain a healthy body weight or to lose weight. Ask what an ideal weight is for you.  Get at least 30 minutes of exercise that  causes your heart to beat faster (aerobic exercise) most days of the week. Activities may include walking, swimming, or biking.  Work with your health care provider or diet and nutrition specialist (dietitian) to adjust your eating plan to your individual calorie needs. Reading food labels   Check food labels for the amount of sodium per serving. Choose foods with less than 5 percent of the Daily Value of sodium. Generally, foods with less than 300 mg of sodium per serving fit into this eating plan.  To find whole grains, look for the word "whole" as the first word in the ingredient list. Shopping  Buy products labeled as "low-sodium" or "no salt added."  Buy fresh foods. Avoid canned foods and premade or frozen meals. Cooking  Avoid adding salt when cooking. Use salt-free seasonings or herbs instead of table salt or sea salt. Check with your health care provider or pharmacist before using salt substitutes.  Do not fry foods. Cook foods using healthy methods such as baking, boiling, grilling, and broiling instead.  Cook with heart-healthy oils, such as olive, canola, soybean, or sunflower oil. Meal planning  Eat a balanced diet that includes: ? 5 or more servings of fruits and vegetables each day. At each meal, try to fill half of your plate with fruits and vegetables. ? Up to 6-8 servings of whole grains  each day. ? Less than 6 oz of lean meat, poultry, or fish each day. A 3-oz serving of meat is about the same size as a deck of cards. One egg equals 1 oz. ? 2 servings of low-fat dairy each day. ? A serving of nuts, seeds, or beans 5 times each week. ? Heart-healthy fats. Healthy fats called Omega-3 fatty acids are found in foods such as flaxseeds and coldwater fish, like sardines, salmon, and mackerel.  Limit how much you eat of the following: ? Canned or prepackaged foods. ? Food that is high in trans fat, such as fried foods. ? Food that is high in saturated fat, such as fatty meat. ? Sweets, desserts, sugary drinks, and other foods with added sugar. ? Full-fat dairy products.  Do not salt foods before eating.  Try to eat at least 2 vegetarian meals each week.  Eat more home-cooked food and less restaurant, buffet, and fast food.  When eating at a restaurant, ask that your food be prepared with less salt or no salt, if possible. What foods are recommended? The items listed may not be a complete list. Talk with your dietitian about what dietary choices are best for you. Grains Whole-grain or whole-wheat bread. Whole-grain or whole-wheat pasta. Brown rice. [place]. Bulgur. Whole-grain and low-sodium cereals. Pita bread. Low-fat, low-sodium crackers. Whole-wheat flour tortillas. Vegetables Fresh or frozen vegetables (raw, steamed, roasted, or grilled). Low-sodium or reduced-sodium tomato and vegetable juice. Low-sodium or reduced-sodium tomato sauce and tomato paste. Low-sodium or reduced-sodium canned vegetables. Fruits All fresh, dried, or frozen fruit. Canned fruit in natural juice (without added sugar). Meat and other protein foods Skinless chicken or [place]. Ground chicken or [place]. Pork with fat trimmed off. Fish and seafood. Egg whites. Dried beans, peas, or lentils. Unsalted nuts, nut butters, and seeds. Unsalted canned beans. Lean cuts of beef with fat trimmed off.  Low-sodium, lean deli meat. Dairy Low-fat (1%) or fat-free (skim) milk. Fat-free, low-fat, or reduced-fat cheeses. Nonfat, low-sodium ricotta or cottage cheese. Low-fat or nonfat yogurt. Low-fat, low-sodium cheese. Fats and oils Soft margarine without trans fats. Vegetable oil. Low-fat, reduced-fat, or light  mayonnaise and salad dressings (reduced-sodium). Canola, safflower, olive, soybean, and sunflower oils. Avocado. Seasoning and other foods Herbs. Spices. Seasoning mixes without salt. Unsalted popcorn and pretzels. Fat-free sweets. What foods are not recommended? The items listed may not be a complete list. Talk with your dietitian about what dietary choices are best for you. Grains Baked goods made with fat, such as croissants, muffins, or some breads. Dry pasta or rice meal packs. Vegetables Creamed or fried vegetables. Vegetables in a cheese sauce. Regular canned vegetables (not low-sodium or reduced-sodium). Regular canned tomato sauce and paste (not low-sodium or reduced-sodium). Regular tomato and vegetable juice (not low-sodium or reduced-sodium). Angie Fava. Olives. Fruits Canned fruit in a light or heavy syrup. Fried fruit. Fruit in cream or butter sauce. Meat and other protein foods Fatty cuts of meat. Ribs. Fried meat. Berniece Salines. Sausage. Bologna and other processed lunch meats. Salami. Fatback. Hotdogs. Bratwurst. Salted nuts and seeds. Canned beans with added salt. Canned or smoked fish. Whole eggs or egg yolks. Chicken or [place] with skin. Dairy Whole or 2% milk, cream, and half-and-half. Whole or full-fat cream cheese. Whole-fat or sweetened yogurt. Full-fat cheese. Nondairy creamers. Whipped toppings. Processed cheese and cheese spreads. Fats and oils Butter. Stick margarine. Lard. Shortening. Ghee. Bacon fat. Tropical oils, such as coconut, palm kernel, or palm oil. Seasoning and other foods Salted popcorn and pretzels. Onion salt, garlic salt, seasoned salt, table salt, and sea  salt. Worcestershire sauce. Tartar sauce. Barbecue sauce. Teriyaki sauce. Soy sauce, including reduced-sodium. Steak sauce. Canned and packaged gravies. Fish sauce. Oyster sauce. Cocktail sauce. Horseradish that you find on the shelf. Ketchup. Mustard. Meat flavorings and tenderizers. Bouillon cubes. Hot sauce and Tabasco sauce. Premade or packaged marinades. Premade or packaged taco seasonings. Relishes. Regular salad dressings. Where to find more information:  National Heart, Lung, and [place]: https://wilson-eaton.com/  American Heart Association: www.heart.org Summary  The DASH eating plan is a healthy eating plan that has been shown to reduce high blood pressure (hypertension). It may also reduce your risk for type 2 diabetes, heart disease, and stroke.  With the DASH eating plan, you should limit salt (sodium) intake to 2,300 mg a day. If you have hypertension, you may need to reduce your sodium intake to 1,500 mg a day.  When on the DASH eating plan, aim to eat more fresh fruits and vegetables, whole grains, lean proteins, low-fat dairy, and heart-healthy fats.  Work with your health care provider or diet and nutrition specialist (dietitian) to adjust your eating plan to your individual calorie needs. This information is not intended to replace advice given to you by your health care provider. Make sure you discuss any questions you have with your health care provider. Document Revised: 04/08/2017 Document Reviewed: 04/19/2016 Elsevier Patient Education  [place].  Hypertension, Adult High blood pressure (hypertension) is when the force of blood pumping through the arteries is too strong. The arteries are the blood vessels that carry blood from the heart throughout the body. Hypertension forces the heart to work harder to pump blood and may cause arteries to become narrow or stiff. Untreated or uncontrolled hypertension can cause a heart attack, heart failure, a stroke, kidney  disease, and other problems. A blood pressure reading consists of a higher number over a lower number. Ideally, your blood pressure should be below 120/80. The first ("top") number is called the systolic pressure. It is a measure of the pressure in your arteries as your heart beats. The second ("bottom") number is  called the diastolic pressure. It is a measure of the pressure in your arteries as the heart relaxes. What are the causes? The exact cause of this condition is not known. There are some conditions that result in or are related to high blood pressure. What increases the risk? Some risk factors for high blood pressure are under your control. The following factors may make you more likely to develop this condition:  Smoking.  Having type 2 diabetes mellitus, high cholesterol, or both.  Not getting enough exercise or physical activity.  Being overweight.  Having too much fat, sugar, calories, or salt (sodium) in your diet.  Drinking too much alcohol. Some risk factors for high blood pressure may be difficult or impossible to change. Some of these factors include:  Having chronic kidney disease.  Having a family history of high blood pressure.  Age. Risk increases with age.  Race. You may be at higher risk if you are African American.  Gender. Men are at higher risk than women before age 86. After age 67, women are at higher risk than men.  Having obstructive sleep apnea.  Stress. What are the signs or symptoms? High blood pressure may not cause symptoms. Very high blood pressure (hypertensive crisis) may cause:  Headache.  Anxiety.  Shortness of breath.  Nosebleed.  Nausea and vomiting.  Vision changes.  Severe chest pain.  Seizures. How is this diagnosed? This condition is diagnosed by measuring your blood pressure while you are seated, with your arm resting on a flat surface, your legs uncrossed, and your feet flat on the floor. The cuff of the blood  pressure monitor will be placed directly against the skin of your upper arm at the level of your heart. It should be measured at least twice using the same arm. Certain conditions can cause a difference in blood pressure between your right and left arms. Certain factors can cause blood pressure readings to be lower or higher than normal for a short period of time:  When your blood pressure is higher when you are in a health care provider's office than when you are at home, this is called white coat hypertension. Most people with this condition do not need medicines.  When your blood pressure is higher at home than when you are in a health care provider's office, this is called masked hypertension. Most people with this condition may need medicines to control blood pressure. If you have a high blood pressure reading during one visit or you have normal blood pressure with other risk factors, you may be asked to:  Return on a different day to have your blood pressure checked again.  Monitor your blood pressure at home for 1 week or longer. If you are diagnosed with hypertension, you may have other blood or imaging tests to help your health care provider understand your overall risk for other conditions. How is this treated? This condition is treated by making healthy lifestyle changes, such as eating healthy foods, exercising more, and reducing your alcohol intake. Your health care provider may prescribe medicine if lifestyle changes are not enough to get your blood pressure under control, and if:  Your systolic blood pressure is above 130.  Your diastolic blood pressure is above 80. Your personal target blood pressure may vary depending on your medical conditions, your age, and other factors. Follow these instructions at home: Eating and drinking   Eat a diet that is high in fiber and potassium, and low in sodium, added  sugar, and fat. An example eating plan is called the DASH (Dietary  Approaches to Stop Hypertension) diet. To eat this way: ? Eat plenty of fresh fruits and vegetables. Try to fill one half of your plate at each meal with fruits and vegetables. ? Eat whole grains, such as whole-wheat pasta, brown rice, or whole-grain bread. Fill about one fourth of your plate with whole grains. ? Eat or drink low-fat dairy products, such as skim milk or low-fat yogurt. ? Avoid fatty cuts of meat, processed or cured meats, and poultry with skin. Fill about one fourth of your plate with lean proteins, such as fish, chicken without skin, beans, eggs, or tofu. ? Avoid pre-made and processed foods. These tend to be higher in sodium, added sugar, and fat.  Reduce your daily sodium intake. Most people with hypertension should eat less than 1,500 mg of sodium a day.  Do not drink alcohol if: ? Your health care provider tells you not to drink. ? You are pregnant, may be pregnant, or are planning to become pregnant.  If you drink alcohol: ? Limit how much you use to:  0-1 drink a day for women.  0-2 drinks a day for men. ? Be aware of how much alcohol is in your drink. In the [place] one drink equals one 12 oz bottle of beer (355 mL), one 5 oz glass of wine (148 mL), or one 1 oz glass of hard liquor (44 mL). Lifestyle   Work with your health care provider to maintain a healthy body weight or to lose weight. Ask what an ideal weight is for you.  Get at least 30 minutes of exercise most days of the week. Activities may include walking, swimming, or biking.  Include exercise to strengthen your muscles (resistance exercise), such as Pilates or lifting weights, as part of your weekly exercise routine. Try to do these types of exercises for 30 minutes at least 3 days a week.  Do not use any products that contain nicotine or tobacco, such as cigarettes, e-cigarettes, and chewing tobacco. If you need help quitting, ask your health care provider.  Monitor your blood pressure at home as  told by your health care provider.  Keep all follow-up visits as told by your health care provider. This is important. Medicines  Take over-the-counter and prescription medicines only as told by your health care provider. Follow directions carefully. Blood pressure medicines must be taken as prescribed.  Do not skip doses of blood pressure medicine. Doing this puts you at risk for problems and can make the medicine less effective.  Ask your health care provider about side effects or reactions to medicines that you should watch for. Contact a health care provider if you:  Think you are having a reaction to a medicine you are taking.  Have headaches that keep coming back (recurring).  Feel dizzy.  Have swelling in your ankles.  Have trouble with your vision. Get help right away if you:  Develop a severe headache or confusion.  Have unusual weakness or numbness.  Feel faint.  Have severe pain in your chest or abdomen.  Vomit repeatedly.  Have trouble breathing. Summary  Hypertension is when the force of blood pumping through your arteries is too strong. If this condition is not controlled, it may put you at risk for serious complications.  Your personal target blood pressure may vary depending on your medical conditions, your age, and other factors. For most people, a normal blood pressure is  less than 120/80.  Hypertension is treated with lifestyle changes, medicines, or a combination of both. Lifestyle changes include losing weight, eating a healthy, low-sodium diet, exercising more, and limiting alcohol. This information is not intended to replace advice given to you by your health care provider. Make sure you discuss any questions you have with your health care provider. Document Revised: 01/04/2018 Document Reviewed: 01/04/2018 Elsevier Patient Education  [place].  Diabetes Mellitus and Nutrition, Adult When you have diabetes (diabetes mellitus), it is very  important to have healthy eating habits because your blood sugar (glucose) levels are greatly affected by what you eat and drink. Eating healthy foods in the appropriate amounts, at about the same times every day, can help you:  Control your blood glucose.  Lower your risk of heart disease.  Improve your blood pressure.  Reach or maintain a healthy weight. Every person with diabetes is different, and each person has different needs for a meal plan. Your health care provider may recommend that you work with a diet and nutrition specialist (dietitian) to make a meal plan that is best for you. Your meal plan may vary depending on factors such as:  The calories you need.  The medicines you take.  Your weight.  Your blood glucose, blood pressure, and cholesterol levels.  Your activity level.  Other health conditions you have, such as heart or kidney disease. How do carbohydrates affect me? Carbohydrates, also called carbs, affect your blood glucose level more than any other type of food. Eating carbs naturally raises the amount of glucose in your blood. Carb counting is a method for keeping track of how many carbs you eat. Counting carbs is important to keep your blood glucose at a healthy level, especially if you use insulin or take certain oral diabetes medicines. It is important to know how many carbs you can safely have in each meal. This is different for every person. Your dietitian can help you calculate how many carbs you should have at each meal and for each snack. Foods that contain carbs include:  Bread, cereal, rice, pasta, and crackers.  Potatoes and corn.  Peas, beans, and lentils.  Milk and yogurt.  Fruit and juice.  Desserts, such as cakes, cookies, ice cream, and candy. How does alcohol affect me? Alcohol can cause a sudden decrease in blood glucose (hypoglycemia), especially if you use insulin or take certain oral diabetes medicines. Hypoglycemia can be a  life-threatening condition. Symptoms of hypoglycemia (sleepiness, dizziness, and confusion) are similar to symptoms of having too much alcohol. If your health care provider says that alcohol is safe for you, follow these guidelines:  Limit alcohol intake to no more than 1 drink per day for nonpregnant women and 2 drinks per day for men. One drink equals 12 oz of beer, 5 oz of wine, or 1 oz of hard liquor.  Do not drink on an empty stomach.  Keep yourself hydrated with water, diet soda, or unsweetened iced tea.  Keep in mind that regular soda, juice, and other mixers may contain a lot of sugar and must be counted as carbs. What are tips for following this plan?  Reading food labels  Start by checking the serving size on the "Nutrition Facts" label of packaged foods and drinks. The amount of calories, carbs, fats, and other nutrients listed on the label is based on one serving of the item. Many items contain more than one serving per package.  Check the total grams (g)  of carbs in one serving. You can calculate the number of servings of carbs in one serving by dividing the total carbs by 15. For example, if a food has 30 g of total carbs, it would be equal to 2 servings of carbs.  Check the number of grams (g) of saturated and trans fats in one serving. Choose foods that have low or no amount of these fats.  Check the number of milligrams (mg) of salt (sodium) in one serving. Most people should limit total sodium intake to less than 2,300 mg per day.  Always check the nutrition information of foods labeled as "low-fat" or "nonfat". These foods may be higher in added sugar or refined carbs and should be avoided.  Talk to your dietitian to identify your daily goals for nutrients listed on the label. Shopping  Avoid buying canned, premade, or processed foods. These foods tend to be high in fat, sodium, and added sugar.  Shop around the outside edge of the grocery store. This includes fresh  fruits and vegetables, bulk grains, fresh meats, and fresh dairy. Cooking  Use low-heat cooking methods, such as baking, instead of high-heat cooking methods like deep frying.  Cook using healthy oils, such as olive, canola, or sunflower oil.  Avoid cooking with butter, cream, or high-fat meats. Meal planning  Eat meals and snacks regularly, preferably at the same times every day. Avoid going long periods of time without eating.  Eat foods high in fiber, such as fresh fruits, vegetables, beans, and whole grains. Talk to your dietitian about how many servings of carbs you can eat at each meal.  Eat 4-6 ounces (oz) of lean protein each day, such as lean meat, chicken, fish, eggs, or tofu. One oz of lean protein is equal to: ? 1 oz of meat, chicken, or fish. ? 1 egg. ?  cup of tofu.  Eat some foods each day that contain healthy fats, such as avocado, nuts, seeds, and fish. Lifestyle  Check your blood glucose regularly.  Exercise regularly as told by your health care provider. This may include: ? 150 minutes of moderate-intensity or vigorous-intensity exercise each week. This could be brisk walking, biking, or water aerobics. ? Stretching and doing strength exercises, such as yoga or weightlifting, at least 2 times a week.  Take medicines as told by your health care provider.  Do not use any products that contain nicotine or tobacco, such as cigarettes and e-cigarettes. If you need help quitting, ask your health care provider.  Work with a Social worker or diabetes educator to identify strategies to manage stress and any emotional and social challenges. Questions to ask a health care provider  Do I need to meet with a diabetes educator?  Do I need to meet with a dietitian?  What number can I call if I have questions?  When are the best times to check my blood glucose? Where to find more information:  American Diabetes Association: diabetes.org  Academy of Nutrition and  Dietetics: www.eatright.[employer] of Diabetes and Digestive and Kidney Diseases (NIH): [employer] Summary  A healthy meal plan will help you control your blood glucose and maintain a healthy lifestyle.  Working with a diet and nutrition specialist (dietitian) can help you make a meal plan that is best for you.  Keep in mind that carbohydrates (carbs) and alcohol have immediate effects on your blood glucose levels. It is important to count carbs and to use alcohol carefully. This information is not intended to  replace advice given to you by your health care provider. Make sure you discuss any questions you have with your health care provider. Document Revised: 04/08/2017 Document Reviewed: 05/31/2016 Elsevier Patient Education  2020 [employer].

## 2020-02-07 NOTE — Telephone Encounter (Signed)
Called [facility] pharmacy to confirm Rxs amlodipine, Trulicity, glipizide and rosuvastatin were sent on 01/10/2020, patient told provider they were not sent. Per pharmacist Zenia Resides) they were received, but patient was there last week. Today's office visit the provider made changes to the medications and resent to pharmacy.

## 2020-02-27 ENCOUNTER — Emergency Department (HOSPITAL_BASED_OUTPATIENT_CLINIC_OR_DEPARTMENT_OTHER)
Admission: EM | Admit: 2020-02-27 | Discharge: 2020-02-27 | Disposition: A | Discharge status: Home / Self Care | Payer: 59 | Attending: Emergency Medicine | Admitting: Emergency Medicine

## 2020-02-27 ENCOUNTER — Encounter (HOSPITAL_BASED_OUTPATIENT_CLINIC_OR_DEPARTMENT_OTHER): Payer: Self-pay | Admitting: Emergency Medicine

## 2020-02-27 ENCOUNTER — Other Ambulatory Visit: Payer: Self-pay

## 2020-02-27 DIAGNOSIS — L97509 Non-pressure chronic ulcer of other part of unspecified foot with unspecified severity: Secondary | ICD-10-CM | POA: Insufficient documentation

## 2020-02-27 DIAGNOSIS — L738 Other specified follicular disorders: Secondary | ICD-10-CM

## 2020-02-27 DIAGNOSIS — Z79899 Other long term (current) drug therapy: Secondary | ICD-10-CM | POA: Insufficient documentation

## 2020-02-27 DIAGNOSIS — Z7982 Long term (current) use of aspirin: Secondary | ICD-10-CM | POA: Insufficient documentation

## 2020-02-27 DIAGNOSIS — R10819 Abdominal tenderness, unspecified site: Secondary | ICD-10-CM | POA: Diagnosis not present

## 2020-02-27 DIAGNOSIS — Z87891 Personal history of nicotine dependence: Secondary | ICD-10-CM | POA: Diagnosis not present

## 2020-02-27 DIAGNOSIS — R519 Headache, unspecified: Secondary | ICD-10-CM | POA: Diagnosis present

## 2020-02-27 DIAGNOSIS — E11621 Type 2 diabetes mellitus with foot ulcer: Secondary | ICD-10-CM | POA: Diagnosis not present

## 2020-02-27 DIAGNOSIS — L731 Pseudofolliculitis barbae: Secondary | ICD-10-CM | POA: Insufficient documentation

## 2020-02-27 DIAGNOSIS — Z7984 Long term (current) use of oral hypoglycemic drugs: Secondary | ICD-10-CM | POA: Insufficient documentation

## 2020-02-27 DIAGNOSIS — I10 Essential (primary) hypertension: Secondary | ICD-10-CM | POA: Diagnosis not present

## 2020-02-27 MED ORDER — DOXYCYCLINE HYCLATE 100 MG PO CAPS: 100.0000 mg | ORAL_CAPSULE | Freq: Two times a day (BID) | ORAL | 0 refills | Status: DC

## 2020-02-27 MED ORDER — MUPIROCIN CALCIUM 2 % EX CREA: 1.0000 "application " | TOPICAL_CREAM | Freq: Two times a day (BID) | CUTANEOUS | 0 refills | Status: DC

## 2020-02-27 NOTE — ED Provider Notes (Signed)
[facility] EMERGENCY DEPARTMENT Provider Note   CSN: [id number] Arrival date & time: 02/27/20  0243     History Chief Complaint  Patient presents with  . Headache    [name] is a 39 y.o. male.  HPI     This is a 39 year old male with a history of diabetes, hypertension, hyperlipidemia who presents with bumps on his head and headache.  Patient reports that normally he shaves his head once a week.  He last shaved his head approximately 1 month ago.  He noted after shaving multiple bumps on the back of his head.  He has 1 or 2 bumps on the right side of his posterior scalp that become larger and more inflamed.  They are causing pain to radiate into the right side of his head and face.  Rates his pain at 4 out of 10.  He has not taken anything for the pain.  He has avoided shaving since that time.  He denies any fevers.  Denies neck pain or stiffness.  Past Medical History:  Diagnosis Date  . Allergies   . Allergy   . Anemia   . Anxiety   . Depression   . Diabetes mellitus   . GERD (gastroesophageal reflux disease)   . Hyperlipidemia   . Hypertension   . Lumbar disc herniation   . Neuromuscular disorder ([place])    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   . PTSD (post-traumatic stress disorder)   . Scoliosis   . Sleep apnea     Patient Active Problem List   Diagnosis Date Noted  . Diabetic ulcer of left midfoot associated with type 2 diabetes mellitus ([place]) 02/07/2020  . Morbid obesity, unspecified obesity type ([place]) 01/10/2020  . Hypertension associated with diabetes ([place]) 04/17/2018  . Dyslipidemia 04/17/2018  . PTSD (post-traumatic stress disorder) 08/11/2016  . Spinal stenosis, lumbar region with neurogenic claudication 05/05/2016  . Dysthymia 09/02/2015  . Chronic low back pain 09/02/2015  . Obesity, diabetes, and hypertension syndrome ([place]) 03/11/2008  . UNSPECIFIED ANEMIA 03/11/2008    Past Surgical History:  Procedure Laterality Date  . LUMBAR  LAMINECTOMY/DECOMPRESSION MICRODISCECTOMY Left 05/05/2016   Procedure: CENTRAL DECOMPRESSION L4-L5 AND FORAMINOTOMY FOR L4 ROOT AND L5 ROOT ON THE LEFT;  Surgeon: Latanya Maudlin, MD;  Location: [facility];  Service: Orthopedics;  Laterality: Left;  . NO PAST SURGERIES         Family History  Problem Relation Age of Onset  . Stroke Maternal Grandmother   . Colon cancer Neg Hx   . Esophageal cancer Neg Hx   . Rectal cancer Neg Hx   . Stomach cancer Neg Hx     Social History   Tobacco Use  . Smoking status: Former Smoker    Types: Cigars  . Smokeless tobacco: Current User    Types: Snuff, Chew  Vaping Use  . Vaping Use: Never used  Substance Use Topics  . Alcohol use: Yes    Alcohol/week: 5.0 standard drinks    Types: 5 Shots of liquor per week    Comment: few times year  . Drug use: No    Home Medications Prior to Admission medications   Medication Sig Start Date End Date Taking? Authorizing Provider  acetaminophen (TYLENOL) 500 MG tablet Take 1,000 mg by mouth every 6 (six) hours as needed for mild pain.    [provider]  amLODipine (NORVASC) 10 MG tablet Take 1 tablet (10 mg total) by mouth daily. 02/07/20 05/07/20  Sagardia,  Miguel Jose, MD  aspirin EC 81 MG tablet Take 1 tablet (81 mg total) by mouth daily. 07/11/14   Shaw, Eva N, MD  blood glucose meter kit and supplies Dispense based on patient and insurance preference. Use up to four times daily as directed. (FOR ICD-10 E10.9, E11.9). 01/16/19   Thompson, Daniel V, MD  doxycycline (VIBRAMYCIN) 100 MG capsule Take 1 capsule (100 mg total) by mouth 2 (two) times daily. 02/27/20   ,  F, MD  doxylamine, Sleep, (UNISOM) 25 MG tablet Take 25 mg by mouth at bedtime as needed.    [provider]  Dulaglutide (TRULICITY) 1.5 MG/0.5ML SOPN Inject 1.5 mg into the skin once a week. 02/07/20   Sagardia, Miguel Jose, MD  FLUoxetine (PROZAC) 10 MG tablet     [provider]  glipiZIDE (GLUCOTROL) 10  MG tablet Take 1 tablet (10 mg total) by mouth 2 (two) times daily before a meal. 02/07/20 05/07/20  Sagardia, Miguel Jose, MD  ibuprofen (ADVIL) 800 MG tablet Take 1 tablet (800 mg total) by mouth 3 (three) times daily. 10/05/19   Nelson, Yvonne Sue, MD  loratadine (CLARITIN) 10 MG tablet Take 10 mg by mouth as needed for allergies.    [provider]  losartan-hydrochlorothiazide (HYZAAR) 100-12.5 MG tablet Take 1 tablet by mouth daily. 02/07/20   Sagardia, Miguel Jose, MD  mupirocin cream (BACTROBAN) 2 % Apply 1 application topically 2 (two) times daily. 02/27/20   ,  F, MD  nitroGLYCERIN (NITROSTAT) 0.4 MG SL tablet Place 1 tablet (0.4 mg total) under the tongue every 5 (five) minutes as needed for chest pain. 01/16/19   Thompson, Daniel V, MD  rosuvastatin (CRESTOR) 20 MG tablet Take 1 tablet (20 mg total) by mouth daily. 02/07/20 05/07/20  Sagardia, Miguel Jose, MD  traZODone (DESYREL) 100 MG tablet TAKE 1 TABLET BY MOUTH EVERY DAY AT BEDTIME AS NEEDED FOR SLEEP 12/10/19   [provider]    Allergies    Amoxicillin, Penicillins, Ambien [zolpidem], Metformin and related, and Other  Review of Systems   Review of Systems  Constitutional: Negative for fever.  Musculoskeletal: Negative for neck pain.  Skin: Positive for rash. Negative for color change.  Neurological: Positive for headaches.  All other systems reviewed and are negative.   Physical Exam Updated Vital Signs BP (!) 164/108 (BP Location: Left Arm)   Pulse (!) 120   Temp 98.4 F (36.9 C) (Oral)   Resp 18   Ht 1.905 m (6' 3")   Wt (!) 141.5 kg   SpO2 98%   BMI 39.00 kg/m   Physical Exam Vitals and nursing note reviewed.  Constitutional:      Appearance: He is well-developed. He is obese. He is ill-appearing.  HENT:     Head: Normocephalic and atraumatic.     Comments: Multiple pustules noted over the posterior scalp, there are several areas of induration without evidence of fluctuance, no  overlying erythema    Mouth/Throat:     Mouth: Mucous membranes are moist.  Eyes:     Pupils: Pupils are equal, round, and reactive to light.  Cardiovascular:     Rate and Rhythm: Normal rate and regular rhythm.  Pulmonary:     Effort: Pulmonary effort is normal. No respiratory distress.  Abdominal:     Tenderness: There is abdominal tenderness. There is no rebound.  Musculoskeletal:     Cervical back: Neck supple.  Skin:    General: Skin is warm and dry.       Comments: See HEENT  Neurological:     Mental Status: He is alert and oriented to person, place, and time.  Psychiatric:        Mood and Affect: Mood normal.     ED Results / Procedures / Treatments   Labs (all labs ordered are listed, but only abnormal results are displayed) Labs Reviewed - No data to display  EKG None  Radiology No results found.  Procedures Procedures (including critical care time)  Medications Ordered in ED Medications - No data to display  ED Course  I have reviewed the triage vital signs and the nursing notes.  Pertinent labs & imaging results that were available during my care of the patient were reviewed by me and considered in my medical decision making (see chart for details).    MDM Rules/Calculators/A&P                          Patient presents with bumps on the back of his head and headache.  He is overall nontoxic.  Initial vital signs notable for tachycardia.  He has pustules on the posterior aspect of his head and some fluctuance but no obvious drainable abscess.  Feel his presentation is consistent with folliculitis Barbae.  Will treat with topical Bactroban and doxycycline.  Recommend discontinue shaving.  After history, exam, and medical workup I feel the patient has been appropriately medically screened and is safe for discharge home. Pertinent diagnoses were discussed with the patient. Patient was given return precautions.   Final Clinical Impression(s) / ED  Diagnoses Final diagnoses:  Folliculitis barbae    Rx / DC Orders ED Discharge Orders         Ordered    mupirocin cream (BACTROBAN) 2 %  2 times daily        02/27/20 0340    doxycycline (VIBRAMYCIN) 100 MG capsule  2 times daily        02/27/20 0340           Tytionna Cloyd, Barbette Hair, MD 02/27/20 [phone]

## 2020-02-27 NOTE — Discharge Instructions (Addendum)
You were seen today for bumps on the back of your head and headache.  This is likely folliculitis.  Take medications as prescribed.  Do not shave.

## 2020-02-27 NOTE — ED Triage Notes (Signed)
Pt states a couple of days ago he woke up and had a couple of bumps on his head  Pt states one of them has continued to get bigger and bigger  Pt states now his head, neck and jaw are hurting

## 2020-02-28 ENCOUNTER — Ambulatory Visit (INDEPENDENT_AMBULATORY_CARE_PROVIDER_SITE_OTHER): Payer: 59

## 2020-02-28 ENCOUNTER — Ambulatory Visit: Payer: 59 | Admitting: Podiatry

## 2020-02-28 DIAGNOSIS — L97421 Non-pressure chronic ulcer of left heel and midfoot limited to breakdown of skin: Secondary | ICD-10-CM | POA: Diagnosis not present

## 2020-02-28 DIAGNOSIS — E1165 Type 2 diabetes mellitus with hyperglycemia: Secondary | ICD-10-CM

## 2020-02-28 DIAGNOSIS — M2142 Flat foot [pes planus] (acquired), left foot: Secondary | ICD-10-CM

## 2020-02-28 DIAGNOSIS — M216X2 Other acquired deformities of left foot: Secondary | ICD-10-CM | POA: Diagnosis not present

## 2020-02-28 DIAGNOSIS — M2141 Flat foot [pes planus] (acquired), right foot: Secondary | ICD-10-CM

## 2020-02-28 DIAGNOSIS — L97522 Non-pressure chronic ulcer of other part of left foot with fat layer exposed: Secondary | ICD-10-CM

## 2020-02-28 DIAGNOSIS — E1142 Type 2 diabetes mellitus with diabetic polyneuropathy: Secondary | ICD-10-CM

## 2020-02-28 DIAGNOSIS — M21862 Other specified acquired deformities of left lower leg: Secondary | ICD-10-CM

## 2020-02-28 NOTE — Progress Notes (Signed)
  Subjective:  Patient ID: [name], male    DOB: [birth year],  MRN: [id number]  Chief Complaint  Patient presents with  . Wound Check    Type 2 diabetic PCP referred him here for a ucler on his foot below his big toe thats been there for 3 months. PT stated that yesterday he went outside without shoes and put the ebrake down on his car and he thinks thats how he sliced his toe open.    39 y.o. male presents with the above complaint. History confirmed with patient.  He has type 2 diabetes, his A1c is 14%.  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  The ulcer on his foot began 3 months ago when he stepped on a nail barefoot.  He try to take care of himself until recently when and it was becoming more painful that he went to urgent care who referred him to [place].  Objective:  Physical Exam: warm, good capillary refill, no trophic changes or ulcerative lesions, normal DP and PT pulses and absent protective and light touch sensation plantar foot bilaterally with gross neuropathy.  Left Foot: Small partial-thickness epidermal lysis dorsal medial hallux, does not penetrate dermis, no signs of infection.  On the plantar first MTPJ over the tibial sesamoid there is a punctate 0.5 cm ulceration with surrounding hyperkeratosis.  Granular wound base.  No signs of infection.  No exposed bone tendon joint or capsule.  Radiographs: X-ray of the left foot: No evidence of osteomyelitis, fracture of the sesamoids, first metatarsal or base of the proximal phalanx Assessment:   1. Ulcer of left heel and midfoot, limited to breakdown of skin ([place])      Plan:  Patient was evaluated and treated and all questions answered.  Patient educated on diabetes. Discussed proper diabetic foot care and discussed risks and complications of disease. Educated patient in depth on reasons to return to the office immediately should he/she discover anything concerning or new on the feet. All questions answered.  Discussed proper shoes as well.    -XR reviewed with patient -Dressing applied consisting of Iodosorb -Offload ulcer with surgical shoe and peg assist device, these were both dispensed today -Emphasized the importance of strict lasting control and how that relates to wound healing.  Lowering his A1c will be critical to healing this wound and preventing reulceration, future limb loss. -Wound cleansed and debrided  Procedure: Selective Debridement of Wound Rationale: Removal of devitalized tissue from the wound to promote healing.  Pre-Debridement Wound Measurements: 0.5 cm x 0.5 cm x 0.3 cm  Post-Debridement Wound Measurements: same as pre-debridement. Type of Debridement: sharp selective Tissue Removed: Devitalized soft-tissue Dressing: Dry, sterile, compression dressing. Disposition: Patient tolerated procedure well. Patient to return in 1 week for follow-up.   No follow-ups on file.

## 2020-03-06 ENCOUNTER — Encounter: Payer: Self-pay | Admitting: Podiatry

## 2020-03-07 MED ORDER — DOXYCYCLINE HYCLATE 100 MG PO CAPS: 100.0000 mg | ORAL_CAPSULE | Freq: Two times a day (BID) | ORAL | 0 refills | Status: DC

## 2020-03-17 ENCOUNTER — Encounter: Payer: Self-pay | Admitting: Podiatry

## 2020-03-17 ENCOUNTER — Ambulatory Visit (INDEPENDENT_AMBULATORY_CARE_PROVIDER_SITE_OTHER): Payer: 59 | Admitting: Podiatry

## 2020-03-17 ENCOUNTER — Other Ambulatory Visit: Payer: Self-pay

## 2020-03-17 DIAGNOSIS — M21862 Other specified acquired deformities of left lower leg: Secondary | ICD-10-CM

## 2020-03-17 DIAGNOSIS — L97521 Non-pressure chronic ulcer of other part of left foot limited to breakdown of skin: Secondary | ICD-10-CM

## 2020-03-17 DIAGNOSIS — M2142 Flat foot [pes planus] (acquired), left foot: Secondary | ICD-10-CM

## 2020-03-17 DIAGNOSIS — M216X2 Other acquired deformities of left foot: Secondary | ICD-10-CM

## 2020-03-17 DIAGNOSIS — E1165 Type 2 diabetes mellitus with hyperglycemia: Secondary | ICD-10-CM

## 2020-03-17 DIAGNOSIS — E1142 Type 2 diabetes mellitus with diabetic polyneuropathy: Secondary | ICD-10-CM | POA: Diagnosis not present

## 2020-03-17 DIAGNOSIS — M2141 Flat foot [pes planus] (acquired), right foot: Secondary | ICD-10-CM

## 2020-03-17 NOTE — Progress Notes (Signed)
  Subjective:  Patient ID: [name], male    DOB: Jan 15, 1981,  MRN: [id number]  Chief Complaint  Patient presents with  . Foot Ulcer    "its alot better"    39 y.o. male returns with the above complaint. History confirmed with patient.  He feels that the wound has improved significantly.  He has been wearing the peg assist shoe and has been applying mupirocin daily with adhesive bandage.  Objective:  Physical Exam: warm, good capillary refill, no trophic changes or ulcerative lesions, normal DP and PT pulses and absent protective and light touch sensation plantar foot bilaterally with gross neuropathy.  Left Foot: Significant provement since last visit, there is hyperkeratosis overlying the wound, debridement of this reveals a 0.2 x 0.1 x 0.1 cm partial-thickness ulceration with breakdown of skin   Assessment:   1. Ulcer of left foot, limited to breakdown of skin ([place])   2. Diabetic polyneuropathy associated with type 2 diabetes mellitus ([place])   3. Pes planus of both feet   4. Gastrocnemius equinus of left lower extremity       Plan:  Patient was evaluated and treated and all questions answered.  Patient educated on diabetes. Discussed proper diabetic foot care and discussed risks and complications of disease. Educated patient in depth on reasons to return to the office immediately should he/she discover anything concerning or new on the feet. All questions answered. Discussed proper shoes as well.    -XR reviewed with patient -Dressing applied consisting of Iodosorb and adhesive bandage -Continue to offload ulcer with surgical shoe and peg assist device, these were both dispensed today -Continue work on A1c reduction -Wound cleansed and debrided  Procedure: Selective Debridement of Wound Rationale: Removal of devitalized tissue from the wound to promote healing.  Pre-Debridement Wound Measurements: 0.2 x 0.1 x 0.1 cm Post-Debridement Wound Measurements: same as  pre-debridement. Type of Debridement: sharp selective Tissue Removed: Devitalized soft-tissue Dressing: Dry, sterile, compression dressing. Disposition: Patient tolerated procedure well. Patient to return in 1 week for follow-up.   Return in about 2 weeks (around 03/31/2020) for wound re-check.

## 2020-03-31 ENCOUNTER — Ambulatory Visit (INDEPENDENT_AMBULATORY_CARE_PROVIDER_SITE_OTHER): Payer: 59 | Admitting: Podiatry

## 2020-03-31 ENCOUNTER — Encounter: Payer: Self-pay | Admitting: Podiatry

## 2020-03-31 ENCOUNTER — Other Ambulatory Visit: Payer: Self-pay

## 2020-03-31 DIAGNOSIS — M216X2 Other acquired deformities of left foot: Secondary | ICD-10-CM

## 2020-03-31 DIAGNOSIS — L97521 Non-pressure chronic ulcer of other part of left foot limited to breakdown of skin: Secondary | ICD-10-CM | POA: Diagnosis not present

## 2020-03-31 DIAGNOSIS — M2142 Flat foot [pes planus] (acquired), left foot: Secondary | ICD-10-CM

## 2020-03-31 DIAGNOSIS — M21862 Other specified acquired deformities of left lower leg: Secondary | ICD-10-CM

## 2020-03-31 DIAGNOSIS — M2141 Flat foot [pes planus] (acquired), right foot: Secondary | ICD-10-CM

## 2020-03-31 DIAGNOSIS — E1165 Type 2 diabetes mellitus with hyperglycemia: Secondary | ICD-10-CM

## 2020-03-31 DIAGNOSIS — E1142 Type 2 diabetes mellitus with diabetic polyneuropathy: Secondary | ICD-10-CM

## 2020-03-31 NOTE — Progress Notes (Signed)
  Subjective:  Patient ID: [name], male    DOB: [birth year],  MRN: [id number]  Chief Complaint  Patient presents with  . Wound Check    Pt stated that he is doing and feels good he has no concerns and denies any pain at this time     39 y.o. male returns with the above complaint. History confirmed with patient. Back in regular shoes, he is using mupirocin and a bandage.  Objective:  Physical Exam: warm, good capillary refill, no trophic changes or ulcerative lesions, normal DP and PT pulses and absent protective and light touch sensation plantar foot bilaterally with gross neuropathy.   Left Foot: there is dried blood and hyperkeratosis overlying the wound, debridement of this reveals a 0.2 x 0.1 x 0.1 cm partial-thickness ulceration with breakdown of skin, stable and similar to previous visit     Assessment:   1. Ulcer of left foot, limited to breakdown of skin ([place])   2. Diabetic polyneuropathy associated with type 2 diabetes mellitus ([place])   3. Pes planus of both feet   4. Gastrocnemius equinus of left lower extremity   5. Uncontrolled type 2 diabetes mellitus with hyperglycemia ([place])       Plan:  Patient was evaluated and treated and all questions answered.  Patient educated on diabetes. Discussed proper diabetic foot care and discussed risks and complications of disease. Educated patient in depth on reasons to return to the office immediately should he/she discover anything concerning or new on the feet. All questions answered. Discussed proper shoes as well.   -Dressing applied consisting of Medihoney and adhesive bandage -Continue mupirocin and bandage at home -Wound cleansed and debrided  Procedure: Selective Debridement of Wound Rationale: Removal of devitalized tissue from the wound to promote healing.  Pre-Debridement Wound Measurements: 0.2 x 0.1 x 0.1 cm Post-Debridement Wound Measurements: same as pre-debridement. Type of Debridement: sharp  selective Tissue Removed: Devitalized soft-tissue Dressing: Dry, sterile, compression dressing. Disposition: Patient tolerated procedure well.   Return in about 4 weeks (around 04/28/2020) for wound re-check.

## 2020-04-08 ENCOUNTER — Other Ambulatory Visit: Payer: Self-pay

## 2020-04-08 ENCOUNTER — Emergency Department (HOSPITAL_BASED_OUTPATIENT_CLINIC_OR_DEPARTMENT_OTHER)
Admission: EM | Admit: 2020-04-08 | Discharge: 2020-04-08 | Disposition: A | Discharge status: Home / Self Care | Payer: 59 | Attending: Emergency Medicine | Admitting: Emergency Medicine

## 2020-04-08 ENCOUNTER — Encounter (HOSPITAL_BASED_OUTPATIENT_CLINIC_OR_DEPARTMENT_OTHER): Payer: Self-pay | Admitting: *Deleted

## 2020-04-08 ENCOUNTER — Emergency Department (HOSPITAL_BASED_OUTPATIENT_CLINIC_OR_DEPARTMENT_OTHER): Payer: 59

## 2020-04-08 DIAGNOSIS — E11621 Type 2 diabetes mellitus with foot ulcer: Secondary | ICD-10-CM | POA: Insufficient documentation

## 2020-04-08 DIAGNOSIS — R0602 Shortness of breath: Secondary | ICD-10-CM | POA: Insufficient documentation

## 2020-04-08 DIAGNOSIS — R0789 Other chest pain: Secondary | ICD-10-CM

## 2020-04-08 DIAGNOSIS — Z7984 Long term (current) use of oral hypoglycemic drugs: Secondary | ICD-10-CM | POA: Diagnosis not present

## 2020-04-08 DIAGNOSIS — L97429 Non-pressure chronic ulcer of left heel and midfoot with unspecified severity: Secondary | ICD-10-CM | POA: Diagnosis not present

## 2020-04-08 DIAGNOSIS — M25512 Pain in left shoulder: Secondary | ICD-10-CM | POA: Diagnosis not present

## 2020-04-08 DIAGNOSIS — E1169 Type 2 diabetes mellitus with other specified complication: Secondary | ICD-10-CM | POA: Insufficient documentation

## 2020-04-08 DIAGNOSIS — Z87891 Personal history of nicotine dependence: Secondary | ICD-10-CM | POA: Diagnosis not present

## 2020-04-08 DIAGNOSIS — Z79899 Other long term (current) drug therapy: Secondary | ICD-10-CM | POA: Diagnosis not present

## 2020-04-08 DIAGNOSIS — Z7982 Long term (current) use of aspirin: Secondary | ICD-10-CM | POA: Insufficient documentation

## 2020-04-08 DIAGNOSIS — I10 Essential (primary) hypertension: Secondary | ICD-10-CM | POA: Insufficient documentation

## 2020-04-08 DIAGNOSIS — E785 Hyperlipidemia, unspecified: Secondary | ICD-10-CM | POA: Diagnosis not present

## 2020-04-08 LAB — CBC
HCT: 37.9 % — ABNORMAL LOW (ref 39.0–52.0)
Hemoglobin: 12.6 g/dL — ABNORMAL LOW (ref 13.0–17.0)
MCH: 26.3 pg (ref 26.0–34.0)
MCHC: 33.2 g/dL (ref 30.0–36.0)
MCV: 79 fL — ABNORMAL LOW (ref 80.0–100.0)
Platelets: 305 10*3/uL (ref 150–400)
RBC: 4.8 MIL/uL (ref 4.22–5.81)
RDW: 13.5 % (ref 11.5–15.5)
WBC: 11.8 10*3/uL — ABNORMAL HIGH (ref 4.0–10.5)
nRBC: 0 % (ref 0.0–0.2)

## 2020-04-08 LAB — BASIC METABOLIC PANEL
Anion gap: 11 (ref 5–15)
BUN: 14 mg/dL (ref 6–20)
CO2: 26 mmol/L (ref 22–32)
Calcium: 9.2 mg/dL (ref 8.9–10.3)
Chloride: 98 mmol/L (ref 98–111)
Creatinine, Ser: 0.88 mg/dL (ref 0.61–1.24)
GFR, Estimated: >60 mL/min (ref >60)
Glucose, Bld: 236 mg/dL — ABNORMAL HIGH (ref 70–99)
Potassium: 3.3 mmol/L — ABNORMAL LOW (ref 3.5–5.1)
Sodium: 135 mmol/L (ref 135–145)

## 2020-04-08 LAB — BRAIN NATRIURETIC PEPTIDE: B Natriuretic Peptide: 26.1 pg/mL (ref 0.0–100.0)

## 2020-04-08 LAB — D-DIMER, QUANTITATIVE: D-Dimer, Quant: <0.27 ug/mL-FEU (ref 0.00–0.50)

## 2020-04-08 LAB — TROPONIN I (HIGH SENSITIVITY)
Troponin I (High Sensitivity): 4 ng/L (ref <18)
Troponin I (High Sensitivity): 4 ng/L (ref <18)

## 2020-04-08 MED ORDER — NAPROXEN 500 MG PO TABS: 500.0000 mg | ORAL_TABLET | Freq: Two times a day (BID) | ORAL | 0 refills | Status: AC

## 2020-04-08 MED FILL — NAPROXEN 500 MG TABS: 500 | 7 days supply | Qty: 14 | Fill #0

## 2020-04-08 NOTE — ED Triage Notes (Signed)
Left sided chest pain, left arm pain, and sob since yesterday. He was seen at [facility] this and told to come here.

## 2020-04-08 NOTE — Discharge Instructions (Signed)
You may alternate taking Tylenol and Naproxen as needed for pain control. You may take Naproxen twice daily as directed on your discharge paperwork and you may take  [id number] mg of Tylenol every 6 hours. Do not exceed 4000 mg of Tylenol daily as this can lead to liver damage. Also, make sure to take Naproxen with meals as it can cause an upset stomach. Do not take other NSAIDs while taking Naproxen such as (Aleve, Ibuprofen, Aspirin, Celebrex, etc) and do not take more than the prescribed dose as this can lead to ulcers and bleeding in your GI tract. You may use warm and cold compresses to help with your symptoms.   Please follow up with your primary doctor within the next 7-10 days for re-evaluation and further treatment of your symptoms.   Please return to the ER sooner if you have any new or worsening symptoms.

## 2020-04-08 NOTE — ED Notes (Signed)
Review D/C papers with pt, pt states understanding, pt denies questions at this time.

## 2020-04-08 NOTE — ED Notes (Signed)
Pt on monitor and vitals cycling 

## 2020-04-08 NOTE — ED Provider Notes (Signed)
[facility] EMERGENCY DEPARTMENT Provider Note   CSN: [id number] Arrival date & time: 04/08/20  1333     History Chief Complaint  [name] presents with   Chest Pain    [name] is a 39 y.o. male.  HPI  HPI: A 39 year old [name] with a history of treated diabetes, hypertension, hypercholesterolemia and obesity presents for evaluation of chest pain. Initial onset of pain was more than 6 hours ago. The [name]'s chest pain is sharp and is not worse with exertion. The [name]'s chest pain is middle- or left-sided, is not well-localized, is not described as heaviness/pressure/tightness and does radiate to the arms/jaw/neck. The [name] does not complain of nausea and denies diaphoresis. The [name] has smoked in the past 90 days. The [name] has no history of stroke, has no history of peripheral artery disease and has no relevant family history of coronary artery disease (first degree relative at less than age 68).  Pt is a 39 y/o male with ah /o allergies, anemia, DM, depression/anxiety, GERD, HLD, HTN, PTSD, scoliosis, sleep apnea, who presents to the ED today for eval of chest pain that started yesterday around 4pm. Describes pain is shart and constant in nature. Rates pain 7/10.  Pain located to the left side of the chest and radiates to the left shoulder and neck. He reports some associated shortness of breath and intermittent pleuritic pain.    He states he has an intermittent cough with his allergies that is no worse than normal.   Denies leg pain/swelling, hemoptysis, recent surgery/trauma, recent long travel, hormone use, personal hx of cancer, or hx of DVT/PE.   Reports chronic BLE edema that is not worse than normal. Is on fluid pill and has not missed dose.    His biological father died at 40 but he is not sure why. He other denies any known early fam hx of CAD.   Has had stress tests in the past and they have been normal.   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 2 diabetes mellitus ([place]) 02/07/2020   Morbid obesity, unspecified obesity type ([place]) 01/10/2020   Hypertension associated with diabetes ([place]) 04/17/2018   Dyslipidemia 04/17/2018   PTSD (post-traumatic stress disorder) 08/11/2016   Spinal stenosis, lumbar region with neurogenic claudication 05/05/2016   Dysthymia 09/02/2015   Chronic low back pain 09/02/2015   Obesity, diabetes, and hypertension syndrome ([place]) 03/11/2008   UNSPECIFIED ANEMIA 03/11/2008    Past Surgical History:  Procedure Laterality Date   LUMBAR LAMINECTOMY/DECOMPRESSION MICRODISCECTOMY Left 05/05/2016   Procedure: CENTRAL DECOMPRESSION L4-L5 AND FORAMINOTOMY FOR L4 ROOT AND L5 ROOT ON THE LEFT;  Surgeon: Latanya Maudlin, MD;  Location: [facility];  Service: Orthopedics;  Laterality: Left;   NO PAST SURGERIES         Family History  Problem Relation Age of Onset   Stroke Maternal Grandmother    Colon cancer Neg Hx    Esophageal  cancer Neg Hx    Rectal cancer Neg Hx    Stomach cancer Neg Hx     Social History   Tobacco Use   Smoking status: Former Smoker    Types: Cigars   Smokeless tobacco: Current User    Types: Snuff, Chew  Vaping Use   Vaping Use: Never used  Substance Use Topics   Alcohol use: Yes    Alcohol/week: 5.0 standard drinks    Types: 5 Shots of liquor per week    Comment: few times year   Drug use: No    Home Medications Prior to Admission medications   Medication Sig Start Date End Date Taking?  Authorizing Provider  acetaminophen (TYLENOL) 500 MG tablet Take 1,000 mg by mouth every 6 (six) hours as needed for mild pain.    [provider]  amitriptyline (ELAVIL) 25 MG tablet Take 25 mg by mouth at bedtime. 09/26/19   [provider]  amLODipine (NORVASC) 10 MG tablet Take 1 tablet (10 mg total) by mouth daily. 02/07/20 05/07/20  Horald Pollen, MD  aspirin EC 81 MG tablet Take 1 tablet (81 mg total) by mouth daily. 07/11/14   Shawnee Knapp, MD  blood glucose meter kit and supplies Dispense based on [name] and insurance preference. Use up to four times daily as directed. (FOR ICD-10 E10.9, E11.9). 01/16/19   Eugenie Filler, MD  doxycycline (VIBRAMYCIN) 100 MG capsule Take 1 capsule (100 mg total) by mouth 2 (two) times daily. 03/07/20   McDonald, Stephan Minister, DPM  doxylamine, Sleep, (UNISOM) 25 MG tablet Take 25 mg by mouth at bedtime as needed.    [provider]  Dulaglutide (TRULICITY) 1.5 IR/6.7EL SOPN Inject 1.5 mg into the skin once a week. 02/07/20   Horald Pollen, MD  FLUoxetine (PROZAC) 10 MG tablet     [provider]  FLUoxetine (PROZAC) 20 MG capsule Take 20 mg by mouth daily. 12/20/19   [provider]  glipiZIDE (GLUCOTROL) 10 MG tablet Take 1 tablet (10 mg total) by mouth 2 (two) times daily before a meal. 02/07/20 05/07/20  Sagardia, Ines Bloomer, MD  ibuprofen (ADVIL) 800 MG tablet Take 1 tablet (800 mg total) by mouth 3 (three) times daily. 10/05/19   Raylene Everts, MD  loratadine (CLARITIN) 10 MG tablet Take 10 mg by mouth as needed for allergies.    [provider]  losartan-hydrochlorothiazide (HYZAAR) 100-12.5 MG tablet Take 1 tablet by mouth daily. 02/07/20   Horald Pollen, MD  metFORMIN (GLUCOPHAGE-XR) 500 MG 24 hr tablet TAKE 4 TABLETS BY MOUTH DAILY WITH SUPPER. 09/23/16   [provider]  mupirocin cream (BACTROBAN) 2 % Apply 1 application topically 2 (two) times daily. 02/27/20   Horton,  Barbette Hair, MD  mupirocin ointment (BACTROBAN) 2 % Apply 1 application topically 3 (three) times daily. 02/27/20   [provider]  nitroGLYCERIN (NITROSTAT) 0.4 MG SL tablet Place 1 tablet (0.4 mg total) under the tongue every 5 (five) minutes as needed for chest pain. 01/16/19   Eugenie Filler, MD  ranitidine (ZANTAC) 150 MG tablet Take by mouth. 09/11/14   [provider]  rosuvastatin (CRESTOR) 20 MG tablet Take 1 tablet (20 mg total) by mouth daily. 02/07/20 05/07/20  Horald Pollen, MD  traZODone (DESYREL) 100 MG tablet TAKE 1 TABLET BY MOUTH EVERY DAY AT BEDTIME AS NEEDED FOR SLEEP 12/10/19   [provider]  zolpidem (AMBIEN) 10 MG tablet Take 10 mg  by mouth at bedtime as needed. 11/19/19   [provider]    Allergies    Amoxicillin, Penicillins, Ambien [zolpidem], Metformin and related, and Other  Review of Systems   Review of Systems  Constitutional: Negative for chills and fever.  HENT: Negative for ear pain and sore throat.   Eyes: Negative for pain and visual disturbance.  Respiratory: Positive for cough and shortness of breath.   Cardiovascular: Positive for chest pain and leg swelling.  Gastrointestinal: Negative for abdominal pain, constipation, diarrhea, nausea and vomiting.  Genitourinary: Negative for dysuria and hematuria.  Musculoskeletal: Negative for back pain.  Skin: Negative for rash.  Neurological: Negative for tremors and headaches.  All other systems reviewed and are negative.   Physical Exam Updated Vital Signs BP 128/89    Pulse 91    Temp 98.3 F (36.8 C) (Oral)    Resp 15    Ht 6' 3"  (1.905 m)    Wt (!) 141.3 kg    SpO2 100%    BMI 38.95 kg/m   Physical Exam Vitals and nursing note reviewed.  Constitutional:      Appearance: He is well-developed.  HENT:     Head: Normocephalic and atraumatic.  Eyes:     Conjunctiva/sclera: Conjunctivae normal.  Cardiovascular:     Rate and Rhythm: Normal rate and regular  rhythm.     Heart sounds: Normal heart sounds. No murmur heard.   Pulmonary:     Effort: Pulmonary effort is normal. No respiratory distress.     Breath sounds: Normal breath sounds. No decreased breath sounds, wheezing, rhonchi or rales.  Chest:     Chest wall: No tenderness.  Abdominal:     Palpations: Abdomen is soft.     Tenderness: There is no abdominal tenderness.  Musculoskeletal:     Cervical back: Neck supple.     Comments: Trace edema  Skin:    General: Skin is warm and dry.  Neurological:     Mental Status: He is alert.     ED Results / Procedures / Treatments   Labs (all labs ordered are listed, but only abnormal results are displayed) Labs Reviewed  BASIC METABOLIC PANEL - Abnormal; Notable for the following components:      Result Value   Potassium 3.3 (*)    Glucose, Bld 236 (*)    All other components within normal limits  CBC - Abnormal; Notable for the following components:   WBC 11.8 (*)    Hemoglobin 12.6 (*)    HCT 37.9 (*)    MCV 79.0 (*)    All other components within normal limits  D-DIMER, QUANTITATIVE (NOT AT [facility])  BRAIN NATRIURETIC PEPTIDE  TROPONIN I (HIGH SENSITIVITY)  TROPONIN I (HIGH SENSITIVITY)    EKG EKG Interpretation  Date/Time:  Tuesday April 08 2020 13:39:51 EST Ventricular Rate:  108 PR Interval:  156 QRS Duration: 80 QT Interval:  346 QTC Calculation: 463 R Axis:   35 Text Interpretation: Sinus tachycardia Otherwise normal ECG Confirmed by [name] [id number]) on 04/08/2020 2:11:50 PM   Radiology DG Chest 2 View  Result Date: 04/08/2020 CLINICAL DATA:  Left-sided chest pain shortness of breath since yesterday. EXAM: CHEST - 2 VIEW COMPARISON:  03/07/2019; 01/15/2019; 10/23/2015 FINDINGS: Grossly unchanged cardiac silhouette and mediastinal contours with nodular prominence of the bilateral pulmonary hila, similar to remote examination performed in 2017 and thus favored to represent prominence of the central  pulmonary vasculature. No focal parenchymal opacities. No pleural effusion  or pneumothorax. No evidence of edema. No acute osseous abnormalities. IMPRESSION: No acute cardiopulmonary disease. Electronically Signed   By: Sandi Mariscal M.D.   On: 04/08/2020 14:33    Procedures Procedures (including critical care time)  Medications Ordered in ED Medications - No data to display  ED Course  I have reviewed the triage vital signs and the nursing notes.  Pertinent labs & imaging results that were available during my care of the [name] were reviewed by me and considered in my medical decision making (see chart for details).    MDM Rules/Calculators/A&P HEAR Score: 2                       39 year old male presenting for evaluation of chest pain.  Reviewed/interpreted lab CBC, CMP, D-dimer, BNP and troponins are all negative.  EKG nonischemic but does show some sinus tachycardia.  Chest x-ray does not show any evidence of pneumonia, pneumothorax. Heart score 2. Low suspicion for ACS, PE, dissection or other emergent abnormality at this time.  [name]'s work-up very reassuring.  Suspect this could be related to MSK cause versus pleurisy.  Will treat with anti-inflammatories and have him follow-up closely with PCP.  Advised on follow-up and return precautions.  He voiced understanding plan reasons to return.  All questions answered.  [name] stable for discharge.   Final Clinical Impression(s) / ED Diagnoses Final diagnoses:  None    Rx / DC Orders ED Discharge Orders    None       Rodney Booze, PA-C 04/08/20 [place], [place], DO 04/08/20 1737

## 2020-04-11 ENCOUNTER — Other Ambulatory Visit: Payer: Self-pay

## 2020-04-11 ENCOUNTER — Emergency Department: Payer: 59

## 2020-04-11 ENCOUNTER — Encounter: Payer: Self-pay | Admitting: Emergency Medicine

## 2020-04-11 DIAGNOSIS — Z5321 Procedure and treatment not carried out due to patient leaving prior to being seen by health care provider: Secondary | ICD-10-CM | POA: Insufficient documentation

## 2020-04-11 DIAGNOSIS — R0602 Shortness of breath: Secondary | ICD-10-CM | POA: Insufficient documentation

## 2020-04-11 DIAGNOSIS — R079 Chest pain, unspecified: Secondary | ICD-10-CM | POA: Insufficient documentation

## 2020-04-11 LAB — TROPONIN I (HIGH SENSITIVITY)
Troponin I (High Sensitivity): 6 ng/L (ref <18)
Troponin I (High Sensitivity): 7 ng/L (ref <18)

## 2020-04-11 LAB — BASIC METABOLIC PANEL WITH GFR
Anion gap: 15 (ref 5–15)
BUN: 22 mg/dL — ABNORMAL HIGH (ref 6–20)
CO2: 25 mmol/L (ref 22–32)
Calcium: 9.9 mg/dL (ref 8.9–10.3)
Chloride: 99 mmol/L (ref 98–111)
Creatinine, Ser: 1.02 mg/dL (ref 0.61–1.24)
GFR, Estimated: >60 mL/min
Glucose, Bld: 379 mg/dL — ABNORMAL HIGH (ref 70–99)
Potassium: 3.4 mmol/L — ABNORMAL LOW (ref 3.5–5.1)
Sodium: 139 mmol/L (ref 135–145)

## 2020-04-11 LAB — CBC
HCT: 39 % (ref 39.0–52.0)
Hemoglobin: 13.1 g/dL (ref 13.0–17.0)
MCH: 26.5 pg (ref 26.0–34.0)
MCHC: 33.6 g/dL (ref 30.0–36.0)
MCV: 78.9 fL — ABNORMAL LOW (ref 80.0–100.0)
Platelets: 353 10*3/uL (ref 150–400)
RBC: 4.94 MIL/uL (ref 4.22–5.81)
RDW: 13.4 % (ref 11.5–15.5)
WBC: 11.6 10*3/uL — ABNORMAL HIGH (ref 4.0–10.5)
nRBC: 0 % (ref 0.0–0.2)

## 2020-04-11 NOTE — ED Triage Notes (Signed)
Pt comes into the ED via POV c/o left side chest pain that has been ongoing since  Monday. Pt seen Tuesday and they informed him it could be related to pleurisy. Pt states he has some SHOB with it, but relates it to the pain.  Pt denies any N/V/D.  Pt does not present with diaphoresis and denies any cardiac history.

## 2020-04-11 NOTE — ED Notes (Signed)
Pt sitting in lobby in no acute distress.  

## 2020-04-11 NOTE — ED Notes (Signed)
Pt updated on delay, pt verbalizes understanding.

## 2020-04-12 ENCOUNTER — Encounter (HOSPITAL_BASED_OUTPATIENT_CLINIC_OR_DEPARTMENT_OTHER): Payer: Self-pay | Admitting: *Deleted

## 2020-04-12 ENCOUNTER — Emergency Department (HOSPITAL_BASED_OUTPATIENT_CLINIC_OR_DEPARTMENT_OTHER)
Admission: EM | Admit: 2020-04-12 | Discharge: 2020-04-12 | Disposition: A | Discharge status: Home / Self Care | Payer: 59 | Attending: Emergency Medicine | Admitting: Emergency Medicine

## 2020-04-12 ENCOUNTER — Other Ambulatory Visit: Payer: Self-pay

## 2020-04-12 ENCOUNTER — Emergency Department
Admission: EM | Admit: 2020-04-12 | Discharge: 2020-04-12 | Disposition: A | Discharge status: Home / Self Care | Payer: 59 | Attending: Emergency Medicine | Admitting: Emergency Medicine

## 2020-04-12 DIAGNOSIS — I10 Essential (primary) hypertension: Secondary | ICD-10-CM | POA: Insufficient documentation

## 2020-04-12 DIAGNOSIS — E119 Type 2 diabetes mellitus without complications: Secondary | ICD-10-CM | POA: Insufficient documentation

## 2020-04-12 DIAGNOSIS — R0789 Other chest pain: Secondary | ICD-10-CM | POA: Diagnosis not present

## 2020-04-12 DIAGNOSIS — Z87891 Personal history of nicotine dependence: Secondary | ICD-10-CM | POA: Diagnosis not present

## 2020-04-12 DIAGNOSIS — M542 Cervicalgia: Secondary | ICD-10-CM

## 2020-04-12 DIAGNOSIS — M25512 Pain in left shoulder: Secondary | ICD-10-CM | POA: Diagnosis not present

## 2020-04-12 DIAGNOSIS — Z7982 Long term (current) use of aspirin: Secondary | ICD-10-CM | POA: Diagnosis not present

## 2020-04-12 DIAGNOSIS — R202 Paresthesia of skin: Secondary | ICD-10-CM

## 2020-04-12 DIAGNOSIS — Z7984 Long term (current) use of oral hypoglycemic drugs: Secondary | ICD-10-CM | POA: Insufficient documentation

## 2020-04-12 DIAGNOSIS — M79602 Pain in left arm: Secondary | ICD-10-CM

## 2020-04-12 DIAGNOSIS — Z79899 Other long term (current) drug therapy: Secondary | ICD-10-CM | POA: Insufficient documentation

## 2020-04-12 MED ORDER — LIDOCAINE 5 % EX PTCH: 1.0000 | MEDICATED_PATCH | CUTANEOUS | 0 refills | Status: DC

## 2020-04-12 MED ORDER — HYDROCODONE-ACETAMINOPHEN 5-325 MG PO TABS
1.0000 | ORAL_TABLET | Freq: Four times a day (QID) | ORAL | 0 refills | Status: DC | PRN
Start: 2020-04-12 — End: 2020-09-18

## 2020-04-12 MED ORDER — GABAPENTIN 300 MG PO CAPS: 300.0000 mg | ORAL_CAPSULE | Freq: Three times a day (TID) | ORAL | 0 refills | Status: DC

## 2020-04-12 MED ORDER — METHOCARBAMOL 750 MG PO TABS: 750.0000 mg | ORAL_TABLET | Freq: Two times a day (BID) | ORAL | 0 refills | Status: DC | PRN

## 2020-04-12 NOTE — ED Provider Notes (Signed)
[facility] EMERGENCY DEPARTMENT Provider Note   CSN: [id number] Arrival date & time: 04/12/20  1415     History Chief Complaint  Patient presents with  . Arm Pain    SARVESH MEDDAUGH is a 39 y.o. male.  HPI      DOMONICK SITTNER is a 39 y.o. male, with a history of HTN, hyperlipidemia, DM, presenting to the ED with pain to left arm, left upper chest, left neck beginning about 6 days ago. Patient states he woke up from a hard sleep and noticed his son (about 40 pounds) was lying on his left shoulder.  Shortly thereafter, he began to have pain left shoulder and left upper chest, started as a soreness, then became sharp.  He states he has since been evaluated twice with chest pain work-ups, both negative.  He has also been experiencing pain to the left posterior neck, described as a tightness and soreness, radiating into the left shoulder and left arm.  Also notes paresthesias into the left ring and little finger.  Denies fever, cough, shortness of breath, numbness, weakness, headaches, vision changes, or any other complaints.    Past Medical History:  Diagnosis Date  . Allergies   . Allergy   . Anemia   . Anxiety   . Depression   . Diabetes mellitus   . GERD (gastroesophageal reflux disease)   . Hyperlipidemia   . Hypertension   . Lumbar disc herniation   . 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 PTSD (post-traumatic stress disorder)   . Scoliosis   . Sleep apnea     Patient Active Problem List   Diagnosis Date Noted  . 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 Morbid obesity, unspecified obesity type ([place]) 01/10/2020  . Hypertension associated with diabetes ([place]) 04/17/2018  . Dyslipidemia 04/17/2018  . PTSD (post-traumatic stress disorder) 08/11/2016  . Spinal stenosis, lumbar region with neurogenic claudication 05/05/2016  . Dysthymia 09/02/2015  . Chronic low back pain  09/02/2015  . Obesity, diabetes, and hypertension syndrome ([place]) 03/11/2008  . UNSPECIFIED ANEMIA 03/11/2008    Past Surgical History:  Procedure Laterality Date  . LUMBAR LAMINECTOMY/DECOMPRESSION MICRODISCECTOMY Left 05/05/2016   Procedure: CENTRAL DECOMPRESSION L4-L5 AND FORAMINOTOMY FOR L4 ROOT AND L5 ROOT ON THE LEFT;  Surgeon: Latanya Maudlin, MD;  Location: [facility];  Service: Orthopedics;  Laterality: Left;  . NO PAST SURGERIES         Family History  Problem Relation Age of Onset  . Stroke Maternal Grandmother   . Colon cancer Neg Hx   . Esophageal cancer Neg Hx   . Rectal cancer Neg Hx   . Stomach cancer Neg Hx     Social History   Tobacco Use  . Smoking status: Former Smoker    Types: Cigars  . Smokeless tobacco: Current User    Types: Snuff  Vaping Use  . Vaping Use: Never used  Substance Use Topics  . Alcohol use: Yes    Alcohol/week: 5.0 standard drinks    Types: 5 Shots of liquor per week    Comment: few times year  . Drug use: No    Home Medications Prior to Admission medications   Medication Sig Start Date End Date Taking? Authorizing Provider  acetaminophen (TYLENOL) 500 MG tablet Take 1,000 mg by mouth every 6 (six) hours as needed for mild pain.    [provider]  amitriptyline (ELAVIL) 25 MG tablet Take 25 mg by mouth at bedtime. 09/26/19   [provider]  amLODipine (NORVASC) 10 MG tablet Take 1 tablet (10 mg total) by mouth daily. 02/07/20 05/07/20  Horald Pollen, MD  aspirin EC 81 MG tablet Take 1 tablet (81 mg total) by mouth daily. 07/11/14   Shawnee Knapp, MD  blood glucose meter kit and supplies Dispense based on patient and insurance preference. Use up to four times daily as directed. (FOR ICD-10 E10.9, E11.9). 01/16/19   Eugenie Filler, MD  doxycycline (VIBRAMYCIN) 100 MG capsule Take 1 capsule (100 mg total) by mouth 2 (two) times daily. 03/07/20   McDonald, Stephan Minister, DPM  doxylamine, Sleep, (UNISOM) 25 MG tablet Take  25 mg by mouth at bedtime as needed.    [provider]  Dulaglutide (TRULICITY) 1.5 IN/8.6VE SOPN Inject 1.5 mg into the skin once a week. 02/07/20   Horald Pollen, MD  FLUoxetine (PROZAC) 10 MG tablet     [provider]  FLUoxetine (PROZAC) 20 MG capsule Take 20 mg by mouth daily. 12/20/19   [provider]  gabapentin (NEURONTIN) 300 MG capsule Take 1 capsule (300 mg total) by mouth 3 (three) times daily for 10 days. 04/12/20 04/22/20  Jayant Kriz C, PA-C  glipiZIDE (GLUCOTROL) 10 MG tablet Take 1 tablet (10 mg total) by mouth 2 (two) times daily before a meal. 02/07/20 05/07/20  Sagardia, Ines Bloomer, MD  HYDROcodone-acetaminophen (NORCO/VICODIN) 5-325 MG tablet Take 1 tablet by mouth every 6 (six) hours as needed for severe pain. 04/12/20   Andrik Sandt C, PA-C  ibuprofen (ADVIL) 800 MG tablet Take 1 tablet (800 mg total) by mouth 3 (three) times daily. 10/05/19   Raylene Everts, MD  lidocaine (LIDODERM) 5 % Place 1 patch onto the skin daily. Remove & Discard patch within 12 hours or as directed by MD 04/12/20   Thoren Hosang C, PA-C  loratadine (CLARITIN) 10 MG tablet Take 10 mg by mouth as needed for allergies.    [provider]  losartan-hydrochlorothiazide (HYZAAR) 100-12.5 MG tablet Take 1 tablet by mouth daily. 02/07/20   Horald Pollen, MD  metFORMIN (GLUCOPHAGE-XR) 500 MG 24 hr tablet TAKE 4 TABLETS BY MOUTH DAILY WITH SUPPER. 09/23/16   [provider]  methocarbamol (ROBAXIN) 750 MG tablet Take 1 tablet (750 mg total) by mouth 2 (two) times daily as needed for muscle spasms (or muscle tightness). 04/12/20   Marciano Mundt C, PA-C  mupirocin cream (BACTROBAN) 2 % Apply 1 application topically 2 (two) times daily. 02/27/20   Horton, Barbette Hair, MD  mupirocin ointment (BACTROBAN) 2 % Apply 1 application topically 3 (three) times daily. 02/27/20   [provider]  naproxen (NAPROSYN) 500 MG tablet Take 1 tablet (500 mg total) by mouth 2  (two) times daily for 7 days. 04/08/20 04/15/20  Couture, Cortni S, PA-C  nitroGLYCERIN (NITROSTAT) 0.4 MG SL tablet Place 1 tablet (0.4 mg total) under the tongue every 5 (five) minutes as needed for chest pain. 01/16/19   Eugenie Filler, MD  ranitidine (ZANTAC) 150 MG tablet Take by mouth. 09/11/14   [provider]  rosuvastatin (CRESTOR) 20 MG tablet Take 1 tablet (20 mg total) by mouth daily. 02/07/20 05/07/20  Horald Pollen, MD  traZODone (DESYREL) 100 MG tablet TAKE 1 TABLET BY MOUTH EVERY DAY AT BEDTIME AS NEEDED FOR SLEEP 12/10/19   [provider]  zolpidem (AMBIEN) 10 MG tablet  Take 10 mg by mouth at bedtime as needed. 11/19/19   [provider]    Allergies    Amoxicillin, Penicillins, Ambien [zolpidem], Metformin and related, and Other  Review of Systems   Review of Systems  Constitutional: Negative for chills, diaphoresis and fever.  Respiratory: Negative for cough and shortness of breath.   Cardiovascular: Negative for leg swelling.  Gastrointestinal: Negative for abdominal pain, nausea and vomiting.  Musculoskeletal: Positive for arthralgias and neck pain. Negative for back pain.  Neurological: Negative for dizziness, syncope, weakness, numbness and headaches.  All other systems reviewed and are negative.   Physical Exam Updated Vital Signs BP 114/84 (BP Location: Right Arm)   Pulse (!) 101   Temp 98.4 F (36.9 C) (Oral)   Resp 16   Ht 6' 3"  (1.905 m)   Wt (!) 136.8 kg   SpO2 100%   BMI 37.70 kg/m   Physical Exam Vitals and nursing note reviewed.  Constitutional:      General: He is not in acute distress.    Appearance: He is well-developed. He is not diaphoretic.  HENT:     Head: Normocephalic and atraumatic.     Mouth/Throat:     Mouth: Mucous membranes are moist.     Pharynx: Oropharynx is clear.  Eyes:     Conjunctiva/sclera: Conjunctivae normal.  Neck:     Comments: No tenderness over any other area of the neck,  including over the carotids. Cardiovascular:     Rate and Rhythm: Normal rate and regular rhythm.     Pulses: Normal pulses.          Radial pulses are 2+ on the right side and 2+ on the left side.     Heart sounds: Normal heart sounds.     Comments: Tactile temperature in the extremities appropriate and equal bilaterally.  Not tachycardic on my exam. Pulmonary:     Effort: Pulmonary effort is normal. No respiratory distress.     Breath sounds: Normal breath sounds.  Chest:     Chest wall: Tenderness present. No deformity, swelling or crepitus.    Abdominal:     Palpations: Abdomen is soft.     Tenderness: There is no abdominal tenderness. There is no guarding.  Musculoskeletal:     Cervical back: Normal range of motion and neck supple.     Right lower leg: No edema.     Left lower leg: No edema.     Comments: Tenderness along the left cervical musculature into the left trapezius. Patient states his pain then runs to the left ulnar elbow and extends toward the hand. Full range of motion in the left wrist and elbow.  Range of motion in the left shoulder is limited by pain, but mostly intact.  Lymphadenopathy:     Cervical: No cervical adenopathy.  Skin:    General: Skin is warm and dry.     Capillary Refill: Capillary refill takes less than 2 seconds.  Neurological:     Mental Status: He is alert.     Comments: Sensation grossly intact to light touch through each of the nerve distributions of the bilateral upper extremities. Abduction and adduction of the fingers intact against resistance. Grip strength equal bilaterally. Supination and pronation intact against resistance. Strength 5/5 through the cardinal directions of the bilateral wrists. Strength 5/5 with flexion and extension of the bilateral elbows. Patient can touch the thumb to each one of the fingertips without difficulty.  Patient can hold the "OK"  sign against resistance.  Psychiatric:        Mood and Affect: Mood  and affect normal.        Speech: Speech normal.        Behavior: Behavior normal.     ED Results / Procedures / Treatments   Labs (all labs ordered are listed, but only abnormal results are displayed) Labs Reviewed - No data to display  EKG None  Radiology DG Chest 2 View  Result Date: 04/11/2020 CLINICAL DATA:  Chest pain. EXAM: CHEST - 2 VIEW COMPARISON:  April 08, 2020 FINDINGS: The heart size and mediastinal contours are within normal limits. Both lungs are clear. The visualized skeletal structures are unremarkable. IMPRESSION: No active cardiopulmonary disease. Electronically Signed   By: Dorise Bullion III M.D   On: 04/11/2020 19:53    Procedures Procedures (including critical care time)  Medications Ordered in ED Medications - No data to display  ED Course  I have reviewed the triage vital signs and the nursing notes.  Pertinent labs & imaging results that were available during my care of the patient were reviewed by me and considered in my medical decision making (see chart for details).    MDM Rules/Calculators/A&P                          Patient presents with pain at the neck, left shoulder, extending into the left arm and hand.  No focal neurologic deficits noted.  No evidence of vascular compromise. Symptoms do seem to suggest radiculopathy. In review of the patient's chart, I noted on previous recent work-up the patient had a negative D-dimer as well as several negative troponins. He seems to have higher than normal blood glucose levels, therefore we wanted to avoid prednisone or other steroids. We will have the patient follow-up with orthopedics. The patient was given instructions for home care as well as return precautions. Patient voices understanding of these instructions, accepts the plan, and is comfortable with discharge.    Final Clinical Impression(s) / ED Diagnoses Final diagnoses:  Neck pain on left side  Left arm pain  Paresthesia     Rx / DC Orders ED Discharge Orders         Ordered    gabapentin (NEURONTIN) 300 MG capsule  3 times daily        04/12/20 1827    methocarbamol (ROBAXIN) 750 MG tablet  2 times daily PRN        04/12/20 1827    lidocaine (LIDODERM) 5 %  Every 24 hours        04/12/20 1827    HYDROcodone-acetaminophen (NORCO/VICODIN) 5-325 MG tablet  Every 6 hours PRN        04/12/20 1827           Lorayne Bender, PA-C 04/13/20 0008    Isla Pence, MD 04/13/20 1557

## 2020-04-12 NOTE — ED Notes (Signed)
Explanation of delay provided to pt who verbalizes understanding.

## 2020-04-12 NOTE — ED Triage Notes (Addendum)
Pt states he was seen here Tuesday and at [facility] last night for chest pain/sob/arm pain and had cardiac workup. Reports he was told to f/u with urgent care- they directed him to come back to ED. Pt c/o pain in neck that radiates down down his left arm. States his arm hurts, tingles, feels numb. Grips strong and equal, no drift, facial symmetry present. Pt reports he is also having pain in his left axilla. Reports pain when he turns his head. Pt reports he has had cardiac workup already this week x 2 and he does not wish to repeat those tests

## 2020-04-12 NOTE — Discharge Instructions (Addendum)
  Antiinflammatory medications: Take 600 mg of ibuprofen every 6 hours or 440 mg (over the counter dose) to 500 mg (prescription dose) of naproxen every 12 hours for the next 3 days. After this time, these medications may be used as needed for pain. Take these medications with food to avoid upset stomach. Choose only one of these medications, do not take them together. Acetaminophen (generic for Tylenol): Should you continue to have additional pain while taking the ibuprofen or naproxen, you may add in acetaminophen as needed. Your daily total maximum amount of acetaminophen from all sources should be limited to 4000mg /day for persons without liver problems, or 2000mg /day for those with liver problems. Vicodin: May take Vicodin (hydrocodone-acetaminophen) as needed for severe pain.   Do not drive or perform other dangerous activities while taking this medication as it can cause drowsiness as well as changes in reaction time and judgement.   Please note that each pill of Vicodin contains 325 mg of acetaminophen (generic for Tylenol) and the above dosage limits apply. Gabapentin: May try taking the gabapentin (generic for Neurontin) to help with nerve pain.   Sometimes, this medication can affect different patients in different ways and some side effects that are possible include dizziness, feelings of unsteadiness, drowsiness, among others. Methocarbamol: Methocarbamol (generic for Robaxin) is a muscle relaxer and can help relieve stiff muscles or muscle spasms.  Do not drive or perform other dangerous activities while taking this medication as it can cause drowsiness as well as changes in reaction time and judgement. Lidocaine patches: These are available via either prescription or over-the-counter. The over-the-counter option may be more economical one and are likely just as effective. There are multiple over-the-counter brands, such as Salonpas. Ice: May apply ice to the area over the next 24 hours for 15  minutes at a time to reduce pain, inflammation, and swelling, if present. Follow up: Follow-up with the orthopedic specialist.  Call to make an appointment.  Ideally, we would have you follow-up within the next week or two. Return: Return to the ED should symptoms worsen, you begin to have persistent numbness in the hand or arm, weakness in the extremity, fever with your symptoms, or any other major concerns.  For prescription assistance, may try using prescription discount sites or apps, such as goodrx.com

## 2020-04-12 NOTE — ED Notes (Signed)
Pt states he is leaving and declines to stay any longer. Pt encouraged to return for any concern.

## 2020-04-17 DIAGNOSIS — M542 Cervicalgia: Secondary | ICD-10-CM | POA: Insufficient documentation

## 2020-04-25 ENCOUNTER — Ambulatory Visit: Payer: 59 | Admitting: Podiatry

## 2020-05-12 ENCOUNTER — Ambulatory Visit: Payer: 59 | Admitting: Emergency Medicine

## 2020-05-28 ENCOUNTER — Ambulatory Visit: Payer: 59 | Admitting: Emergency Medicine

## 2020-06-10 ENCOUNTER — Ambulatory Visit: Payer: 59 | Admitting: Emergency Medicine

## 2020-06-10 ENCOUNTER — Other Ambulatory Visit: Payer: Self-pay

## 2020-06-10 ENCOUNTER — Encounter: Payer: Self-pay | Admitting: Emergency Medicine

## 2020-06-10 VITALS — BP 123/79 | HR 106 | Temp 98.2°F | Resp 16 | Ht 75.0 in | Wt 306.0 lb

## 2020-06-10 DIAGNOSIS — E11621 Type 2 diabetes mellitus with foot ulcer: Secondary | ICD-10-CM | POA: Diagnosis not present

## 2020-06-10 DIAGNOSIS — L97421 Non-pressure chronic ulcer of left heel and midfoot limited to breakdown of skin: Secondary | ICD-10-CM

## 2020-06-10 DIAGNOSIS — Z131 Encounter for screening for diabetes mellitus: Secondary | ICD-10-CM

## 2020-06-10 DIAGNOSIS — E1165 Type 2 diabetes mellitus with hyperglycemia: Secondary | ICD-10-CM

## 2020-06-10 DIAGNOSIS — E669 Obesity, unspecified: Secondary | ICD-10-CM

## 2020-06-10 DIAGNOSIS — M5030 Other cervical disc degeneration, unspecified cervical region: Secondary | ICD-10-CM

## 2020-06-10 DIAGNOSIS — I152 Hypertension secondary to endocrine disorders: Secondary | ICD-10-CM | POA: Diagnosis not present

## 2020-06-10 DIAGNOSIS — E1169 Type 2 diabetes mellitus with other specified complication: Secondary | ICD-10-CM | POA: Diagnosis not present

## 2020-06-10 DIAGNOSIS — E1159 Type 2 diabetes mellitus with other circulatory complications: Secondary | ICD-10-CM | POA: Diagnosis not present

## 2020-06-10 DIAGNOSIS — E1142 Type 2 diabetes mellitus with diabetic polyneuropathy: Secondary | ICD-10-CM | POA: Diagnosis not present

## 2020-06-10 DIAGNOSIS — G8929 Other chronic pain: Secondary | ICD-10-CM

## 2020-06-10 DIAGNOSIS — Z6838 Body mass index (BMI) 38.0-38.9, adult: Secondary | ICD-10-CM

## 2020-06-10 DIAGNOSIS — M542 Cervicalgia: Secondary | ICD-10-CM

## 2020-06-10 LAB — POCT GLYCOSYLATED HEMOGLOBIN (HGB A1C): Hemoglobin A1C: 10.3 % — AB (ref 4.0–5.6)

## 2020-06-10 LAB — GLUCOSE, POCT (MANUAL RESULT ENTRY): POC Glucose: 320 mg/dl — AB (ref 70–99)

## 2020-06-10 MED ORDER — CANAGLIFLOZIN 300 MG PO TABS: 300.0000 mg | ORAL_TABLET | Freq: Every day | ORAL | 3 refills | Status: AC

## 2020-06-10 MED ORDER — TRULICITY 3 MG/0.5ML ~~LOC~~ SOAJ: 3.0000 mg | SUBCUTANEOUS | 5 refills | Status: DC

## 2020-06-10 NOTE — Patient Instructions (Addendum)
Continue glipizide 10 mg twice a day. Increase Trulicity to 3 mg weekly. Start Invokana 300 mg daily. Follow-up in 3 months.    If you have lab work done today you will be contacted with your lab results within the next 2 weeks.  If you have not heard from [place] then please contact us. The fastest way to get your results is to register for My Chart.   IF you received an x-ray today, you will receive an invoice from [facility]. Please contact [facility] at [phone] with questions or concerns regarding your invoice.   IF you received labwork today, you will receive an invoice from [facility]. Please contact [facility] at [phone] with questions or concerns regarding your invoice.   Our billing staff will not be able to assist you with questions regarding bills from these companies.  You will be contacted with the lab results as soon as they are available. The fastest way to get your results is to activate your My Chart account. Instructions are located on the last page of this paperwork. If you have not heard from [place] regarding the results in 2 weeks, please contact this office.     Diabetes Mellitus and Nutrition, Adult When you have diabetes, or diabetes mellitus, it is very important to have healthy eating habits because your blood sugar (glucose) levels are greatly affected by what you eat and drink. Eating healthy foods in the right amounts, at about the same times every day, can help you:  Control your blood glucose.  Lower your risk of heart disease.  Improve your blood pressure.  Reach or maintain a healthy weight. What can affect my meal plan? Every person with diabetes is different, and each person has different needs for a meal plan. Your health care provider may recommend that you work with a dietitian to make a meal plan that is best for you. Your meal plan may vary depending on factors such as:  The calories you need.  The medicines you take.  Your  weight.  Your blood glucose, blood pressure, and cholesterol levels.  Your activity level.  Other health conditions you have, such as heart or kidney disease. How do carbohydrates affect me? Carbohydrates, also called carbs, affect your blood glucose level more than any other type of food. Eating carbs naturally raises the amount of glucose in your blood. Carb counting is a method for keeping track of how many carbs you eat. Counting carbs is important to keep your blood glucose at a healthy level, especially if you use insulin or take certain oral diabetes medicines. It is important to know how many carbs you can safely have in each meal. This is different for every person. Your dietitian can help you calculate how many carbs you should have at each meal and for each snack. How does alcohol affect me? Alcohol can cause a sudden decrease in blood glucose (hypoglycemia), especially if you use insulin or take certain oral diabetes medicines. Hypoglycemia can be a life-threatening condition. Symptoms of hypoglycemia, such as sleepiness, dizziness, and confusion, are similar to symptoms of having too much alcohol.  Do not drink alcohol if: ? Your health care provider tells you not to drink. ? You are pregnant, may be pregnant, or are planning to become pregnant.  If you drink alcohol: ? Do not drink on an empty stomach. ? Limit how much you use to:  0-1 drink a day for women.  0-2 drinks a day for men. ? Be aware  of how much alcohol is in your drink. In the [place] one drink equals one 12 oz bottle of beer (355 mL), one 5 oz glass of wine (148 mL), or one 1 oz glass of hard liquor (44 mL). ? Keep yourself hydrated with water, diet soda, or unsweetened iced tea.  Keep in mind that regular soda, juice, and other mixers may contain a lot of sugar and must be counted as carbs. What are tips for following this plan? Reading food labels  Start by checking the serving size on the "Nutrition Facts"  label of packaged foods and drinks. The amount of calories, carbs, fats, and other nutrients listed on the label is based on one serving of the item. Many items contain more than one serving per package.  Check the total grams (g) of carbs in one serving. You can calculate the number of servings of carbs in one serving by dividing the total carbs by 15. For example, if a food has 30 g of total carbs per serving, it would be equal to 2 servings of carbs.  Check the number of grams (g) of saturated fats and trans fats in one serving. Choose foods that have a low amount or none of these fats.  Check the number of milligrams (mg) of salt (sodium) in one serving. Most people should limit total sodium intake to less than 2,300 mg per day.  Always check the nutrition information of foods labeled as "low-fat" or "nonfat." These foods may be higher in added sugar or refined carbs and should be avoided.  Talk to your dietitian to identify your daily goals for nutrients listed on the label. Shopping  Avoid buying canned, pre-made, or processed foods. These foods tend to be high in fat, sodium, and added sugar.  Shop around the outside edge of the grocery store. This is where you will most often find fresh fruits and vegetables, bulk grains, fresh meats, and fresh dairy. Cooking  Use low-heat cooking methods, such as baking, instead of high-heat cooking methods like deep frying.  Cook using healthy oils, such as olive, canola, or sunflower oil.  Avoid cooking with butter, cream, or high-fat meats. Meal planning  Eat meals and snacks regularly, preferably at the same times every day. Avoid going long periods of time without eating.  Eat foods that are high in fiber, such as fresh fruits, vegetables, beans, and whole grains. Talk with your dietitian about how many servings of carbs you can eat at each meal.  Eat 4-6 oz (112-168 g) of lean protein each day, such as lean meat, chicken, fish, eggs, or  tofu. One ounce (oz) of lean protein is equal to: ? 1 oz (28 g) of meat, chicken, or fish. ? 1 egg. ?  cup (62 g) of tofu.  Eat some foods each day that contain healthy fats, such as avocado, nuts, seeds, and fish.   What foods should I eat? Fruits Berries. Apples. Oranges. Peaches. Apricots. Plums. Grapes. Mango. Papaya. Pomegranate. Kiwi. Cherries. Vegetables Lettuce. Spinach. Leafy greens, including kale, chard, collard greens, and mustard greens. Beets. Cauliflower. Cabbage. Broccoli. Carrots. Green beans. Tomatoes. Peppers. Onions. Cucumbers. Brussels sprouts. Grains Whole grains, such as whole-wheat or whole-grain bread, crackers, tortillas, cereal, and pasta. Unsweetened oatmeal. Quinoa. Brown or wild rice. Meats and other proteins Seafood. Poultry without skin. Lean cuts of poultry and beef. Tofu. Nuts. Seeds. Dairy Low-fat or fat-free dairy products such as milk, yogurt, and cheese. The items listed above may not be a complete  list of foods and beverages you can eat. Contact a dietitian for more information. What foods should I avoid? Fruits Fruits canned with syrup. Vegetables Canned vegetables. Frozen vegetables with butter or cream sauce. Grains Refined white flour and flour products such as bread, pasta, snack foods, and cereals. Avoid all processed foods. Meats and other proteins Fatty cuts of meat. Poultry with skin. Breaded or fried meats. Processed meat. Avoid saturated fats. Dairy Full-fat yogurt, cheese, or milk. Beverages Sweetened drinks, such as soda or iced tea. The items listed above may not be a complete list of foods and beverages you should avoid. Contact a dietitian for more information. Questions to ask a health care provider  Do I need to meet with a diabetes educator?  Do I need to meet with a dietitian?  What number can I call if I have questions?  When are the best times to check my blood glucose? Where to find more information:  American  Diabetes Association: diabetes.org  Academy of Nutrition and Dietetics: www.eatright.[employer] of Diabetes and Digestive and Kidney Diseases: [employer]  Association of Diabetes Care and Education Specialists: www.diabeteseducator.org Summary  It is important to have healthy eating habits because your blood sugar (glucose) levels are greatly affected by what you eat and drink.  A healthy meal plan will help you control your blood glucose and maintain a healthy lifestyle.  Your health care provider may recommend that you work with a dietitian to make a meal plan that is best for you.  Keep in mind that carbohydrates (carbs) and alcohol have immediate effects on your blood glucose levels. It is important to count carbs and to use alcohol carefully. This information is not intended to replace advice given to you by your health care provider. Make sure you discuss any questions you have with your health care provider. Document Revised: 04/03/2019 Document Reviewed: 04/03/2019 Elsevier Patient Education  2021 [employer].

## 2020-06-10 NOTE — Progress Notes (Signed)
[name] 40 y.o.   Chief Complaint  Patient presents with  . Diabetes    Per patient check glucose  . Pre-op Exam    Per patient neck surgery not sure the date     HISTORY OF PRESENT ILLNESS: This is a 40 y.o. male with history of diabetes here for follow-up. A1A. Presently on Trulicity 1.5 mg weekly and glipizide 10 mg twice a day.  Intolerant to Metformin. History of dyslipidemia on rosuvastatin 20 mg daily. Has history of chronic neck pain with recent episodes of left arm paresthesia.  Cervical spine MRI showed significant degenerative disc disease.  Has appointment to see orthopedist next week. Also had abnormal EMG test done recently.  Official results pending. Diabetic foot ulcer much improved.  Under the care of a podiatrist with very good results.  HPI   Prior to Admission medications   Medication Sig Start Date End Date Taking? Authorizing Provider  acetaminophen (TYLENOL) 500 MG tablet Take 1,000 mg by mouth every 6 (six) hours as needed for mild pain.   Yes [provider]  aspirin EC 81 MG tablet Take 1 tablet (81 mg total) by mouth daily. 07/11/14  Yes Shawnee Knapp, MD  doxylamine, Sleep, (UNISOM) 25 MG tablet Take 25 mg by mouth at bedtime as needed.   Yes [provider]  Dulaglutide (TRULICITY) 1.5 DJ/2.4QA SOPN Inject 1.5 mg into the skin once a week. 02/07/20  Yes Galileo Colello, Ines Bloomer, MD  FLUoxetine (PROZAC) 20 MG capsule Take 20 mg by mouth daily. 12/20/19  Yes [provider]  ibuprofen (ADVIL) 800 MG tablet Take 1 tablet (800 mg total) by mouth 3 (three) times daily. 10/05/19  Yes Raylene Everts, MD  loratadine (CLARITIN) 10 MG tablet Take 10 mg by mouth as needed for allergies.   Yes [provider]  losartan-hydrochlorothiazide (HYZAAR) 100-12.5 MG tablet Take 1 tablet by mouth daily. 02/07/20  Yes Winnifred Dufford, Ines Bloomer, MD  mupirocin ointment (BACTROBAN) 2 % Apply 1 application topically 3 (three) times daily. 02/27/20   Yes [provider]  nitroGLYCERIN (NITROSTAT) 0.4 MG SL tablet Place 1 tablet (0.4 mg total) under the tongue every 5 (five) minutes as needed for chest pain. 01/16/19  Yes Eugenie Filler, MD  traZODone (DESYREL) 100 MG tablet TAKE 1 TABLET BY MOUTH EVERY DAY AT BEDTIME AS NEEDED FOR SLEEP 12/10/19  Yes [provider]  amitriptyline (ELAVIL) 25 MG tablet Take 25 mg by mouth at bedtime. 09/26/19   [provider]  amLODipine (NORVASC) 10 MG tablet Take 1 tablet (10 mg total) by mouth daily. 02/07/20 05/07/20  Horald Pollen, MD  blood glucose meter kit and supplies Dispense based on patient and insurance preference. Use up to four times daily as directed. (FOR ICD-10 E10.9, E11.9). 01/16/19   Eugenie Filler, MD  FLUoxetine (PROZAC) 10 MG tablet     [provider]  gabapentin (NEURONTIN) 300 MG capsule Take 1 capsule (300 mg total) by mouth 3 (three) times daily for 10 days. 04/12/20 04/22/20  Joy, Shawn C, PA-C  glipiZIDE (GLUCOTROL) 10 MG tablet Take 1 tablet (10 mg total) by mouth 2 (two) times daily before a meal. 02/07/20 05/07/20  Vanassa Penniman, Ines Bloomer, MD  HYDROcodone-acetaminophen (NORCO/VICODIN) 5-325 MG tablet Take 1 tablet by mouth every 6 (six) hours as needed for severe pain. Patient not taking: Reported on 06/10/2020 04/12/20   Joy, Raquel Sarna C, PA-C  lidocaine (LIDODERM) 5 % Place 1 patch onto the  skin daily. Remove & Discard patch within 12 hours or as directed by MD 04/12/20   Joy, Shawn C, PA-C  metFORMIN (GLUCOPHAGE-XR) 500 MG 24 hr tablet TAKE 4 TABLETS BY MOUTH DAILY WITH SUPPER. 09/23/16   [provider]  methocarbamol (ROBAXIN) 750 MG tablet Take 1 tablet (750 mg total) by mouth 2 (two) times daily as needed for muscle spasms (or muscle tightness). Patient not taking: Reported on 06/10/2020 04/12/20   Lorayne Bender, PA-C  mupirocin cream (BACTROBAN) 2 % Apply 1 application topically 2 (two) times daily. Patient not taking: Reported on  06/10/2020 02/27/20   Horton, Barbette Hair, MD  rosuvastatin (CRESTOR) 20 MG tablet Take 1 tablet (20 mg total) by mouth daily. 02/07/20 05/07/20  Horald Pollen, MD  zolpidem (AMBIEN) 10 MG tablet Take 10 mg by mouth at bedtime as needed. 11/19/19   [provider]    Allergies  Allergen Reactions  . Amoxicillin Anaphylaxis and Other (See Comments)    Has patient had a PCN reaction causing immediate rash, facial/tongue/throat swelling, SOB or lightheadedness with hypotension: yes Has patient had a PCN reaction causing severe rash involving mucus membranes or skin necrosis: no Has patient had a PCN reaction that required hospitalization yes Has patient had a PCN reaction occurring within the last 10 years: yes If all of the above answers are "NO", then may proceed with Cephalosporin use.  [name] Penicillins Anaphylaxis and Other (See Comments)    Patient was told to list this as an allergy because he is allergic to Amoxicillin Has patient had a PCN reaction causing immediate rash, facial/tongue/throat swelling, SOB or lightheadedness with hypotension: yes for Amoxicillin Has patient had a PCN reaction causing severe rash involving mucus membranes or skin necrosis: no Has patient had a PCN reaction that required hospitalization yes for Amoxicillin Has patient had a PCN reaction occurring within the last 10 years: yes for Amoxicillin If all o  . Ambien [Zolpidem] Other (See Comments)    Passed out. And memory loss  . Metformin And Related Other (See Comments)    SEVERE GI UPSET  . Other Other (See Comments)    Powder in some gloves - localized itching but not allergic to latex, benadryl usually helps with this reaction    Patient Active Problem List   Diagnosis Date Noted  . Diabetic ulcer of left midfoot associated with type 2 diabetes mellitus ([place]) 02/07/2020  . Morbid obesity, unspecified obesity type ([place]) 01/10/2020  . Hypertension associated with diabetes ([place]) 04/17/2018   . Dyslipidemia 04/17/2018  . PTSD (post-traumatic stress disorder) 08/11/2016  . Spinal stenosis, lumbar region with neurogenic claudication 05/05/2016  . Dysthymia 09/02/2015  . Chronic low back pain 09/02/2015  . Obesity, diabetes, and hypertension syndrome ([place]) 03/11/2008  . UNSPECIFIED ANEMIA 03/11/2008    Past Medical History:  Diagnosis Date  . Allergies   . Allergy   . Anemia   . Anxiety   . Depression   . Diabetes mellitus   . GERD (gastroesophageal reflux disease)   . Hyperlipidemia   . Hypertension   . Lumbar disc herniation   . Neuromuscular disorder ([place])    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   . PTSD (post-traumatic stress disorder)   . Scoliosis   . Sleep apnea     Past Surgical History:  Procedure Laterality Date  . LUMBAR LAMINECTOMY/DECOMPRESSION MICRODISCECTOMY Left 05/05/2016   Procedure: CENTRAL DECOMPRESSION L4-L5 AND FORAMINOTOMY FOR L4 ROOT AND L5 ROOT ON THE LEFT;  Surgeon: Jori Moll  Gioffre, MD;  Location: [facility];  Service: Orthopedics;  Laterality: Left;  . NO PAST SURGERIES      Social History   Socioeconomic History  . Marital status: Divorced    Spouse name: Not on file  . Number of children: Not on file  . Years of education: Not on file  . Highest education level: Not on file  Occupational History  . Not on file  Tobacco Use  . Smoking status: Former Smoker    Types: Cigars  . Smokeless tobacco: Current User    Types: Snuff  Vaping Use  . Vaping Use: Never used  Substance and Sexual Activity  . Alcohol use: Yes    Alcohol/week: 5.0 standard drinks    Types: 5 Shots of liquor per week    Comment: few times year  . Drug use: No  . Sexual activity: Not on file  Other Topics Concern  . Not on file  Social History Narrative  . Not on file   Social Determinants of Health   Financial Resource Strain: Not on file  Food Insecurity: Not on file  Transportation Needs: Not on file  Physical Activity: Not on file  Stress: Not on file   Social Connections: Not on file  Intimate Partner Violence: Not on file    Family History  Problem Relation Age of Onset  . Stroke Maternal Grandmother   . Colon cancer Neg Hx   . Esophageal cancer Neg Hx   . Rectal cancer Neg Hx   . Stomach cancer Neg Hx      Review of Systems  Constitutional: Negative.  Negative for chills and fever.  HENT: Negative.  Negative for congestion and sore throat.   Respiratory: Negative.  Negative for cough and shortness of breath.   Cardiovascular: Negative.  Negative for chest pain and palpitations.  Gastrointestinal: Negative.  Negative for abdominal pain, diarrhea, nausea and vomiting.  Genitourinary: Negative.  Negative for dysuria and hematuria.  Musculoskeletal: Positive for neck pain.  Skin: Negative.   Neurological: Negative.  Negative for dizziness and headaches.  All other systems reviewed and are negative.   Today's Vitals   06/10/20 0913  BP: 123/79  Pulse: (!) 106  Resp: 16  Temp: 98.2 F (36.8 C)  TempSrc: Temporal  SpO2: 99%  Weight: (!) 306 lb (138.8 kg)  Height: 6' 3"  (1.905 m)   Body mass index is 38.25 kg/m. Wt Readings from Last 3 Encounters:  06/10/20 (!) 306 lb (138.8 kg)  04/12/20 (!) 301 lb 9.4 oz (136.8 kg)  04/11/20 (!) 310 lb (140.6 kg)    Physical Exam Vitals reviewed.  Constitutional:      Appearance: He is obese.  HENT:     Head: Normocephalic.  Eyes:     Extraocular Movements: Extraocular movements intact.     Pupils: Pupils are equal, round, and reactive to light.  Cardiovascular:     Rate and Rhythm: Normal rate and regular rhythm.     Pulses: Normal pulses.     Heart sounds: Normal heart sounds.  Pulmonary:     Effort: Pulmonary effort is normal.     Breath sounds: Normal breath sounds.  Skin:    General: Skin is warm and dry.     Capillary Refill: Capillary refill takes less than 2 seconds.  Neurological:     General: No focal deficit present.     Mental Status: He is alert and  oriented to person, place, and time.  Psychiatric:  Mood and Affect: Mood normal.        Behavior: Behavior normal.    Results for orders placed or performed in visit on 06/10/20 (from the past 24 hour(s))  POCT glucose (manual entry)     Status: Abnormal   Collection Time: 06/10/20  9:39 AM  Result Value Ref Range   POC Glucose 320 (A) 70 - 99 mg/dl  POCT glycosylated hemoglobin (Hb A1C)     Status: Abnormal   Collection Time: 06/10/20  9:44 AM  Result Value Ref Range   Hemoglobin A1C 10.3 (A) 4.0 - 5.6 %   HbA1c POC (<> result, manual entry)     HbA1c, POC (prediabetic range)     HbA1c, POC (controlled diabetic range)      A total of 45 minutes was spent with the patient, greater than 50% of which was in counseling/coordination of care regarding diabetes and cardiovascular risks associated with this condition, review of all medications and changes made today, review of most recent blood work results including today's hemoglobin A1c, review of most recent office visit notes, education on nutrition, health maintenance items, prognosis, documentation, and need for follow-up in 3 months.  ASSESSMENT & PLAN: Hypertension associated with diabetes ([place]) Well-controlled hypertension.  Continue present medications. Improved diabetes with hemoglobin A1c better than before at 10.3.  Still above goal. Will increase Trulicity to 3 mg weekly.  Continue glipizide 10 mg twice a day.  We will start Invokana 300 mg daily. Diet and nutrition discussed. Continue rosuvastatin 20 mg daily. Follow-up in 3 months.  Amarien was seen today for diabetes and pre-op exam.  Diagnoses and all orders for this visit:  Hypertension associated with diabetes ([place]) -     POCT glucose (manual entry) -     POCT glycosylated hemoglobin (Hb A1C) -     Comprehensive metabolic panel -     Lipid panel  Obesity, diabetes, and hypertension syndrome ([facility])  Diabetic ulcer of left midfoot associated with type 2  diabetes mellitus, limited to breakdown of skin ([place])  Diabetic polyneuropathy associated with type 2 diabetes mellitus ([facility])  Degenerative disc disease, cervical  Chronic neck pain  Morbid obesity, unspecified obesity type ([facility])  Body mass index (BMI) of 38.0-38.9 in adult  Type 2 diabetes mellitus with hyperglycemia, without long-term current use of insulin ([facility]) -     Dulaglutide (TRULICITY) 3 FO/2.7XA SOPN; Inject 3 mg as directed once a week. -     canagliflozin (INVOKANA) 300 MG TABS tablet; Take 1 tablet (300 mg total) by mouth daily before breakfast.    Patient Instructions   Continue glipizide 10 mg twice a day. Increase Trulicity to 3 mg weekly. Start Invokana 300 mg daily. Follow-up in 3 months.    If you have lab work done today you will be contacted with your lab results within the next 2 weeks.  If you have not heard from [place] then please contact us. The fastest way to get your results is to register for My Chart.   IF you received an x-ray today, you will receive an invoice from [facility]. Please contact [facility] at [phone] with questions or concerns regarding your invoice.   IF you received labwork today, you will receive an invoice from [facility]. Please contact [facility] at [phone] with questions or concerns regarding your invoice.   Our billing staff will not be able to assist you with questions regarding bills from these companies.  You will be contacted with the lab results  as soon as they are available. The fastest way to get your results is to activate your My Chart account. Instructions are located on the last page of this paperwork. If you have not heard from [place] regarding the results in 2 weeks, please contact this office.     Diabetes Mellitus and Nutrition, Adult When you have diabetes, or diabetes mellitus, it is very important to have healthy eating habits because your blood sugar (glucose) levels are greatly affected  by what you eat and drink. Eating healthy foods in the right amounts, at about the same times every day, can help you:  Control your blood glucose.  Lower your risk of heart disease.  Improve your blood pressure.  Reach or maintain a healthy weight. What can affect my meal plan? Every person with diabetes is different, and each person has different needs for a meal plan. Your health care provider may recommend that you work with a dietitian to make a meal plan that is best for you. Your meal plan may vary depending on factors such as:  The calories you need.  The medicines you take.  Your weight.  Your blood glucose, blood pressure, and cholesterol levels.  Your activity level.  Other health conditions you have, such as heart or kidney disease. How do carbohydrates affect me? Carbohydrates, also called carbs, affect your blood glucose level more than any other type of food. Eating carbs naturally raises the amount of glucose in your blood. Carb counting is a method for keeping track of how many carbs you eat. Counting carbs is important to keep your blood glucose at a healthy level, especially if you use insulin or take certain oral diabetes medicines. It is important to know how many carbs you can safely have in each meal. This is different for every person. Your dietitian can help you calculate how many carbs you should have at each meal and for each snack. How does alcohol affect me? Alcohol can cause a sudden decrease in blood glucose (hypoglycemia), especially if you use insulin or take certain oral diabetes medicines. Hypoglycemia can be a life-threatening condition. Symptoms of hypoglycemia, such as sleepiness, dizziness, and confusion, are similar to symptoms of having too much alcohol.  Do not drink alcohol if: ? Your health care provider tells you not to drink. ? You are pregnant, may be pregnant, or are planning to become pregnant.  If you drink alcohol: ? Do not drink on  an empty stomach. ? Limit how much you use to:  0-1 drink a day for women.  0-2 drinks a day for men. ? Be aware of how much alcohol is in your drink. In the [place] one drink equals one 12 oz bottle of beer (355 mL), one 5 oz glass of wine (148 mL), or one 1 oz glass of hard liquor (44 mL). ? Keep yourself hydrated with water, diet soda, or unsweetened iced tea.  Keep in mind that regular soda, juice, and other mixers may contain a lot of sugar and must be counted as carbs. What are tips for following this plan? Reading food labels  Start by checking the serving size on the "Nutrition Facts" label of packaged foods and drinks. The amount of calories, carbs, fats, and other nutrients listed on the label is based on one serving of the item. Many items contain more than one serving per package.  Check the total grams (g) of carbs in one serving. You can calculate the number of servings of carbs  in one serving by dividing the total carbs by 15. For example, if a food has 30 g of total carbs per serving, it would be equal to 2 servings of carbs.  Check the number of grams (g) of saturated fats and trans fats in one serving. Choose foods that have a low amount or none of these fats.  Check the number of milligrams (mg) of salt (sodium) in one serving. Most people should limit total sodium intake to less than 2,300 mg per day.  Always check the nutrition information of foods labeled as "low-fat" or "nonfat." These foods may be higher in added sugar or refined carbs and should be avoided.  Talk to your dietitian to identify your daily goals for nutrients listed on the label. Shopping  Avoid buying canned, pre-made, or processed foods. These foods tend to be high in fat, sodium, and added sugar.  Shop around the outside edge of the grocery store. This is where you will most often find fresh fruits and vegetables, bulk grains, fresh meats, and fresh dairy. Cooking  Use low-heat cooking methods,  such as baking, instead of high-heat cooking methods like deep frying.  Cook using healthy oils, such as olive, canola, or sunflower oil.  Avoid cooking with butter, cream, or high-fat meats. Meal planning  Eat meals and snacks regularly, preferably at the same times every day. Avoid going long periods of time without eating.  Eat foods that are high in fiber, such as fresh fruits, vegetables, beans, and whole grains. Talk with your dietitian about how many servings of carbs you can eat at each meal.  Eat 4-6 oz (112-168 g) of lean protein each day, such as lean meat, chicken, fish, eggs, or tofu. One ounce (oz) of lean protein is equal to: ? 1 oz (28 g) of meat, chicken, or fish. ? 1 egg. ?  cup (62 g) of tofu.  Eat some foods each day that contain healthy fats, such as avocado, nuts, seeds, and fish.   What foods should I eat? Fruits Berries. Apples. Oranges. Peaches. Apricots. Plums. Grapes. Mango. Papaya. Pomegranate. Kiwi. Cherries. Vegetables Lettuce. Spinach. Leafy greens, including kale, chard, collard greens, and mustard greens. Beets. Cauliflower. Cabbage. Broccoli. Carrots. Green beans. Tomatoes. Peppers. Onions. Cucumbers. Brussels sprouts. Grains Whole grains, such as whole-wheat or whole-grain bread, crackers, tortillas, cereal, and pasta. Unsweetened oatmeal. Quinoa. Brown or wild rice. Meats and other proteins Seafood. Poultry without skin. Lean cuts of poultry and beef. Tofu. Nuts. Seeds. Dairy Low-fat or fat-free dairy products such as milk, yogurt, and cheese. The items listed above may not be a complete list of foods and beverages you can eat. Contact a dietitian for more information. What foods should I avoid? Fruits Fruits canned with syrup. Vegetables Canned vegetables. Frozen vegetables with butter or cream sauce. Grains Refined white flour and flour products such as bread, pasta, snack foods, and cereals. Avoid all processed foods. Meats and other  proteins Fatty cuts of meat. Poultry with skin. Breaded or fried meats. Processed meat. Avoid saturated fats. Dairy Full-fat yogurt, cheese, or milk. Beverages Sweetened drinks, such as soda or iced tea. The items listed above may not be a complete list of foods and beverages you should avoid. Contact a dietitian for more information. Questions to ask a health care provider  Do I need to meet with a diabetes educator?  Do I need to meet with a dietitian?  What number can I call if I have questions?  When are the best  times to check my blood glucose? Where to find more information:  American Diabetes Association: diabetes.org  Academy of Nutrition and Dietetics: www.eatright.[employer] of Diabetes and Digestive and Kidney Diseases: [employer]  Association of Diabetes Care and Education Specialists: www.diabeteseducator.org Summary  It is important to have healthy eating habits because your blood sugar (glucose) levels are greatly affected by what you eat and drink.  A healthy meal plan will help you control your blood glucose and maintain a healthy lifestyle.  Your health care provider may recommend that you work with a dietitian to make a meal plan that is best for you.  Keep in mind that carbohydrates (carbs) and alcohol have immediate effects on your blood glucose levels. It is important to count carbs and to use alcohol carefully. This information is not intended to replace advice given to you by your health care provider. Make sure you discuss any questions you have with your health care provider. Document Revised: 04/03/2019 Document Reviewed: 04/03/2019 Elsevier Patient Education  2021 Elsevier Inc.      Agustina Caroli, MD [facility]

## 2020-06-10 NOTE — Assessment & Plan Note (Signed)
Well-controlled hypertension.  Continue present medications. Improved diabetes with hemoglobin A1c better than before at 10.3.  Still above goal. Will increase Trulicity to 3 mg weekly.  Continue glipizide 10 mg twice a day.  We will start Invokana 300 mg daily. Diet and nutrition discussed. Continue rosuvastatin 20 mg daily. Follow-up in 3 months.

## 2020-06-11 LAB — LIPID PANEL
Chol/HDL Ratio: 4.3 ratio (ref 0.0–5.0)
Cholesterol, Total: 141 mg/dL (ref 100–199)
HDL: 33 mg/dL — ABNORMAL LOW (ref >39)
LDL Chol Calc (NIH): 77 mg/dL (ref 0–99)
Triglycerides: 179 mg/dL — ABNORMAL HIGH (ref 0–149)
VLDL Cholesterol Cal: 31 mg/dL (ref 5–40)

## 2020-06-11 LAB — COMPREHENSIVE METABOLIC PANEL
ALT: 24 IU/L (ref 0–44)
AST: 16 IU/L (ref 0–40)
Albumin/Globulin Ratio: 1.5 (ref 1.2–2.2)
Albumin: 4.4 g/dL (ref 4.0–5.0)
Alkaline Phosphatase: 63 IU/L (ref 44–121)
BUN/Creatinine Ratio: 12 (ref 9–20)
BUN: 11 mg/dL (ref 6–20)
Bilirubin Total: 0.4 mg/dL (ref 0.0–1.2)
CO2: 25 mmol/L (ref 20–29)
Calcium: 9.7 mg/dL (ref 8.7–10.2)
Chloride: 96 mmol/L (ref 96–106)
Creatinine, Ser: 0.93 mg/dL (ref 0.76–1.27)
GFR calc Af Amer: 121 mL/min/{1.73_m2} (ref >59)
GFR calc non Af Amer: 105 mL/min/{1.73_m2} (ref >59)
Globulin, Total: 3 g/dL (ref 1.5–4.5)
Glucose: 299 mg/dL — ABNORMAL HIGH (ref 65–99)
Potassium: 3.8 mmol/L (ref 3.5–5.2)
Sodium: 137 mmol/L (ref 134–144)
Total Protein: 7.4 g/dL (ref 6.0–8.5)

## 2020-06-20 ENCOUNTER — Encounter: Payer: Self-pay | Admitting: Emergency Medicine

## 2020-06-23 NOTE — Telephone Encounter (Signed)
Pt reports invokana too expensive at $50 and is requesting a alternative

## 2020-06-24 NOTE — Telephone Encounter (Signed)
He is on high doses of Trulicity and glipizide.  He is intolerant to Metformin.  Other than daily insulin, no other cheaper alternative I can think of.  Thanks.

## 2020-09-11 ENCOUNTER — Ambulatory Visit: Payer: Self-pay | Admitting: Emergency Medicine

## 2020-09-18 ENCOUNTER — Encounter: Payer: Self-pay | Admitting: Emergency Medicine

## 2020-09-18 ENCOUNTER — Ambulatory Visit: Payer: 59 | Admitting: Emergency Medicine

## 2020-09-18 ENCOUNTER — Other Ambulatory Visit: Payer: Self-pay

## 2020-09-18 VITALS — BP 126/86 | HR 98 | Temp 97.8°F | Ht 75.0 in | Wt 296.0 lb

## 2020-09-18 DIAGNOSIS — E669 Obesity, unspecified: Secondary | ICD-10-CM

## 2020-09-18 DIAGNOSIS — E1159 Type 2 diabetes mellitus with other circulatory complications: Secondary | ICD-10-CM

## 2020-09-18 DIAGNOSIS — E785 Hyperlipidemia, unspecified: Secondary | ICD-10-CM | POA: Diagnosis not present

## 2020-09-18 DIAGNOSIS — E1169 Type 2 diabetes mellitus with other specified complication: Secondary | ICD-10-CM | POA: Diagnosis not present

## 2020-09-18 DIAGNOSIS — M5090 Cervical disc disorder, unspecified, unspecified cervical region: Secondary | ICD-10-CM

## 2020-09-18 DIAGNOSIS — I152 Hypertension secondary to endocrine disorders: Secondary | ICD-10-CM | POA: Diagnosis not present

## 2020-09-18 LAB — POCT GLYCOSYLATED HEMOGLOBIN (HGB A1C): Hemoglobin A1C: 8.6 % — AB (ref 4.0–5.6)

## 2020-09-18 NOTE — Assessment & Plan Note (Addendum)
Well-controlled hypertension.  Continue amlodipine 10 mg daily, Hyzaar 100-12.5 mg daily. Diet and nutrition discussed. Much improved diabetes with hemoglobin A1c of 8.6.  Responding very well to Trulicity 3 mg weekly.  Continue glipizide 10 mg twice a day. Diet and nutrition discussed. Follow-up in 3 months.

## 2020-09-18 NOTE — Assessment & Plan Note (Signed)
Diet and nutrition discussed.  Continue rosuvastatin 20 mg daily. 

## 2020-09-18 NOTE — Patient Instructions (Signed)
Diabetes Mellitus and Nutrition, Adult When you have diabetes, or diabetes mellitus, it is very important to have healthy eating habits because your blood sugar (glucose) levels are greatly affected by what you eat and drink. Eating healthy foods in the right amounts, at about the same times every day, can help you:  Control your blood glucose.  Lower your risk of heart disease.  Improve your blood pressure.  Reach or maintain a healthy weight. What can affect my meal plan? Every person with diabetes is different, and each person has different needs for a meal plan. Your health care provider may recommend that you work with a dietitian to make a meal plan that is best for you. Your meal plan may vary depending on factors such as:  The calories you need.  The medicines you take.  Your weight.  Your blood glucose, blood pressure, and cholesterol levels.  Your activity level.  Other health conditions you have, such as heart or kidney disease. How do carbohydrates affect me? Carbohydrates, also called carbs, affect your blood glucose level more than any other type of food. Eating carbs naturally raises the amount of glucose in your blood. Carb counting is a method for keeping track of how many carbs you eat. Counting carbs is important to keep your blood glucose at a healthy level, especially if you use insulin or take certain oral diabetes medicines. It is important to know how many carbs you can safely have in each meal. This is different for every person. Your dietitian can help you calculate how many carbs you should have at each meal and for each snack. How does alcohol affect me? Alcohol can cause a sudden decrease in blood glucose (hypoglycemia), especially if you use insulin or take certain oral diabetes medicines. Hypoglycemia can be a life-threatening condition. Symptoms of hypoglycemia, such as sleepiness, dizziness, and confusion, are similar to symptoms of having too much  alcohol.  Do not drink alcohol if: ? Your health care provider tells you not to drink. ? You are pregnant, may be pregnant, or are planning to become pregnant.  If you drink alcohol: ? Do not drink on an empty stomach. ? Limit how much you use to:  0-1 drink a day for women.  0-2 drinks a day for men. ? Be aware of how much alcohol is in your drink. In the [place] one drink equals one 12 oz bottle of beer (355 mL), one 5 oz glass of wine (148 mL), or one 1 oz glass of hard liquor (44 mL). ? Keep yourself hydrated with water, diet soda, or unsweetened iced tea.  Keep in mind that regular soda, juice, and other mixers may contain a lot of sugar and must be counted as carbs. What are tips for following this plan? Reading food labels  Start by checking the serving size on the "Nutrition Facts" label of packaged foods and drinks. The amount of calories, carbs, fats, and other nutrients listed on the label is based on one serving of the item. Many items contain more than one serving per package.  Check the total grams (g) of carbs in one serving. You can calculate the number of servings of carbs in one serving by dividing the total carbs by 15. For example, if a food has 30 g of total carbs per serving, it would be equal to 2 servings of carbs.  Check the number of grams (g) of saturated fats and trans fats in one serving. Choose foods that have   a low amount or none of these fats.  Check the number of milligrams (mg) of salt (sodium) in one serving. Most people should limit total sodium intake to less than 2,300 mg per day.  Always check the nutrition information of foods labeled as "low-fat" or "nonfat." These foods may be higher in added sugar or refined carbs and should be avoided.  Talk to your dietitian to identify your daily goals for nutrients listed on the label. Shopping  Avoid buying canned, pre-made, or processed foods. These foods tend to be high in fat, sodium, and added  sugar.  Shop around the outside edge of the grocery store. This is where you will most often find fresh fruits and vegetables, bulk grains, fresh meats, and fresh dairy. Cooking  Use low-heat cooking methods, such as baking, instead of high-heat cooking methods like deep frying.  Cook using healthy oils, such as olive, canola, or sunflower oil.  Avoid cooking with butter, cream, or high-fat meats. Meal planning  Eat meals and snacks regularly, preferably at the same times every day. Avoid going long periods of time without eating.  Eat foods that are high in fiber, such as fresh fruits, vegetables, beans, and whole grains. Talk with your dietitian about how many servings of carbs you can eat at each meal.  Eat 4-6 oz (112-168 g) of lean protein each day, such as lean meat, chicken, fish, eggs, or tofu. One ounce (oz) of lean protein is equal to: ? 1 oz (28 g) of meat, chicken, or fish. ? 1 egg. ?  cup (62 g) of tofu.  Eat some foods each day that contain healthy fats, such as avocado, nuts, seeds, and fish.   What foods should I eat? Fruits Berries. Apples. Oranges. Peaches. Apricots. Plums. Grapes. Mango. Papaya. Pomegranate. Kiwi. Cherries. Vegetables Lettuce. Spinach. Leafy greens, including kale, chard, collard greens, and mustard greens. Beets. Cauliflower. Cabbage. Broccoli. Carrots. Green beans. Tomatoes. Peppers. Onions. Cucumbers. Brussels sprouts. Grains Whole grains, such as whole-wheat or whole-grain bread, crackers, tortillas, cereal, and pasta. Unsweetened oatmeal. Quinoa. Brown or wild rice. Meats and other proteins Seafood. Poultry without skin. Lean cuts of poultry and beef. Tofu. Nuts. Seeds. Dairy Low-fat or fat-free dairy products such as milk, yogurt, and cheese. The items listed above may not be a complete list of foods and beverages you can eat. Contact a dietitian for more information. What foods should I avoid? Fruits Fruits canned with  syrup. Vegetables Canned vegetables. Frozen vegetables with butter or cream sauce. Grains Refined white flour and flour products such as bread, pasta, snack foods, and cereals. Avoid all processed foods. Meats and other proteins Fatty cuts of meat. Poultry with skin. Breaded or fried meats. Processed meat. Avoid saturated fats. Dairy Full-fat yogurt, cheese, or milk. Beverages Sweetened drinks, such as soda or iced tea. The items listed above may not be a complete list of foods and beverages you should avoid. Contact a dietitian for more information. Questions to ask a health care provider  Do I need to meet with a diabetes educator?  Do I need to meet with a dietitian?  What number can I call if I have questions?  When are the best times to check my blood glucose? Where to find more information:  American Diabetes Association: diabetes.org  Academy of Nutrition and Dietetics: www.eatright.org  National Institute of Diabetes and Digestive and Kidney Diseases: www.niddk.nih.gov  Association of Diabetes Care and Education Specialists: www.diabeteseducator.org Summary  It is important to have healthy eating   habits because your blood sugar (glucose) levels are greatly affected by what you eat and drink.  A healthy meal plan will help you control your blood glucose and maintain a healthy lifestyle.  Your health care provider may recommend that you work with a dietitian to make a meal plan that is best for you.  Keep in mind that carbohydrates (carbs) and alcohol have immediate effects on your blood glucose levels. It is important to count carbs and to use alcohol carefully. This information is not intended to replace advice given to you by your health care provider. Make sure you discuss any questions you have with your health care provider. Document Revised: 04/03/2019 Document Reviewed: 04/03/2019 [employer]vier Patient Education  2021 [employer]  

## 2020-09-18 NOTE — Assessment & Plan Note (Signed)
Has several cervical disks herniated.  Was evaluated by neurosurgery and recommended surgery.  Needs diabetic clearance.  Hemoglobin A1c has to be less than 8.

## 2020-09-18 NOTE — Assessment & Plan Note (Signed)
Losing weight and eating better.  Diabetes improving.  Diet and nutrition discussed.

## 2020-09-18 NOTE — Progress Notes (Signed)
[name] 40 y.o.   Chief Complaint  Patient presents with  . surgerial clearance    Pt getting herniated disk surgery    HISTORY OF PRESENT ILLNESS: This is a 40 y.o. male with history of several herniated cervical disks contemplating surgery this summer.  Has a history of diabetes and hypertension.  Needs surgical clearance. #1 diabetes: Presently on Trulicity 3 mg weekly, glipizide 10 mg twice a day #2 hypertension: On amlodipine 10 mg daily, Hyzaar 100-12.5 mg daily #3 dyslipidemia: On rosuvastatin 20 mg daily. Doing well.  Eating better and losing weight. Has no complaints or medical concerns today.  HPI   Prior to Admission medications   Medication Sig Start Date End Date Taking? Authorizing Provider  ibuprofen (ADVIL) 800 MG tablet Take 1 tablet (800 mg total) by mouth 3 (three) times daily. 10/05/19  Yes Raylene Everts, MD  loratadine (CLARITIN) 10 MG tablet Take 10 mg by mouth as needed for allergies.   Yes [provider]  losartan-hydrochlorothiazide (HYZAAR) 100-12.5 MG tablet Take 1 tablet by mouth daily. 02/07/20  Yes Nathon Stefanski, Ines Bloomer, MD  traZODone (DESYREL) 100 MG tablet TAKE 1 TABLET BY MOUTH EVERY DAY AT BEDTIME AS NEEDED FOR SLEEP 12/10/19  Yes [provider]  acetaminophen (TYLENOL) 500 MG tablet Take 1,000 mg by mouth every 6 (six) hours as needed for mild pain.    [provider]  amLODipine (NORVASC) 10 MG tablet Take 1 tablet (10 mg total) by mouth daily. 02/07/20 05/07/20  Horald Pollen, MD  aspirin EC 81 MG tablet Take 1 tablet (81 mg total) by mouth daily. 07/11/14   Shawnee Knapp, MD  blood glucose meter kit and supplies Dispense based on patient and insurance preference. Use up to four times daily as directed. (FOR ICD-10 E10.9, E11.9). 01/16/19   Eugenie Filler, MD  doxylamine, Sleep, (UNISOM) 25 MG tablet Take 25 mg by mouth at bedtime as needed. Patient not taking: Reported on 09/18/2020    [provider]  Dulaglutide (TRULICITY) 3 GT/3.6IW SOPN Inject 3 mg as directed once a week. 06/10/20   Horald Pollen, MD  FLUoxetine (PROZAC) 20 MG capsule Take 20 mg by mouth daily. 12/20/19   [provider]  gabapentin (NEURONTIN) 300 MG capsule Take 1 capsule (300 mg total) by mouth 3 (three) times daily for 10 days. 04/12/20 04/22/20  Joy, Shawn C, PA-C  glipiZIDE (GLUCOTROL) 10 MG tablet Take 1 tablet (10 mg total) by mouth 2 (two) times daily before a meal. 02/07/20 05/07/20  Joshual Terrio, Ines Bloomer, MD  HYDROcodone-acetaminophen (NORCO/VICODIN) 5-325 MG tablet Take 1 tablet by mouth every 6 (six) hours as needed for severe pain. Patient not taking: No sig reported 04/12/20   Joy, Shawn C, PA-C  methocarbamol (ROBAXIN) 750 MG tablet Take 1 tablet (750 mg total) by mouth 2 (two) times daily as needed for muscle spasms (or muscle tightness). Patient not taking: No sig reported 04/12/20   Joy, Shawn C, PA-C  mupirocin cream (BACTROBAN) 2 % Apply 1 application topically 2 (two) times daily. Patient not taking: No sig reported 02/27/20   Horton, Barbette Hair, MD  mupirocin ointment (BACTROBAN) 2 % Apply 1 application topically 3 (three) times daily. Patient not taking: Reported on 09/18/2020 02/27/20   [provider]  nitroGLYCERIN (NITROSTAT) 0.4 MG SL tablet Place 1 tablet (0.4 mg total) under the tongue every 5 (five) minutes as needed for chest pain. Patient not taking: Reported on 09/18/2020  01/16/19   Eugenie Filler, MD  rosuvastatin (CRESTOR) 20 MG tablet Take 1 tablet (20 mg total) by mouth daily. 02/07/20 05/07/20  Horald Pollen, MD    Allergies  Allergen Reactions  . Amoxicillin Anaphylaxis and Other (See Comments)    Has patient had a PCN reaction causing immediate rash, facial/tongue/throat swelling, SOB or lightheadedness with hypotension: yes Has patient had a PCN reaction causing severe rash involving mucus membranes or skin necrosis: no Has patient had  a PCN reaction that required hospitalization yes Has patient had a PCN reaction occurring within the last 10 years: yes If all of the above answers are "NO", then may proceed with Cephalosporin use.  [name] Penicillins Anaphylaxis and Other (See Comments)    Patient was told to list this as an allergy because he is allergic to Amoxicillin Has patient had a PCN reaction causing immediate rash, facial/tongue/throat swelling, SOB or lightheadedness with hypotension: yes for Amoxicillin Has patient had a PCN reaction causing severe rash involving mucus membranes or skin necrosis: no Has patient had a PCN reaction that required hospitalization yes for Amoxicillin Has patient had a PCN reaction occurring within the last 10 years: yes for Amoxicillin If all o  . Ambien [Zolpidem] Other (See Comments)    Passed out. And memory loss  . Metformin And Related Other (See Comments)    SEVERE GI UPSET  . Other Other (See Comments)    Powder in some gloves - localized itching but not allergic to latex, benadryl usually helps with this reaction    Patient Active Problem List   Diagnosis Date Noted  . Diabetic ulcer of left midfoot associated with type 2 diabetes mellitus ([place]) 02/07/2020  . Morbid obesity, unspecified obesity type ([place]) 01/10/2020  . Hypertension associated with diabetes ([place]) 04/17/2018  . Dyslipidemia 04/17/2018  . PTSD (post-traumatic stress disorder) 08/11/2016  . Spinal stenosis, lumbar region with neurogenic claudication 05/05/2016  . Dysthymia 09/02/2015  . Chronic low back pain 09/02/2015  . Obesity, diabetes, and hypertension syndrome ([place]) 03/11/2008  . UNSPECIFIED ANEMIA 03/11/2008    Past Medical History:  Diagnosis Date  . Allergies   . Allergy   . Anemia   . Anxiety   . Depression   . Diabetes mellitus   . GERD (gastroesophageal reflux disease)   . Hyperlipidemia   . Hypertension   . Lumbar disc herniation   . 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 PTSD (post-traumatic stress disorder)   . Scoliosis   . Sleep apnea     Past Surgical History:  Procedure Laterality Date  . LUMBAR LAMINECTOMY/DECOMPRESSION MICRODISCECTOMY Left 05/05/2016   Procedure: CENTRAL DECOMPRESSION L4-L5 AND FORAMINOTOMY FOR L4 ROOT AND L5 ROOT ON THE LEFT;  Surgeon: Latanya Maudlin, MD;  Location: [facility];  Service: Orthopedics;  Laterality: Left;  . NO PAST SURGERIES      Social History   Socioeconomic History  . Marital status: Divorced    Spouse name: Not on file  . Number of children: Not on file  . Years of education: Not on file  . Highest education level: Not on file  Occupational History  . Not on file  Tobacco Use  . Smoking status: Former Smoker    Types: Cigars  . Smokeless tobacco: Current User    Types: Snuff  Vaping Use  . Vaping Use: Never used  Substance and Sexual Activity  . Alcohol use: Yes    Alcohol/week: 5.0 standard  drinks    Types: 5 Shots of liquor per week    Comment: few times year  . Drug use: No  . Sexual activity: Not on file  Other Topics Concern  . Not on file  Social History Narrative  . Not on file   Social Determinants of Health   Financial Resource Strain: Not on file  Food Insecurity: Not on file  Transportation Needs: Not on file  Physical Activity: Not on file  Stress: Not on file  Social Connections: Not on file  Intimate Partner Violence: Not on file    Family History  Problem Relation Age of Onset  . Stroke Maternal Grandmother   . Colon cancer Neg Hx   . Esophageal cancer Neg Hx   . Rectal cancer Neg Hx   . Stomach cancer Neg Hx      Review of Systems  Constitutional: Negative.  Negative for chills and fever.  HENT: Negative.  Negative for congestion and sore throat.   Respiratory: Negative.  Negative for cough and shortness of breath.   Cardiovascular: Negative.  Negative for chest pain and palpitations.  Gastrointestinal: Negative for abdominal pain, diarrhea,  nausea and vomiting.  Genitourinary: Negative.  Negative for dysuria and hematuria.  Skin: Negative.  Negative for rash.  Neurological: Negative.  Negative for dizziness and headaches.  All other systems reviewed and are negative.  Today's Vitals   09/18/20 1108  BP: 126/86  Pulse: 98  Temp: 97.8 F (36.6 C)  TempSrc: Oral  SpO2: 98%  Weight: 276 lb (125.2 kg)  Height: _0  (1.905 m)   Body mass index is 34.5 kg/m.   Physical Exam Vitals reviewed.  Constitutional:      Appearance: Normal appearance.  HENT:     Head: Normocephalic.  Eyes:     Extraocular Movements: Extraocular movements intact.     Pupils: Pupils are equal, round, and reactive to light.  Cardiovascular:     Rate and Rhythm: Normal rate and regular rhythm.     Pulses: Normal pulses.     Heart sounds: Normal heart sounds.  Pulmonary:     Effort: Pulmonary effort is normal.     Breath sounds: Normal breath sounds.  Musculoskeletal:        General: Normal range of motion.     Cervical back: Normal range of motion and neck supple.  Skin:    General: Skin is warm and dry.     Capillary Refill: Capillary refill takes less than 2 seconds.  Neurological:     General: No focal deficit present.     Mental Status: He is alert and oriented to person, place, and time.     Results for orders placed or performed in visit on 09/18/20 (from the past 24 hour(s))  POCT glycosylated hemoglobin (Hb A1C)     Status: Abnormal   Collection Time: 09/18/20 11:42 AM  Result Value Ref Range   Hemoglobin A1C 8.6 (A) 4.0 - 5.6 %   HbA1c POC (<> result, manual entry)     HbA1c, POC (prediabetic range)     HbA1c, POC (controlled diabetic range)      ASSESSMENT & PLAN: A total of 30 minutes was spent with the patient and counseling/coordination of care regarding diabetes, hypertension, dyslipidemia and cardiovascular risks associated with this condition, need for diabetes to be under control before going for surgery, review  of most recent blood work results including today's hemoglobin A1c, review of all medications, education on nutrition, advised to  discontinue baby aspirin today, health maintenance, prognosis, documentation and need for follow-up.  Hypertension associated with diabetes ([place]) Well-controlled hypertension.  Continue amlodipine 10 mg daily, Hyzaar 100-12.5 mg daily. Diet and nutrition discussed. Much improved diabetes with hemoglobin A1c of 8.6.  Responding very well to Trulicity 3 mg weekly.  Continue glipizide 10 mg twice a day. Diet and nutrition discussed. Follow-up in 3 months.  Obesity, diabetes, and hypertension syndrome ([place]) Losing weight and eating better.  Diabetes improving.  Diet and nutrition discussed.  Dyslipidemia Diet and nutrition discussed.  Continue rosuvastatin 20 mg daily.  Cervical disc disease Has several cervical disks herniated.  Was evaluated by neurosurgery and recommended surgery.  Needs diabetic clearance.  Hemoglobin A1c has to be less than 8.  [place] was seen today for surgerial clearance.  Diagnoses and all orders for this visit:  Obesity, diabetes, and hypertension syndrome ([place])  Hypertension associated with diabetes ([place]) -     POCT glycosylated hemoglobin (Hb A1C)  Dyslipidemia  Cervical disc disease    Patient Instructions   Diabetes Mellitus and Nutrition, Adult When you have diabetes, or diabetes mellitus, it is very important to have healthy eating habits because your blood sugar (glucose) levels are greatly affected by what you eat and drink. Eating healthy foods in the right amounts, at about the same times every day, can help you:  Control your blood glucose.  Lower your risk of heart disease.  Improve your blood pressure.  Reach or maintain a healthy weight. What can affect my meal plan? Every person with diabetes is different, and each person has different needs for a meal plan. Your health care provider may recommend that you  work with a dietitian to make a meal plan that is best for you. Your meal plan may vary depending on factors such as:  The calories you need.  The medicines you take.  Your weight.  Your blood glucose, blood pressure, and cholesterol levels.  Your activity level.  Other health conditions you have, such as heart or kidney disease. How do carbohydrates affect me? Carbohydrates, also called carbs, affect your blood glucose level more than any other type of food. Eating carbs naturally raises the amount of glucose in your blood. Carb counting is a method for keeping track of how many carbs you eat. Counting carbs is important to keep your blood glucose at a healthy level, especially if you use insulin or take certain oral diabetes medicines. It is important to know how many carbs you can safely have in each meal. This is different for every person. Your dietitian can help you calculate how many carbs you should have at each meal and for each snack. How does alcohol affect me? Alcohol can cause a sudden decrease in blood glucose (hypoglycemia), especially if you use insulin or take certain oral diabetes medicines. Hypoglycemia can be a life-threatening condition. Symptoms of hypoglycemia, such as sleepiness, dizziness, and confusion, are similar to symptoms of having too much alcohol.  Do not drink alcohol if: ? Your health care provider tells you not to drink. ? You are pregnant, may be pregnant, or are planning to become pregnant.  If you drink alcohol: ? Do not drink on an empty stomach. ? Limit how much you use to:  0-1 drink a day for women.  0-2 drinks a day for men. ? Be aware of how much alcohol is in your drink. In the [place] one drink equals one 12 oz bottle of beer (355 mL), one  5 oz glass of wine (148 mL), or one 1 oz glass of hard liquor (44 mL). ? Keep yourself hydrated with water, diet soda, or unsweetened iced tea.  Keep in mind that regular soda, juice, and other mixers  may contain a lot of sugar and must be counted as carbs. What are tips for following this plan? Reading food labels  Start by checking the serving size on the "Nutrition Facts" label of packaged foods and drinks. The amount of calories, carbs, fats, and other nutrients listed on the label is based on one serving of the item. Many items contain more than one serving per package.  Check the total grams (g) of carbs in one serving. You can calculate the number of servings of carbs in one serving by dividing the total carbs by 15. For example, if a food has 30 g of total carbs per serving, it would be equal to 2 servings of carbs.  Check the number of grams (g) of saturated fats and trans fats in one serving. Choose foods that have a low amount or none of these fats.  Check the number of milligrams (mg) of salt (sodium) in one serving. Most people should limit total sodium intake to less than 2,300 mg per day.  Always check the nutrition information of foods labeled as "low-fat" or "nonfat." These foods may be higher in added sugar or refined carbs and should be avoided.  Talk to your dietitian to identify your daily goals for nutrients listed on the label. Shopping  Avoid buying canned, pre-made, or processed foods. These foods tend to be high in fat, sodium, and added sugar.  Shop around the outside edge of the grocery store. This is where you will most often find fresh fruits and vegetables, bulk grains, fresh meats, and fresh dairy. Cooking  Use low-heat cooking methods, such as baking, instead of high-heat cooking methods like deep frying.  Cook using healthy oils, such as olive, canola, or sunflower oil.  Avoid cooking with butter, cream, or high-fat meats. Meal planning  Eat meals and snacks regularly, preferably at the same times every day. Avoid going long periods of time without eating.  Eat foods that are high in fiber, such as fresh fruits, vegetables, beans, and whole grains.  Talk with your dietitian about how many servings of carbs you can eat at each meal.  Eat 4-6 oz (112-168 g) of lean protein each day, such as lean meat, chicken, fish, eggs, or tofu. One ounce (oz) of lean protein is equal to: ? 1 oz (28 g) of meat, chicken, or fish. ? 1 egg. ?  cup (62 g) of tofu.  Eat some foods each day that contain healthy fats, such as avocado, nuts, seeds, and fish.   What foods should I eat? Fruits Berries. Apples. Oranges. Peaches. Apricots. Plums. Grapes. Mango. Papaya. Pomegranate. Kiwi. Cherries. Vegetables Lettuce. Spinach. Leafy greens, including kale, chard, collard greens, and mustard greens. Beets. Cauliflower. Cabbage. Broccoli. Carrots. Green beans. Tomatoes. Peppers. Onions. Cucumbers. Brussels sprouts. Grains Whole grains, such as whole-wheat or whole-grain bread, crackers, tortillas, cereal, and pasta. Unsweetened oatmeal. Quinoa. Brown or wild rice. Meats and other proteins Seafood. Poultry without skin. Lean cuts of poultry and beef. Tofu. Nuts. Seeds. Dairy Low-fat or fat-free dairy products such as milk, yogurt, and cheese. The items listed above may not be a complete list of foods and beverages you can eat. Contact a dietitian for more information. What foods should I avoid? Fruits Fruits canned with  syrup. Vegetables Canned vegetables. Frozen vegetables with butter or cream sauce. Grains Refined white flour and flour products such as bread, pasta, snack foods, and cereals. Avoid all processed foods. Meats and other proteins Fatty cuts of meat. Poultry with skin. Breaded or fried meats. Processed meat. Avoid saturated fats. Dairy Full-fat yogurt, cheese, or milk. Beverages Sweetened drinks, such as soda or iced tea. The items listed above may not be a complete list of foods and beverages you should avoid. Contact a dietitian for more information. Questions to ask a health care provider  Do I need to meet with a diabetes educator?  Do I  need to meet with a dietitian?  What number can I call if I have questions?  When are the best times to check my blood glucose? Where to find more information:  American Diabetes Association: diabetes.org  Academy of Nutrition and Dietetics: www.eatright.[employer] of Diabetes and Digestive and Kidney Diseases: [employer]  Association of Diabetes Care and Education Specialists: www.diabeteseducator.org Summary  It is important to have healthy eating habits because your blood sugar (glucose) levels are greatly affected by what you eat and drink.  A healthy meal plan will help you control your blood glucose and maintain a healthy lifestyle.  Your health care provider may recommend that you work with a dietitian to make a meal plan that is best for you.  Keep in mind that carbohydrates (carbs) and alcohol have immediate effects on your blood glucose levels. It is important to count carbs and to use alcohol carefully. This information is not intended to replace advice given to you by your health care provider. Make sure you discuss any questions you have with your health care provider. Document Revised: 04/03/2019 Document Reviewed: 04/03/2019 Elsevier Patient Education  2021 [place], MD [facility]

## 2021-01-12 ENCOUNTER — Telehealth: Payer: 59 | Admitting: Physician Assistant

## 2021-01-12 ENCOUNTER — Encounter: Payer: Self-pay | Admitting: Nurse Practitioner

## 2021-01-12 ENCOUNTER — Telehealth: Payer: 59 | Admitting: Nurse Practitioner

## 2021-01-12 DIAGNOSIS — J0140 Acute pansinusitis, unspecified: Secondary | ICD-10-CM

## 2021-01-12 MED ORDER — DOXYCYCLINE HYCLATE 100 MG PO TABS: 100.0000 mg | ORAL_TABLET | Freq: Two times a day (BID) | ORAL | 0 refills | Status: AC

## 2021-01-12 MED ORDER — FLUTICASONE PROPIONATE 50 MCG/ACT NA SUSP: 2.0000 | Freq: Every day | NASAL | 0 refills | Status: DC

## 2021-01-12 MED ORDER — DOXYCYCLINE HYCLATE 100 MG PO TABS: 100.0000 mg | ORAL_TABLET | Freq: Two times a day (BID) | ORAL | 0 refills | Status: DC

## 2021-01-12 NOTE — Patient Instructions (Signed)
[name], thank you for joining Mar Daring, PA-C for today's virtual visit.  While this provider is not your primary care provider (PCP), if your PCP is located in our provider database this encounter information will be shared with them immediately following your visit.  Consent: (Patient) [name] provided verbal consent for this virtual visit at the beginning of the encounter.  Current Medications:  Current Outpatient Medications:    acetaminophen (TYLENOL) 500 MG tablet, Take 1,000 mg by mouth every 6 (six) hours as needed for mild pain., Disp: , Rfl:    amLODipine (NORVASC) 10 MG tablet, Take 1 tablet (10 mg total) by mouth daily., Disp: 90 tablet, Rfl: 3   blood glucose meter kit and supplies, Dispense based on patient and insurance preference. Use up to four times daily as directed. 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 (Patient not taking: Reported on 09/18/2020), Disp: , Rfl:    Dulaglutide (TRULICITY) 3 QZ/0.0PQ SOPN, Inject 3 mg as directed once a week., Disp: 6 mL, Rfl: 5   FLUoxetine (PROZAC) 20 MG capsule, Take 20 mg by mouth daily., Disp: , Rfl:    fluticasone (FLONASE) 50 MCG/ACT nasal spray, Place 2 sprays into both nostrils daily., Disp: 16 g, Rfl: 0   gabapentin (NEURONTIN) 300 MG capsule, Take 1 capsule (300 mg total) by mouth 3 (three) times daily for 10 days., Disp: 30 capsule, Rfl: 0   glipiZIDE (GLUCOTROL) 10 MG tablet, Take 1 tablet (10 mg total) by mouth 2 (two) times daily before a meal., Disp: 180 tablet, Rfl: 3   ibuprofen (ADVIL) 800 MG tablet, Take 1 tablet (800 mg total) by mouth 3 (three) times daily., Disp: 21 tablet, Rfl: 0   loratadine (CLARITIN) 10 MG tablet, Take 10 mg by mouth as needed for allergies.,  Disp: , Rfl:    losartan-hydrochlorothiazide (HYZAAR) 100-12.5 MG tablet, Take 1 tablet by mouth daily., Disp: 90 tablet, Rfl: 3   rosuvastatin (CRESTOR) 20 MG tablet, Take 1 tablet (20 mg total) by mouth daily., Disp: 90 tablet, Rfl: 3   traZODone (DESYREL) 100 MG tablet, TAKE 1 TABLET BY MOUTH EVERY DAY AT BEDTIME AS NEEDED FOR SLEEP, Disp: , Rfl:   Current Facility-Administered Medications:    0.9 %  sodium chloride infusion, 500 mL, Intravenous, Once, [place], MD   Medications ordered in this encounter:  Meds ordered this encounter  Medications   doxycycline (VIBRA-TABS) 100 MG tablet    Sig: Take 1 tablet (100 mg total) by mouth 2 (two) times daily for 10 days.    Dispense:  20 tablet    Refill:  0    Order Specific Question:   Supervising Provider    Answer:   MILLER, BRIAN [[id number]]   fluticasone (FLONASE) 50 MCG/ACT nasal spray    Sig: Place 2 sprays into both nostrils daily.    Dispense:  16 g    Refill:  0    Order Specific Question:   Supervising Provider    Answer:   Sabra Heck, Roberta     *If you need refills on other medications prior to your next appointment, please contact your pharmacy*  Follow-Up: Call back or seek an in-person evaluation if the symptoms  worsen or if the condition fails to improve as anticipated.  Other Instructions Sinusitis, Adult Sinusitis is soreness and swelling (inflammation) of your sinuses. Sinuses are hollow spaces in the bones around your face. They are located: Around your eyes. In the middle of your forehead. Behind your nose. In your cheekbones. Your sinuses and nasal passages are lined with a fluid called mucus. Mucus drains out of your sinuses. Swelling can trap mucus in your sinuses. This lets germs (bacteria, virus, or fungus) grow, which leads to infection. Most of the time, this condition is caused by a virus. What are the causes? This condition is caused by: Allergies. Asthma. Germs. Things that block your nose  or sinuses. Growths in the nose (nasal polyps). Chemicals or irritants in the air. Fungus (rare). What increases the risk? You are more likely to develop this condition if: You have a weak body defense system (immune system). You do a lot of swimming or diving. You use nasal sprays too much. You smoke. What are the signs or symptoms? The main symptoms of this condition are pain and a feeling of pressure around the sinuses. Other symptoms include: Stuffy nose (congestion). Runny nose (drainage). Swelling and warmth in the sinuses. Headache. Toothache. A cough that may get worse at night. Mucus that collects in the throat or the back of the nose (postnasal drip). Being unable to smell and taste. Being very tired (fatigue). A fever. Sore throat. Bad breath. How is this diagnosed? This condition is diagnosed based on: Your symptoms. Your medical history. A physical exam. Tests to find out if your condition is short-term (acute) or long-term (chronic). Your doctor may: Check your nose for growths (polyps). Check your sinuses using a tool that has a light (endoscope). Check for allergies or germs. Do imaging tests, such as an MRI or CT scan. How is this treated? Treatment for this condition depends on the cause and whether it is short-term or long-term. If caused by a virus, your symptoms should go away on their own within 10 days. You may be given medicines to relieve symptoms. They include: Medicines that shrink swollen tissue in the nose. Medicines that treat allergies (antihistamines). A spray that treats swelling of the nostrils.  Rinses that help get rid of thick mucus in your nose (nasal saline washes). If caused by bacteria, your doctor may wait to see if you will get better without treatment. You may be given antibiotic medicine if you have: A very bad infection. A weak body defense system. If caused by growths in the nose, you may need to have surgery. Follow these  instructions at home: Medicines Take, use, or apply over-the-counter and prescription medicines only as told by your doctor. These may include nasal sprays. If you were prescribed an antibiotic medicine, take it as told by your doctor. Do not stop taking the antibiotic even if you start to feel better. Hydrate and humidify  Drink enough water to keep your pee (urine) pale yellow. Use a cool mist humidifier to keep the humidity level in your home above 50%. Breathe in steam for 10-15 minutes, 3-4 times a day, or as told by your doctor. You can do this in the bathroom while a hot shower is running. Try not to spend time in cool or dry air. Rest Rest as much as you can. Sleep with your head raised (elevated). Make sure you get enough sleep each night. General instructions  Put a warm, moist washcloth on your face 3-4 times a  day, or as often as told by your doctor. This will help with discomfort. Wash your hands often with soap and water. If there is no soap and water, use hand sanitizer. Do not smoke. Avoid being around people who are smoking (secondhand smoke). Keep all follow-up visits as told by your doctor. This is important. Contact a doctor if: You have a fever. Your symptoms get worse. Your symptoms do not get better within 10 days. Get help right away if: You have a very bad headache. You cannot stop throwing up (vomiting). You have very bad pain or swelling around your face or eyes. You have trouble seeing. You feel confused. Your neck is stiff. You have trouble breathing. Summary Sinusitis is swelling of your sinuses. Sinuses are hollow spaces in the bones around your face. This condition is caused by tissues in your nose that become inflamed or swollen. This traps germs. These can lead to infection. If you were prescribed an antibiotic medicine, take it as told by your doctor. Do not stop taking it even if you start to feel better. Keep all follow-up visits as told by  your doctor. This is important. This information is not intended to replace advice given to you by your health care provider. Make sure you discuss any questions you have with your health care provider. Document Revised: 09/26/2017 Document Reviewed: 09/26/2017 Elsevier Patient Education  2022 [employer].    If you have been instructed to have an in-person evaluation today at a local Urgent Care facility, please use the link below. It will take you to a list of all of our available [facility], including address, phone number and hours of operation. Please do not delay care.  [facility]  If you or a family member do not have a primary care provider, use the link below to schedule a visit and establish care. When you choose a [facility] primary care physician or advanced practice provider, you gain a long-term partner in health. Find a Primary Care Provider  Learn more about [facility]'s in-office and virtual care options: [facility]

## 2021-01-12 NOTE — Progress Notes (Signed)
E-Visit for Sinus Problems  We are sorry that you are not feeling well.  Here is how we plan to help!  Based on what you have shared with me it looks like you have sinusitis.  Sinusitis is inflammation and infection in the sinus cavities of the head.  Based on your presentation I believe you most likely have Acute Bacterial Sinusitis.  This is an infection caused by bacteria and is treated with antibiotics. I have prescribed Doxycycline '100mg'$  by mouth twice a day for 10 days. You may use an oral decongestant such as plain Mucinex. Saline nasal spray help and can safely be used as often as needed for congestion.  We will also prescribe Flonase nasal spray. If you develop worsening sinus pain, fever or notice severe headache and vision changes, or if symptoms are not better after completion of antibiotic, please schedule an appointment with a health care provider.    Meds ordered this encounter  Medications   doxycycline (VIBRA-TABS) 100 MG tablet    Sig: Take 1 tablet (100 mg total) by mouth 2 (two) times daily for 10 days.    Dispense:  20 tablet    Refill:  0   fluticasone (FLONASE) 50 MCG/ACT nasal spray    Sig: Place 2 sprays into both nostrils daily.    Dispense:  16 g    Refill:  0     Sinus infections are not as easily transmitted as other respiratory infection, however we still recommend that you avoid close contact with loved ones, especially the very young and elderly.  Remember to wash your hands thoroughly throughout the day as this is the number one way to prevent the spread of infection!  Home Care: Only take medications as instructed by your medical team. Complete the entire course of an antibiotic. Do not take these medications with alcohol. A steam or ultrasonic humidifier can help congestion.  You can place a towel over your head and breathe in the steam from hot water coming from a faucet. Avoid close contacts especially the very young and the elderly. Cover your mouth  when you cough or sneeze. Always remember to wash your hands.  Get Help Right Away If: You develop worsening fever or sinus pain. You develop a severe head ache or visual changes. Your symptoms persist after you have completed your treatment plan.  Make sure you Understand these instructions. Will watch your condition. Will get help right away if you are not doing well or get worse.  Thank you for choosing an e-visit.  Your e-visit answers were reviewed by a board certified advanced clinical practitioner to complete your personal care plan. Depending upon the condition, your plan could have included both over the counter or prescription medications.  Please review your pharmacy choice. Make sure the pharmacy is open so you can pick up prescription now. If there is a problem, you may contact your provider through [employer] and have the prescription routed to another pharmacy.  Your safety is important to [place]. If you have drug allergies check your prescription carefully.   For the next 24 hours you can use MyChart to ask questions about today's visit, request a non-urgent call back, or ask for a work or school excuse. You will get an email in the next two days asking about your experience. I hope that your e-visit has been valuable and will speed your recovery.   I spent approximately 7 minutes reviewing the patient's history, current symptoms and coordinating their  plan of care today.

## 2021-01-12 NOTE — Progress Notes (Signed)
Patient just needed pharmacy changed and a work note. Already completed e-visit today. Duplicate encounter.

## 2021-01-14 ENCOUNTER — Other Ambulatory Visit: Payer: Self-pay | Admitting: Nurse Practitioner

## 2021-01-14 DIAGNOSIS — J0140 Acute pansinusitis, unspecified: Secondary | ICD-10-CM

## 2021-01-14 MED ORDER — FLUTICASONE PROPIONATE 50 MCG/ACT NA SUSP: 2.0000 | Freq: Every day | NASAL | 0 refills | Status: DC

## 2021-01-16 ENCOUNTER — Telehealth: Payer: 59 | Admitting: Nurse Practitioner

## 2021-01-16 NOTE — Progress Notes (Signed)
Patient only needs work note for recent video visit

## 2021-03-20 ENCOUNTER — Telehealth: Payer: 59 | Admitting: Physician Assistant

## 2021-03-20 DIAGNOSIS — M5090 Cervical disc disorder, unspecified, unspecified cervical region: Secondary | ICD-10-CM | POA: Diagnosis not present

## 2021-03-20 MED ORDER — ETODOLAC 300 MG PO CAPS: 300.0000 mg | ORAL_CAPSULE | Freq: Three times a day (TID) | ORAL | 0 refills | Status: DC

## 2021-03-20 MED ORDER — CYCLOBENZAPRINE HCL 10 MG PO TABS: 10.0000 mg | ORAL_TABLET | Freq: Three times a day (TID) | ORAL | 0 refills | Status: DC | PRN

## 2021-03-20 NOTE — Progress Notes (Signed)

## 2021-04-23 ENCOUNTER — Encounter: Payer: Self-pay | Admitting: Emergency Medicine

## 2021-04-23 ENCOUNTER — Other Ambulatory Visit: Payer: Self-pay

## 2021-04-23 ENCOUNTER — Ambulatory Visit: Payer: 59 | Admitting: Emergency Medicine

## 2021-04-23 VITALS — BP 130/70 | HR 105 | Ht 75.0 in | Wt 289.0 lb

## 2021-04-23 DIAGNOSIS — E1159 Type 2 diabetes mellitus with other circulatory complications: Secondary | ICD-10-CM | POA: Diagnosis not present

## 2021-04-23 DIAGNOSIS — I152 Hypertension secondary to endocrine disorders: Secondary | ICD-10-CM

## 2021-04-23 DIAGNOSIS — E785 Hyperlipidemia, unspecified: Secondary | ICD-10-CM | POA: Diagnosis not present

## 2021-04-23 DIAGNOSIS — E1165 Type 2 diabetes mellitus with hyperglycemia: Secondary | ICD-10-CM

## 2021-04-23 DIAGNOSIS — E669 Obesity, unspecified: Secondary | ICD-10-CM

## 2021-04-23 DIAGNOSIS — N5201 Erectile dysfunction due to arterial insufficiency: Secondary | ICD-10-CM | POA: Diagnosis not present

## 2021-04-23 DIAGNOSIS — E1169 Type 2 diabetes mellitus with other specified complication: Secondary | ICD-10-CM | POA: Diagnosis not present

## 2021-04-23 DIAGNOSIS — Z23 Encounter for immunization: Secondary | ICD-10-CM | POA: Diagnosis not present

## 2021-04-23 LAB — COMPREHENSIVE METABOLIC PANEL
ALT: 23 U/L (ref 0–53)
AST: 17 U/L (ref 0–37)
Albumin: 4.2 g/dL (ref 3.5–5.2)
Alkaline Phosphatase: 60 U/L (ref 39–117)
BUN: 12 mg/dL (ref 6–23)
CO2: 32 mEq/L (ref 19–32)
Calcium: 9.9 mg/dL (ref 8.4–10.5)
Chloride: 101 mEq/L (ref 96–112)
Creatinine, Ser: 0.89 mg/dL (ref 0.40–1.50)
GFR: 108.66 mL/min (ref >60.00)
Glucose, Bld: 237 mg/dL — ABNORMAL HIGH (ref 70–99)
Potassium: 4 mEq/L (ref 3.5–5.1)
Sodium: 140 mEq/L (ref 135–145)
Total Bilirubin: 0.6 mg/dL (ref 0.2–1.2)
Total Protein: 7.6 g/dL (ref 6.0–8.3)

## 2021-04-23 LAB — LIPID PANEL
Cholesterol: 171 mg/dL (ref 0–200)
HDL: 40.2 mg/dL (ref >39.00)
LDL Cholesterol: 100 mg/dL — ABNORMAL HIGH (ref 0–99)
NonHDL: 131.1
Total CHOL/HDL Ratio: 4
Triglycerides: 156 mg/dL — ABNORMAL HIGH (ref 0.0–149.0)
VLDL: 31.2 mg/dL (ref 0.0–40.0)

## 2021-04-23 LAB — POCT GLYCOSYLATED HEMOGLOBIN (HGB A1C): Hemoglobin A1C: 10.3 % — AB (ref 4.0–5.6)

## 2021-04-23 MED ORDER — SILDENAFIL CITRATE 100 MG PO TABS
50.0000 mg | ORAL_TABLET | Freq: Every day | ORAL | 11 refills | Status: DC | PRN
Start: 2021-04-23 — End: 2022-03-08

## 2021-04-23 MED ORDER — FREESTYLE LIBRE 3 SENSOR MISC: 1.0000 "application " | Freq: Two times a day (BID) | 5 refills | Status: DC

## 2021-04-23 MED ORDER — TRULICITY 3 MG/0.5ML ~~LOC~~ SOAJ: 3.0000 mg | SUBCUTANEOUS | 5 refills | Status: DC

## 2021-04-23 NOTE — Progress Notes (Signed)
[name] 40 y.o.   Chief Complaint  Patient presents with   Diabetes    HISTORY OF PRESENT ILLNESS: This is a 40 y.o. male here for follow-up of diabetes and hypertension. #1 diabetes: On glipizide 10 mg twice a day.  Off Trulicity for the last 3 months.  States pharmacy did not have it. #2 hypertension: On Hyzaar 100-12.5 mg daily.  Did not take it this morning. Also complaining of erectile dysfunction. No other complaints or medical concerns today. Lab Results  Component Value Date   HGBA1C 8.6 (A) 09/18/2020   BP Readings from Last 3 Encounters:  04/23/21 140/82  09/18/20 126/86  06/10/20 123/79   Wt Readings from Last 3 Encounters:  04/23/21 289 lb (131.1 kg)  09/18/20 296 lb (134.3 kg)  06/10/20 (!) 306 lb (138.8 kg)      Diabetes Pertinent negatives for hypoglycemia include no dizziness or headaches. Pertinent negatives for diabetes include no chest pain.    Prior to Admission medications   Medication Sig Start Date End Date Taking? Authorizing Provider  acetaminophen (TYLENOL) 500 MG tablet Take 1,000 mg by mouth every 6 (six) hours as needed for mild pain.    [provider]  amLODipine (NORVASC) 10 MG tablet Take 1 tablet (10 mg total) by mouth daily. 02/07/20 05/07/20  Horald Pollen, MD  blood glucose meter kit and supplies Dispense based on patient and insurance preference. Use up to four times daily as directed. (FOR ICD-10 E10.9, E11.9). 01/16/19   Eugenie Filler, MD  cyclobenzaprine (FLEXERIL) 10 MG tablet Take 1 tablet (10 mg total) by mouth 3 (three) times daily as needed for muscle spasms. 03/20/21   Mar Daring, PA-C  doxylamine, Sleep, (UNISOM) 25 MG tablet Take 25 mg by mouth at bedtime as needed. Patient not taking: Reported on 09/18/2020    [provider]  Dulaglutide (TRULICITY) 3 TJ/0.3ES SOPN Inject 3 mg as directed once a week. 06/10/20   Horald Pollen, MD  etodolac (LODINE) 300 MG capsule Take 1  capsule (300 mg total) by mouth every 8 (eight) hours. 03/20/21   Mar Daring, PA-C  FLUoxetine (PROZAC) 20 MG capsule Take 20 mg by mouth daily. 12/20/19   [provider]  fluticasone (FLONASE) 50 MCG/ACT nasal spray Place 2 sprays into both nostrils daily. 01/14/21   Apolonio Schneiders, FNP  glipiZIDE (GLUCOTROL) 10 MG tablet Take 1 tablet (10 mg total) by mouth 2 (two) times daily before a meal. 02/07/20 05/07/20  Horald Pollen, MD  ibuprofen (ADVIL) 800 MG tablet Take 1 tablet (800 mg total) by mouth 3 (three) times daily. 10/05/19   Raylene Everts, MD  loratadine (CLARITIN) 10 MG tablet Take 10 mg by mouth as needed for allergies.    [provider]  losartan-hydrochlorothiazide (HYZAAR) 100-12.5 MG tablet Take 1 tablet by mouth daily. 02/07/20   Horald Pollen, MD  rosuvastatin (CRESTOR) 20 MG tablet Take 1 tablet (20 mg total) by mouth daily. 02/07/20 05/07/20  Horald Pollen, MD  traZODone (DESYREL) 100 MG tablet TAKE 1 TABLET BY MOUTH EVERY DAY AT BEDTIME AS NEEDED FOR SLEEP 12/10/19   [provider]    Allergies  Allergen Reactions   Amoxicillin Anaphylaxis and Other (See Comments)    Has patient had a PCN reaction causing immediate rash, facial/tongue/throat swelling, SOB or lightheadedness with hypotension: yes Has patient had a PCN reaction causing severe rash involving mucus membranes or skin necrosis: no Has patient  had a PCN reaction that required hospitalization yes Has patient had a PCN reaction occurring within the last 10 years: yes If all of the above answers are "NO", then may proceed with Cephalosporin use.   Penicillins Anaphylaxis and Other (See Comments)    Patient was told to list this as an allergy because he is allergic to Amoxicillin Has patient had a PCN reaction causing immediate rash, facial/tongue/throat swelling, SOB or lightheadedness with hypotension: yes for Amoxicillin Has patient had a PCN reaction causing  severe rash involving mucus membranes or skin necrosis: no Has patient had a PCN reaction that required hospitalization yes for Amoxicillin Has patient had a PCN reaction occurring within the last 10 years: yes for Amoxicillin If all o   Metformin And Related Other (See Comments)    SEVERE GI UPSET   Other Other (See Comments)    Powder in some gloves - localized itching but not allergic to latex, benadryl usually helps with this reaction   Latex Itching   Zolpidem Other (See Comments)    Passed out. And memory loss    Patient Active Problem List   Diagnosis Date Noted   Cervical disc disease 09/18/2020   Neck pain 04/17/2020   Diabetic ulcer of left midfoot associated with type 2 diabetes mellitus ([place]) 02/07/2020   Morbid obesity, unspecified obesity type ([place]) 01/10/2020   Hypertension associated with diabetes ([place]) 04/17/2018   Dyslipidemia 04/17/2018   PTSD (post-traumatic stress disorder) 08/11/2016   Spinal stenosis, lumbar region with neurogenic claudication 05/05/2016   Dysthymia 09/02/2015   Chronic low back pain 09/02/2015   Obesity, diabetes, and hypertension syndrome ([place]) 03/11/2008   UNSPECIFIED ANEMIA 03/11/2008    Past Medical History:  Diagnosis Date   Allergies    Allergy    Anemia    Anxiety    Depression    Diabetes mellitus    GERD (gastroesophageal reflux disease)    Hyperlipidemia    Hypertension    Lumbar disc herniation    Neuromuscular disorder (HCC)    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    PTSD (post-traumatic stress disorder)    Scoliosis    Sleep apnea     Past Surgical History:  Procedure Laterality Date   LUMBAR LAMINECTOMY/DECOMPRESSION MICRODISCECTOMY Left 05/05/2016   Procedure: CENTRAL DECOMPRESSION L4-L5 AND FORAMINOTOMY FOR L4 ROOT AND L5 ROOT ON THE LEFT;  Surgeon: Latanya Maudlin, MD;  Location: [facility];  Service: Orthopedics;  Laterality: Left;   NO PAST SURGERIES      Social History   Socioeconomic History   Marital status:  Divorced    Spouse name: Not on file   Number of children: Not on file   Years of education: Not on file   Highest education level: Not on file  Occupational History   Not on file  Tobacco Use   Smoking status: Former    Types: Cigars   Smokeless tobacco: Current    Types: Snuff  Vaping Use   Vaping Use: Never used  Substance and Sexual Activity   Alcohol use: Yes    Alcohol/week: 5.0 standard drinks    Types: 5 Shots of liquor per week    Comment: few times year   Drug use: No   Sexual activity: Not on file  Other Topics Concern   Not on file  Social History Narrative   Not on file   Social Determinants of Health   Financial Resource Strain: Not on file  Food Insecurity: Not on file  Transportation  Needs: Not on file  Physical Activity: Not on file  Stress: Not on file  Social Connections: Not on file  Intimate Partner Violence: Not on file    Family History  Problem Relation Age of Onset   Stroke Maternal Grandmother    Colon cancer Neg Hx    Esophageal cancer Neg Hx    Rectal cancer Neg Hx    Stomach cancer Neg Hx      Review of Systems  Constitutional: Negative.  Negative for chills and fever.  HENT: Negative.  Negative for congestion and sore throat.   Respiratory: Negative.  Negative for cough and shortness of breath.   Cardiovascular: Negative.  Negative for chest pain and palpitations.  Gastrointestinal: Negative.  Negative for abdominal pain, diarrhea, nausea and vomiting.  Genitourinary: Negative.  Negative for dysuria and hematuria.       Erectile dysfunction  Skin: Negative.  Negative for rash.  Neurological:  Negative for dizziness and headaches.  All other systems reviewed and are negative.  Today's Vitals   04/23/21 0856  BP: 140/82  Pulse: (!) 105  SpO2: 98%  Weight: 289 lb (131.1 kg)  Height: 6' 3"  (1.905 m)   Body mass index is 36.12 kg/m.  Physical Exam Vitals reviewed.  Constitutional:      Appearance: Normal appearance. He  is obese.  HENT:     Head: Normocephalic.  Eyes:     Extraocular Movements: Extraocular movements intact.     Conjunctiva/sclera: Conjunctivae normal.     Pupils: Pupils are equal, round, and reactive to light.  Cardiovascular:     Rate and Rhythm: Normal rate and regular rhythm.     Pulses: Normal pulses.     Heart sounds: Normal heart sounds.  Musculoskeletal:        General: Normal range of motion.     Cervical back: Normal range of motion and neck supple.     Right lower leg: No edema.     Left lower leg: No edema.  Skin:    General: Skin is warm and dry.     Capillary Refill: Capillary refill takes less than 2 seconds.  Neurological:     General: No focal deficit present.     Mental Status: He is alert and oriented to person, place, and time.  Psychiatric:        Mood and Affect: Mood normal.        Behavior: Behavior normal.   Results for orders placed or performed in visit on 04/23/21 (from the past 24 hour(s))  POCT glycosylated hemoglobin (Hb A1C)     Status: Abnormal   Collection Time: 04/23/21 10:49 AM  Result Value Ref Range   Hemoglobin A1C 10.3 (A) 4.0 - 5.6 %   HbA1c POC (<> result, manual entry)     HbA1c, POC (prediabetic range)     HbA1c, POC (controlled diabetic range)       ASSESSMENT & PLAN: A total of 47 minutes was spent with the patient and counseling/coordination of care regarding preparing for this visit, review of most recent office visit notes, review of most recent blood work results including today's hemoglobin A1c, review of all medications and changes made including addition of weekly Trulicity, education on nutrition, cardiovascular risks associated with uncontrolled diabetes, health maintenance items including need for flu vaccine, benefits of exercise, documentation, prognosis and need for follow-up.  Problem List Items Addressed This Visit       Cardiovascular and Mediastinum   Hypertension associated with  diabetes ([employer])    Elevated  blood pressure reading in the office because he did not take medication this morning.  Normal blood pressure readings at home. Continue Hyzaar 100-12.5 mg daily. Dietary approaches to stop hypertension discussed.      Relevant Medications   Dulaglutide (TRULICITY) 3 GS/8.1JS SOPN   sildenafil (VIAGRA) 100 MG tablet     Endocrine   Dyslipidemia associated with type 2 diabetes mellitus ([place]) - Primary    Elevated blood pressure reading in the office because he did not take medication this morning. Normal blood pressure readings at home. Continue Hyzaar 100-12.5 mg daily. Uncontrolled diabetes with hemoglobin A1c at 10.3 Presently only taking glipizide 10 mg twice a day. Needs to restart Trulicity 3 mg weekly. Diet and nutrition discussed. Losing weight and eating better. Follow-up in 3 months.      Relevant Medications   Dulaglutide (TRULICITY) 3 RP/5.9YV SOPN     Other   Dyslipidemia    Diet and nutrition discussed.  Lipid profile done today. Continue rosuvastatin 20 mg daily.       Relevant Orders   Lipid panel   Other Visit Diagnoses     Need for influenza vaccination       Relevant Orders   Flu Vaccine QUAD 69moIM (Fluarix, Fluzone & Alfiuria Quad PF) (Completed)   Type 2 diabetes mellitus with hyperglycemia, without long-term current use of insulin (HCC)       Relevant Medications   Dulaglutide (TRULICITY) 3 MOP/9.2TWSOPN   Continuous Blood Gluc Sensor (FREESTYLE LIBRE 3 SENSOR) MISC   Erectile dysfunction due to arterial insufficiency       Relevant Medications   sildenafil (VIAGRA) 100 MG tablet      Patient Instructions  Diabetes Mellitus and Nutrition, Adult When you have diabetes, or diabetes mellitus, it is very important to have healthy eating habits because your blood sugar (glucose) levels are greatly affected by what you eat and drink. Eating healthy foods in the right amounts, at about the same times every day, can help you: Manage your blood  glucose. Lower your risk of heart disease. Improve your blood pressure. Reach or maintain a healthy weight. What can affect my meal plan? Every person with diabetes is different, and each person has different needs for a meal plan. Your health care provider may recommend that you work with a dietitian to make a meal plan that is best for you. Your meal plan may vary depending on factors such as: The calories you need. The medicines you take. Your weight. Your blood glucose, blood pressure, and cholesterol levels. Your activity level. Other health conditions you have, such as heart or kidney disease. How do carbohydrates affect me? Carbohydrates, also called carbs, affect your blood glucose level more than any other type of food. Eating carbs raises the amount of glucose in your blood. It is important to know how many carbs you can safely have in each meal. This is different for every person. Your dietitian can help you calculate how many carbs you should have at each meal and for each snack. How does alcohol affect me? Alcohol can cause a decrease in blood glucose (hypoglycemia), especially if you use insulin or take certain diabetes medicines by mouth. Hypoglycemia can be a life-threatening condition. Symptoms of hypoglycemia, such as sleepiness, dizziness, and confusion, are similar to symptoms of having too much alcohol. Do not drink alcohol if: Your health care provider tells you not to drink. You are pregnant, may be pregnant,  or are planning to become pregnant. If you drink alcohol: Limit how much you have to: 0-1 drink a day for women. 0-2 drinks a day for men. Know how much alcohol is in your drink. In the [place] one drink equals one 12 oz bottle of beer (355 mL), one 5 oz glass of wine (148 mL), or one 1 oz glass of hard liquor (44 mL). Keep yourself hydrated with water, diet soda, or unsweetened iced tea. Keep in mind that regular soda, juice, and other mixers may contain a lot of  sugar and must be counted as carbs. What are tips for following this plan? Reading food labels Start by checking the serving size on the Nutrition Facts label of packaged foods and drinks. The number of calories and the amount of carbs, fats, and other nutrients listed on the label are based on one serving of the item. Many items contain more than one serving per package. Check the total grams (g) of carbs in one serving. Check the number of grams of saturated fats and trans fats in one serving. Choose foods that have a low amount or none of these fats. Check the number of milligrams (mg) of salt (sodium) in one serving. Most people should limit total sodium intake to less than 2,300 mg per day. Always check the nutrition information of foods labeled as "low-fat" or "nonfat." These foods may be higher in added sugar or refined carbs and should be avoided. Talk to your dietitian to identify your daily goals for nutrients listed on the label. Shopping Avoid buying canned, pre-made, or processed foods. These foods tend to be high in fat, sodium, and added sugar. Shop around the outside edge of the grocery store. This is where you will most often find fresh fruits and vegetables, bulk grains, fresh meats, and fresh dairy products. Cooking Use low-heat cooking methods, such as baking, instead of high-heat cooking methods, such as deep frying. Cook using healthy oils, such as olive, canola, or sunflower oil. Avoid cooking with butter, cream, or high-fat meats. Meal planning Eat meals and snacks regularly, preferably at the same times every day. Avoid going long periods of time without eating. Eat foods that are high in fiber, such as fresh fruits, vegetables, beans, and whole grains. Eat 4-6 oz (112-168 g) of lean protein each day, such as lean meat, chicken, fish, eggs, or tofu. One ounce (oz) (28 g) of lean protein is equal to: 1 oz (28 g) of meat, chicken, or fish. 1 egg.  cup (62 g) of  tofu. Eat some foods each day that contain healthy fats, such as avocado, nuts, seeds, and fish. What foods should I eat? Fruits Berries. Apples. Oranges. Peaches. Apricots. Plums. Grapes. Mangoes. Papayas. Pomegranates. Kiwi. Cherries. Vegetables Leafy greens, including lettuce, spinach, kale, chard, collard greens, mustard greens, and cabbage. Beets. Cauliflower. Broccoli. Carrots. Green beans. Tomatoes. Peppers. Onions. Cucumbers. Brussels sprouts. Grains Whole grains, such as whole-wheat or whole-grain bread, crackers, tortillas, cereal, and pasta. Unsweetened oatmeal. Quinoa. Brown or wild rice. Meats and other proteins Seafood. Poultry without skin. Lean cuts of poultry and beef. Tofu. Nuts. Seeds. Dairy Low-fat or fat-free dairy products such as milk, yogurt, and cheese. The items listed above may not be a complete list of foods and beverages you can eat and drink. Contact a dietitian for more information. What foods should I avoid? Fruits Fruits canned with syrup. Vegetables Canned vegetables. Frozen vegetables with butter or cream sauce. Grains Refined white flour and flour products such  as bread, pasta, snack foods, and cereals. Avoid all processed foods. Meats and other proteins Fatty cuts of meat. Poultry with skin. Breaded or fried meats. Processed meat. Avoid saturated fats. Dairy Full-fat yogurt, cheese, or milk. Beverages Sweetened drinks, such as soda or iced tea. The items listed above may not be a complete list of foods and beverages you should avoid. Contact a dietitian for more information. Questions to ask a health care provider Do I need to meet with a certified diabetes care and education specialist? Do I need to meet with a dietitian? What number can I call if I have questions? When are the best times to check my blood glucose? Where to find more information: American Diabetes Association: diabetes.org Academy of Nutrition and Dietetics:  eatright.[employer] of Diabetes and Digestive and Kidney Diseases: [employer] Association of Diabetes Care & Education Specialists: diabeteseducator.org Summary It is important to have healthy eating habits because your blood sugar (glucose) levels are greatly affected by what you eat and drink. It is important to use alcohol carefully. A healthy meal plan will help you manage your blood glucose and lower your risk of heart disease. Your health care provider may recommend that you work with a dietitian to make a meal plan that is best for you. This information is not intended to replace advice given to you by your health care provider. Make sure you discuss any questions you have with your health care provider. Document Revised: 11/28/2019 Document Reviewed: 11/28/2019 Elsevier Patient Education  2022 [place], MD [facility]

## 2021-04-23 NOTE — Assessment & Plan Note (Signed)
Elevated blood pressure reading in the office because he did not take medication this morning. Normal blood pressure readings at home. Continue Hyzaar 100-12.5 mg daily. Uncontrolled diabetes with hemoglobin A1c at 10.3 Presently only taking glipizide 10 mg twice a day. Needs to restart Trulicity 3 mg weekly. Diet and nutrition discussed. Losing weight and eating better. Follow-up in 3 months.

## 2021-04-23 NOTE — Assessment & Plan Note (Signed)
Diet and nutrition discussed. Lipid profile done today. Continue rosuvastatin 20 mg daily.  

## 2021-04-23 NOTE — Assessment & Plan Note (Signed)
Elevated blood pressure reading in the office because he did not take medication this morning.  Normal blood pressure readings at home. Continue Hyzaar 100-12.5 mg daily. Dietary approaches to stop hypertension discussed.

## 2021-04-23 NOTE — Patient Instructions (Signed)

## 2021-06-02 ENCOUNTER — Other Ambulatory Visit: Payer: Self-pay | Admitting: Emergency Medicine

## 2021-06-02 DIAGNOSIS — E785 Hyperlipidemia, unspecified: Secondary | ICD-10-CM

## 2021-06-02 DIAGNOSIS — I152 Hypertension secondary to endocrine disorders: Secondary | ICD-10-CM

## 2021-06-02 DIAGNOSIS — I10 Essential (primary) hypertension: Secondary | ICD-10-CM

## 2021-06-04 ENCOUNTER — Encounter: Payer: Self-pay | Admitting: Emergency Medicine

## 2021-06-10 ENCOUNTER — Other Ambulatory Visit: Payer: Self-pay | Admitting: Emergency Medicine

## 2021-06-10 MED ORDER — FLUOXETINE HCL 20 MG PO CAPS: 20.0000 mg | ORAL_CAPSULE | Freq: Every day | ORAL | 1 refills | Status: DC

## 2021-06-10 NOTE — Telephone Encounter (Signed)
New prescription for fluoxetine sent to pharmacy of record.  Thanks.

## 2021-06-14 ENCOUNTER — Telehealth: Payer: 59 | Admitting: Family

## 2021-06-14 DIAGNOSIS — M5090 Cervical disc disorder, unspecified, unspecified cervical region: Secondary | ICD-10-CM

## 2021-06-14 DIAGNOSIS — M5412 Radiculopathy, cervical region: Secondary | ICD-10-CM | POA: Diagnosis not present

## 2021-06-14 MED ORDER — GABAPENTIN 300 MG PO CAPS: 300.0000 mg | ORAL_CAPSULE | Freq: Three times a day (TID) | ORAL | 0 refills | Status: DC

## 2021-06-14 MED ORDER — IBUPROFEN 800 MG PO TABS: 800.0000 mg | ORAL_TABLET | Freq: Three times a day (TID) | ORAL | 0 refills | Status: DC

## 2021-06-14 NOTE — Progress Notes (Signed)
Virtual Visit Consent   [name], you are scheduled for a virtual visit with a [facility] provider today.     Just as with appointments in the office, your consent must be obtained to participate.  Your consent will be active for this visit and any virtual visit you may have with one of our providers in the next 365 days.     If you have a MyChart account, a copy of this consent can be sent to you electronically.  All virtual visits are billed to your insurance company just like a traditional visit in the office.    As this is a virtual visit, video technology does not allow for your provider to perform a traditional examination.  This may limit your provider's ability to fully assess your condition.  If your provider identifies any concerns that need to be evaluated in person or the need to arrange testing (such as labs, EKG, etc.), we will make arrangements to do so.     Although advances in technology are sophisticated, we cannot ensure that it will always work on either your end or our end.  If the connection with a video visit is poor, the visit may have to be switched to a telephone visit.  With either a video or telephone visit, we are not always able to ensure that we have a secure connection.     I need to obtain your verbal consent now.   Are you willing to proceed with your visit today?    [name] has provided verbal consent on 06/14/2021 for a virtual visit (video or telephone).   Evelina Dun, FNP   Date: 06/14/2021 8:42 AM   Virtual Visit via Video Note   I, Evelina Dun, connected with  [name]  ([id number], 1980/06/07) on 06/14/21 at  8:45 AM EST by a video-enabled telemedicine application and verified that I am speaking with the correct person using two identifiers.  Location: Patient: Virtual Visit Location Patient: Other: car Provider: Virtual Visit Location Provider: Home Office   I discussed the limitations of evaluation and management by  telemedicine and the availability of in person appointments. The patient expressed understanding and agreed to proceed.    History of Present Illness: [name] is a 41 y.o. who identifies as a male who was assigned male at birth, and is being seen today for neck and back pain. He has seen a Ortho and states he needs surgery, once his A1C gets stable.   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 He reports he has constant burning, tingling pain radiating down his left arm. He has taken motrin, tylenol, and narcotics without relief. He has also done PT with no relief.   Denies any blurred vision.    HPI: HPI  Problems:  Patient Active Problem List   Diagnosis Date Noted   Cervical disc disease 09/18/2020   Neck pain 04/17/2020   Diabetic ulcer of left midfoot associated with type 2 diabetes mellitus ([place]) 02/07/2020   Morbid obesity, unspecified obesity type ([place]) 01/10/2020   Hypertension associated with diabetes ([place]) 04/17/2018   Dyslipidemia 04/17/2018   PTSD (post-traumatic stress disorder) 08/11/2016   Spinal stenosis, lumbar region with neurogenic claudication 05/05/2016   Dysthymia 09/02/2015   Chronic low back pain 09/02/2015   Dyslipidemia associated with type 2 diabetes mellitus ([place]) 03/11/2008   UNSPECIFIED ANEMIA 03/11/2008    Allergies:  Allergies  Allergen Reactions   Amoxicillin Anaphylaxis  and Other (See Comments)    Has patient had a PCN reaction causing immediate rash, facial/tongue/throat swelling, SOB or lightheadedness with hypotension: yes Has patient had a PCN reaction causing severe rash involving mucus membranes or skin necrosis: no Has patient had a PCN reaction that required hospitalization yes Has patient had a PCN reaction occurring within the last 10 years: yes If all of the above answers are "NO", then may proceed with Cephalosporin use.   Penicillins Anaphylaxis and Other (See Comments)    Patient was told to list this as an  allergy because he is allergic to Amoxicillin Has patient had a PCN reaction causing immediate rash, facial/tongue/throat swelling, SOB or lightheadedness with hypotension: yes for Amoxicillin Has patient had a PCN reaction causing severe rash involving mucus membranes or skin necrosis: no Has patient had a PCN reaction that required hospitalization yes for Amoxicillin Has patient had a PCN reaction occurring within the last 10 years: yes for Amoxicillin If all o   Metformin And Related Other (See Comments)    SEVERE GI UPSET   Other Other (See Comments)    Powder in some gloves - localized itching but not allergic to latex, benadryl usually helps with this reaction   Latex Itching   Zolpidem Other (See Comments)    Passed out. And memory loss   Medications:  Current Outpatient Medications:    gabapentin (NEURONTIN) 300 MG capsule, Take 1 capsule (300 mg total) by mouth 3 (three) times daily., Disp: 90 capsule, Rfl: 0   acetaminophen (TYLENOL) 500 MG tablet, Take 1,000 mg by mouth every 6 (six) hours as needed for mild pain., Disp: , Rfl:    amLODipine (NORVASC) 10 MG tablet, Take 1 tablet (10 mg total) by mouth daily., Disp: 90 tablet, Rfl: 3   blood glucose meter kit and supplies, Dispense based on patient and insurance preference. Use up to four times daily as directed. (FOR ICD-10 E10.9, E11.9). (Patient not taking: Reported on 04/23/2021), Disp: 1 each, Rfl: 0   Continuous Blood Gluc Sensor (FREESTYLE LIBRE 3 SENSOR) MISC, 1 application by Does not apply route 2 (two) times daily. Place 1 sensor on the skin every 14 days. Use to check glucose continuously, Disp: 2 each, Rfl: 5   Dulaglutide (TRULICITY) 3 SW/1.0XN SOPN, Inject 3 mg as directed once a week., Disp: 6 mL, Rfl: 5   FLUoxetine (PROZAC) 20 MG capsule, Take 1 capsule (20 mg total) by mouth daily., Disp: 90 capsule, Rfl: 1   fluticasone (FLONASE) 50 MCG/ACT nasal spray, Place 2 sprays into both nostrils daily., Disp: 16 g, Rfl:  0   glipiZIDE (GLUCOTROL) 10 MG tablet, Take 1 tablet (10 mg total) by mouth 2 (two) times daily before a meal., Disp: 180 tablet, Rfl: 3   ibuprofen (ADVIL) 800 MG tablet, Take 1 tablet (800 mg total) by mouth 3 (three) times daily., Disp: 21 tablet, Rfl: 0   loratadine (CLARITIN) 10 MG tablet, Take 10 mg by mouth as needed for allergies., Disp: , Rfl:    losartan-hydrochlorothiazide (HYZAAR) 100-12.5 MG tablet, Take 1 tablet by mouth daily., Disp: 90 tablet, Rfl: 3   rosuvastatin (CRESTOR) 20 MG tablet, Take 1 tablet (20 mg total) by mouth daily., Disp: 90 tablet, Rfl: 3   sildenafil (VIAGRA) 100 MG tablet, Take 0.5-1 tablets (50-100 mg total) by mouth daily as needed for erectile dysfunction., Disp: 5 tablet, Rfl: 11   traZODone (DESYREL) 100 MG tablet, TAKE 1 TABLET BY MOUTH EVERY DAY AT BEDTIME AS  NEEDED FOR SLEEP, Disp: , Rfl:   Observations/Objective: Patient is well-developed, well-nourished in no acute distress.  Resting comfortably  in car Head is normocephalic, atraumatic.  No labored breathing.  Speech is clear and coherent with logical content.  Patient is alert and oriented at baseline.  Full ROM of neck  Assessment and Plan: 1. Cervical disc disease - ibuprofen (ADVIL) 800 MG tablet; Take 1 tablet (800 mg total) by mouth 3 (three) times daily.  Dispense: 21 tablet; Refill: 0 - gabapentin (NEURONTIN) 300 MG capsule; Take 1 capsule (300 mg total) by mouth 3 (three) times daily.  Dispense: 90 capsule; Refill: 0  2. Cervical radiculopathy at C7 - ibuprofen (ADVIL) 800 MG tablet; Take 1 tablet (800 mg total) by mouth 3 (three) times daily.  Dispense: 21 tablet; Refill: 0 - gabapentin (NEURONTIN) 300 MG capsule; Take 1 capsule (300 mg total) by mouth 3 (three) times daily.  Dispense: 90 capsule; Refill: 0  Keep Ortho follow up Low carb diet  Start gabapentin 300 mg TID  Sedation precautions discussed    Follow Up Instructions: I discussed the assessment and treatment plan  with the patient. The patient was provided an opportunity to ask questions and all were answered. The patient agreed with the plan and demonstrated an understanding of the instructions.  A copy of instructions were sent to the patient via MyChart unless otherwise noted below.     The patient was advised to call back or seek an in-person evaluation if the symptoms worsen or if the condition fails to improve as anticipated.  Time:  I spent 11 minutes with the patient via telehealth technology discussing the above problems/concerns.    Evelina Dun, FNP

## 2021-06-14 NOTE — Patient Instructions (Signed)
Cervical Radiculopathy Cervical radiculopathy happens when a nerve in the neck (a cervical nerve) is pinched or bruised. This condition can happen because of an injury to the cervical spine (vertebrae) in the neck, or as part of the normal aging process. Pressure on the cervical nerves can cause pain or numbness that travels from the neck all the way down to the arm and fingers. This condition usually gets better with rest. Treatment may be needed if the condition does not improve. What are the causes? This condition may be caused by: A neck injury. A bulging (herniated) disk. Muscle spasms. Muscle tightness in the neck due to overuse. Arthritis. Breakdown or degeneration in the bones and joints of the spine (spondylosis) due to aging. Bone spurs that may develop near the cervical nerves. What are the signs or symptoms? Symptoms of this condition include: Pain. The pain may travel from the neck to the arm and hand. The pain can be severe or irritating. It may get worse when you move your neck. Numbness or tingling in your arm or hand. Weakness in the affected arm and hand, in severe cases. How is this diagnosed? This condition may be diagnosed based on your symptoms, your medical history, and a physical exam. You may also have tests, including: X-rays. CT scan. MRI. Electromyogram (EMG). Nerve conduction tests. How is this treated? In many cases, treatment is not needed for this condition. With rest, the condition usually gets better over time. If treatment is needed, options may include: Wearing a soft neck collar (cervical collar) for short periods of time. Doing physical therapy to strengthen your neck muscles. Taking medicines. These may include NSAIDs, such as ibuprofen, or oral corticosteroids. Having spinal injections, in severe cases. Having surgery. This may be needed if other treatments do not help. Different types of surgery may be done depending on the cause of this  condition. Follow these instructions at home: If you have a cervical collar: Wear it as told by your health care provider. Remove it only as told by your health care provider. Ask your health care provider if you can remove the cervical collar for cleaning and bathing. If you are allowed to remove the collar for cleaning or bathing: Follow instructions from your health care provider about how to remove the collar safely. Clean the collar by wiping it with mild soap and water and drying it completely. Take out any removable pads in the collar every 1-2 days, and wash them by hand with soap and water. Let them air-dry completely before you put them back in the collar. Check your skin under the collar for irritation or sores. If you see any, tell your health care provider. Managing pain   Take over-the-counter and prescription medicines only as told by your health care provider. If directed, put ice on the affected area. To do this: If you have a soft neck collar, remove it as told by your health care provider. Put ice in a plastic bag. Place a towel between your skin and the bag. Leave the ice on for 20 minutes, 2-3 times a day. Remove the ice if your skin turns bright red. This is very important. If you cannot feel pain, heat, or cold, you have a greater risk of damage to the area. If applying ice does not help, you can try using heat. Use the heat source that your health care provider recommends, such as a moist heat pack or a heating pad. Place a towel between your skin and   the heat source. Leave the heat on for 20-30 minutes. Remove the heat if your skin turns bright red. This is especially important if you are unable to feel pain, heat, or cold. You have a greater risk of getting burned. Try a gentle neck and shoulder massage to help relieve symptoms. Activity Rest as needed. Return to your normal activities as told by your health care provider. Ask your health care provider what  activities are safe for you. Do stretching and strengthening exercises as told by your health care provider or your physical therapist. You may have to avoid lifting. Ask your health care provider how much you can safely lift. General instructions Use a flat pillow when you sleep. Do not drive while wearing a cervical collar. If you do not have a cervical collar, ask your health care provider if it is safe to drive while your neck heals. Ask your health care provider if the medicine prescribed to you requires you to avoid driving or using machinery. Do not use any products that contain nicotine or tobacco. These products include cigarettes, chewing tobacco, and vaping devices, such as e-cigarettes. If you need help quitting, ask your health care provider. Keep all follow-up visits. This is important. Contact a health care provider if: Your condition does not improve with treatment. Get help right away if: Your pain gets much worse and is not controlled with medicines. You have weakness or numbness in your hand, arm, face, or leg. You have a high fever. You have a stiff, rigid neck. You lose control of your bowels or your bladder (have incontinence). You have trouble with walking, balance, or speaking. Summary Cervical radiculopathy happens when a nerve in the neck is pinched or bruised. A nerve can get pinched from a bulging disk, arthritis, muscle spasms, or an injury to the neck. Symptoms include pain, tingling, or numbness radiating from the neck to the arm or hand. Weakness can also occur in severe cases. Treatment may include rest, wearing a cervical collar, and physical therapy. Medicines may be prescribed to help with pain. In severe cases, injections or surgery may be needed. This information is not intended to replace advice given to you by your health care provider. Make sure you discuss any questions you have with your health care provider. Document Revised: 10/30/2020 Document  Reviewed: 10/30/2020 Elsevier Patient Education  2022 [employer]  

## 2021-06-26 ENCOUNTER — Ambulatory Visit (HOSPITAL_BASED_OUTPATIENT_CLINIC_OR_DEPARTMENT_OTHER): Payer: 59 | Admitting: Orthopaedic Surgery

## 2021-06-26 ENCOUNTER — Other Ambulatory Visit: Payer: Self-pay

## 2021-06-26 DIAGNOSIS — M5030 Other cervical disc degeneration, unspecified cervical region: Secondary | ICD-10-CM

## 2021-06-26 NOTE — Progress Notes (Signed)
Chief Complaint: C7-T1 disc herniation     History of Present Illness:    [name] is a 41 y.o. 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  He endorses left-sided weakness as well as numbness that goes into the middle 3 digits of his hand.  He works as a Production designer, theatre/television/film.  He has previously tried physical therapy and states that the only thing that helped was cervical traction.  He takes Tylenol as well as Motrin and Flexeril.  Nothing appears to be helping at this time.  He has previously had injections which did not give him any type of long-lasting relief.  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    PTSD (post-traumatic stress disorder)    Scoliosis    Sleep apnea    Past Surgical History:  Procedure Laterality Date   LUMBAR LAMINECTOMY/DECOMPRESSION MICRODISCECTOMY Left 05/05/2016   Procedure: CENTRAL DECOMPRESSION L4-L5 AND FORAMINOTOMY FOR L4 ROOT AND L5 ROOT ON THE LEFT;  Surgeon: Latanya Maudlin, MD;  Location: [facility];  Service: Orthopedics;  Laterality: Left;   NO PAST SURGERIES     Social History   Socioeconomic History   Marital status: Divorced    Spouse name: Not on file   Number of children: Not on file   Years of education: Not on file   Highest education level: Not on file  Occupational History   Not on file  Tobacco Use   Smoking status: Former    Types: Cigars   Smokeless tobacco: Current    Types: Snuff  Vaping Use   Vaping Use: Never used  Substance and Sexual Activity    Alcohol use: Yes    Alcohol/week: 5.0 standard drinks    Types: 5 Shots of liquor per week    Comment: few times year   Drug use: No   Sexual activity: Not on file  Other Topics Concern   Not on file  Social History Narrative   Not on file   Social Determinants of Health   Financial Resource Strain: Not on file  Food Insecurity: Not on file  Transportation Needs: Not on file  Physical Activity: Not on file  Stress: Not on file  Social Connections: Not on file   Family History  Problem Relation Age of Onset   Stroke Maternal Grandmother    Colon cancer Neg Hx    Esophageal cancer Neg Hx    Rectal cancer Neg Hx    Stomach cancer Neg Hx    Allergies  Allergen Reactions   Amoxicillin Anaphylaxis and Other (See Comments)    Has patient had a PCN reaction causing immediate rash, facial/tongue/throat swelling, SOB or lightheadedness with hypotension: yes Has patient had a PCN reaction causing severe rash involving mucus membranes or skin necrosis: no Has patient  had a PCN reaction that required hospitalization yes Has patient had a PCN reaction occurring within the last 10 years: yes If all of the above answers are "NO", then may proceed with Cephalosporin use.   Penicillins Anaphylaxis and Other (See Comments)    Patient was told to list this as an allergy because he is allergic to Amoxicillin Has patient had a PCN reaction causing immediate rash, facial/tongue/throat swelling, SOB or lightheadedness with hypotension: yes for Amoxicillin Has patient had a PCN reaction causing severe rash involving mucus membranes or skin necrosis: no Has patient had a PCN reaction that required hospitalization yes for Amoxicillin Has patient had a PCN reaction occurring within the last 10 years: yes for Amoxicillin If all o   Metformin And Related Other (See Comments)    SEVERE GI UPSET   Other Other (See Comments)    Powder in some gloves - localized itching but not allergic to latex,  benadryl usually helps with this reaction   Latex Itching   Zolpidem Other (See Comments)    Passed out. And memory loss   Current Outpatient Medications  Medication Sig Dispense Refill   acetaminophen (TYLENOL) 500 MG tablet Take 1,000 mg by mouth every 6 (six) hours as needed for mild pain.     amLODipine (NORVASC) 10 MG tablet Take 1 tablet (10 mg total) by mouth daily. 90 tablet 3   blood glucose meter kit and supplies Dispense based on patient and insurance preference. Use up to four times daily as directed. (FOR ICD-10 E10.9, E11.9). (Patient not taking: Reported on 04/23/2021) 1 each 0   Continuous Blood Gluc Sensor (FREESTYLE LIBRE 3 SENSOR) MISC 1 application by Does not apply route 2 (two) times daily. Place 1 sensor on the skin every 14 days. Use to check glucose continuously 2 each 5   Dulaglutide (TRULICITY) 3 VP/7.1GG SOPN Inject 3 mg as directed once a week. 6 mL 5   FLUoxetine (PROZAC) 20 MG capsule Take 1 capsule (20 mg total) by mouth daily. 90 capsule 1   fluticasone (FLONASE) 50 MCG/ACT nasal spray Place 2 sprays into both nostrils daily. 16 g 0   gabapentin (NEURONTIN) 300 MG capsule Take 1 capsule (300 mg total) by mouth 3 (three) times daily. 90 capsule 0   glipiZIDE (GLUCOTROL) 10 MG tablet Take 1 tablet (10 mg total) by mouth 2 (two) times daily before a meal. 180 tablet 3   ibuprofen (ADVIL) 800 MG tablet Take 1 tablet (800 mg total) by mouth 3 (three) times daily. 21 tablet 0   loratadine (CLARITIN) 10 MG tablet Take 10 mg by mouth as needed for allergies.     losartan-hydrochlorothiazide (HYZAAR) 100-12.5 MG tablet Take 1 tablet by mouth daily. 90 tablet 3   rosuvastatin (CRESTOR) 20 MG tablet Take 1 tablet (20 mg total) by mouth daily. 90 tablet 3   sildenafil (VIAGRA) 100 MG tablet Take 0.5-1 tablets (50-100 mg total) by mouth daily as needed for erectile dysfunction. 5 tablet 11   traZODone (DESYREL) 100 MG tablet TAKE 1 TABLET BY MOUTH EVERY DAY AT BEDTIME AS  NEEDED FOR SLEEP     No current facility-administered medications for this visit.   No results found.  Review of Systems:   A ROS was performed including pertinent positives and negatives as documented in the HPI.  Physical Exam :   Constitutional: NAD and appears stated age Neurological: Alert and oriented Psych: Appropriate affect and cooperative There were no vitals taken for this visit.  Comprehensive Musculoskeletal Exam:    Positive Spurling on the left.  He has weakness with triceps extension as well as elbow flexion and wrist extension.  Sensation is diminished in the middle finger.  2+ radial pulse  Imaging:     I personally reviewed and interpreted the radiographs.   Assessment:   41 year old male with known C7-T1 disc herniation which is failed conservative management.  He has previously had a surgical opinion and was told that he was too risky for surgery given his elevated A1c.  I did discuss that while this would leave him at higher risk of something like an infection or lack of fusion following surgery, I do not necessarily believe that this would be a strict contraindication particularly given the fact that he is losing function and strength in the left arm.  At this time I would like to refer him to our neurosurgical colleagues to see if he would be a candidate for a cervical procedure.  I have asked him to please make sure that he is able to bring his MRI disc for this consultation as this would be most helpful.  Plan :    -Return to clinic as needed     I personally saw and evaluated the patient, and participated in the management and treatment plan.  Vanetta Mulders, MD Attending Physician, Orthopedic Surgery  This document was dictated using Dragon voice recognition software. A reasonable attempt at proof reading has been made to minimize errors.

## 2021-07-14 ENCOUNTER — Other Ambulatory Visit: Payer: Self-pay | Admitting: Neurosurgery

## 2021-07-14 DIAGNOSIS — M5412 Radiculopathy, cervical region: Secondary | ICD-10-CM

## 2021-07-15 ENCOUNTER — Telehealth: Payer: 59 | Admitting: Physician Assistant

## 2021-07-15 DIAGNOSIS — R197 Diarrhea, unspecified: Secondary | ICD-10-CM

## 2021-07-15 DIAGNOSIS — R112 Nausea with vomiting, unspecified: Secondary | ICD-10-CM

## 2021-07-15 MED ORDER — ONDANSETRON HCL 4 MG PO TABS: 4.0000 mg | ORAL_TABLET | Freq: Three times a day (TID) | ORAL | 0 refills | Status: DC | PRN

## 2021-07-15 NOTE — Progress Notes (Signed)
E-Visit for Vomiting ? ?We are sorry that you are not feeling well. Here is how we plan to help! ? ?Based on what you have shared with me it looks like you have a Virus that is irritating your GI tract.  Vomiting is the forceful emptying of a portion of the stomach's content through the mouth.  Although nausea and vomiting can make you feel miserable, it's important to remember that these are not diseases, but rather symptoms of an underlying illness.  When we treat short term symptoms, we always caution that any symptoms that persist should be fully evaluated in a medical office. ? ?I have written you a work excuse and sent this through Doctor, hospital. ? ?I have prescribed a medication that will help alleviate your symptoms and allow you to stay hydrated: ? ?Zofran 4 mg 1 tablet every 8 hours as needed for nausea and vomiting ? ?HOME CARE: ?Drink clear liquids.  This is very important! Dehydration (the lack of fluid) can lead to a serious complication.  Start off with 1 tablespoon every 5 minutes for 8 hours. ?You may begin eating bland foods after 8 hours without vomiting.  Start with saltine crackers, white bread, rice, mashed potatoes, applesauce. ?After 48 hours on a bland diet, you may resume a normal diet. ?Try to go to sleep.  Sleep often empties the stomach and relieves the need to vomit. ? ?GET HELP RIGHT AWAY IF: ? ?Your symptoms do not improve or worsen within 2 days after treatment. ?You have a fever for over 3 days. ?You cannot keep down fluids after trying the medication. ? ?MAKE SURE YOU: ? ?Understand these instructions. ?Will watch your condition. ?Will get help right away if you are not doing well or get worse. ? ? ?Thank you for choosing an e-visit. ? ?Your e-visit answers were reviewed by a board certified advanced clinical practitioner to complete your personal care plan. Depending upon the condition, your plan could have included both over the counter or prescription medications. ? ?Please  review your pharmacy choice. Make sure the pharmacy is open so you can pick up prescription now. If there is a problem, you may contact your provider through [employer] and have the prescription routed to another pharmacy.  Your safety is important to [place]. If you have drug allergies check your prescription carefully.  ? ?For the next 24 hours you can use MyChart to ask questions about today's visit, request a non-urgent call back, or ask for a work or school excuse. ?You will get an email in the next two days asking about your experience. I hope that your e-visit has been valuable and will speed your recovery. ? ?Approximately 5 minutes was spent documenting and reviewing patient's chart. ? ?

## 2021-07-16 ENCOUNTER — Telehealth: Payer: Self-pay

## 2021-07-16 NOTE — Telephone Encounter (Signed)
Lenna Sciara is Secretary/administrator to check on the PA for [facility]. ? ?Please return a call to her at [phone] ?

## 2021-07-17 NOTE — Telephone Encounter (Signed)
PA key: [id number]. PA for dexcom sensor, receiver,and transmitter was sent to insurance. ?

## 2021-07-22 ENCOUNTER — Encounter: Payer: Self-pay | Admitting: Emergency Medicine

## 2021-07-22 ENCOUNTER — Ambulatory Visit: Payer: 59 | Admitting: Emergency Medicine

## 2021-07-22 ENCOUNTER — Other Ambulatory Visit: Payer: Self-pay

## 2021-07-22 VITALS — BP 116/72 | HR 100 | Ht 75.0 in | Wt 300.0 lb

## 2021-07-22 DIAGNOSIS — I10 Essential (primary) hypertension: Secondary | ICD-10-CM | POA: Diagnosis not present

## 2021-07-22 DIAGNOSIS — F4310 Post-traumatic stress disorder, unspecified: Secondary | ICD-10-CM | POA: Diagnosis not present

## 2021-07-22 DIAGNOSIS — E669 Obesity, unspecified: Secondary | ICD-10-CM

## 2021-07-22 DIAGNOSIS — E1159 Type 2 diabetes mellitus with other circulatory complications: Secondary | ICD-10-CM | POA: Diagnosis not present

## 2021-07-22 DIAGNOSIS — E1169 Type 2 diabetes mellitus with other specified complication: Secondary | ICD-10-CM | POA: Diagnosis not present

## 2021-07-22 DIAGNOSIS — A084 Viral intestinal infection, unspecified: Secondary | ICD-10-CM | POA: Insufficient documentation

## 2021-07-22 LAB — POCT GLYCOSYLATED HEMOGLOBIN (HGB A1C): Hemoglobin A1C: 9 % — AB (ref 4.0–5.6)

## 2021-07-22 MED ORDER — EMPAGLIFLOZIN 10 MG PO TABS: 10.0000 mg | ORAL_TABLET | Freq: Every day | ORAL | 3 refills | Status: AC

## 2021-07-22 MED ORDER — VORTIOXETINE HBR 5 MG PO TABS: 5.0000 mg | ORAL_TABLET | Freq: Every day | ORAL | 3 refills | Status: DC

## 2021-07-22 NOTE — Progress Notes (Signed)
[name] ?41 y.o. ? ? ?Chief Complaint  ?Patient presents with  ? Diabetes  ?  F/u, pt states that he had a stomach bug and has diarrhea  ? Medication Management  ?  Pt would like to discuss anxiety medication  ? ? ?HISTORY OF PRESENT ILLNESS: ?This is a 41 y.o. male with history of diabetes here for follow-up. ?Presently taking weekly Trulicity 3 mg and glipizide 10 mg twice a day. ?Has been on Prozac for several weeks.  Has history of chronic anxiety.  Has been seeing therapist on a regular basis.  Requesting different medication. ?Also has a "stomach bug".  Complaining of diarrhea and intermittent abdominal cramping. ?Kids recently diagnosed with viral intestinal infection. ?No other complaints or medical concerns today. ? ?HPI ? ? ?Prior to Admission medications   ?Medication Sig Start Date End Date Taking? Authorizing Provider  ?acetaminophen (TYLENOL) 500 MG tablet Take 1,000 mg by mouth every 6 (six) hours as needed for mild pain.   Yes [provider]  ?amLODipine (NORVASC) 10 MG tablet Take 1 tablet (10 mg total) by mouth daily. 06/03/21 09/01/21 Yes Sanam Marmo, Ines Bloomer, MD  ?Continuous Blood Gluc Sensor (FREESTYLE LIBRE 3 SENSOR) MISC 1 application by Does not apply route 2 (two) times daily. Place 1 sensor on the skin every 14 days. Use to check glucose continuously 04/23/21  Yes Lunna Vogelgesang, Ines Bloomer, MD  ?Dulaglutide (TRULICITY) 3 PJ/0.9TO SOPN Inject 3 mg as directed once a week. 04/23/21  Yes Diyana Starrett, Ines Bloomer, MD  ?FLUoxetine (PROZAC) 20 MG capsule Take 1 capsule (20 mg total) by mouth daily. 06/10/21 09/08/21 Yes Virgilio Broadhead, Ines Bloomer, MD  ?fluticasone [facility]) 50 MCG/ACT nasal spray Place 2 sprays into both nostrils daily. 01/14/21  Yes Apolonio Schneiders, FNP  ?gabapentin (NEURONTIN) 300 MG capsule Take 1 capsule (300 mg total) by mouth 3 (three) times daily. 06/14/21  Yes Sharion Balloon, FNP  ?glipiZIDE (GLUCOTROL) 10 MG tablet Take 1 tablet (10 mg total) by mouth 2 (two) times daily  before a meal. 06/03/21 09/01/21 Yes Armstead Heiland, Ines Bloomer, MD  ?ibuprofen (ADVIL) 800 MG tablet Take 1 tablet (800 mg total) by mouth 3 (three) times daily. 06/14/21  Yes Sharion Balloon, FNP  ?loratadine (CLARITIN) 10 MG tablet Take 10 mg by mouth as needed for allergies.   Yes [provider]  ?losartan-hydrochlorothiazide (HYZAAR) 100-12.5 MG tablet Take 1 tablet by mouth daily. 06/03/21 09/01/21 Yes Baer Hinton, Ines Bloomer, MD  ?rosuvastatin (CRESTOR) 20 MG tablet Take 1 tablet (20 mg total) by mouth daily. 06/03/21 09/01/21 Yes Kensli Bowley, Ines Bloomer, MD  ?sildenafil (VIAGRA) 100 MG tablet Take 0.5-1 tablets (50-100 mg total) by mouth daily as needed for erectile dysfunction. 04/23/21  Yes SagardiaInes Bloomer, MD  ?blood glucose meter kit and supplies Dispense based on patient and insurance preference. Use up to four times daily as directed. (FOR ICD-10 E10.9, E11.9). ?Patient not taking: Reported on 04/23/2021 01/16/19   Eugenie Filler, MD  ?ondansetron (ZOFRAN) 4 MG tablet Take 1 tablet (4 mg total) by mouth every 8 (eight) hours as needed for nausea or vomiting. ?Patient not taking: Reported on 07/22/2021 07/15/21   Couture, Cortni S, PA-C  ? ? ?Allergies  ?Allergen Reactions  ? Amoxicillin Anaphylaxis and Other (See Comments)  ?  Has patient had a PCN reaction causing immediate rash, facial/tongue/throat swelling, SOB or lightheadedness with hypotension: yes ?Has patient had a PCN reaction causing severe rash involving mucus membranes or skin necrosis: no ?Has patient  had a PCN reaction that required hospitalization yes ?Has patient had a PCN reaction occurring within the last 10 years: yes ?If all of the above answers are "NO", then may proceed with Cephalosporin use.  ? Penicillins Anaphylaxis and Other (See Comments)  ?  Patient was told to list this as an allergy because he is allergic to Amoxicillin ?Has patient had a PCN reaction causing immediate rash, facial/tongue/throat swelling, SOB or  lightheadedness with hypotension: yes for Amoxicillin ?Has patient had a PCN reaction causing severe rash involving mucus membranes or skin necrosis: no ?Has patient had a PCN reaction that required hospitalization yes for Amoxicillin ?Has patient had a PCN reaction occurring within the last 10 years: yes for Amoxicillin ?If all o  ? Metformin And Related Other (See Comments)  ?  SEVERE GI UPSET  ? Other Other (See Comments)  ?  Powder in some gloves - localized itching but not allergic to latex, benadryl usually helps with this reaction  ? Latex Itching  ? Zolpidem Other (See Comments)  ?  Passed out. And memory loss  ? ? ?Patient Active Problem List  ? Diagnosis Date Noted  ? Cervical disc disease 09/18/2020  ? Neck pain 04/17/2020  ? Diabetic ulcer of left midfoot associated with type 2 diabetes mellitus ([place]) 02/07/2020  ? Morbid obesity, unspecified obesity type ([place]) 01/10/2020  ? Hypertension associated with diabetes ([place]) 04/17/2018  ? Dyslipidemia 04/17/2018  ? PTSD (post-traumatic stress disorder) 08/11/2016  ? 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 Dysthymia 09/02/2015  ? Chronic low back pain 09/02/2015  ? Dyslipidemia associated with type 2 diabetes mellitus ([place]) 03/11/2008  ? UNSPECIFIED ANEMIA 03/11/2008  ? ? ?Past Medical History:  ?Diagnosis Date  ? Allergies   ? Allergy   ? Anemia   ? Anxiety   ? Depression   ? Diabetes mellitus   ? GERD (gastroesophageal reflux disease)   ? Hyperlipidemia   ? Hypertension   ? Lumbar disc herniation   ? Neuromuscular disorder ([place])   ? 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 PTSD (post-traumatic stress disorder)   ? Scoliosis   ? Sleep apnea   ? ? ?Past Surgical History:  ?Procedure Laterality Date  ? LUMBAR LAMINECTOMY/DECOMPRESSION MICRODISCECTOMY Left 05/05/2016  ? Procedure: CENTRAL DECOMPRESSION L4-L5 AND FORAMINOTOMY FOR L4 ROOT AND L5 ROOT ON THE LEFT;  Surgeon: Latanya Maudlin, MD;  Location: [facility];  Service: Orthopedics;   Laterality: Left;  ? NO PAST SURGERIES    ? ? ?Social History  ? ?Socioeconomic History  ? Marital status: Divorced  ?  Spouse name: Not on file  ? Number of children: Not on file  ? Years of education: Not on file  ? Highest education level: Not on file  ?Occupational History  ? Not on file  ?Tobacco Use  ? Smoking status: Former  ?  Types: Cigars  ? Smokeless tobacco: Current  ?  Types: Snuff  ?Vaping Use  ? Vaping Use: Never used  ?Substance and Sexual Activity  ? Alcohol use: Yes  ?  Alcohol/week: 5.0 standard drinks  ?  Types: 5 Shots of liquor per week  ?  Comment: few times year  ? Drug use: No  ? Sexual activity: Not on file  ?Other Topics Concern  ? Not on file  ?Social History Narrative  ? Not on file  ? ?Social Determinants of Health  ? ?Financial Resource Strain: Not on file  ?Food Insecurity: Not on file  ?  Transportation Needs: Not on file  ?Physical Activity: Not on file  ?Stress: Not on file  ?Social Connections: Not on file  ?Intimate Partner Violence: Not on file  ? ? ?Family History  ?Problem Relation Age of Onset  ? Stroke Maternal Grandmother   ? Colon cancer Neg Hx   ? Esophageal cancer Neg Hx   ? Rectal cancer Neg Hx   ? Stomach cancer Neg Hx   ? ? ? ?Review of Systems  ?Constitutional: Negative.  Negative for chills and fever.  ?HENT: Negative.  Negative for congestion.   ?Respiratory: Negative.  Negative for cough and shortness of breath.   ?Cardiovascular: Negative.  Negative for chest pain and palpitations.  ?Gastrointestinal:  Positive for abdominal pain, diarrhea and nausea. Negative for blood in stool, melena and vomiting.  ?Genitourinary: Negative.   ?Skin: Negative.  Negative for rash.  ?Neurological: Negative.  Negative for dizziness and headaches.  ?All other systems reviewed and are negative. ?Today's Vitals  ? 07/22/21 0827  ?BP: 116/72  ?Pulse: 100  ?SpO2: 97%  ?Weight: 300 lb (136.1 kg)  ?Height: 6' 3"  (1.905 m)  ? ?Body mass index is 37.5 kg/m?. ?Wt Readings from Last 3  Encounters:  ?07/22/21 300 lb (136.1 kg)  ?04/23/21 289 lb (131.1 kg)  ?09/18/20 296 lb (134.3 kg)  ? ? ? ?Physical Exam ?Vitals reviewed.  ?Constitutional:   ?   Appearance: Normal appearance. He is obese.  ?HENT:  ?   He

## 2021-07-22 NOTE — Assessment & Plan Note (Signed)
Running its course.  Advised to stay well-hydrated and avoid hard to digest foods. ?BRAT diet recommended. ?May take Imodium as needed for diarrhea. ?

## 2021-07-22 NOTE — Patient Instructions (Signed)

## 2021-07-22 NOTE — Assessment & Plan Note (Signed)
Creating chronic anxiety.  Prozac not working for patient.  Sexual side effects and weight gain. ?We will try Trintellix 10 mg daily. ?

## 2021-07-22 NOTE — Assessment & Plan Note (Signed)
Stable.  Continue rosuvastatin 20 mg daily. ?

## 2021-07-22 NOTE — Assessment & Plan Note (Signed)
Well-controlled hypertension.  Continue Hyzaar 100-12.5 mg daily. ?Uncontrolled diabetes with hemoglobin A1c of 9.0.  Better than before. ?Continue weekly Trulicity 3 mg and glipizide 10 mg twice a day. ?We will start Jardiance or Farxiga as per insurance's formulary. ?Diet and nutrition discussed. ?Follow-up in 3 months. ?

## 2021-07-29 ENCOUNTER — Ambulatory Visit
Admission: RE | Admit: 2021-07-29 | Discharge: 2021-07-29 | Disposition: A | Discharge status: Home / Self Care | Payer: 59 | Source: Ambulatory Visit | Attending: Neurosurgery | Admitting: Neurosurgery

## 2021-07-29 ENCOUNTER — Other Ambulatory Visit: Payer: Self-pay

## 2021-07-29 DIAGNOSIS — M5412 Radiculopathy, cervical region: Secondary | ICD-10-CM

## 2021-09-01 ENCOUNTER — Encounter: Payer: Self-pay | Admitting: Emergency Medicine

## 2021-09-01 ENCOUNTER — Ambulatory Visit (INDEPENDENT_AMBULATORY_CARE_PROVIDER_SITE_OTHER): Payer: 59 | Admitting: Emergency Medicine

## 2021-09-01 VITALS — BP 116/80 | HR 100 | Temp 98.0°F | Ht 75.0 in | Wt 300.1 lb

## 2021-09-01 DIAGNOSIS — E1169 Type 2 diabetes mellitus with other specified complication: Secondary | ICD-10-CM

## 2021-09-01 DIAGNOSIS — T887XXA Unspecified adverse effect of drug or medicament, initial encounter: Secondary | ICD-10-CM | POA: Diagnosis not present

## 2021-09-01 DIAGNOSIS — F341 Dysthymic disorder: Secondary | ICD-10-CM

## 2021-09-01 DIAGNOSIS — E669 Obesity, unspecified: Secondary | ICD-10-CM

## 2021-09-01 DIAGNOSIS — F4310 Post-traumatic stress disorder, unspecified: Secondary | ICD-10-CM | POA: Diagnosis not present

## 2021-09-01 DIAGNOSIS — E1159 Type 2 diabetes mellitus with other circulatory complications: Secondary | ICD-10-CM | POA: Diagnosis not present

## 2021-09-01 DIAGNOSIS — I152 Hypertension secondary to endocrine disorders: Secondary | ICD-10-CM

## 2021-09-01 DIAGNOSIS — E785 Hyperlipidemia, unspecified: Secondary | ICD-10-CM

## 2021-09-01 NOTE — Assessment & Plan Note (Signed)
Stable.  Diet and nutrition discussed.  Continue rosuvastatin 20 mg daily.  

## 2021-09-01 NOTE — Progress Notes (Signed)
[name] ?41 y.o. ? ? ?Chief Complaint  ?Patient presents with  ? Medication Management  ?  Pt was having hallucinations and "black out" while taking Trintellex. Need medical clearance to return to work   ? Hallucinations  ?  Still having them not as often   ? ? ?HISTORY OF PRESENT ILLNESS: ?This is a 41 y.o. male here for follow-up of visit on 07/22/2021 when I saw him for diabetes and hypertension follow-up. ?Patient has history of chronic anxiety and depression and wanted to stop Prozac and take something else.  Patient was started on Trintellix but developed side effects and did not tolerate it well.  Stopped medication about 6 days ago.  Had hallucinations and blacking out episodes. ?Still does not feel well enough to go back to work and I agree. ? ?HPI ? ? ?Prior to Admission medications   ?Medication Sig Start Date End Date Taking? Authorizing Provider  ?acetaminophen (TYLENOL) 500 MG tablet Take 1,000 mg by mouth every 6 (six) hours as needed for mild pain.   Yes [provider]  ?amLODipine (NORVASC) 10 MG tablet Take 1 tablet (10 mg total) by mouth daily. 06/03/21 09/01/21 Yes Meryl Ponder, Ines Bloomer, MD  ?blood glucose meter kit and supplies Dispense based on patient and insurance preference. Use up to four times daily as directed. (FOR ICD-10 E10.9, E11.9). 01/16/19  Yes Eugenie Filler, MD  ?Continuous Blood Gluc Sensor (FREESTYLE LIBRE 3 SENSOR) MISC 1 application by Does not apply route 2 (two) times daily. Place 1 sensor on the skin every 14 days. Use to check glucose continuously 04/23/21  Yes Margareth Kanner, Ines Bloomer, MD  ?Dulaglutide (TRULICITY) 3 WU/9.8JX SOPN Inject 3 mg as directed once a week. 04/23/21  Yes Ernisha Sorn, Ines Bloomer, MD  ?empagliflozin (JARDIANCE) 10 MG TABS tablet Take 1 tablet (10 mg total) by mouth daily before breakfast. 07/22/21 10/20/21 Yes Sapir Lavey, Ines Bloomer, MD  ?fluticasone (FLONASE) 50 MCG/ACT nasal spray Place 2 sprays into both nostrils daily. 01/14/21  Yes  Apolonio Schneiders, FNP  ?gabapentin (NEURONTIN) 300 MG capsule Take 1 capsule (300 mg total) by mouth 3 (three) times daily. 06/14/21  Yes Sharion Balloon, FNP  ?glipiZIDE (GLUCOTROL) 10 MG tablet Take 1 tablet (10 mg total) by mouth 2 (two) times daily before a meal. 06/03/21 09/01/21 Yes Daveyon Kitchings, Ines Bloomer, MD  ?loratadine (CLARITIN) 10 MG tablet Take 10 mg by mouth as needed for allergies.   Yes [provider]  ?losartan-hydrochlorothiazide (HYZAAR) 100-12.5 MG tablet Take 1 tablet by mouth daily. 06/03/21 09/01/21 Yes Kinnie Kaupp, Ines Bloomer, MD  ?ondansetron (ZOFRAN) 4 MG tablet Take 1 tablet (4 mg total) by mouth every 8 (eight) hours as needed for nausea or vomiting. 07/15/21  Yes Couture, Cortni S, PA-C  ?rosuvastatin (CRESTOR) 20 MG tablet Take 1 tablet (20 mg total) by mouth daily. 06/03/21 09/01/21 Yes Kenya Shiraishi, Ines Bloomer, MD  ?sildenafil (VIAGRA) 100 MG tablet Take 0.5-1 tablets (50-100 mg total) by mouth daily as needed for erectile dysfunction. 04/23/21  Yes Horald Pollen, MD  ? ? ?Allergies  ?Allergen Reactions  ? Amoxicillin Anaphylaxis and Other (See Comments)  ?  Has patient had a PCN reaction causing immediate rash, facial/tongue/throat swelling, SOB or lightheadedness with hypotension: yes ?Has patient had a PCN reaction causing severe rash involving mucus membranes or skin necrosis: no ?Has patient had a PCN reaction that required hospitalization yes ?Has patient had a PCN reaction occurring within the last 10 years: yes ?If all of  the above answers are "NO", then may proceed with Cephalosporin use.  ? Penicillins Anaphylaxis and Other (See Comments)  ?  Patient was told to list this as an allergy because he is allergic to Amoxicillin ?Has patient had a PCN reaction causing immediate rash, facial/tongue/throat swelling, SOB or lightheadedness with hypotension: yes for Amoxicillin ?Has patient had a PCN reaction causing severe rash involving mucus membranes or skin necrosis: no ?Has  patient had a PCN reaction that required hospitalization yes for Amoxicillin ?Has patient had a PCN reaction occurring within the last 10 years: yes for Amoxicillin ?If all o  ? Metformin And Related Other (See Comments)  ?  SEVERE GI UPSET  ? Other Other (See Comments)  ?  Powder in some gloves - localized itching but not allergic to latex, benadryl usually helps with this reaction  ? Latex Itching  ? Zolpidem Other (See Comments)  ?  Passed out. And memory loss  ? ? ?Patient Active Problem List  ? Diagnosis Date Noted  ? Viral gastroenteritis 07/22/2021  ? Cervical disc disease 09/18/2020  ? Neck pain 04/17/2020  ? Diabetic ulcer of left midfoot associated with type 2 diabetes mellitus ([place]) 02/07/2020  ? Morbid obesity, unspecified obesity type ([place]) 01/10/2020  ? Hypertension associated with diabetes ([place]) 04/17/2018  ? Dyslipidemia 04/17/2018  ? PTSD (post-traumatic stress disorder) 08/11/2016  ? 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 Dysthymia 09/02/2015  ? Chronic low back pain 09/02/2015  ? Dyslipidemia associated with type 2 diabetes mellitus ([place]) 03/11/2008  ? UNSPECIFIED ANEMIA 03/11/2008  ? ? ?Past Medical History:  ?Diagnosis Date  ? Allergies   ? Allergy   ? Anemia   ? Anxiety   ? Depression   ? Diabetes mellitus   ? GERD (gastroesophageal reflux disease)   ? Hyperlipidemia   ? Hypertension   ? Lumbar disc herniation   ? Neuromuscular disorder ([place])   ? 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 PTSD (post-traumatic stress disorder)   ? Scoliosis   ? Sleep apnea   ? ? ?Past Surgical History:  ?Procedure Laterality Date  ? LUMBAR LAMINECTOMY/DECOMPRESSION MICRODISCECTOMY Left 05/05/2016  ? Procedure: CENTRAL DECOMPRESSION L4-L5 AND FORAMINOTOMY FOR L4 ROOT AND L5 ROOT ON THE LEFT;  Surgeon: Latanya Maudlin, MD;  Location: [facility];  Service: Orthopedics;  Laterality: Left;  ? NO PAST SURGERIES    ? ? ?Social History  ? ?Socioeconomic History  ? Marital status: Divorced  ?   Spouse name: Not on file  ? Number of children: Not on file  ? Years of education: Not on file  ? Highest education level: Not on file  ?Occupational History  ? Not on file  ?Tobacco Use  ? Smoking status: Former  ?  Types: Cigars  ? Smokeless tobacco: Current  ?  Types: Snuff  ?Vaping Use  ? Vaping Use: Never used  ?Substance and Sexual Activity  ? Alcohol use: Yes  ?  Alcohol/week: 5.0 standard drinks  ?  Types: 5 Shots of liquor per week  ?  Comment: few times year  ? Drug use: No  ? Sexual activity: Not on file  ?Other Topics Concern  ? Not on file  ?Social History Narrative  ? Not on file  ? ?Social Determinants of Health  ? ?Financial Resource Strain: Not on file  ?Food Insecurity: Not on file  ?Transportation Needs: Not on file  ?Physical Activity: Not on file  ?Stress: Not on file  ?Social  Connections: Not on file  ?Intimate Partner Violence: Not on file  ? ? ?Family History  ?Problem Relation Age of Onset  ? Stroke Maternal Grandmother   ? Colon cancer Neg Hx   ? Esophageal cancer Neg Hx   ? Rectal cancer Neg Hx   ? Stomach cancer Neg Hx   ? ? ? ?Review of Systems  ?Constitutional: Negative.  Negative for chills and fever.  ?HENT: Negative.  Negative for congestion and sore throat.   ?Respiratory: Negative.  Negative for cough and shortness of breath.   ?Cardiovascular: Negative.  Negative for chest pain and palpitations.  ?Gastrointestinal:  Negative for abdominal pain, nausea and vomiting.  ?Skin: Negative.  Negative for rash.  ?Neurological: Negative.  Negative for dizziness and headaches.  ?All other systems reviewed and are negative. ? ?Today's Vitals  ? 09/01/21 0906  ?BP: 116/80  ?Pulse: 100  ?Temp: 98 ?F (36.7 ?C)  ?TempSrc: Oral  ?SpO2: 98%  ?Weight: (!) 300 lb 2 oz (136.1 kg)  ?Height: 6' 3"  (1.905 m)  ? ?Body mass index is 37.51 kg/m?. ? ?Physical Exam ?Vitals reviewed.  ?Constitutional:   ?   Appearance: Normal appearance. He is obese.  ?HENT:  ?   Head: Normocephalic.  ?   Mouth/Throat:  ?    Mouth: Mucous membranes are moist.  ?   Pharynx: Oropharynx is clear.  ?Eyes:  ?   Extraocular Movements: Extraocular movements intact.  ?   Conjunctiva/sclera: Conjunctivae normal.  ?   Pupils: Pupils are equal, ro

## 2021-09-01 NOTE — Patient Instructions (Signed)

## 2021-09-01 NOTE — Assessment & Plan Note (Signed)
?   Well-controlled hypertension.  Continue Hyzaar 100-12.5 mg daily. ?Uncontrolled diabetes with hemoglobin A1c of 9.0.  Better than before. ?Continue weekly Trulicity 3 mg and glipizide 10 mg twice a day. ?Continue Jardiance 10 mg daily. ?Diet and nutrition discussed. ?Follow-up in 3 months  ? ? ?

## 2021-09-01 NOTE — Assessment & Plan Note (Signed)
Near syncopal episodes and hallucinations secondary to Trintellix. ?Stopped medication 1 week ago.  Feeling better. ?We will reevaluate next week. ?No work until then recommended. ?

## 2021-09-01 NOTE — Assessment & Plan Note (Signed)
Stable.  Sees psychologist on a regular basis.  Trintellix did not work for him. ?Has been on Prozac before may want to go back on it but not yet. ?

## 2021-09-08 ENCOUNTER — Encounter: Payer: Self-pay | Admitting: Emergency Medicine

## 2021-09-08 ENCOUNTER — Ambulatory Visit (INDEPENDENT_AMBULATORY_CARE_PROVIDER_SITE_OTHER): Payer: 59 | Admitting: Emergency Medicine

## 2021-09-08 VITALS — BP 116/72 | HR 98 | Temp 98.2°F | Ht 75.0 in | Wt 303.2 lb

## 2021-09-08 DIAGNOSIS — E785 Hyperlipidemia, unspecified: Secondary | ICD-10-CM | POA: Diagnosis not present

## 2021-09-08 DIAGNOSIS — T887XXA Unspecified adverse effect of drug or medicament, initial encounter: Secondary | ICD-10-CM | POA: Diagnosis not present

## 2021-09-08 DIAGNOSIS — I10 Essential (primary) hypertension: Secondary | ICD-10-CM | POA: Diagnosis not present

## 2021-09-08 DIAGNOSIS — F341 Dysthymic disorder: Secondary | ICD-10-CM

## 2021-09-08 DIAGNOSIS — E1159 Type 2 diabetes mellitus with other circulatory complications: Secondary | ICD-10-CM | POA: Diagnosis not present

## 2021-09-08 DIAGNOSIS — I152 Hypertension secondary to endocrine disorders: Secondary | ICD-10-CM

## 2021-09-08 MED ORDER — LOSARTAN POTASSIUM-HCTZ 100-12.5 MG PO TABS: 1.0000 | ORAL_TABLET | Freq: Every day | ORAL | 3 refills | Status: DC

## 2021-09-08 MED ORDER — AMLODIPINE BESYLATE 10 MG PO TABS: 10.0000 mg | ORAL_TABLET | Freq: Every day | ORAL | 3 refills | Status: DC

## 2021-09-08 MED ORDER — ROSUVASTATIN CALCIUM 20 MG PO TABS: 20.0000 mg | ORAL_TABLET | Freq: Every day | ORAL | 3 refills | Status: DC

## 2021-09-08 MED ORDER — BUPROPION HCL ER (XL) 150 MG PO TB24
150.0000 mg | ORAL_TABLET | Freq: Every day | ORAL | 3 refills | Status: DC
Start: 2021-09-08 — End: 2022-03-25

## 2021-09-08 MED ORDER — GLIPIZIDE 10 MG PO TABS: 10.0000 mg | ORAL_TABLET | Freq: Two times a day (BID) | ORAL | 3 refills | Status: DC

## 2021-09-08 NOTE — Patient Instructions (Signed)
Health Maintenance, Male Adopting a healthy lifestyle and getting preventive care are important in promoting health and wellness. Ask your health care provider about: The right schedule for you to have regular tests and exams. Things you can do on your own to prevent diseases and keep yourself healthy. What should I know about diet, weight, and exercise? Eat a healthy diet  Eat a diet that includes plenty of vegetables, fruits, low-fat dairy products, and lean protein. Do not eat a lot of foods that are high in solid fats, added sugars, or sodium. Maintain a healthy weight Body mass index (BMI) is a measurement that can be used to identify possible weight problems. It estimates body fat based on height and weight. Your health care provider can help determine your BMI and help you achieve or maintain a healthy weight. Get regular exercise Get regular exercise. This is one of the most important things you can do for your health. Most adults should: Exercise for at least 150 minutes each week. The exercise should increase your heart rate and make you sweat (moderate-intensity exercise). Do strengthening exercises at least twice a week. This is in addition to the moderate-intensity exercise. Spend less time sitting. Even light physical activity can be beneficial. Watch cholesterol and blood lipids Have your blood tested for lipids and cholesterol at 39 years of age, then have this test every 5 years. You may need to have your cholesterol levels checked more often if: Your lipid or cholesterol levels are high. You are older than 40 years of age. You are at high risk for heart disease. What should I know about cancer screening? Many types of cancers can be detected early and may often be prevented. Depending on your health history and family history, you may need to have cancer screening at various ages. This may include screening for: Colorectal cancer. Prostate cancer. Skin cancer. Lung  cancer. What should I know about heart disease, diabetes, and high blood pressure? Blood pressure and heart disease High blood pressure causes heart disease and increases the risk of stroke. This is more likely to develop in people who have high blood pressure readings or are overweight. Talk with your health care provider about your target blood pressure readings. Have your blood pressure checked: Every 3-5 years if you are 18-39 years of age. Every year if you are 40 years old or older. If you are between the ages of 65 and 75 and are a current or former smoker, ask your health care provider if you should have a one-time screening for abdominal aortic aneurysm (AAA). Diabetes Have regular diabetes screenings. This checks your fasting blood sugar level. Have the screening done: Once every three years after age 45 if you are at a normal weight and have a low risk for diabetes. More often and at a younger age if you are overweight or have a high risk for diabetes. What should I know about preventing infection? Hepatitis B If you have a higher risk for hepatitis B, you should be screened for this virus. Talk with your health care provider to find out if you are at risk for hepatitis B infection. Hepatitis C Blood testing is recommended for: Everyone born from 1945 through 1965. Anyone with known risk factors for hepatitis C. Sexually transmitted infections (STIs) You should be screened each year for STIs, including gonorrhea and chlamydia, if: You are sexually active and are younger than 39 years of age. You are older than 39 years of age and your   health care provider tells you that you are at risk for this type of infection. Your sexual activity has changed since you were last screened, and you are at increased risk for chlamydia or gonorrhea. Ask your health care provider if you are at risk. Ask your health care provider about whether you are at high risk for HIV. Your health care provider  may recommend a prescription medicine to help prevent HIV infection. If you choose to take medicine to prevent HIV, you should first get tested for HIV. You should then be tested every 3 months for as long as you are taking the medicine. Follow these instructions at home: Alcohol use Do not drink alcohol if your health care provider tells you not to drink. If you drink alcohol: Limit how much you have to 0-2 drinks a day. Know how much alcohol is in your drink. In the [place] one drink equals one 12 oz bottle of beer (355 mL), one 5 oz glass of wine (148 mL), or one 1 oz glass of hard liquor (44 mL). Lifestyle Do not use any products that contain nicotine or tobacco. These products include cigarettes, chewing tobacco, and vaping devices, such as e-cigarettes. If you need help quitting, ask your health care provider. Do not use street drugs. Do not share needles. Ask your health care provider for help if you need support or information about quitting drugs. General instructions Schedule regular health, dental, and eye exams. Stay current with your vaccines. Tell your health care provider if: You often feel depressed. You have ever been abused or do not feel safe at home. Summary Adopting a healthy lifestyle and getting preventive care are important in promoting health and wellness. Follow your health care provider's instructions about healthy diet, exercising, and getting tested or screened for diseases. Follow your health care provider's instructions on monitoring your cholesterol and blood pressure. This information is not intended to replace advice given to you by your health care provider. Make sure you discuss any questions you have with your health care provider. Document Revised: 09/15/2020 Document Reviewed: 09/15/2020 Elsevier Patient Education  2023 Elsevier Inc.  

## 2021-09-08 NOTE — Assessment & Plan Note (Signed)
Still active.  We will start Wellbutrin 150 mg daily. ?

## 2021-09-08 NOTE — Progress Notes (Signed)
[name] ?41 y.o. ? ? ?Chief Complaint  ?Patient presents with  ? Follow-up  ?  No concerns no hallucinations , not lapse in memory   ? ? ?HISTORY OF PRESENT ILLNESS: ?This is a 41 y.o. male here for 1 week follow-up ?Had side effects related to Trintellix use.  No longer having any side effects.  Feels like he is back to normal. ?Has history of chronic depression and anxiety.  Has been on Wellbutrin in the past.  Would like to get back on it. ?No other complaints or medical concerns today. ? ?HPI ? ? ?Prior to Admission medications   ?Medication Sig Start Date End Date Taking? Authorizing Provider  ?acetaminophen (TYLENOL) 500 MG tablet Take 1,000 mg by mouth every 6 (six) hours as needed for mild pain.   Yes [provider]  ?blood glucose meter kit and supplies Dispense based on patient and insurance preference. Use up to four times daily as directed. (FOR ICD-10 E10.9, E11.9). 01/16/19  Yes Eugenie Filler, MD  ?buPROPion (WELLBUTRIN XL) 150 MG 24 hr tablet Take 1 tablet (150 mg total) by mouth daily. 09/08/21 09/03/22 Yes Shealee Yordy, Ines Bloomer, MD  ?Continuous Blood Gluc Sensor (FREESTYLE LIBRE 3 SENSOR) MISC 1 application by Does not apply route 2 (two) times daily. Place 1 sensor on the skin every 14 days. Use to check glucose continuously 04/23/21  Yes Samanyu Tinnell, Ines Bloomer, MD  ?cyclobenzaprine (FLEXERIL) 10 MG tablet Take 10 mg by mouth 3 (three) times daily as needed for muscle spasms.   Yes [provider]  ?Dulaglutide (TRULICITY) 3 GE/3.6OQ SOPN Inject 3 mg as directed once a week. 04/23/21  Yes Mc Bloodworth, Ines Bloomer, MD  ?empagliflozin (JARDIANCE) 10 MG TABS tablet Take 1 tablet (10 mg total) by mouth daily before breakfast. 07/22/21 10/20/21 Yes Nisa Decaire, Ines Bloomer, MD  ?fluticasone (FLONASE) 50 MCG/ACT nasal spray Place 2 sprays into both nostrils daily. 01/14/21  Yes Apolonio Schneiders, FNP  ?gabapentin (NEURONTIN) 300 MG capsule Take 1 capsule (300 mg total) by mouth 3 (three)  times daily. 06/14/21  Yes Sharion Balloon, FNP  ?loratadine (CLARITIN) 10 MG tablet Take 10 mg by mouth as needed for allergies.   Yes [provider]  ?ondansetron (ZOFRAN) 4 MG tablet Take 1 tablet (4 mg total) by mouth every 8 (eight) hours as needed for nausea or vomiting. 07/15/21  Yes Couture, Cortni S, PA-C  ?sildenafil (VIAGRA) 100 MG tablet Take 0.5-1 tablets (50-100 mg total) by mouth daily as needed for erectile dysfunction. 04/23/21  Yes Nahzir Pohle, Ines Bloomer, MD  ?amLODipine (NORVASC) 10 MG tablet Take 1 tablet (10 mg total) by mouth daily. 09/08/21 09/03/22  Horald Pollen, MD  ?glipiZIDE (GLUCOTROL) 10 MG tablet Take 1 tablet (10 mg total) by mouth 2 (two) times daily before a meal. 09/08/21 09/03/22  Augusten Lipkin, Ines Bloomer, MD  ?losartan-hydrochlorothiazide (HYZAAR) 100-12.5 MG tablet Take 1 tablet by mouth daily. 09/08/21 09/03/22  Horald Pollen, MD  ?rosuvastatin (CRESTOR) 20 MG tablet Take 1 tablet (20 mg total) by mouth daily. 09/08/21 09/03/22  Horald Pollen, MD  ? ? ?Allergies  ?Allergen Reactions  ? Amoxicillin Anaphylaxis and Other (See Comments)  ?  Has patient had a PCN reaction causing immediate rash, facial/tongue/throat swelling, SOB or lightheadedness with hypotension: yes ?Has patient had a PCN reaction causing severe rash involving mucus membranes or skin necrosis: no ?Has patient had a PCN reaction that required hospitalization yes ?Has patient had a PCN reaction occurring  within the last 10 years: yes ?If all of the above answers are "NO", then may proceed with Cephalosporin use.  ? Penicillins Anaphylaxis and Other (See Comments)  ?  Patient was told to list this as an allergy because he is allergic to Amoxicillin ?Has patient had a PCN reaction causing immediate rash, facial/tongue/throat swelling, SOB or lightheadedness with hypotension: yes for Amoxicillin ?Has patient had a PCN reaction causing severe rash involving mucus membranes or skin necrosis: no ?Has  patient had a PCN reaction that required hospitalization yes for Amoxicillin ?Has patient had a PCN reaction occurring within the last 10 years: yes for Amoxicillin ?If all o  ? Metformin And Related Other (See Comments)  ?  SEVERE GI UPSET  ? Other Other (See Comments)  ?  Powder in some gloves - localized itching but not allergic to latex, benadryl usually helps with this reaction  ? Latex Itching  ? Zolpidem Other (See Comments)  ?  Passed out. And memory loss  ? ? ?Patient Active Problem List  ? Diagnosis Date Noted  ? Medication side effects 09/01/2021  ? Viral gastroenteritis 07/22/2021  ? Cervical disc disease 09/18/2020  ? Neck pain 04/17/2020  ? Diabetic ulcer of left midfoot associated with type 2 diabetes mellitus ([place]) 02/07/2020  ? Morbid obesity, unspecified obesity type ([place]) 01/10/2020  ? Hypertension associated with diabetes ([place]) 04/17/2018  ? Dyslipidemia 04/17/2018  ? PTSD (post-traumatic stress disorder) 08/11/2016  ? 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 Dysthymia 09/02/2015  ? Chronic low back pain 09/02/2015  ? Dyslipidemia associated with type 2 diabetes mellitus ([place]) 03/11/2008  ? UNSPECIFIED ANEMIA 03/11/2008  ? ? ?Past Medical History:  ?Diagnosis Date  ? Allergies   ? Allergy   ? Anemia   ? Anxiety   ? Depression   ? Diabetes mellitus   ? GERD (gastroesophageal reflux disease)   ? Hyperlipidemia   ? Hypertension   ? Lumbar disc herniation   ? Neuromuscular disorder ([place])   ? 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 PTSD (post-traumatic stress disorder)   ? Scoliosis   ? Sleep apnea   ? ? ?Past Surgical History:  ?Procedure Laterality Date  ? LUMBAR LAMINECTOMY/DECOMPRESSION MICRODISCECTOMY Left 05/05/2016  ? Procedure: CENTRAL DECOMPRESSION L4-L5 AND FORAMINOTOMY FOR L4 ROOT AND L5 ROOT ON THE LEFT;  Surgeon: Latanya Maudlin, MD;  Location: [facility];  Service: Orthopedics;  Laterality: Left;  ? NO PAST SURGERIES    ? ? ?Social History  ? ?Socioeconomic History   ? Marital status: Divorced  ?  Spouse name: Not on file  ? Number of children: Not on file  ? Years of education: Not on file  ? Highest education level: Not on file  ?Occupational History  ? Not on file  ?Tobacco Use  ? Smoking status: Former  ?  Types: Cigars  ? Smokeless tobacco: Current  ?  Types: Snuff  ?Vaping Use  ? Vaping Use: Never used  ?Substance and Sexual Activity  ? Alcohol use: Yes  ?  Alcohol/week: 5.0 standard drinks  ?  Types: 5 Shots of liquor per week  ?  Comment: few times year  ? Drug use: No  ? Sexual activity: Not on file  ?Other Topics Concern  ? Not on file  ?Social History Narrative  ? Not on file  ? ?Social Determinants of Health  ? ?Financial Resource Strain: Not on file  ?Food Insecurity: Not on file  ?Transportation Needs: Not  on file  ?Physical Activity: Not on file  ?Stress: Not on file  ?Social Connections: Not on file  ?Intimate Partner Violence: Not on file  ? ? ?Family History  ?Problem Relation Age of Onset  ? Stroke Maternal Grandmother   ? Colon cancer Neg Hx   ? Esophageal cancer Neg Hx   ? Rectal cancer Neg Hx   ? Stomach cancer Neg Hx   ? ? ? ?Review of Systems  ?Constitutional: Negative.  Negative for chills and fever.  ?HENT: Negative.    ?Respiratory: Negative.  Negative for cough and shortness of breath.   ?Cardiovascular: Negative.  Negative for chest pain and palpitations.  ?Gastrointestinal: Negative.  Negative for abdominal pain, nausea and vomiting.  ?Genitourinary: Negative.   ?Skin: Negative.  Negative for rash.  ?Neurological: Negative.  Negative for dizziness and headaches.  ?All other systems reviewed and are negative. ? ?Today's Vitals  ? 09/08/21 1025  ?BP: 116/72  ?Pulse: 98  ?Temp: 98.2 ?F (36.8 ?C)  ?TempSrc: Oral  ?SpO2: 99%  ?Weight: (!) 303 lb 4 oz (137.6 kg)  ?Height: 6' 3"  (1.905 m)  ? ?Body mass index is 37.9 kg/m?. ? ?Physical Exam ?Vitals reviewed.  ?Constitutional:   ?   Appearance: Normal appearance.  ?HENT:  ?   Head: Normocephalic.  ?Eyes:  ?    Extraocular Movements: Extraocular movements intact.  ?   Pupils: Pupils are equal, round, and reactive to light.  ?Cardiovascular:  ?   Rate and Rhythm: Normal rate and regular rhythm.  ?   Pulses: Norm

## 2021-09-08 NOTE — Assessment & Plan Note (Signed)
Well-controlled hypertension. ?Continue amlodipine 10 mg and Hyzaar 100-12.5 mg daily. ?BP Readings from Last 3 Encounters:  ?09/08/21 116/72  ?09/01/21 116/80  ?07/22/21 116/72  ? ? ? ?

## 2021-09-08 NOTE — Assessment & Plan Note (Signed)
No longer present.  Able to return back to work full duties. ?

## 2021-09-16 ENCOUNTER — Telehealth: Payer: 59 | Admitting: Physician Assistant

## 2021-09-16 DIAGNOSIS — R197 Diarrhea, unspecified: Secondary | ICD-10-CM

## 2021-09-16 NOTE — Progress Notes (Signed)
Based on what you shared with me, I feel your condition warrants further evaluation and I recommend that you be seen for a face to face visit.  Please contact your primary care physician practice to be seen. Many offices offer virtual options to be seen via video if you are not comfortable going in person to a medical facility at this time. ? ?Being that this issue has been ongoing for more than 3 weeks and has not improved, you need re-evaluation in person. You most likely are going to require stool cultures and possibly even be referred to a GI specialist.  ? ?NOTE: You will NOT be charged for this eVisit. ? ?If you do not have a PCP, [facility] offers a free physician referral service available at [phone]. Our trained staff has the experience, knowledge and resources to put you in touch with a physician who is right for you.  ? ? ?If you are having a true medical emergency please call 911. ? ? ?Your e-visit answers were reviewed by a board certified advanced clinical practitioner to complete your personal care plan.  Thank you for using e-Visits. ? ? ?I provided 5 minutes of non face-to-face time during this encounter for chart review and documentation.  ? ?

## 2021-09-26 ENCOUNTER — Encounter (HOSPITAL_COMMUNITY): Payer: Self-pay | Admitting: Emergency Medicine

## 2021-09-26 DIAGNOSIS — Z794 Long term (current) use of insulin: Secondary | ICD-10-CM | POA: Insufficient documentation

## 2021-09-26 DIAGNOSIS — Z7984 Long term (current) use of oral hypoglycemic drugs: Secondary | ICD-10-CM | POA: Diagnosis not present

## 2021-09-26 DIAGNOSIS — Z79899 Other long term (current) drug therapy: Secondary | ICD-10-CM | POA: Insufficient documentation

## 2021-09-26 DIAGNOSIS — E119 Type 2 diabetes mellitus without complications: Secondary | ICD-10-CM | POA: Diagnosis not present

## 2021-09-26 DIAGNOSIS — Z91040 Latex allergy status: Secondary | ICD-10-CM | POA: Insufficient documentation

## 2021-09-26 DIAGNOSIS — I10 Essential (primary) hypertension: Secondary | ICD-10-CM | POA: Diagnosis not present

## 2021-09-26 DIAGNOSIS — R4182 Altered mental status, unspecified: Secondary | ICD-10-CM | POA: Diagnosis present

## 2021-09-26 NOTE — ED Triage Notes (Signed)
Pt BIB [facility] from work (he is an Animal nutritionist). He put in a call for something that wasn't there. He was seen talking to someone that wasn't there by a coworker. He is aware the incident happened, but cant recall it. He is A&Ox4. Hx of visual hallucinations and bizarre behavior. Started wellbutrin 1 week ago.

## 2021-09-27 ENCOUNTER — Emergency Department (HOSPITAL_COMMUNITY): Payer: 59

## 2021-09-27 ENCOUNTER — Emergency Department (HOSPITAL_COMMUNITY)
Admission: EM | Admit: 2021-09-27 | Discharge: 2021-09-27 | Disposition: A | Discharge status: Home / Self Care | Payer: 59 | Attending: Emergency Medicine | Admitting: Emergency Medicine

## 2021-09-27 DIAGNOSIS — R4182 Altered mental status, unspecified: Secondary | ICD-10-CM | POA: Diagnosis not present

## 2021-09-27 DIAGNOSIS — R404 Transient alteration of awareness: Secondary | ICD-10-CM

## 2021-09-27 LAB — CBC WITH DIFFERENTIAL/PLATELET
Abs Immature Granulocytes: 0.05 10*3/uL (ref 0.00–0.07)
Basophils Absolute: 0.1 10*3/uL (ref 0.0–0.1)
Basophils Relative: 0 %
Eosinophils Absolute: 0.4 10*3/uL (ref 0.0–0.5)
Eosinophils Relative: 3 %
HCT: 37.1 % — ABNORMAL LOW (ref 39.0–52.0)
Hemoglobin: 12.2 g/dL — ABNORMAL LOW (ref 13.0–17.0)
Immature Granulocytes: 0 %
Lymphocytes Relative: 21 %
Lymphs Abs: 2.3 10*3/uL (ref 0.7–4.0)
MCH: 27.4 pg (ref 26.0–34.0)
MCHC: 32.9 g/dL (ref 30.0–36.0)
MCV: 83.4 fL (ref 80.0–100.0)
Monocytes Absolute: 0.6 10*3/uL (ref 0.1–1.0)
Monocytes Relative: 5 %
Neutro Abs: 7.9 10*3/uL — ABNORMAL HIGH (ref 1.7–7.7)
Neutrophils Relative %: 71 %
Platelets: 249 10*3/uL (ref 150–400)
RBC: 4.45 MIL/uL (ref 4.22–5.81)
RDW: 13 % (ref 11.5–15.5)
WBC: 11.2 10*3/uL — ABNORMAL HIGH (ref 4.0–10.5)
nRBC: 0 % (ref 0.0–0.2)

## 2021-09-27 LAB — COMPREHENSIVE METABOLIC PANEL
ALT: 28 U/L (ref 0–44)
AST: 20 U/L (ref 15–41)
Albumin: 3.7 g/dL (ref 3.5–5.0)
Alkaline Phosphatase: 56 U/L (ref 38–126)
Anion gap: 7 (ref 5–15)
BUN: 17 mg/dL (ref 6–20)
CO2: 27 mmol/L (ref 22–32)
Calcium: 8.7 mg/dL — ABNORMAL LOW (ref 8.9–10.3)
Chloride: 105 mmol/L (ref 98–111)
Creatinine, Ser: 1.15 mg/dL (ref 0.61–1.24)
GFR, Estimated: >60 mL/min (ref >60)
Glucose, Bld: 329 mg/dL — ABNORMAL HIGH (ref 70–99)
Potassium: 3.8 mmol/L (ref 3.5–5.1)
Sodium: 139 mmol/L (ref 135–145)
Total Bilirubin: 0.5 mg/dL (ref 0.3–1.2)
Total Protein: 7.3 g/dL (ref 6.5–8.1)

## 2021-09-27 LAB — CBG MONITORING, ED: Glucose-Capillary: 276 mg/dL — ABNORMAL HIGH (ref 70–99)

## 2021-09-27 MED ORDER — SODIUM CHLORIDE 0.9 % IV BOLUS
1000.0000 mL | Freq: Once | INTRAVENOUS | Status: AC
  Administered 2021-09-27: 1000 mL via INTRAVENOUS

## 2021-09-27 MED ORDER — INSULIN ASPART 100 UNIT/ML IJ SOLN
6.0000 [IU] | Freq: Once | INTRAMUSCULAR | Status: DC
  Filled 2021-09-27: qty 0.06

## 2021-09-27 NOTE — Discharge Instructions (Signed)
Call your provider who prescribes her Wellbutrin on Monday to determine whether to continue taking this medication or to change to something different.  Follow-up with neurology in the next week.  A referral has been placed to [facility] neurology.  They should call to establish an appointment in the near future.

## 2021-09-27 NOTE — ED Provider Notes (Signed)
[facility] DEPT Provider Note   CSN: [id number] Arrival date & time: 09/26/21  2349     History  Chief Complaint  Patient presents with   Altered Mental Status    Transient    [name] is a 41 y.o. male.  Patient is a 41 year old male with past medical history of diabetes, hypertension, hyperlipidemia.  1 month ago, he he was started on a medication called Trintellix to treat for anxiety/depression.  Several days after initiating this medication, he had an episode at work that involved memory loss and confusion.  He discussed this with his prescribing physician who then changed his prescription to Wellbutrin.  He has been taking this Wellbutrin for the past 2 weeks.  This evening while at work, he experienced another episode where he became confused, seemed disoriented, and forgot what had transpired.  Patient works as a Counsellor for [employer] and apparently had a conversation with the caller that he has no recollection of.  He denies to me he is having any headache, weakness, numbness, visual disturbances.  He denies any drug or alcohol use.  The history is provided by the patient.  Altered Mental Status Presenting symptoms: confusion and memory loss   Severity:  Moderate Most recent episode: 1 month ago. Episode history:  Multiple Timing:  Intermittent     Home Medications Prior to Admission medications   Medication Sig Start Date End Date Taking? Authorizing Provider  acetaminophen (TYLENOL) 500 MG tablet Take 1,000 mg by mouth every 6 (six) hours as needed for mild pain.    [provider]  amLODipine (NORVASC) 10 MG tablet Take 1 tablet (10 mg total) by mouth daily. 09/08/21 09/03/22  Horald Pollen, MD  blood glucose meter kit and supplies Dispense based on patient and insurance preference. Use up to four times daily as directed. (FOR ICD-10 E10.9, E11.9). 01/16/19   Eugenie Filler, MD  buPROPion (WELLBUTRIN XL) 150 MG 24 hr  tablet Take 1 tablet (150 mg total) by mouth daily. 09/08/21 09/03/22  Horald Pollen, MD  Continuous Blood Gluc Sensor (FREESTYLE LIBRE 3 SENSOR) MISC 1 application by Does not apply route 2 (two) times daily. Place 1 sensor on the skin every 14 days. Use to check glucose continuously 04/23/21   Horald Pollen, MD  cyclobenzaprine (FLEXERIL) 10 MG tablet Take 10 mg by mouth 3 (three) times daily as needed for muscle spasms.    [provider]  Dulaglutide (TRULICITY) 3 SW/5.4OE SOPN Inject 3 mg as directed once a week. 04/23/21   Horald Pollen, MD  empagliflozin (JARDIANCE) 10 MG TABS tablet Take 1 tablet (10 mg total) by mouth daily before breakfast. 07/22/21 10/20/21  Horald Pollen, MD  fluticasone [facility]) 50 MCG/ACT nasal spray Place 2 sprays into both nostrils daily. 01/14/21   Apolonio Schneiders, FNP  gabapentin (NEURONTIN) 300 MG capsule Take 1 capsule (300 mg total) by mouth 3 (three) times daily. 06/14/21   Sharion Balloon, FNP  glipiZIDE (GLUCOTROL) 10 MG tablet Take 1 tablet (10 mg total) by mouth 2 (two) times daily before a meal. 09/08/21 09/03/22  Sagardia, Ines Bloomer, MD  loratadine (CLARITIN) 10 MG tablet Take 10 mg by mouth as needed for allergies.    [provider]  losartan-hydrochlorothiazide (HYZAAR) 100-12.5 MG tablet Take 1 tablet by mouth daily. 09/08/21 09/03/22  Horald Pollen, MD  ondansetron (ZOFRAN) 4 MG tablet Take 1 tablet (4 mg total) by mouth every 8 (eight)  hours as needed for nausea or vomiting. 07/15/21   Couture, Cortni S, PA-C  rosuvastatin (CRESTOR) 20 MG tablet Take 1 tablet (20 mg total) by mouth daily. 09/08/21 09/03/22  Horald Pollen, MD  sildenafil (VIAGRA) 100 MG tablet Take 0.5-1 tablets (50-100 mg total) by mouth daily as needed for erectile dysfunction. 04/23/21   Horald Pollen, MD      Allergies    Amoxicillin, Penicillins, Metformin and related, Other, Latex, and Zolpidem    Review of Systems    Review of Systems  Psychiatric/Behavioral:  Positive for confusion and memory loss.   All other systems reviewed and are negative.  Physical Exam Updated Vital Signs BP 131/81 (BP Location: Left Arm)   Pulse (!) 101   Temp 97.7 F (36.5 C) (Oral)   Resp 20   SpO2 99%  Physical Exam Vitals and nursing note reviewed.  Constitutional:      General: He is not in acute distress.    Appearance: He is well-developed. He is not diaphoretic.  HENT:     Head: Normocephalic and atraumatic.  Cardiovascular:     Rate and Rhythm: Normal rate and regular rhythm.     Heart sounds: No murmur heard.   No friction rub.  Pulmonary:     Effort: Pulmonary effort is normal. No respiratory distress.     Breath sounds: Normal breath sounds. No wheezing or rales.  Abdominal:     General: Bowel sounds are normal. There is no distension.     Palpations: Abdomen is soft.     Tenderness: There is no abdominal tenderness.  Musculoskeletal:        General: Normal range of motion.     Cervical back: Normal range of motion and neck supple.  Skin:    General: Skin is warm and dry.  Neurological:     General: No focal deficit present.     Mental Status: He is alert and oriented to person, place, and time. Mental status is at baseline.     Cranial Nerves: No cranial nerve deficit.     Sensory: No sensory deficit.     Motor: No weakness.     Coordination: Coordination normal.    ED Results / Procedures / Treatments   Labs (all labs ordered are listed, but only abnormal results are displayed) Labs Reviewed  COMPREHENSIVE METABOLIC PANEL  CBC WITH DIFFERENTIAL/PLATELET    EKG None  Radiology No results found.  Procedures Procedures    Medications Ordered in ED Medications  sodium chloride 0.9 % bolus 1,000 mL (has no administration in time range)    ED Course/ Medical Decision Making/ A&P  This patient presents to the ED for concern of altered mental status, this involves an extensive  number of treatment options, and is a complaint that carries with it a high risk of complications and morbidity.  The differential diagnosis includes acute stroke, amnesia, medication side effect   Co morbidities that complicate the patient evaluation  None   Additional history obtained:  No additional history or external records needed   Lab Tests:  I Ordered, and personally interpreted labs.  The pertinent results include: CBC and basic metabolic panel remarkable only for elevated glucose   Imaging Studies ordered:  I ordered imaging studies including CT scan of the head I independently visualized and interpreted imaging which showed no acute intracranial process I agree with the radiologist interpretation   Cardiac Monitoring: / EKG:  None performed   Consultations Obtained:  No consultations needed or indicated   Problem List / ED Course / Critical interventions / Medication management  Patient presenting with complaints of amnesia as described in the HPI.  He arrives here neurologically intact, alert, oriented, and appropriate.  Patient recently started on Wellbutrin for depression/anxiety and this could possibly be a side effect of this.  Nothing in his work-up appears emergent and I feel as though he can safely be discharged.  I will advise him to speak with his prescribing physician and also place a consult with neurology to discuss these episodes.  I feel as though this can be done as an outpatient. No medications ordered I have reviewed the patients home medicines and have made adjustments as needed   Social Determinants of Health:  None   Test / Admission - Considered:  I see no indication for admission.  Patient is alert, oriented, and appropriate.  He is neurologically intact with stable vital signs and I feel can safely be discharged.   Final Clinical Impression(s) / ED Diagnoses Final diagnoses:  None    Rx / DC Orders ED Discharge Orders      None         [name], MD 09/27/21 [phone]

## 2021-09-27 NOTE — ED Notes (Signed)
Discharge instructions reviewed, questions answered. Pt aware that neurology should contact him to set up an appt. Pt states understanding and no further questions. Pt ambulatory with steady gait upon discharge. No s/s of distress noted.

## 2021-09-30 ENCOUNTER — Ambulatory Visit (INDEPENDENT_AMBULATORY_CARE_PROVIDER_SITE_OTHER): Payer: 59 | Admitting: Emergency Medicine

## 2021-09-30 ENCOUNTER — Encounter: Payer: Self-pay | Admitting: Emergency Medicine

## 2021-09-30 VITALS — BP 120/88 | HR 105 | Temp 98.2°F | Ht 75.0 in | Wt 302.2 lb

## 2021-09-30 DIAGNOSIS — M25472 Effusion, left ankle: Secondary | ICD-10-CM

## 2021-09-30 DIAGNOSIS — E1159 Type 2 diabetes mellitus with other circulatory complications: Secondary | ICD-10-CM | POA: Diagnosis not present

## 2021-09-30 DIAGNOSIS — K529 Noninfective gastroenteritis and colitis, unspecified: Secondary | ICD-10-CM | POA: Diagnosis not present

## 2021-09-30 DIAGNOSIS — E785 Hyperlipidemia, unspecified: Secondary | ICD-10-CM

## 2021-09-30 DIAGNOSIS — R55 Syncope and collapse: Secondary | ICD-10-CM | POA: Insufficient documentation

## 2021-09-30 DIAGNOSIS — I152 Hypertension secondary to endocrine disorders: Secondary | ICD-10-CM

## 2021-09-30 DIAGNOSIS — M25475 Effusion, left foot: Secondary | ICD-10-CM

## 2021-09-30 DIAGNOSIS — E1169 Type 2 diabetes mellitus with other specified complication: Secondary | ICD-10-CM | POA: Diagnosis not present

## 2021-09-30 DIAGNOSIS — M25471 Effusion, right ankle: Secondary | ICD-10-CM | POA: Insufficient documentation

## 2021-09-30 DIAGNOSIS — M25474 Effusion, right foot: Secondary | ICD-10-CM

## 2021-09-30 LAB — GLUCOSE, POCT (MANUAL RESULT ENTRY): POC Glucose: 182 mg/dl — AB (ref 70–99)

## 2021-09-30 NOTE — Assessment & Plan Note (Signed)
Differential diagnosis discussed with patient.  Possible seizure events. Needs neurology evaluation.  Referral placed today. Normal recent CT scan of brain.  Asymptomatic today.  No neurological deficits.

## 2021-09-30 NOTE — Progress Notes (Signed)
[name] 41 y.o.   Chief Complaint  Patient presents with   Leg Swelling    Leg swollen x 2 weeks    Foot Swelling    X 2 weeks     HISTORY OF PRESENT ILLNESS: This is a 41 y.o. male recently seen in the emergency department 09/27/2021 with a transient alteration of sensorium which happened at work.  Patient is a Programmer, applications. Work-up included CT of brain and blood work which was unremarkable except for hyperglycemia.  Patient has history of diabetes and hypertension, on multiple medications. Complaining of bilateral ankle swelling. Denies hypoglycemic episodes at home. Had medication reaction about 2 months ago when he tried taking Trintellix. Trintellix was discontinued and he was put back on Wellbutrin which she has taken before without any problems. No new symptoms since emergency department visit. Also has history of chronic diarrhea with chronic abdominal cramping and gas which needs GI evaluation and possible colonoscopy. Blood pressure readings at home normal. Emergency department visit assessment and plan as follows:  Problem List / ED Course / Critical interventions / Medication management   Patient presenting with complaints of amnesia as described in the HPI.  He arrives here neurologically intact, alert, oriented, and appropriate.  Patient recently started on Wellbutrin for depression/anxiety and this could possibly be a side effect of this.  Nothing in his work-up appears emergent and I feel as though he can safely be discharged.  I will advise him to speak with his prescribing physician and also place a consult with neurology to discuss these episodes.  I feel as though this can be done as an outpatient. No medications ordered I have reviewed the patients home medicines and have made adjustments as needed    Wt Readings from Last 3 Encounters:  09/30/21 (!) 302 lb 4 oz (137.1 kg)  09/08/21 (!) 303 lb 4 oz (137.6 kg)  09/01/21 (!) 300 lb 2 oz (136.1 kg)      HPI   Prior to Admission medications   Medication Sig Start Date End Date Taking? Authorizing Provider  acetaminophen (TYLENOL) 500 MG tablet Take 1,000 mg by mouth every 6 (six) hours as needed for mild pain.    [provider]  amLODipine (NORVASC) 10 MG tablet Take 1 tablet (10 mg total) by mouth daily. 09/08/21 09/03/22  Horald Pollen, MD  blood glucose meter kit and supplies Dispense based on patient and insurance preference. Use up to four times daily as directed. (FOR ICD-10 E10.9, E11.9). 01/16/19   Eugenie Filler, MD  buPROPion (WELLBUTRIN XL) 150 MG 24 hr tablet Take 1 tablet (150 mg total) by mouth daily. 09/08/21 09/03/22  Horald Pollen, MD  Continuous Blood Gluc Sensor (FREESTYLE LIBRE 3 SENSOR) MISC 1 application by Does not apply route 2 (two) times daily. Place 1 sensor on the skin every 14 days. Use to check glucose continuously 04/23/21   Horald Pollen, MD  cyclobenzaprine (FLEXERIL) 10 MG tablet Take 10 mg by mouth 3 (three) times daily as needed for muscle spasms.    [provider]  Dulaglutide (TRULICITY) 3 SM/2.7MB SOPN Inject 3 mg as directed once a week. 04/23/21   Horald Pollen, MD  empagliflozin (JARDIANCE) 10 MG TABS tablet Take 1 tablet (10 mg total) by mouth daily before breakfast. 07/22/21 10/20/21  Horald Pollen, MD  fluticasone [facility]) 50 MCG/ACT nasal spray Place 2 sprays into both nostrils daily. 01/14/21   Apolonio Schneiders, FNP  gabapentin (NEURONTIN)  300 MG capsule Take 1 capsule (300 mg total) by mouth 3 (three) times daily. 06/14/21   Sharion Balloon, FNP  glipiZIDE (GLUCOTROL) 10 MG tablet Take 1 tablet (10 mg total) by mouth 2 (two) times daily before a meal. 09/08/21 09/03/22  Dezman Granda, Ines Bloomer, MD  loratadine (CLARITIN) 10 MG tablet Take 10 mg by mouth as needed for allergies.    [provider]  losartan-hydrochlorothiazide (HYZAAR) 100-12.5 MG tablet Take 1 tablet by mouth daily. 09/08/21  09/03/22  Horald Pollen, MD  ondansetron (ZOFRAN) 4 MG tablet Take 1 tablet (4 mg total) by mouth every 8 (eight) hours as needed for nausea or vomiting. 07/15/21   Couture, Cortni S, PA-C  rosuvastatin (CRESTOR) 20 MG tablet Take 1 tablet (20 mg total) by mouth daily. 09/08/21 09/03/22  Horald Pollen, MD  sildenafil (VIAGRA) 100 MG tablet Take 0.5-1 tablets (50-100 mg total) by mouth daily as needed for erectile dysfunction. 04/23/21   Horald Pollen, MD    Allergies  Allergen Reactions   Amoxicillin Anaphylaxis and Other (See Comments)    Has patient had a PCN reaction causing immediate rash, facial/tongue/throat swelling, SOB or lightheadedness with hypotension: yes Has patient had a PCN reaction causing severe rash involving mucus membranes or skin necrosis: no Has patient had a PCN reaction that required hospitalization yes Has patient had a PCN reaction occurring within the last 10 years: yes If all of the above answers are "NO", then may proceed with Cephalosporin use.   Penicillins Anaphylaxis and Other (See Comments)    Patient was told to list this as an allergy because he is allergic to Amoxicillin Has patient had a PCN reaction causing immediate rash, facial/tongue/throat swelling, SOB or lightheadedness with hypotension: yes for Amoxicillin Has patient had a PCN reaction causing severe rash involving mucus membranes or skin necrosis: no Has patient had a PCN reaction that required hospitalization yes for Amoxicillin Has patient had a PCN reaction occurring within the last 10 years: yes for Amoxicillin If all o   Metformin And Related Other (See Comments)    SEVERE GI UPSET   Other Other (See Comments)    Powder in some gloves - localized itching but not allergic to latex, benadryl usually helps with this reaction   Latex Itching   Zolpidem Other (See Comments)    Passed out. And memory loss    Patient Active Problem List   Diagnosis Date Noted    Medication side effects 09/01/2021   Viral gastroenteritis 07/22/2021   Cervical disc disease 09/18/2020   Neck pain 04/17/2020   Diabetic ulcer of left midfoot associated with type 2 diabetes mellitus ([place]) 02/07/2020   Morbid obesity, unspecified obesity type ([place]) 01/10/2020   Hypertension associated with diabetes ([place]) 04/17/2018   Dyslipidemia 04/17/2018   PTSD (post-traumatic stress disorder) 08/11/2016   Spinal stenosis, lumbar region with neurogenic claudication 05/05/2016   Dysthymia 09/02/2015   Chronic low back pain 09/02/2015   Dyslipidemia associated with type 2 diabetes mellitus ([place]) 03/11/2008   UNSPECIFIED ANEMIA 03/11/2008    Past Medical History:  Diagnosis Date   Allergies    Allergy    Anemia    Anxiety    Depression    Diabetes mellitus    GERD (gastroesophageal reflux disease)    Hyperlipidemia    Hypertension    Lumbar disc herniation    Neuromuscular disorder (HCC)    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    PTSD (post-traumatic stress disorder)  Scoliosis    Sleep apnea     Past Surgical History:  Procedure Laterality Date   LUMBAR LAMINECTOMY/DECOMPRESSION MICRODISCECTOMY Left 05/05/2016   Procedure: CENTRAL DECOMPRESSION L4-L5 AND FORAMINOTOMY FOR L4 ROOT AND L5 ROOT ON THE LEFT;  Surgeon: Latanya Maudlin, MD;  Location: [facility];  Service: Orthopedics;  Laterality: Left;   NO PAST SURGERIES      Social History   Socioeconomic History   Marital status: Divorced    Spouse name: Not on file   Number of children: Not on file   Years of education: Not on file   Highest education level: Not on file  Occupational History   Not on file  Tobacco Use   Smoking status: Former    Types: Cigars   Smokeless tobacco: Current    Types: Snuff  Vaping Use   Vaping Use: Never used  Substance and Sexual Activity   Alcohol use: Yes    Alcohol/week: 5.0 standard drinks    Types: 5 Shots of liquor per week    Comment: few times year   Drug use: No    Sexual activity: Not on file  Other Topics Concern   Not on file  Social History Narrative   Not on file   Social Determinants of Health   Financial Resource Strain: Not on file  Food Insecurity: Not on file  Transportation Needs: Not on file  Physical Activity: Not on file  Stress: Not on file  Social Connections: Not on file  Intimate Partner Violence: Not on file    Family History  Problem Relation Age of Onset   Stroke Maternal Grandmother    Colon cancer Neg Hx    Esophageal cancer Neg Hx    Rectal cancer Neg Hx    Stomach cancer Neg Hx      Review of Systems  Constitutional: Negative.  Negative for chills and fever.  HENT: Negative.  Negative for congestion and sore throat.   Eyes: Negative.   Respiratory: Negative.  Negative for cough and shortness of breath.   Cardiovascular:  Positive for leg swelling. Negative for chest pain and palpitations.  Gastrointestinal:  Positive for abdominal pain and diarrhea. Negative for blood in stool, melena, nausea and vomiting.  Genitourinary: Negative.   Musculoskeletal: Negative.   Skin: Negative.  Negative for rash.  Neurological:  Negative for dizziness, speech change, focal weakness, seizures and headaches.  All other systems reviewed and are negative.  Today's Vitals   09/30/21 0855  BP: 120/88  Pulse: (!) 105  Temp: 98.2 F (36.8 C)  TempSrc: Oral  SpO2: 97%  Weight: (!) 302 lb 4 oz (137.1 kg)  Height: _0  (1.905 m)   Body mass index is 37.78 kg/m.  Physical Exam Vitals reviewed.  Constitutional:      Appearance: Normal appearance.  HENT:     Head: Normocephalic and atraumatic.  Eyes:     Extraocular Movements: Extraocular movements intact.     Conjunctiva/sclera: Conjunctivae normal.     Pupils: Pupils are equal, round, and reactive to light.  Cardiovascular:     Rate and Rhythm: Normal rate and regular rhythm.     Pulses: Normal pulses.     Heart sounds: Normal heart sounds.  Pulmonary:      Effort: Pulmonary effort is normal.     Breath sounds: Normal breath sounds.  Abdominal:     General: There is no distension.     Palpations: Abdomen is soft.     Tenderness: There  is no abdominal tenderness.  Musculoskeletal:        General: Normal range of motion.     Cervical back: Neck supple. No tenderness.     Right lower leg: Edema present.     Left lower leg: Edema present.     Comments: Mild edema of ankles and feet  Lymphadenopathy:     Cervical: No cervical adenopathy.  Skin:    General: Skin is warm and dry.     Capillary Refill: Capillary refill takes less than 2 seconds.  Neurological:     General: No focal deficit present.     Mental Status: He is alert and oriented to person, place, and time.     Cranial Nerves: No cranial nerve deficit.     Sensory: No sensory deficit.     Motor: No weakness.     Coordination: Coordination normal.     Gait: Gait normal.  Psychiatric:        Mood and Affect: Mood normal.        Behavior: Behavior normal.     ASSESSMENT & PLAN: A total of 52 minutes was spent with the patient and counseling/coordination of care regarding preparing for this visit, review of most recent office visit note, review of most recent emergency department visit notes, review of recent diagnostic imaging report, review of most recent blood work results, differential diagnosis of near syncopal episode and need for neurology evaluation, review of all medications, education on nutrition, prognosis, documentation, need for follow-up  Problem List Items Addressed This Visit       Cardiovascular and Mediastinum   Hypertension associated with diabetes ([place])    Well-controlled hypertension. BP Readings from Last 3 Encounters:  09/30/21 120/88  09/27/21 (!) 145/78  09/08/21 116/72  Continue amlodipine 10 mg and Hyzaar 100-12.5 mg daily. Ankle edema secondary to amlodipine use. Dietary approaches to stop hypertension discussed. Better controlled  diabetes. Hyperglycemia much improved. Continue weekly Trulicity 3 mg, Jardiance 10 mg daily and glipizide 10 mg twice a day.  No episodes of hypoglycemia.        Relevant Orders   POCT glucose (manual entry) (Completed)   Near syncope - Primary    Differential diagnosis discussed with patient.  Possible seizure events. Needs neurology evaluation.  Referral placed today. Normal recent CT scan of brain.  Asymptomatic today.  No neurological deficits.       Relevant Orders   Ambulatory referral to Neurology     Digestive   Chronic diarrhea    Of several months duration.  Needs GI evaluation and possible colonoscopy.  Referral placed today.       Relevant Orders   Ambulatory referral to Gastroenterology     Endocrine   Dyslipidemia associated with type 2 diabetes mellitus ([place])    Stable.  Continue rosuvastatin 10 mg daily.         Other   Bilateral swelling of feet and ankles    Most likely secondary to amlodipine use.  Advised to maintain a low-sodium diet and keep legs elevated when not in use.         Patient Instructions  Near-Syncope Near-syncope is when you suddenly feel like you might pass out or faint. This may also be called presyncope. During an episode of near-syncope, you may: Feel dizzy, weak, or light-headed. It may feel like the room is spinning. Feel like you may vomit (nauseous). See spots or see all white or all black. Have cold, clammy skin. Feel warm and  sweaty. Hear ringing in your ears. This condition is caused by a sudden decrease in blood flow to the brain. This can result from many causes, but most of those causes are not dangerous. However, near-syncope may be a sign of a serious medical problem, so it is important to seek medical care. Follow these instructions at home: Medicines Take over-the-counter and prescription medicines only as told by your doctor. If you are taking blood pressure or heart medicine, get up slowly and spend many  minutes getting ready to sit and then stand. This can help with dizziness. Lifestyle Do not drive, use machinery, or play sports until your doctor says it is okay. Do not drink alcohol. Do not smoke or use any products that contain nicotine or tobacco. If you need help quitting, ask your doctor. Avoid hot tubs and saunas. General instructions Be aware of any changes in your symptoms. Talk with your doctor about your symptoms. You may need to have testing to help find the cause. If you start to feel like you might pass out, sit or lie down right away. If sitting, lower your head down between your legs. If lying down, raise (elevate) your feet above the level of your heart. Breathe deeply and steadily. Wait until all of the symptoms are gone. Have someone stay with you until you feel better. Drink enough fluid to keep your pee (urine) pale yellow. Avoid standing for a long time. If you must stand for a long time, do movements such as: Moving your legs. Crossing your legs. Flexing and stretching your leg muscles. Squatting. Keep all follow-up visits. Contact a doctor if: You continue to have episodes of near fainting. Get help right away if: You pass out or faint. You have any of these symptoms: Fast or uneven heartbeats (palpitations). Pain in your chest, belly, or back. Shortness of breath. You have a seizure. You have a very bad headache. You are confused. You have trouble seeing. You are very weak. You have trouble walking. You are bleeding from your mouth or butt. You have black or tarry poop (stool). These symptoms may be an emergency. Get help right away. Call your local emergency services (911 in the [place] Do not wait to see if the symptoms will go away. Do not drive yourself to the hospital. Summary Near-syncope is when you suddenly feel like you might pass out or faint. This condition is caused by a sudden decrease in blood flow to the brain. Near-syncope may be a  sign of a serious medical problem, so it is important to seek medical care. If you start to feel like you might pass out, sit or lie down right away. If sitting, lower your head down between your legs. If lying down, raise (elevate) your feet above the level of your heart. Talk with your doctor about your symptoms. You may need to have testing to help find the cause. This information is not intended to replace advice given to you by your health care provider. Make sure you discuss any questions you have with your health care provider. Document Revised: 09/04/2020 Document Reviewed: 09/04/2020 Elsevier Patient Education  [place], MD [facility]

## 2021-09-30 NOTE — Patient Instructions (Signed)
Near-Syncope Near-syncope is when you suddenly feel like you might pass out or faint. This may also be called presyncope. During an episode of near-syncope, you may: Feel dizzy, weak, or light-headed. It may feel like the room is spinning. Feel like you may vomit (nauseous). See spots or see all white or all black. Have cold, clammy skin. Feel warm and sweaty. Hear ringing in your ears. This condition is caused by a sudden decrease in blood flow to the brain. This can result from many causes, but most of those causes are not dangerous. However, near-syncope may be a sign of a serious medical problem, so it is important to seek medical care. Follow these instructions at home: Medicines Take over-the-counter and prescription medicines only as told by your doctor. If you are taking blood pressure or heart medicine, get up slowly and spend many minutes getting ready to sit and then stand. This can help with dizziness. Lifestyle Do not drive, use machinery, or play sports until your doctor says it is okay. Do not drink alcohol. Do not smoke or use any products that contain nicotine or tobacco. If you need help quitting, ask your doctor. Avoid hot tubs and saunas. General instructions Be aware of any changes in your symptoms. Talk with your doctor about your symptoms. You may need to have testing to help find the cause. If you start to feel like you might pass out, sit or lie down right away. If sitting, lower your head down between your legs. If lying down, raise (elevate) your feet above the level of your heart. Breathe deeply and steadily. Wait until all of the symptoms are gone. Have someone stay with you until you feel better. Drink enough fluid to keep your pee (urine) pale yellow. Avoid standing for a long time. If you must stand for a long time, do movements such as: Moving your legs. Crossing your legs. Flexing and stretching your leg muscles. Squatting. Keep all follow-up  visits. Contact a doctor if: You continue to have episodes of near fainting. Get help right away if: You pass out or faint. You have any of these symptoms: Fast or uneven heartbeats (palpitations). Pain in your chest, belly, or back. Shortness of breath. You have a seizure. You have a very bad headache. You are confused. You have trouble seeing. You are very weak. You have trouble walking. You are bleeding from your mouth or butt. You have black or tarry poop (stool). These symptoms may be an emergency. Get help right away. Call your local emergency services (911 in the [place] Do not wait to see if the symptoms will go away. Do not drive yourself to the hospital. Summary Near-syncope is when you suddenly feel like you might pass out or faint. This condition is caused by a sudden decrease in blood flow to the brain. Near-syncope may be a sign of a serious medical problem, so it is important to seek medical care. If you start to feel like you might pass out, sit or lie down right away. If sitting, lower your head down between your legs. If lying down, raise (elevate) your feet above the level of your heart. Talk with your doctor about your symptoms. You may need to have testing to help find the cause. This information is not intended to replace advice given to you by your health care provider. Make sure you discuss any questions you have with your health care provider. Document Revised: 09/04/2020 Document Reviewed: 09/04/2020 Elsevier Patient Education  2023   [employer].

## 2021-09-30 NOTE — Assessment & Plan Note (Signed)
Well-controlled hypertension. BP Readings from Last 3 Encounters:  09/30/21 120/88  09/27/21 (!) 145/78  09/08/21 116/72  Continue amlodipine 10 mg and Hyzaar 100-12.5 mg daily. Ankle edema secondary to amlodipine use. Dietary approaches to stop hypertension discussed. Better controlled diabetes. Hyperglycemia much improved. Continue weekly Trulicity 3 mg, Jardiance 10 mg daily and glipizide 10 mg twice a day.  No episodes of hypoglycemia.

## 2021-09-30 NOTE — Assessment & Plan Note (Signed)
Most likely secondary to amlodipine use.  Advised to maintain a low-sodium diet and keep legs elevated when not in use.

## 2021-09-30 NOTE — Assessment & Plan Note (Signed)
Of several months duration.  Needs GI evaluation and possible colonoscopy.  Referral placed today.

## 2021-09-30 NOTE — Assessment & Plan Note (Signed)
Stable.  Continue rosuvastatin 10 mg daily. 

## 2021-10-18 ENCOUNTER — Other Ambulatory Visit: Payer: Self-pay | Admitting: Family

## 2021-10-18 DIAGNOSIS — M5090 Cervical disc disorder, unspecified, unspecified cervical region: Secondary | ICD-10-CM

## 2021-10-18 DIAGNOSIS — M5412 Radiculopathy, cervical region: Secondary | ICD-10-CM

## 2021-10-21 ENCOUNTER — Encounter: Payer: Self-pay | Admitting: Neurology

## 2021-10-21 ENCOUNTER — Ambulatory Visit (INDEPENDENT_AMBULATORY_CARE_PROVIDER_SITE_OTHER): Payer: 59 | Admitting: Neurology

## 2021-10-21 VITALS — BP 99/67 | HR 105 | Ht 75.0 in | Wt 304.0 lb

## 2021-10-21 DIAGNOSIS — R569 Unspecified convulsions: Secondary | ICD-10-CM

## 2021-10-21 DIAGNOSIS — R419 Unspecified symptoms and signs involving cognitive functions and awareness: Secondary | ICD-10-CM | POA: Diagnosis not present

## 2021-10-21 NOTE — Patient Instructions (Signed)
Routine EEG,  If normal, then will obtain 48 hrs ambulatory EEG  Continue your medications  Follow up in 3 months

## 2021-10-21 NOTE — Progress Notes (Signed)
[facility]  PATIENT: [name] DOB: [birth year]  REQUESTING CLINICIAN: [name], MD HISTORY FROM: Patient  REASON FOR VISIT: Transient alteration of awareness   HISTORICAL  CHIEF COMPLAINT:  Chief Complaint  Patient presents with   New Patient (Initial Visit)    Rm 15. Alone. NP internal referral for transient alteration of awareness, near syncope.    HISTORY OF PRESENT ILLNESS:  This is a 41 year old gentleman past medical history of hypertension, hyperlipidemia and diabetes mellitus who is presenting with 3 incidents of transient alteration of awareness.  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  The first incident was on March 26, he remembers that he was eating and dropped his food and next thing that he knows is that his coworker was telling him that he was talking differently, talking to people who were not there, and he was not making any sense.  He does not remember the incident.  He reported he was terminated from his job on May 23 because of 3 incidents that happened at work.   His letter of termination shows that on the 26 there was a EMS called that was held for 9-minutes while he was working EMS dispatch On April 19, patient received a call where his entry and change resulted in numerous errors including incorrect gender and ultimately unknown problem resulting in low enforcement being dispatched for medical reason.  The note indicated that the call resulted in 7-minute duration time with the majority of the call spent in silence.  And finally on May 20 you receive a call from a pregnant male with sharp pain, you coded this call as a sick 41 year old male.  The caller called back and reported that he sounded out of it. Due to these multiple errors, he was terminated from his job.  He reports since being home, no one has reported any additional incident.  He lives with his 2 sons.  He denies  any previous history of seizures and denies any seizure risk factors, no family history of seizures.   OTHER MEDICAL CONDITIONS: DMII, HTN, HLD   REVIEW OF SYSTEMS: Full 14 system review of systems performed and negative with exception of: as noted the HPI   ALLERGIES: Allergies  Allergen Reactions   Amoxicillin Anaphylaxis and Other (See Comments)    Has patient had a PCN reaction causing immediate rash, facial/tongue/throat swelling, SOB or lightheadedness with hypotension: yes Has patient had a PCN reaction causing severe rash involving mucus membranes or skin necrosis: no Has patient had a PCN reaction that required hospitalization yes Has patient had a PCN reaction occurring within the last 10 years: yes If all of the above answers are "NO", then may proceed with Cephalosporin use.   Penicillins Anaphylaxis and Other (See Comments)    Patient was told to list this as an allergy because he is allergic to Amoxicillin Has patient had a PCN reaction causing immediate rash, facial/tongue/throat swelling, SOB or lightheadedness with hypotension: yes for Amoxicillin Has patient had a PCN reaction causing severe rash involving mucus membranes or skin necrosis: no Has patient had a PCN reaction that required hospitalization yes for Amoxicillin Has patient had a PCN reaction occurring within the last 10 years: yes for Amoxicillin If all o   Metformin And Related Other (See Comments)    SEVERE GI UPSET   Other Other (See Comments)    Powder in some gloves - localized itching but  not allergic to latex, benadryl usually helps with this reaction   Latex Itching   Zolpidem Other (See Comments)    Passed out. And memory loss    HOME MEDICATIONS: Outpatient Medications Prior to Visit  Medication Sig Dispense Refill   acetaminophen (TYLENOL) 500 MG tablet Take 1,000 mg by mouth every 6 (six) hours as needed for mild pain.     amLODipine (NORVASC) 10 MG tablet Take 1 tablet (10 mg total) by  mouth daily. 90 tablet 3   blood glucose meter kit and supplies Dispense based on patient and insurance preference. Use up to four times daily as directed. (FOR ICD-10 E10.9, E11.9). 1 each 0   buPROPion (WELLBUTRIN XL) 150 MG 24 hr tablet Take 1 tablet (150 mg total) by mouth daily. 90 tablet 3   Continuous Blood Gluc Sensor (FREESTYLE LIBRE 3 SENSOR) MISC 1 application by Does not apply route 2 (two) times daily. Place 1 sensor on the skin every 14 days. Use to check glucose continuously 2 each 5   cyclobenzaprine (FLEXERIL) 10 MG tablet Take 10 mg by mouth 3 (three) times daily as needed for muscle spasms.     Dulaglutide (TRULICITY) 3 HA/1.9FX SOPN Inject 3 mg as directed once a week. 6 mL 5   fluticasone (FLONASE) 50 MCG/ACT nasal spray Place 2 sprays into both nostrils daily. 16 g 0   gabapentin (NEURONTIN) 300 MG capsule Take 1 capsule (300 mg total) by mouth 3 (three) times daily. 90 capsule 0   glipiZIDE (GLUCOTROL) 10 MG tablet Take 1 tablet (10 mg total) by mouth 2 (two) times daily before a meal. 180 tablet 3   loratadine (CLARITIN) 10 MG tablet Take 10 mg by mouth as needed for allergies.     losartan-hydrochlorothiazide (HYZAAR) 100-12.5 MG tablet Take 1 tablet by mouth daily. 90 tablet 3   rosuvastatin (CRESTOR) 20 MG tablet Take 1 tablet (20 mg total) by mouth daily. 90 tablet 3   sildenafil (VIAGRA) 100 MG tablet Take 0.5-1 tablets (50-100 mg total) by mouth daily as needed for erectile dysfunction. 5 tablet 11   ondansetron (ZOFRAN) 4 MG tablet Take 1 tablet (4 mg total) by mouth every 8 (eight) hours as needed for nausea or vomiting. 20 tablet 0   No facility-administered medications prior to visit.    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    PTSD (post-traumatic stress disorder)    Scoliosis    Sleep apnea     PAST SURGICAL HISTORY: Past Surgical History:  Procedure Laterality Date   LUMBAR LAMINECTOMY/DECOMPRESSION MICRODISCECTOMY Left 05/05/2016   Procedure: CENTRAL DECOMPRESSION L4-L5 AND FORAMINOTOMY FOR L4 ROOT AND L5 ROOT ON THE LEFT;  Surgeon: Latanya Maudlin, MD;  Location: [facility];  Service: Orthopedics;  Laterality: Left;   NO PAST SURGERIES      FAMILY HISTORY: Family History  Problem Relation Age of Onset   Stroke Maternal Grandmother    Colon cancer Neg Hx    Esophageal cancer Neg Hx    Rectal cancer Neg Hx    Stomach cancer Neg Hx     SOCIAL HISTORY: Social History   Socioeconomic  History   Marital status: Divorced    Spouse name: Not on file   Number of children: Not on file   Years of education: Not on file   Highest education level: Not on file  Occupational History   Not on file  Tobacco Use   Smoking status: Former    Types: Cigars   Smokeless tobacco: Current    Types: Snuff  Vaping Use   Vaping Use: Never used  Substance and Sexual Activity   Alcohol use: Yes    Alcohol/week: 5.0 standard drinks of alcohol    Types: 5 Shots of liquor per week    Comment: few times year   Drug use: No   Sexual activity: Not on file  Other Topics Concern   Not on file  Social History Narrative   Not on file   Social Determinants of Health   Financial Resource Strain: Not on file  Food Insecurity: Not on file  Transportation Needs: Not on file  Physical Activity: Not on file  Stress: Not on file  Social Connections: Not on file  Intimate Partner Violence: Not on file    PHYSICAL EXAM  GENERAL EXAM/CONSTITUTIONAL: Vitals:  Vitals:   10/21/21 0947  BP: 99/67  Pulse: (!) 105  Weight: (!) 304 lb (137.9 kg)  Height: 6' 3"  (1.905 m)   Body mass index is 38 kg/m. Wt Readings from Last 3 Encounters:  10/21/21 (!) 304 lb (137.9 kg)  09/30/21  (!) 302 lb 4 oz (137.1 kg)  09/08/21 (!) 303 lb 4 oz (137.6 kg)   Patient is in no distress; well developed, nourished and groomed; neck is supple  EYES: Pupils round and reactive to light, Visual fields full to confrontation, Extraocular movements intacts,   MUSCULOSKELETAL: Gait, strength, tone, movements noted in Neurologic exam below  NEUROLOGIC: MENTAL STATUS:      No data to display         awake, alert, oriented to person, place and time recent and remote memory intact normal attention and concentration language fluent, comprehension intact, naming intact fund of knowledge appropriate  CRANIAL NERVE:  2nd, 3rd, 4th, 6th - pupils equal and reactive to light, visual fields full to confrontation, extraocular muscles intact, no nystagmus 5th - facial sensation symmetric 7th - facial strength symmetric 8th - hearing intact 9th - palate elevates symmetrically, uvula midline 11th - shoulder shrug symmetric 12th - tongue protrusion midline  MOTOR:  normal bulk and tone, full strength in the BUE, BLE  SENSORY:  normal and symmetric to light touch, pinprick, temperature, vibration  COORDINATION:  finger-nose-finger, fine finger movements normal  REFLEXES:  deep tendon reflexes present and symmetric  GAIT/STATION:  normal   DIAGNOSTIC DATA (LABS, IMAGING, TESTING) - I reviewed patient records, labs, notes, testing and imaging myself where available.  Lab Results  Component Value Date   WBC 11.2 (H) 09/27/2021   HGB 12.2 (L) 09/27/2021   HCT 37.1 (L) 09/27/2021   MCV 83.4 09/27/2021   PLT 249 09/27/2021      Component Value Date/Time   NA 139 09/27/2021 0041   NA 137 06/10/2020 0957   K 3.8 09/27/2021 0041   CL 105 09/27/2021 0041   CO2 27 09/27/2021 0041   GLUCOSE 329 (H) 09/27/2021 0041   BUN 17 09/27/2021 0041   BUN 11 06/10/2020 0957   CREATININE 1.15 09/27/2021 0041   CREATININE 0.76 12/26/2014 1055   CALCIUM 8.7 (L) 09/27/2021 0041   PROT 7.3  09/27/2021 0041  PROT 7.4 06/10/2020 0957   ALBUMIN 3.7 09/27/2021 0041   ALBUMIN 4.4 06/10/2020 0957   AST 20 09/27/2021 0041   ALT 28 09/27/2021 0041   ALKPHOS 56 09/27/2021 0041   BILITOT 0.5 09/27/2021 0041   BILITOT 0.4 06/10/2020 0957   GFRNONAA >60 09/27/2021 0041   GFRNONAA >89 12/26/2014 1055   GFRAA 121 06/10/2020 0957   GFRAA >89 12/26/2014 1055   Lab Results  Component Value Date   CHOL 171 04/23/2021   HDL 40.20 04/23/2021   LDLCALC 100 (H) 04/23/2021   LDLDIRECT 76.9 01/16/2019   TRIG 156.0 (H) 04/23/2021   CHOLHDL 4 04/23/2021   Lab Results  Component Value Date   HGBA1C 9.0 (A) 07/22/2021   Lab Results  Component Value Date   VITAMINB12 354 03/26/2008   Lab Results  Component Value Date   TSH 2.359 07/11/2014    CT Head 09/27/21:  Unremarkable noncontrast CT of the brain   ASSESSMENT AND PLAN  41 y.o. year old male with history of hypertension, hyperlipidemia, diabetes mellitus who is presenting with 3 episodes of altered awareness.  Patient does not have any recollection of these events but these have resulted in him losing his job.  When describing the events, he is noted to make lot of mistakes in his job, talking to people who were not there, and holding the phone in silence.  I told patient that I am concerned that these events are focal seizures and I will start with a routine EEG.  Indicates that the routine EEG is normal then we will obtain a 48-hour ambulatory EEG.  He is comfortable with plan.  Continue current medications and I will see in 3 months for follow-up.   1. Seizure-like activity ([place])   2. Alteration of awareness      Patient Instructions  Routine EEG,  If normal, then will obtain 48 hrs ambulatory EEG  Continue your medications  Follow up in 3 months   Orders Placed This Encounter  Procedures   EEG adult    No orders of the defined types were placed in this encounter.   Return in about 3 months (around  01/21/2022).  I have spent a total of 50 minutes dedicated to this patient today, preparing to see patient, performing a medically appropriate examination and evaluation, ordering tests and/or medications and procedures, and counseling and educating the patient/family/caregiver; independently interpreting result and communicating results to the family/patient/caregiver; and documenting clinical information in the electronic medical record.   Alric Ran, MD 10/21/2021, 3:57 PM  [facility] [address] [place], [place] [address] [phone]

## 2021-10-22 ENCOUNTER — Telehealth: Payer: Self-pay | Admitting: Neurology

## 2021-10-22 ENCOUNTER — Other Ambulatory Visit: Payer: 59 | Admitting: *Deleted

## 2021-10-22 NOTE — Telephone Encounter (Signed)
LVM and mychart msg to r/s appt - kelly out

## 2021-10-22 NOTE — Telephone Encounter (Signed)
VM left x2 asking pt to call us and r/s.

## 2021-10-27 ENCOUNTER — Encounter: Payer: Self-pay | Admitting: Emergency Medicine

## 2021-10-27 ENCOUNTER — Encounter: Payer: Self-pay | Admitting: Neurology

## 2021-10-27 ENCOUNTER — Ambulatory Visit: Payer: 59 | Admitting: *Deleted

## 2021-10-27 ENCOUNTER — Ambulatory Visit (INDEPENDENT_AMBULATORY_CARE_PROVIDER_SITE_OTHER): Payer: 59 | Admitting: Emergency Medicine

## 2021-10-27 ENCOUNTER — Encounter: Payer: Self-pay | Admitting: *Deleted

## 2021-10-27 VITALS — BP 130/86 | HR 112 | Temp 98.6°F | Ht 75.0 in | Wt 302.2 lb

## 2021-10-27 DIAGNOSIS — E785 Hyperlipidemia, unspecified: Secondary | ICD-10-CM

## 2021-10-27 DIAGNOSIS — E1169 Type 2 diabetes mellitus with other specified complication: Secondary | ICD-10-CM

## 2021-10-27 DIAGNOSIS — E1159 Type 2 diabetes mellitus with other circulatory complications: Secondary | ICD-10-CM | POA: Diagnosis not present

## 2021-10-27 DIAGNOSIS — E1165 Type 2 diabetes mellitus with hyperglycemia: Secondary | ICD-10-CM

## 2021-10-27 DIAGNOSIS — I152 Hypertension secondary to endocrine disorders: Secondary | ICD-10-CM

## 2021-10-27 DIAGNOSIS — S90229A Contusion of unspecified lesser toe(s) with damage to nail, initial encounter: Secondary | ICD-10-CM | POA: Insufficient documentation

## 2021-10-27 LAB — POCT GLYCOSYLATED HEMOGLOBIN (HGB A1C): Hemoglobin A1C: 9.8 % — AB (ref 4.0–5.6)

## 2021-10-27 MED ORDER — EMPAGLIFLOZIN 25 MG PO TABS: 25.0000 mg | ORAL_TABLET | Freq: Every day | ORAL | 1 refills | Status: DC

## 2021-10-27 NOTE — Assessment & Plan Note (Signed)
Well-controlled hypertension.  Continue Hyzaar 100-12.5 mg daily. Uncontrolled diabetes with hemoglobin A1c higher than before at 9.8. Continue Trulicity 3 mg weekly and glipizide 10 mg twice a day. Will increase Jardiance to 25 mg daily. Diet and nutrition discussed. Cardiovascular risks associated with hypertension and diabetes discussed. Follow-up in 3 months.

## 2021-10-27 NOTE — Assessment & Plan Note (Signed)
Stable.  Diet and nutrition discussed.  Continue rosuvastatin 20 mg daily.  

## 2021-10-27 NOTE — Assessment & Plan Note (Signed)
Benign.  Healing well.  No sign of toe fracture.

## 2021-10-27 NOTE — Patient Instructions (Signed)
Continue weekly Trulicity 3 mg Continue glipizide 10 mg twice a day Increase dose of Jardiance to 25 mg daily. Follow-up in 3 months  Diabetes Mellitus and Nutrition, Adult When you have diabetes, or diabetes mellitus, it is very important to have healthy eating habits because your blood sugar (glucose) levels are greatly affected by what you eat and drink. Eating healthy foods in the right amounts, at about the same times every day, can help you: Manage your blood glucose. Lower your risk of heart disease. Improve your blood pressure. Reach or maintain a healthy weight. What can affect my meal plan? Every person with diabetes is different, and each person has different needs for a meal plan. Your health care provider may recommend that you work with a dietitian to make a meal plan that is best for you. Your meal plan may vary depending on factors such as: The calories you need. The medicines you take. Your weight. Your blood glucose, blood pressure, and cholesterol levels. Your activity level. Other health conditions you have, such as heart or kidney disease. How do carbohydrates affect me? Carbohydrates, also called carbs, affect your blood glucose level more than any other type of food. Eating carbs raises the amount of glucose in your blood. It is important to know how many carbs you can safely have in each meal. This is different for every person. Your dietitian can help you calculate how many carbs you should have at each meal and for each snack. How does alcohol affect me? Alcohol can cause a decrease in blood glucose (hypoglycemia), especially if you use insulin or take certain diabetes medicines by mouth. Hypoglycemia can be a life-threatening condition. Symptoms of hypoglycemia, such as sleepiness, dizziness, and confusion, are similar to symptoms of having too much alcohol. Do not drink alcohol if: Your health care provider tells you not to drink. You are pregnant, may be  pregnant, or are planning to become pregnant. If you drink alcohol: Limit how much you have to: 0-1 drink a day for women. 0-2 drinks a day for men. Know how much alcohol is in your drink. In the [place] one drink equals one 12 oz bottle of beer (355 mL), one 5 oz glass of wine (148 mL), or one 1 oz glass of hard liquor (44 mL). Keep yourself hydrated with water, diet soda, or unsweetened iced tea. Keep in mind that regular soda, juice, and other mixers may contain a lot of sugar and must be counted as carbs. What are tips for following this plan?  Reading food labels Start by checking the serving size on the Nutrition Facts label of packaged foods and drinks. The number of calories and the amount of carbs, fats, and other nutrients listed on the label are based on one serving of the item. Many items contain more than one serving per package. Check the total grams (g) of carbs in one serving. Check the number of grams of saturated fats and trans fats in one serving. Choose foods that have a low amount or none of these fats. Check the number of milligrams (mg) of salt (sodium) in one serving. Most people should limit total sodium intake to less than 2,300 mg per day. Always check the nutrition information of foods labeled as "low-fat" or "nonfat." These foods may be higher in added sugar or refined carbs and should be avoided. Talk to your dietitian to identify your daily goals for nutrients listed on the label. Shopping Avoid buying canned, pre-made, or processed foods.  These foods tend to be high in fat, sodium, and added sugar. Shop around the outside edge of the grocery store. This is where you will most often find fresh fruits and vegetables, bulk grains, fresh meats, and fresh dairy products. Cooking Use low-heat cooking methods, such as baking, instead of high-heat cooking methods, such as deep frying. Cook using healthy oils, such as olive, canola, or sunflower oil. Avoid cooking with  butter, cream, or high-fat meats. Meal planning Eat meals and snacks regularly, preferably at the same times every day. Avoid going long periods of time without eating. Eat foods that are high in fiber, such as fresh fruits, vegetables, beans, and whole grains. Eat 4-6 oz (112-168 g) of lean protein each day, such as lean meat, chicken, fish, eggs, or tofu. One ounce (oz) (28 g) of lean protein is equal to: 1 oz (28 g) of meat, chicken, or fish. 1 egg.  cup (62 g) of tofu. Eat some foods each day that contain healthy fats, such as avocado, nuts, seeds, and fish. What foods should I eat? Fruits Berries. Apples. Oranges. Peaches. Apricots. Plums. Grapes. Mangoes. Papayas. Pomegranates. Kiwi. Cherries. Vegetables Leafy greens, including lettuce, spinach, kale, chard, collard greens, mustard greens, and cabbage. Beets. Cauliflower. Broccoli. Carrots. Green beans. Tomatoes. Peppers. Onions. Cucumbers. Brussels sprouts. Grains Whole grains, such as whole-wheat or whole-grain bread, crackers, tortillas, cereal, and pasta. Unsweetened oatmeal. Quinoa. Brown or wild rice. Meats and other proteins Seafood. Poultry without skin. Lean cuts of poultry and beef. Tofu. Nuts. Seeds. Dairy Low-fat or fat-free dairy products such as milk, yogurt, and cheese. The items listed above may not be a complete list of foods and beverages you can eat and drink. Contact a dietitian for more information. What foods should I avoid? Fruits Fruits canned with syrup. Vegetables Canned vegetables. Frozen vegetables with butter or cream sauce. Grains Refined white flour and flour products such as bread, pasta, snack foods, and cereals. Avoid all processed foods. Meats and other proteins Fatty cuts of meat. Poultry with skin. Breaded or fried meats. Processed meat. Avoid saturated fats. Dairy Full-fat yogurt, cheese, or milk. Beverages Sweetened drinks, such as soda or iced tea. The items listed above may not be a  complete list of foods and beverages you should avoid. Contact a dietitian for more information. Questions to ask a health care provider Do I need to meet with a certified diabetes care and education specialist? Do I need to meet with a dietitian? What number can I call if I have questions? When are the best times to check my blood glucose? Where to find more information: American Diabetes Association: diabetes.org Academy of Nutrition and Dietetics: eatright.[employer] of Diabetes and Digestive and Kidney Diseases: [employer] Association of Diabetes Care & Education Specialists: diabeteseducator.org Summary It is important to have healthy eating habits because your blood sugar (glucose) levels are greatly affected by what you eat and drink. It is important to use alcohol carefully. A healthy meal plan will help you manage your blood glucose and lower your risk of heart disease. Your health care provider may recommend that you work with a dietitian to make a meal plan that is best for you. This information is not intended to replace advice given to you by your health care provider. Make sure you discuss any questions you have with your health care provider. Document Revised: 11/28/2019 Document Reviewed: 11/28/2019 Elsevier Patient Education  [place].

## 2021-10-27 NOTE — Progress Notes (Signed)
[name] 41 y.o.   Chief Complaint  Patient presents with   Follow-up    F/u after neurology appt   toe issues    On left foot    HISTORY OF PRESENT ILLNESS: This is a 41 y.o. male with history of diabetes hypertension and dyslipidemia here for follow-up. Also complaining of "issues with toes on left foot" for several days.  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 Seen by me last month also for periods of altered consciousness refer to neurology for evaluation of possible seizures. Lab Results  Component Value Date   HGBA1C 9.0 (A) 07/22/2021   Neurology evaluation earlier this month assessment and plan as follows: CT Head 09/27/21:  Unremarkable noncontrast CT of the brain     ASSESSMENT AND PLAN   41 y.o. year old male with history of hypertension, hyperlipidemia, diabetes mellitus who is presenting with 3 episodes of altered awareness.  Patient does not have any recollection of these events but these have resulted in him losing his job.  When describing the events, he is noted to make lot of mistakes in his job, talking to people who were not there, and holding the phone in silence.  I told patient that I am concerned that these events are focal seizures and I will start with a routine EEG.  Indicates that the routine EEG is normal then we will obtain a 48-hour ambulatory EEG.  He is comfortable with plan.  Continue current medications and I will see in 3 months for follow-up.     1. Seizure-like activity ([place])   2. Alteration of awareness         Patient Instructions  Routine EEG,  If normal, then will obtain 48 hrs ambulatory EEG  Continue your medications  Follow up in 3 months      HPI   Prior to Admission medications   Medication Sig Start Date End Date Taking? Authorizing Provider  acetaminophen (TYLENOL) 500 MG tablet Take 1,000 mg by mouth every 6 (six) hours as needed for mild  pain.   Yes [provider]  amLODipine (NORVASC) 10 MG tablet Take 1 tablet (10 mg total) by mouth daily. 09/08/21 09/03/22 Yes Lynnleigh Soden, Ines Bloomer, MD  blood glucose meter kit and supplies Dispense based on patient and insurance preference. Use up to four times daily as directed. (FOR ICD-10 E10.9, E11.9). 01/16/19  Yes Eugenie Filler, MD  buPROPion (WELLBUTRIN XL) 150 MG 24 hr tablet Take 1 tablet (150 mg total) by mouth daily. 09/08/21 09/03/22 Yes Amalio Loe, Ines Bloomer, MD  Continuous Blood Gluc Sensor (FREESTYLE LIBRE 3 SENSOR) MISC 1 application by Does not apply route 2 (two) times daily. Place 1 sensor on the skin every 14 days. Use to check glucose continuously 04/23/21  Yes Charels Stambaugh, Ines Bloomer, MD  cyclobenzaprine (FLEXERIL) 10 MG tablet Take 10 mg by mouth 3 (three) times daily as needed for muscle spasms.   Yes [provider]  Dulaglutide (TRULICITY) 3 DX/4.1OI SOPN Inject 3 mg as directed once a week. 04/23/21  Yes Mahkai Fangman, Ines Bloomer, MD  fluticasone [facility]) 50 MCG/ACT nasal spray Place 2 sprays into both nostrils daily. 01/14/21  Yes Apolonio Schneiders, FNP  gabapentin (NEURONTIN) 300 MG capsule Take 1 capsule (300 mg total) by mouth 3 (three) times daily. 06/14/21  Yes Hawks, Christy A, FNP  glipiZIDE (GLUCOTROL) 10 MG tablet Take 1 tablet (10 mg total) by  mouth 2 (two) times daily before a meal. 09/08/21 09/03/22 Yes Abbagale Goguen, Ines Bloomer, MD  loratadine (CLARITIN) 10 MG tablet Take 10 mg by mouth as needed for allergies.   Yes [provider]  losartan-hydrochlorothiazide (HYZAAR) 100-12.5 MG tablet Take 1 tablet by mouth daily. 09/08/21 09/03/22 Yes Makyra Corprew, Ines Bloomer, MD  rosuvastatin (CRESTOR) 20 MG tablet Take 1 tablet (20 mg total) by mouth daily. 09/08/21 09/03/22 Yes Marque Bango, Ines Bloomer, MD  sildenafil (VIAGRA) 100 MG tablet Take 0.5-1 tablets (50-100 mg total) by mouth daily as needed for erectile dysfunction. 04/23/21  Yes Tesha Archambeau, Ines Bloomer, MD     Allergies  Allergen Reactions   Amoxicillin Anaphylaxis and Other (See Comments)    Has patient had a PCN reaction causing immediate rash, facial/tongue/throat swelling, SOB or lightheadedness with hypotension: yes Has patient had a PCN reaction causing severe rash involving mucus membranes or skin necrosis: no Has patient had a PCN reaction that required hospitalization yes Has patient had a PCN reaction occurring within the last 10 years: yes If all of the above answers are "NO", then may proceed with Cephalosporin use.   Penicillins Anaphylaxis and Other (See Comments)    Patient was told to list this as an allergy because he is allergic to Amoxicillin Has patient had a PCN reaction causing immediate rash, facial/tongue/throat swelling, SOB or lightheadedness with hypotension: yes for Amoxicillin Has patient had a PCN reaction causing severe rash involving mucus membranes or skin necrosis: no Has patient had a PCN reaction that required hospitalization yes for Amoxicillin Has patient had a PCN reaction occurring within the last 10 years: yes for Amoxicillin If all o   Metformin And Related Other (See Comments)    SEVERE GI UPSET   Other Other (See Comments)    Powder in some gloves - localized itching but not allergic to latex, benadryl usually helps with this reaction   Latex Itching   Zolpidem Other (See Comments)    Passed out. And memory loss    Patient Active Problem List   Diagnosis Date Noted   Bilateral swelling of feet and ankles 09/30/2021   Chronic diarrhea 09/30/2021   Near syncope 09/30/2021   Medication side effects 09/01/2021   Viral gastroenteritis 07/22/2021   Cervical disc disease 09/18/2020   Neck pain 04/17/2020   Diabetic ulcer of left midfoot associated with type 2 diabetes mellitus ([place]) 02/07/2020   Morbid obesity, unspecified obesity type ([place]) 01/10/2020   Hypertension associated with diabetes ([place]) 04/17/2018   Dyslipidemia 04/17/2018   PTSD  (post-traumatic stress disorder) 08/11/2016   Spinal stenosis, lumbar region with neurogenic claudication 05/05/2016   Dysthymia 09/02/2015   Chronic low back pain 09/02/2015   Dyslipidemia associated with type 2 diabetes mellitus ([place]) 03/11/2008   UNSPECIFIED ANEMIA 03/11/2008    Past Medical History:  Diagnosis Date   Allergies    Allergy    Anemia    Anxiety    Depression    Diabetes mellitus    GERD (gastroesophageal reflux disease)    Hyperlipidemia    Hypertension    Lumbar disc herniation    Neuromuscular disorder (HCC)    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    PTSD (post-traumatic stress disorder)    Scoliosis    Sleep apnea     Past Surgical History:  Procedure Laterality Date   LUMBAR LAMINECTOMY/DECOMPRESSION MICRODISCECTOMY Left 05/05/2016   Procedure: CENTRAL DECOMPRESSION L4-L5 AND FORAMINOTOMY FOR L4 ROOT AND L5 ROOT ON THE LEFT;  Surgeon: Latanya Maudlin, MD;  Location: [facility];  Service: Orthopedics;  Laterality: Left;   NO PAST SURGERIES      Social History   Socioeconomic History   Marital status: Divorced    Spouse name: Not on file   Number of children: Not on file   Years of education: Not on file   Highest education level: Not on file  Occupational History   Not on file  Tobacco Use   Smoking status: Former    Types: Cigars   Smokeless tobacco: Current    Types: Snuff  Vaping Use   Vaping Use: Never used  Substance and Sexual Activity   Alcohol use: Yes    Alcohol/week: 5.0 standard drinks of alcohol    Types: 5 Shots of liquor per week    Comment: few times year   Drug use: No   Sexual activity: Not on file  Other Topics Concern   Not on file  Social History Narrative   Not on file   Social Determinants of Health   Financial Resource Strain: Not on file  Food Insecurity: Not on file  Transportation Needs: Not on file  Physical Activity: Not on file  Stress: Not on file  Social Connections: Not on file  Intimate Partner Violence:  Not on file    Family History  Problem Relation Age of Onset   Stroke Maternal Grandmother    Colon cancer Neg Hx    Esophageal cancer Neg Hx    Rectal cancer Neg Hx    Stomach cancer Neg Hx      Review of Systems  Constitutional: Negative.  Negative for chills and fever.  HENT: Negative.  Negative for congestion and sore throat.   Respiratory: Negative.  Negative for cough and shortness of breath.   Cardiovascular: Negative.  Negative for chest pain and palpitations.  Gastrointestinal:  Negative for abdominal pain, diarrhea, nausea and vomiting.  Genitourinary: Negative.   Skin: Negative.  Negative for rash.  Neurological:  Negative for dizziness and headaches.  All other systems reviewed and are negative.  Today's Vitals   10/27/21 1444  BP: 130/86  Pulse: (!) 112  Temp: 98.6 F (37 C)  TempSrc: Oral  SpO2: 96%  Weight: (!) 302 lb 4 oz (137.1 kg)  Height: _0  (1.905 m)   Body mass index is 37.78 kg/m. Wt Readings from Last 3 Encounters:  10/27/21 (!) 302 lb 4 oz (137.1 kg)  10/21/21 (!) 304 lb (137.9 kg)  09/30/21 (!) 302 lb 4 oz (137.1 kg)     Physical Exam Vitals reviewed.  Constitutional:      Appearance: Normal appearance. He is obese.  HENT:     Head: Normocephalic.  Eyes:     Extraocular Movements: Extraocular movements intact.     Pupils: Pupils are equal, round, and reactive to light.  Cardiovascular:     Rate and Rhythm: Normal rate and regular rhythm.     Pulses: Normal pulses.     Heart sounds: Normal heart sounds.  Pulmonary:     Effort: Pulmonary effort is normal.     Breath sounds: Normal breath sounds.  Musculoskeletal:     Cervical back: No tenderness.     Right lower leg: No edema.     Left lower leg: No edema.     Comments: Left foot second and third toes with small subungual hematomas  Lymphadenopathy:     Cervical: No cervical adenopathy.  Skin:    General: Skin is warm and dry.  Capillary Refill: Capillary refill takes  less than 2 seconds.  Neurological:     General: No focal deficit present.     Mental Status: He is alert and oriented to person, place, and time.  Psychiatric:        Mood and Affect: Mood normal.        Behavior: Behavior normal.     Results for orders placed or performed in visit on 10/27/21 (from the past 24 hour(s))  POCT HgB A1C     Status: Abnormal   Collection Time: 10/27/21  3:00 PM  Result Value Ref Range   Hemoglobin A1C 9.8 (A) 4.0 - 5.6 %   HbA1c POC (<> result, manual entry)     HbA1c, POC (prediabetic range)     HbA1c, POC (controlled diabetic range)      ASSESSMENT & PLAN: A total of 50 minutes was spent with the patient and counseling/coordination of care regarding preparing for this visit, review of most recent office visit notes, review of most recent blood work results including today's hemoglobin A1c, review of all medications and changes made, cardiovascular risk associated with uncontrolled diabetes, review of multiple chronic medical problems and their management, education on nutrition, prognosis, documentation, and need for follow-up.  Problem List Items Addressed This Visit       Cardiovascular and Mediastinum   Hypertension associated with diabetes ([place]) - Primary    Well-controlled hypertension.  Continue Hyzaar 100-12.5 mg daily. Uncontrolled diabetes with hemoglobin A1c higher than before at 9.8. Continue Trulicity 3 mg weekly and glipizide 10 mg twice a day. Will increase Jardiance to 25 mg daily. Diet and nutrition discussed. Cardiovascular risks associated with hypertension and diabetes discussed. Follow-up in 3 months.      Relevant Medications   empagliflozin (JARDIANCE) 25 MG TABS tablet     Endocrine   Dyslipidemia associated with type 2 diabetes mellitus (HCC)    Stable.  Diet and nutrition discussed.  Continue rosuvastatin 20 mg daily.      Relevant Medications   empagliflozin (JARDIANCE) 25 MG TABS tablet     Musculoskeletal and  Integument   Subungual hematoma of lesser toe    Benign.  Healing well.  No sign of toe fracture.      Other Visit Diagnoses     Type 2 diabetes mellitus with hyperglycemia, without long-term current use of insulin (HCC)       Relevant Medications   empagliflozin (JARDIANCE) 25 MG TABS tablet   Other Relevant Orders   POCT HgB A1C (Completed)        Patient Instructions  Continue weekly Trulicity 3 mg Continue glipizide 10 mg twice a day Increase dose of Jardiance to 25 mg daily. Follow-up in 3 months  Diabetes Mellitus and Nutrition, Adult When you have diabetes, or diabetes mellitus, it is very important to have healthy eating habits because your blood sugar (glucose) levels are greatly affected by what you eat and drink. Eating healthy foods in the right amounts, at about the same times every day, can help you: Manage your blood glucose. Lower your risk of heart disease. Improve your blood pressure. Reach or maintain a healthy weight. What can affect my meal plan? Every person with diabetes is different, and each person has different needs for a meal plan. Your health care provider may recommend that you work with a dietitian to make a meal plan that is best for you. Your meal plan may vary depending on factors such as: The calories you  need. The medicines you take. Your weight. Your blood glucose, blood pressure, and cholesterol levels. Your activity level. Other health conditions you have, such as heart or kidney disease. How do carbohydrates affect me? Carbohydrates, also called carbs, affect your blood glucose level more than any other type of food. Eating carbs raises the amount of glucose in your blood. It is important to know how many carbs you can safely have in each meal. This is different for every person. Your dietitian can help you calculate how many carbs you should have at each meal and for each snack. How does alcohol affect me? Alcohol can cause a decrease  in blood glucose (hypoglycemia), especially if you use insulin or take certain diabetes medicines by mouth. Hypoglycemia can be a life-threatening condition. Symptoms of hypoglycemia, such as sleepiness, dizziness, and confusion, are similar to symptoms of having too much alcohol. Do not drink alcohol if: Your health care provider tells you not to drink. You are pregnant, may be pregnant, or are planning to become pregnant. If you drink alcohol: Limit how much you have to: 0-1 drink a day for women. 0-2 drinks a day for men. Know how much alcohol is in your drink. In the [place] one drink equals one 12 oz bottle of beer (355 mL), one 5 oz glass of wine (148 mL), or one 1 oz glass of hard liquor (44 mL). Keep yourself hydrated with water, diet soda, or unsweetened iced tea. Keep in mind that regular soda, juice, and other mixers may contain a lot of sugar and must be counted as carbs. What are tips for following this plan?  Reading food labels Start by checking the serving size on the Nutrition Facts label of packaged foods and drinks. The number of calories and the amount of carbs, fats, and other nutrients listed on the label are based on one serving of the item. Many items contain more than one serving per package. Check the total grams (g) of carbs in one serving. Check the number of grams of saturated fats and trans fats in one serving. Choose foods that have a low amount or none of these fats. Check the number of milligrams (mg) of salt (sodium) in one serving. Most people should limit total sodium intake to less than 2,300 mg per day. Always check the nutrition information of foods labeled as "low-fat" or "nonfat." These foods may be higher in added sugar or refined carbs and should be avoided. Talk to your dietitian to identify your daily goals for nutrients listed on the label. Shopping Avoid buying canned, pre-made, or processed foods. These foods tend to be high in fat, sodium, and added  sugar. Shop around the outside edge of the grocery store. This is where you will most often find fresh fruits and vegetables, bulk grains, fresh meats, and fresh dairy products. Cooking Use low-heat cooking methods, such as baking, instead of high-heat cooking methods, such as deep frying. Cook using healthy oils, such as olive, canola, or sunflower oil. Avoid cooking with butter, cream, or high-fat meats. Meal planning Eat meals and snacks regularly, preferably at the same times every day. Avoid going long periods of time without eating. Eat foods that are high in fiber, such as fresh fruits, vegetables, beans, and whole grains. Eat 4-6 oz (112-168 g) of lean protein each day, such as lean meat, chicken, fish, eggs, or tofu. One ounce (oz) (28 g) of lean protein is equal to: 1 oz (28 g) of meat, chicken, or fish.  1 egg.  cup (62 g) of tofu. Eat some foods each day that contain healthy fats, such as avocado, nuts, seeds, and fish. What foods should I eat? Fruits Berries. Apples. Oranges. Peaches. Apricots. Plums. Grapes. Mangoes. Papayas. Pomegranates. Kiwi. Cherries. Vegetables Leafy greens, including lettuce, spinach, kale, chard, collard greens, mustard greens, and cabbage. Beets. Cauliflower. Broccoli. Carrots. Green beans. Tomatoes. Peppers. Onions. Cucumbers. Brussels sprouts. Grains Whole grains, such as whole-wheat or whole-grain bread, crackers, tortillas, cereal, and pasta. Unsweetened oatmeal. Quinoa. Brown or wild rice. Meats and other proteins Seafood. Poultry without skin. Lean cuts of poultry and beef. Tofu. Nuts. Seeds. Dairy Low-fat or fat-free dairy products such as milk, yogurt, and cheese. The items listed above may not be a complete list of foods and beverages you can eat and drink. Contact a dietitian for more information. What foods should I avoid? Fruits Fruits canned with syrup. Vegetables Canned vegetables. Frozen vegetables with butter or cream  sauce. Grains Refined white flour and flour products such as bread, pasta, snack foods, and cereals. Avoid all processed foods. Meats and other proteins Fatty cuts of meat. Poultry with skin. Breaded or fried meats. Processed meat. Avoid saturated fats. Dairy Full-fat yogurt, cheese, or milk. Beverages Sweetened drinks, such as soda or iced tea. The items listed above may not be a complete list of foods and beverages you should avoid. Contact a dietitian for more information. Questions to ask a health care provider Do I need to meet with a certified diabetes care and education specialist? Do I need to meet with a dietitian? What number can I call if I have questions? When are the best times to check my blood glucose? Where to find more information: American Diabetes Association: diabetes.org Academy of Nutrition and Dietetics: eatright.[employer] of Diabetes and Digestive and Kidney Diseases: [employer] Association of Diabetes Care & Education Specialists: diabeteseducator.org Summary It is important to have healthy eating habits because your blood sugar (glucose) levels are greatly affected by what you eat and drink. It is important to use alcohol carefully. A healthy meal plan will help you manage your blood glucose and lower your risk of heart disease. Your health care provider may recommend that you work with a dietitian to make a meal plan that is best for you. This information is not intended to replace advice given to you by your health care provider. Make sure you discuss any questions you have with your health care provider. Document Revised: 11/28/2019 Document Reviewed: 11/28/2019 Elsevier Patient Education  [place], MD [facility]

## 2021-10-28 ENCOUNTER — Ambulatory Visit: Payer: 59 | Admitting: Neurology

## 2021-10-29 DIAGNOSIS — Z0289 Encounter for other administrative examinations: Secondary | ICD-10-CM

## 2021-11-03 ENCOUNTER — Other Ambulatory Visit: Payer: Self-pay | Admitting: *Deleted

## 2021-11-03 ENCOUNTER — Ambulatory Visit: Payer: 59 | Admitting: Emergency Medicine

## 2021-11-03 ENCOUNTER — Telehealth: Payer: Self-pay | Admitting: Neurology

## 2021-11-03 ENCOUNTER — Other Ambulatory Visit: Payer: 59 | Admitting: *Deleted

## 2021-11-03 NOTE — Telephone Encounter (Signed)
LVM and sent mychart msg informing pt of r/s needed for today's EEG- Tresa Endo out sick.

## 2021-11-04 ENCOUNTER — Ambulatory Visit (INDEPENDENT_AMBULATORY_CARE_PROVIDER_SITE_OTHER): Payer: 59 | Admitting: Neurology

## 2021-11-04 DIAGNOSIS — R569 Unspecified convulsions: Secondary | ICD-10-CM

## 2021-11-04 NOTE — Procedures (Signed)
    History:  41 year old man with seizure like activity   EEG classification:  Awake and asleep  Description of the recording: The background rhythms of this recording consists of a fairly well modulated medium amplitude background activity of 10 Hz. As the record progresses, the patient initially is in the waking state, but appears to enter the early stage II sleep during the recording, with rudimentary sleep spindles and vertex sharp wave activity seen. During the wakeful state, photic stimulation is performed, and no abnormal responses were seen. Hyperventilation was also performed, no abnormal response seen. No epileptiform discharges seen during this recording. There was no focal slowing. EKG monitor shows no evidence of cardiac rhythm abnormalities with a heart rate of 96.  Abnormality: None   Impression: This is a normal EEG recording in the waking and sleeping state. No evidence interictal epileptiform discharges were seen at any time during the recording.  A normal EEG does not exclude a diagnosis of epilepsy.    Alric Ran, MD [facility]

## 2021-11-06 ENCOUNTER — Encounter: Payer: Self-pay | Admitting: Gastroenterology

## 2021-11-06 ENCOUNTER — Ambulatory Visit: Payer: 59 | Admitting: Gastroenterology

## 2021-11-06 VITALS — BP 128/82 | HR 110 | Ht 75.0 in | Wt 304.0 lb

## 2021-11-06 DIAGNOSIS — R1310 Dysphagia, unspecified: Secondary | ICD-10-CM | POA: Diagnosis not present

## 2021-11-06 DIAGNOSIS — D649 Anemia, unspecified: Secondary | ICD-10-CM

## 2021-11-06 DIAGNOSIS — K219 Gastro-esophageal reflux disease without esophagitis: Secondary | ICD-10-CM

## 2021-11-06 DIAGNOSIS — R197 Diarrhea, unspecified: Secondary | ICD-10-CM

## 2021-11-06 DIAGNOSIS — K625 Hemorrhage of anus and rectum: Secondary | ICD-10-CM

## 2021-11-06 MED ORDER — NA SULFATE-K SULFATE-MG SULF 17.5-3.13-1.6 GM/177ML PO SOLN: 1.0000 | Freq: Once | ORAL | 0 refills | Status: AC

## 2021-11-06 MED ORDER — OMEPRAZOLE 40 MG PO CPDR: 40.0000 mg | DELAYED_RELEASE_CAPSULE | Freq: Two times a day (BID) | ORAL | 3 refills | Status: DC

## 2021-11-06 NOTE — Patient Instructions (Addendum)
It was my pleasure to provide care to you today. Based on our discussion, I am providing you with my recommendations below:  RECOMMENDATION(S):   I recommended stool studies and endoscopic evaluation of your diarrhea.  I recommended an empiric trial of acid blocking medicines and an EGD for further evaluation of your reflux and swallowing difficulties.  Consider trial of psyllium for stool bulking in the meantime.  We will need to get approval from neurology prior to endoscopic evaluation given your ongoing evaluation for possible seizures.  Please let me know if you have any questions before that time.  PRESCRIPTION MEDICATION(S):   We have sent the following medication(s) to your pharmacy:  Pantoprazole 40 mg twice daily 30-60 minutes before breakfast and dinner.  LABS:   Please proceed to the basement level for lab work before leaving today. Press "B" on the elevator. The lab is located at the first door on the left as you exit the elevator.  COLONOSCOPY/UPPER ENDOSCOPY:   You have been scheduled for a colonoscopy/upper endoscopy. Please follow written instructions given to you at your visit today.   PREP:   Please pick up your prep supplies at the pharmacy within the next 1-3 days.  INHALERS:   If you use inhalers (even only as needed), please bring them with you on the day of your procedure.   COLONOSCOPY TIPS:  To reduce nausea and dehydration, stay well hydrated for 3-4 days prior to the exam.  To prevent skin/hemorrhoid irritation - prior to wiping, put A&Dointment or vaseline on the toilet paper. Keep a towel or pad on the bed.  BEFORE STARTING YOUR PREP, drink  64oz of clear liquids in the morning. This will help to flush the colon and will ensure you are well hydrated!!!!  NOTE - This is in addition to the fluids required for to complete your prep. Use of a flavored hard candy, such as grape Anise Salvo, can counteract some of the flavor of the prep and may  prevent some nausea.    FOLLOW UP:   After your stool studies, you will receive a call from my office staff regarding my recommendation for follow up.  BMI:  If you are age 48 or older, your body mass index should be between 23-30. Your Body mass index is 38 kg/m. If this is out of the aforementioned range listed, please consider follow up with your Primary Care Provider.  If you are age 32 or younger, your body mass index should be between 19-25. Your Body mass index is 38 kg/m. If this is out of the aformentioned range listed, please consider follow up with your Primary Care Provider.   MY CHART:  The [facility] providers would like to encourage you to use [facility] to communicate with providers for non-urgent requests or questions.  Due to long hold times on the telephone, sending your provider a message by [facility] may be a faster and more efficient way to get a response.  Please allow 48 business hours for a response.  Please remember that this is for non-urgent requests.   Thank you for trusting me with your gastrointestinal care!    Thornton Park, MD, MPH

## 2021-11-06 NOTE — Progress Notes (Signed)
Referring Provider: Horald Pollen, * Primary Care Physician:  Horald Pollen, MD   Reason for Consultation:  Rectal bleeding   IMPRESSION:  Acute on chronic diarrhea not improved by regular Imodium or Pepto-Bismol Reflux now with intermittent dysphagia to solids Recent episodes of altered awareness under evaluation by Neurology for possible focal seizures    - last possible seiziure 2 weeks ago    -Seen by neurology last 10/21/2021 with follow-up planned in 3 months Rectal bleeding attributed to internal hemorrhoids based on colonoscopy 2020 History of microcytic anemia in 2009, hemoglobin > 13 since that time    -Recent hemoglobin now 12.2 with normal MCV No known family history of colon cancer or polyps  GI symptoms may be worsened by stress  PLAN: - Start pantoprazole 40 mg BID - Consider trial of psyllium - Stool for pancreatic elastase, GI stool pathogen panel, and fecal calprotectin - Iron/anemia panel to follow-up on his microcytic anemia - EGD with possible dilation after clearance by Neurology - Colonoscopy with random colon biopsies - Per [facility] protocol, will need to wait 3 months from his last seizure prior to proceeding with anesthesia due to increased risk  I consented the patient at the bedside today discussing the risks, benefits, and alternatives to endoscopic evaluation. In particular, we discussed the risks that include, but are not limited to, reaction to medication, cardiopulmonary compromise, bleeding requiring blood transfusion, aspiration resulting in pneumonia, perforation requiring surgery, lack of diagnosis, severe illness requiring hospitalization, and even death. We reviewed the risk of missed lesion including polyps or even cancer. The patient acknowledges these risks and asks that we proceed.  HPI: [name] is a 41 y.o. who presents today for evaluation of diarrhea and reflux.  He was last seen in the office 07/13/2018 for rectal  bleeding.  Colonoscopy was performed 02/13/2019.  The interval history is obtained through the patient and review of his electronic health record.  He has anxiety, depression, type II diabetes, hypercholesterolemia, hypertension, and obesity.  He chronic back pain and had a laminectomy/discectomy L4-L5 in 2017.    Initially seen in 2020 for 3 acute episodes of nonradiating, sharp, lower abdominal cramping, and then had a bloody bowel movement last month.  At that time he filled this toilet bowl with bright red blood.  A CT of the abdomen and pelvis with contrast 06/15/18 revealed no abdominal abnormalities.  Labs in the ED when he presented with acute symptoms included a normal CMP with normal liver enzymes and normal lipase.  Hemoglobin was 13.1, MCV 80.8, RDW 13.7, platelets 300.  He was found to be fecal occult blood positive in the ED.  Colonoscopy 02/13/2019 showed internal hemorrhoids and prominent lymphoid aggregates.   He has a history of chronic diarrhea. Baseline loose stools for years. Has at least 3 BM daily for years. Occassional mucous.  However, for the last 2 months he has had profound diarrhea with up to 6 bowel movements daily with some bleeding.  He is also having near constant abdominal pain not changed with eating or movement.  The pain is sometimes worsened by defecation.    Reflux despite famotidine 40 mg TID without any change. Having some sour grapes.  No pain or difficulty when he swallows but he does have some choking. He has had episodes of dysphagia where a food bolus must be regurgitated.   Abdominal bloating. Passing gas sometimes when he thinks he should need to have a bowel movement. No early satiety.  GasEx may provide some extra relief.  No change with full dose Immodium and PeptoBismal.   He is under significant stress. 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  Neurology consultation raised concerns for  possible focal seizures.  EEG evaluation planned.  Last seen by neurology 10/21/2021 with follow-up in 3 months planned.  Weight has fluctuated with the stress.   Denies a precipitating event, trauma, close contacts with similar symptoms, changes in diet, recent travel or antibiotic use to explain the recent change in his bowel habits.    No NSAIDs since his colonoscopy.   No known family history of colon cancer or polyps.   Labs 09/27/2021 are normal except for a serum glucose of 321 and a serum calcium of 8.7, hemoglobin 12.2, MCV 83.4, RDW 13, platelets 249  Hemoglobin was 13.1 in December 2021. 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     amLODipine (NORVASC) 10 MG tablet Take 1 tablet (10 mg total) by mouth daily. 90 tablet 3   blood glucose meter kit and supplies Dispense based on patient and insurance preference. Use up to four times daily as directed. (FOR ICD-10 E10.9, E11.9). 1 each 0   buPROPion (WELLBUTRIN XL) 150 MG 24 hr tablet Take 1 tablet (150 mg total) by mouth daily. 90 tablet 3   Continuous Blood Gluc Sensor (FREESTYLE LIBRE 3 SENSOR) MISC 1 application  by Does not apply route 2 (two) times daily. Place 1 sensor on the skin every 14 days. Use to check glucose continuously 2 each 5   cyclobenzaprine (FLEXERIL) 10 MG tablet Take 10 mg by mouth 3 (three) times daily as needed for muscle spasms.     Dulaglutide (TRULICITY) 3 PT/4.6FK SOPN Inject 3 mg as directed once a week. 6 mL 5   empagliflozin (JARDIANCE) 25 MG TABS tablet Take 1 tablet (25 mg total) by mouth daily before breakfast. 90 tablet 1   fluticasone (FLONASE) 50 MCG/ACT nasal spray Place 2 sprays into both nostrils daily. 16 g 0   gabapentin (NEURONTIN) 300 MG capsule Take 1 capsule (300 mg total) by mouth 3 (three) times daily. 90 capsule 0   glipiZIDE (GLUCOTROL) 10 MG tablet Take 1 tablet (10 mg total) by mouth 2 (two) times daily before a meal. 180 tablet 3   loratadine (CLARITIN) 10 MG tablet Take 10 mg by mouth as needed for allergies.     losartan-hydrochlorothiazide (HYZAAR) 100-12.5 MG tablet Take 1 tablet by mouth daily. 90 tablet 3  Na Sulfate-K Sulfate-Mg Sulf 17.5-3.13-1.6 GM/177ML SOLN Take 1 kit by mouth once for 1 dose. 354 mL 0   omeprazole (PRILOSEC) 40 MG capsule Take 1 capsule (40 mg total) by mouth 2 (two) times daily before a meal. 180 capsule 3   rosuvastatin (CRESTOR) 20 MG tablet Take 1 tablet (20 mg total) by mouth daily. 90 tablet 3   sildenafil (VIAGRA) 100 MG tablet Take 0.5-1 tablets (50-100 mg total) by mouth daily as needed for erectile dysfunction. 5 tablet 11   No current facility-administered medications for this visit.    Allergies as of 11/06/2021 - Review Complete 11/06/2021  Allergen Reaction Noted   Amoxicillin Anaphylaxis and Other (See Comments) 09/19/2011   Penicillins Anaphylaxis and Other (See Comments) 10/20/2015   Metformin and related Other (See Comments) 04/17/2018   Other Other (See Comments) 10/16/2013   Latex Itching 01/12/2021   Zolpidem Other (See Comments) 01/10/2020    Family History  Problem Relation Age of Onset    Stroke Maternal Grandmother    Colon cancer Neg Hx    Esophageal cancer Neg Hx    Rectal cancer Neg Hx    Stomach cancer Neg Hx      Filed Weights   11/06/21 1048  Weight: (!) 304 lb (137.9 kg)    Physical Exam: Vital signs were reviewed. General:   Alert, well-nourished, pleasant and cooperative in NAD Head:  Normocephalic and atraumatic. Eyes:  Sclera clear, no icterus.   Conjunctiva pink. Mouth:  No deformity or lesions.   Neck:  Supple; no thyromegaly. Lungs:  Clear throughout to auscultation.   No wheezes.  Heart:  Regular rate and rhythm; no murmurs Abdomen:  Soft, nontender, normal bowel sounds. No rebound or guarding. No hepatosplenomegaly Rectal:  Deferred  Msk:  Symmetrical without gross deformities. Extremities:  No gross deformities or edema. Neurologic:  Alert and  oriented x4;  grossly nonfocal Skin:  No rash or bruise. Psych:  Alert and cooperative. Normal mood and affect.   Harvest Deist L. Tarri Glenn, MD, MPH [facility] 11/06/2021, 12:27 PM

## 2021-11-12 ENCOUNTER — Telehealth: Payer: Self-pay | Admitting: Neurology

## 2021-11-12 NOTE — Telephone Encounter (Signed)
[employer] [place]) call about procedure code [phone], code [phone] that was submitted Wanted to know if meant to enter as inpatient or outpatient Would like a call from the nurse.

## 2021-11-16 ENCOUNTER — Other Ambulatory Visit: Payer: 59

## 2021-11-16 DIAGNOSIS — K219 Gastro-esophageal reflux disease without esophagitis: Secondary | ICD-10-CM

## 2021-11-16 DIAGNOSIS — R1310 Dysphagia, unspecified: Secondary | ICD-10-CM

## 2021-11-16 DIAGNOSIS — R197 Diarrhea, unspecified: Secondary | ICD-10-CM

## 2021-11-16 DIAGNOSIS — K625 Hemorrhage of anus and rectum: Secondary | ICD-10-CM

## 2021-11-17 ENCOUNTER — Telehealth: Payer: Self-pay | Admitting: Neurology

## 2021-11-17 NOTE — Telephone Encounter (Signed)
I spoke to [place] at [facility]. She needed to clarify the reason our office was not completing the extended EEG. She is aware the order was for a 48-hour, at-home EEG. Our office does not have the equipment to perform this test. States this should be enough information for the medical director to review. She will call back, if further information is needed.

## 2021-11-17 NOTE — Telephone Encounter (Signed)
I spoke to [place] at [facility]. She needed to clarify the reason our office was not completing the extended EEG. She is aware the order was for a 48-hour, at-home EEG. Our office does not have the equipment to perform this test. States this should be enough information for the medical director to review. She will call back, if further information is needed.

## 2021-11-17 NOTE — Telephone Encounter (Signed)
[name], from [facility], called back again. The referral is showing up under neurologist, Dr. Chestine Spore, through [employer]. He is out-of-network. The plan's medical director would like to know if there is a reason the patient needs to be at home vs hospital EMU. The in-home company is requesting a higher rate.  If there is no preference, they will research the cost for both and let us know which service would be allowable (home vs admission).

## 2021-11-17 NOTE — Telephone Encounter (Signed)
Janett Billow from [employer] is calling about Video EEG for prior authorization. States she need more information.

## 2021-11-17 NOTE — Telephone Encounter (Signed)
FYI - Received a call from [place] at [employer], she advised that Dr. Fredda Hammed office made a mistake and they are actually in network with the patients insurance. Ok to disregard previous message.

## 2021-11-18 LAB — CALPROTECTIN, FECAL: Calprotectin, Fecal: 47 ug/g (ref 0–120)

## 2021-11-18 LAB — GI PROFILE, STOOL, PCR

## 2021-11-19 ENCOUNTER — Encounter: Payer: Self-pay | Admitting: Emergency Medicine

## 2021-11-19 ENCOUNTER — Other Ambulatory Visit: Payer: Self-pay

## 2021-11-19 ENCOUNTER — Encounter: Payer: Self-pay | Admitting: Gastroenterology

## 2021-11-20 LAB — PANCREATIC ELASTASE, FECAL: Pancreatic Elastase-1, Stool: 239 mcg/g

## 2021-11-27 ENCOUNTER — Telehealth: Payer: Self-pay | Admitting: *Deleted

## 2021-11-27 NOTE — Telephone Encounter (Signed)
PA for Jardiance submitted, awaiting response Key: [id number]

## 2021-11-27 NOTE — Telephone Encounter (Signed)
Rec'd determination med was APPROVED. Effective 11/27/21 through 11/28/2022.Marland KitchenJohny [name]

## 2021-11-27 NOTE — Telephone Encounter (Signed)
Rec'd determination med was APPROVED. Effective 11/27/21 through 11/28/2022...[name]

## 2021-11-27 NOTE — Telephone Encounter (Signed)
PA for [facility] 3 sensors submitted, awaiting on response Key: [id number]

## 2021-11-27 NOTE — Telephone Encounter (Signed)
PA for Trul[employer] submitted, awaiting response Key: [id number]

## 2021-11-27 NOTE — Telephone Encounter (Signed)
Rec'd determination med was APPROVED. Effective 11/27/21 through 11/28/2022../l,mb

## 2021-12-01 DIAGNOSIS — R569 Unspecified convulsions: Secondary | ICD-10-CM | POA: Diagnosis not present

## 2021-12-01 DIAGNOSIS — R4182 Altered mental status, unspecified: Secondary | ICD-10-CM | POA: Diagnosis not present

## 2021-12-02 DIAGNOSIS — R569 Unspecified convulsions: Secondary | ICD-10-CM | POA: Diagnosis not present

## 2021-12-02 DIAGNOSIS — R4182 Altered mental status, unspecified: Secondary | ICD-10-CM | POA: Diagnosis not present

## 2021-12-03 DIAGNOSIS — R569 Unspecified convulsions: Secondary | ICD-10-CM | POA: Diagnosis not present

## 2021-12-03 DIAGNOSIS — R4182 Altered mental status, unspecified: Secondary | ICD-10-CM | POA: Diagnosis not present

## 2021-12-09 ENCOUNTER — Encounter: Payer: Self-pay | Admitting: Neurology

## 2021-12-09 ENCOUNTER — Other Ambulatory Visit: Payer: Self-pay | Admitting: Neurology

## 2021-12-09 DIAGNOSIS — R569 Unspecified convulsions: Secondary | ICD-10-CM

## 2021-12-09 NOTE — Procedures (Signed)
History:  Clinical History : 41 y/o male with no hypertension, hyperlipidemia, diabetes mellitus here with seizure like activities.   INTERMITTENT MONITORING with VIDEO TECHNICAL SUMMARY: This AVEEG was performed using equipment provided by [employer] utilizing Bluetooth ( Trackit ) amplifiers with continuous EEGT attended video collection using encrypted remote transmission via [place] secured cellular tower network with data rates for each AVEEG performed. This is a Biomedical engineer AVEEG, obtained, according to the 10-20 international electrode placement system, reformatted digitally into referential and bipolar montages. Data was acquired with a minimum of 21 bipolar connections and sampled at a minimum rate of 250 cycles per second per channel, maximum rate of 450 cycles per second per channel and two channels for EKG. The entire VEEG study was recorded through cable and or radio telemetry for subsequent analysis. Specified epochs of the AVEEG data were identified at the direction of the subject by the depression of a push button by the patient. Each patients event file included data acquired two minutes prior to the push button activation and continuing until two minutes afterwards. AVEEG files were reviewed on [employer], Licensed Software provided by [employer] with a digital high frequency filter set at 70 Hz and a low frequency filter set at 1 Hz with a paper speed of 83m/s resulting in 10 seconds per digital page. This entire AVEEG was reviewed by the EEG Technologist. Random time samples, random sleep samples, clips, patient initiated push button files with included patient daily diary logs, EEG Technologist pruned data was reviewed and verified for accuracy and validity by the governing reading neurologist in full details. This AEEGV was fully compliant with all requirements for CPT 97500 for setup, patient education, take down and administered by an EEG  technologist.  Long-Term EEG with Video was monitored intermittently by a qualified EEG technologist for the entirety of the recording; quality check-ins were performed at a minimum of every two hours, checking and documenting real-time data and video to assure the integrity and quality of the recording (e.g., camera position, electrode integrity and impedance), and identify the need for maintenance. For intermittent monitoring, an EEG Technologist monitored no more than 12 patients concurrently. Diagnostic video was captured at least 80% of the time during the recording.  PATIENT EVENTS: Patient events noted 7/25 L HA for (assuming left side headache) with no time listed 7/26 R HA (assuming right side headache) with no time listed  TECHNOLOGIST EVENTS: No clear epileptiform activity was detected by the reviewing neurodiagnostic technologist during the recording for further evaluation.  TIME SAMPLES: 10-minutes of every two hours recorded are reviewed as random time samples.  SLEEP SAMPLES: 5-minutes of every 24 hour recorded sleep cycle are reviewed as random sleep samples.  AWAKE: At maximal level of alertness, the posterior dominant background activity was continuous, reactive, low voltage rhythm of 10 - 11 Hz. This was symmetric, well-modulated, and attenuated with eye opening. Diffuse, symmetric, frontocentral beta range activity was present.  SLEEP: N1 Sleep (Stage 1) was observed and characterized by the disappearance of alpha rhythm and the appearance of vertex activity.  N2 Sleep (Stage 2) was observed and characterized by vertex waves, K-complexes, and sleep spindles.  N3 (Stage 3) sleep was observed and characterized by high amplitude Delta activity of 20%.  REM sleep was observed.  EKG: There were no arrhythmias or abnormalities noted during this recording.   Impression: This is a normal 48 hours ambulatory video EEG. There are 2 reports of headaches, no  time stamp  provided but complete review of file did not show any seizures or abnormalities.   Alric Ran, MD [facility]

## 2021-12-10 ENCOUNTER — Encounter: Payer: Self-pay | Admitting: Neurology

## 2021-12-10 ENCOUNTER — Encounter: Payer: Self-pay | Admitting: Emergency Medicine

## 2021-12-10 DIAGNOSIS — Z008 Encounter for other general examination: Secondary | ICD-10-CM

## 2021-12-10 NOTE — Telephone Encounter (Signed)
Referral place to psych.

## 2021-12-10 NOTE — Telephone Encounter (Signed)
Thank you :)

## 2021-12-22 ENCOUNTER — Encounter: Payer: Self-pay | Admitting: Neurology

## 2021-12-22 ENCOUNTER — Encounter: Payer: Self-pay | Admitting: Emergency Medicine

## 2021-12-23 NOTE — Telephone Encounter (Signed)
Sent mychart message to patient informing him that his letter is ready for pick up. Letter will be at the front desk

## 2021-12-23 NOTE — Telephone Encounter (Signed)
Letter stating dates we saw him and what we saw him for is okay.  Thanks.

## 2022-01-04 ENCOUNTER — Encounter: Payer: Self-pay | Admitting: Neurology

## 2022-02-02 ENCOUNTER — Ambulatory Visit: Payer: 59 | Admitting: Neurology

## 2022-02-02 ENCOUNTER — Encounter: Payer: Self-pay | Admitting: Gastroenterology

## 2022-02-02 ENCOUNTER — Ambulatory Visit (AMBULATORY_SURGERY_CENTER): Payer: Medicaid Other | Admitting: Gastroenterology

## 2022-02-02 VITALS — BP 111/70 | HR 98 | Temp 96.2°F | Resp 16 | Ht 74.0 in | Wt 300.8 lb

## 2022-02-02 DIAGNOSIS — R1310 Dysphagia, unspecified: Secondary | ICD-10-CM

## 2022-02-02 DIAGNOSIS — R12 Heartburn: Secondary | ICD-10-CM

## 2022-02-02 DIAGNOSIS — K529 Noninfective gastroenteritis and colitis, unspecified: Secondary | ICD-10-CM | POA: Diagnosis not present

## 2022-02-02 DIAGNOSIS — R197 Diarrhea, unspecified: Secondary | ICD-10-CM

## 2022-02-02 DIAGNOSIS — K6389 Other specified diseases of intestine: Secondary | ICD-10-CM | POA: Diagnosis not present

## 2022-02-02 DIAGNOSIS — D132 Benign neoplasm of duodenum: Secondary | ICD-10-CM

## 2022-02-02 DIAGNOSIS — K648 Other hemorrhoids: Secondary | ICD-10-CM | POA: Diagnosis not present

## 2022-02-02 DIAGNOSIS — K317 Polyp of stomach and duodenum: Secondary | ICD-10-CM | POA: Diagnosis not present

## 2022-02-02 MED ORDER — SODIUM CHLORIDE 0.9 % IV SOLN: 500.0000 mL | Freq: Once | INTRAVENOUS | Status: DC

## 2022-02-02 NOTE — Progress Notes (Signed)
To pacu, VSS. Report to [email or web address].tb 

## 2022-02-02 NOTE — Progress Notes (Signed)
Pt's states no medical or surgical changes since previsit or office visit. 

## 2022-02-02 NOTE — Op Note (Signed)
[place] Patient Name: [name] Procedure Date: 02/02/2022 10:17 AM MRN: [id number] Endoscopist: Thornton Park MD, MD Age: 41 Referring MD:  Date of Birth: 03-15-1981 Gender: Male Account #: [id number] Procedure:                Upper GI endoscopy Indications:              Dysphagia, Heartburn, Diarrhea Medicines:                Monitored Anesthesia Care Procedure:                Pre-Anesthesia Assessment:                           - Prior to the procedure, a History and Physical                            was performed, and patient medications and                            allergies were reviewed. The patient's tolerance of                            previous anesthesia was also reviewed. The risks                            and benefits of the procedure and the sedation                            options and risks were discussed with the patient.                            All questions were answered, and informed consent                            was obtained. Prior Anticoagulants: The patient has                            taken no previous anticoagulant or antiplatelet                            agents. ASA Grade Assessment: II - A patient with                            mild systemic disease. After reviewing the risks                            and benefits, the patient was deemed in                            satisfactory condition to undergo the procedure.                           After obtaining informed consent, the endoscope was  passed under direct vision. Throughout the                            procedure, the patient's blood pressure, pulse, and                            oxygen saturations were monitored continuously. The                            GIF [id number] #[id number] was introduced through the                            mouth, and advanced to the third part of duodenum.                            The upper GI endoscopy  was accomplished without                            difficulty. The patient tolerated the procedure                            well. Scope In: Scope Out: Findings:                 The examined esophagus was normal.                           The entire examined stomach was normal.                           A single 4 mm sessile polyp with no bleeding was                            found in the second portion of the duodenum. The                            polyp was removed with a cold snare. Resection and                            retrieval were complete. Estimated blood loss was                            minimal.                           The examined duodenum was normal. Biopsies were                            taken with a cold forceps for histology. Estimated                            blood loss was minimal.                           The cardia and gastric fundus were normal on  retroflexion.                           The exam was otherwise without abnormality. Complications:            No immediate complications. Estimated Blood Loss:     Estimated blood loss was minimal. Impression:               - Normal esophagus.                           - Normal stomach.                           - A single duodenal polyp. Resected and retrieved.                           - Normal examined duodenum. Biopsied.                           - The examination was otherwise normal. Recommendation:           - Patient has a contact number available for                            emergencies. The signs and symptoms of potential                            delayed complications were discussed with the                            patient. Return to normal activities tomorrow.                            Written discharge instructions were provided to the                            patient.                           - Resume previous diet.                           - Continue  present medications.                           - Await pathology results. Thornton Park MD, MD 02/02/2022 10:47:34 AM This report has been signed electronically.

## 2022-02-02 NOTE — Progress Notes (Signed)
Referring Provider: Horald Pollen, * Primary Care Physician:  Horald Pollen, MD   Indication for EGD:  Abdominal pain, diarrhea, dysphagia Indication for Colonoscopy:  Abdominal pain, diarrhea   IMPRESSION:  Acute on chronic diarrhea not improved by regular Imodium or Pepto-Bismol Reflux now with intermittent dysphagia to solids Recent episodes of altered awareness under evaluation by Neurology for possible focal seizures    - last possible seiziure 2 weeks ago    -Seen by neurology last 10/21/2021 with follow-up planned in 3 months Rectal bleeding attributed to internal hemorrhoids based on colonoscopy 2020 History of microcytic anemia in 2009, hemoglobin > 13 since that time    -Recent hemoglobin now 12.2 with normal MCV No known family history of colon cancer or polyps Appropriate candidate for monitored anesthesia care  PLAN: EGD and Colonoscopy in the [facility] today   HPI: [name] is a 41 y.o. male presents for EGD and colonoscopy.   Initially seen in 2020 for 3 acute episodes of nonradiating, sharp, lower abdominal cramping, and then had a bloody bowel movement last month.  At that time he filled this toilet bowl with bright red blood.  A CT of the abdomen and pelvis with contrast 06/15/18 revealed no abdominal abnormalities.  Labs in the ED when he presented with acute symptoms included a normal CMP with normal liver enzymes and normal lipase.  Hemoglobin was 13.1, MCV 80.8, RDW 13.7, platelets 300.  He was found to be fecal occult blood positive in the ED.   Colonoscopy 02/13/2019 showed internal hemorrhoids and prominent lymphoid aggregates.    He has a history of chronic diarrhea. Baseline loose stools for years. Has at least 3 BM daily for years. Occassional mucous.  However, for the last 2 months he has had profound diarrhea with up to 6 bowel movements daily with some bleeding.   He is also having near constant abdominal pain not changed with eating or  movement.  The pain is sometimes worsened by defecation.     Reflux despite famotidine 40 mg TID without any change. Having some sour grapes.  No pain or difficulty when he swallows but he does have some choking. He has had episodes of dysphagia where a food bolus must be regurgitated.    Abdominal bloating. Passing gas sometimes when he thinks he should need to have a bowel movement. No early satiety.   GasEx may provide some extra relief.  No change with full dose Immodium and PeptoBismal.    He is under significant stress. 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  Neurology consultation raised concerns for possible focal seizures.  EEG evaluation planned.  Last seen by neurology 10/21/2021 with follow-up in 3 months planned.   Weight has fluctuated with the stress.    Denies a precipitating event, trauma, close contacts with similar symptoms, changes in diet, recent travel or antibiotic use to explain the recent change in his bowel habits.     No NSAIDs since his colonoscopy.    No known family history of colon cancer or polyps.    Labs 09/27/2021 are normal except for a serum glucose of 321 and a serum calcium of 8.7, hemoglobin 12.2, MCV 83.4, RDW 13, platelets 249   Hemoglobin was 13.1 in December 2021.   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 90 tablet 3   blood glucose meter kit and supplies Dispense based on patient and insurance preference. Use up to four times daily as directed. (FOR ICD-10 E10.9, E11.9). 1 each 0   buPROPion (WELLBUTRIN XL) 150 MG 24 hr tablet Take 1 tablet (150 mg total) by mouth daily. 90 tablet 3   Continuous Blood Gluc Sensor (FREESTYLE LIBRE 3 SENSOR) MISC 1 application by Does not apply route 2 (two) times daily. Place 1 sensor on the skin every 14 days. Use to check glucose continuously 2 each 5   cyclobenzaprine (FLEXERIL) 10 MG tablet Take 10 mg by mouth 3 (three) times daily as needed for muscle spasms.     Dulaglutide (TRULICITY) 3 WJ/1.9JY SOPN Inject 3 mg as directed once a week. 6 mL 5   empagliflozin (JARDIANCE) 25 MG TABS tablet Take 1 tablet (25 mg total) by mouth daily before breakfast. 90 tablet 1   gabapentin (NEURONTIN) 300 MG capsule Take 1 capsule (300 mg total) by mouth 3 (three) times daily. 90 capsule 0   loratadine (CLARITIN) 10 MG tablet Take 10 mg by mouth as needed for allergies.     losartan-hydrochlorothiazide (HYZAAR) 100-12.5 MG tablet Take 1 tablet by mouth daily. 90 tablet 3   omeprazole (PRILOSEC) 40 MG capsule Take 1 capsule (40 mg total) by mouth 2 (two) times daily before a meal. 180 capsule 3   rosuvastatin (CRESTOR) 20 MG tablet Take 1 tablet (20 mg total) by mouth daily. 90 tablet 3   acetaminophen (TYLENOL) 500 MG tablet Take 1,000 mg by mouth every 6 (six) hours as needed for mild pain.     fluticasone (FLONASE) 50 MCG/ACT nasal spray Place 2 sprays into both nostrils daily. 16 g 0   glipiZIDE (GLUCOTROL) 10 MG tablet Take 1 tablet (10 mg total) by mouth 2 (two) times daily before a meal. (Patient not taking: Reported on 02/02/2022) 180 tablet 3   sildenafil  (VIAGRA) 100 MG tablet Take 0.5-1 tablets (50-100 mg total) by mouth daily as needed for erectile dysfunction. 5 tablet 11   Current Facility-Administered Medications  Medication Dose Route Frequency Provider Last Rate Last Admin   0.9 %  sodium chloride infusion  500 mL Intravenous Once Thornton Park, MD        Allergies as of 02/02/2022 - Review Complete 02/02/2022  Allergen Reaction Noted   Amoxicillin Anaphylaxis and Other (See Comments) 09/19/2011   Penicillins Anaphylaxis and Other (See Comments) 10/20/2015   Metformin and related Other (See Comments) 04/17/2018   Other Other (See Comments) 10/16/2013   Latex Itching 01/12/2021   Zolpidem Other (See Comments) 01/10/2020    Family History  Problem Relation Age  of Onset   Stroke Maternal Grandmother    Colon cancer Neg Hx    Esophageal cancer Neg Hx    Rectal cancer Neg Hx    Stomach cancer Neg Hx      Physical Exam: General:   Alert,  well-nourished, pleasant and cooperative in NAD Head:  Normocephalic and atraumatic. Eyes:  Sclera clear, no icterus.   Conjunctiva pink. Mouth:  No deformity or lesions.   Neck:  Supple; no masses or thyromegaly. Lungs:  Clear throughout to auscultation.   No wheezes. Heart:  Regular rate and rhythm; no murmurs. Abdomen:  Soft, non-tender, nondistended, normal bowel sounds, no rebound or guarding.  Msk:  Symmetrical. No boney deformities LAD: No inguinal or umbilical LAD Extremities:  No clubbing or edema. Neurologic:  Alert and  oriented x4;  grossly nonfocal Skin:  No obvious rash or bruise. Psych:  Alert and cooperative. Normal mood and affect.     Studies/Results: No results found.    Emmelia Holdsworth L. Tarri Glenn, MD, MPH 02/02/2022, 10:16 AM

## 2022-02-02 NOTE — Patient Instructions (Addendum)
- Patient has a contact number available for emergencies. The signs and symptoms of potential delayed complications were discussed with the patient. Return to normal activities tomorrow. Written discharge instructions were provided to the patient. - Resume previous diet. - Continue present medications. - Await pathology results.  YOU HAD AN ENDOSCOPIC PROCEDURE TODAY AT [facility]:   Refer to the procedure report that was given to you for any specific questions about what was found during the examination.  If the procedure report does not answer your questions, please call your gastroenterologist to clarify.  If you requested that your care partner not be given the details of your procedure findings, then the procedure report has been included in a sealed envelope for you to review at your convenience later.  YOU SHOULD EXPECT: Some feelings of bloating in the abdomen. Passage of more gas than usual.  Walking can help get rid of the air that was put into your GI tract during the procedure and reduce the bloating. If you had a lower endoscopy (such as a colonoscopy or flexible sigmoidoscopy) you may notice spotting of blood in your stool or on the toilet paper. If you underwent a bowel prep for your procedure, you may not have a normal bowel movement for a few days.  Please Note:  You might notice some irritation and congestion in your nose or some drainage.  This is from the oxygen used during your procedure.  There is no need for concern and it should clear up in a day or so.  SYMPTOMS TO REPORT IMMEDIATELY:  Following lower endoscopy (colonoscopy or flexible sigmoidoscopy):  Excessive amounts of blood in the stool  Significant tenderness or worsening of abdominal pains  Swelling of the abdomen that is new, acute  Fever of 100F or higher  Following upper endoscopy (EGD)  Vomiting of blood or coffee ground material  New chest pain or pain under the shoulder blades  Painful or  persistently difficult swallowing  New shortness of breath  Fever of 100F or higher  Black, tarry-looking stools  For urgent or emergent issues, a gastroenterologist can be reached at any hour by calling [phone]. Do not use MyChart messaging for urgent concerns.    DIET:  We do recommend a small meal at first, but then you may proceed to your regular diet.  Drink plenty of fluids but you should avoid alcoholic beverages for 24 hours.  ACTIVITY:  You should plan to take it easy for the rest of today and you should NOT DRIVE or use heavy machinery until tomorrow (because of the sedation medicines used during the test).    FOLLOW UP: Our staff will call the number listed on your records the next business day following your procedure.  We will call around 7:15- 8:00 am to check on you and address any questions or concerns that you may have regarding the information given to you following your procedure. If we do not reach you, we will leave a message.     If any biopsies were taken you will be contacted by phone or by letter within the next 1-3 weeks.  Please call us at [phone] if you have not heard about the biopsies in 3 weeks.    SIGNATURES/CONFIDENTIALITY: You and/or your care partner have signed paperwork which will be entered into your electronic medical record.  These signatures attest to the fact that that the information above on your After Visit Summary has been reviewed and is  understood.  Full responsibility of the confidentiality of this discharge information lies with you and/or your care-partner.

## 2022-02-02 NOTE — Op Note (Signed)
[facility] Patient Name: [name] Procedure Date: 02/02/2022 10:16 AM MRN: [id number] Endoscopist: Thornton Park MD, MD Age: 41 Referring MD:  Date of Birth: 05/07/1981 Gender: Male Account #: [id number] Procedure:                Colonoscopy Indications:              Chronic diarrhea Medicines:                Monitored Anesthesia Care Procedure:                Pre-Anesthesia Assessment:                           - Prior to the procedure, a History and Physical                            was performed, and patient medications and                            allergies were reviewed. The patient's tolerance of                            previous anesthesia was also reviewed. The risks                            and benefits of the procedure and the sedation                            options and risks were discussed with the patient.                            All questions were answered, and informed consent                            was obtained. Prior Anticoagulants: The patient has                            taken no previous anticoagulant or antiplatelet                            agents. ASA Grade Assessment: II - A patient with                            mild systemic disease. After reviewing the risks                            and benefits, the patient was deemed in                            satisfactory condition to undergo the procedure.                           After obtaining informed consent, the colonoscope  was passed under direct vision. Throughout the                            procedure, the patient's blood pressure, pulse, and                            oxygen saturations were monitored continuously. The                            Olympus CF-HQ190L [id number]) Colonoscope was                            introduced through the anus and advanced to the 5                            cm into the ileum. The colonoscopy was  performed                            without difficulty. The patient tolerated the                            procedure well. The quality of the bowel                            preparation was good except for food fiber that was                            too large to suction through the colonoscope. The                            terminal ileum, ileocecal valve, appendiceal                            orifice, and rectum were photographed. Scope In: 10:34:19 AM Scope Out: 10:45:34 AM Scope Withdrawal Time: 0 hours 7 minutes 43 seconds  Total Procedure Duration: 0 hours 11 minutes 15 seconds  Findings:                 The perianal and digital rectal examinations were                            normal except for small external hemorrhoids.                           The colon (entire examined portion) appeared                            normal. Biopsies for histology were taken with a                            cold forceps from the right colon and left colon                            for evaluation of microscopic colitis.  The terminal ileum appeared normal. Biopsies were                            taken with a cold forceps for histology. Estimated                            blood loss was minimal.                           The exam was otherwise without abnormality on                            direct and retroflexion views. Complications:            No immediate complications. Estimated Blood Loss:     Estimated blood loss was minimal. Impression:               - The entire examined colon is normal. Biopsied.                           - The examined portion of the ileum was normal.                            Biopsied.                           - Small external hemorrhoids. Recommendation:           - Patient has a contact number available for                            emergencies. The signs and symptoms of potential                            delayed  complications were discussed with the                            patient. Return to normal activities tomorrow.                            Written discharge instructions were provided to the                            patient.                           - Resume previous diet.                           - Continue present medications.                           - Await pathology results. Thornton Park MD, MD 02/02/2022 10:51:38 AM This report has been signed electronically.

## 2022-02-03 ENCOUNTER — Telehealth: Payer: Self-pay | Admitting: *Deleted

## 2022-02-03 NOTE — Telephone Encounter (Signed)
  Follow up Call-     02/02/2022    9:40 AM  Call back number  Post procedure Call Back phone  # [phone]  Permission to leave phone message Yes     Patient questions:  Do you have a fever, pain , or abdominal swelling? No. Pain Score  0 *  Have you tolerated food without any problems? Yes.  Have you been able to return to your normal activities? Yes.    Do you have any questions about your discharge instructions: Diet   No. Medications  No. Follow up visit  No.  Do you have questions or concerns about your Care? No.  Actions: * If pain score is 4 or above: No action needed, pain <4.

## 2022-02-05 ENCOUNTER — Encounter: Payer: Self-pay | Admitting: Emergency Medicine

## 2022-02-16 ENCOUNTER — Telehealth: Payer: Self-pay | Admitting: Neurology

## 2022-02-16 ENCOUNTER — Encounter: Payer: Self-pay | Admitting: Neurology

## 2022-02-16 ENCOUNTER — Ambulatory Visit (INDEPENDENT_AMBULATORY_CARE_PROVIDER_SITE_OTHER): Payer: Self-pay | Admitting: Neurology

## 2022-02-16 VITALS — BP 128/89 | HR 106 | Ht 74.0 in | Wt 300.0 lb

## 2022-02-16 DIAGNOSIS — R569 Unspecified convulsions: Secondary | ICD-10-CM

## 2022-02-16 MED ORDER — OXCARBAZEPINE 150 MG PO TABS: 150.0000 mg | ORAL_TABLET | Freq: Two times a day (BID) | ORAL | 6 refills | Status: DC

## 2022-02-16 NOTE — Progress Notes (Signed)
[facility]  PATIENT: [name] DOB: 23-Feb-1981  REQUESTING CLINICIAN: [place], [place] HISTORY FROM: Patient  REASON FOR VISIT: Transient alteration of awareness   HISTORICAL  CHIEF COMPLAINT:  Chief Complaint  Patient presents with   Follow-up    Rm 12, alone  Reports a seizure 3 weeks ago when laying down, witnessed by his son    INTERVAL HISTORY 02/16/22 Patient presents today for follow-up, last visit was in June since that he had a routine EEG which was negative also have ambulatory EEG which was normal.  He continued to have events, reported last event was 3 weeks ago.  He was with his son and the son told him that he stopped talking, staring, and start talking like a baby.  He does not remember the event.  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   HISTORY OF PRESENT ILLNESS:  This is a 41 year old gentleman past medical history of hypertension, hyperlipidemia and diabetes mellitus who is presenting with 3 incidents of transient alteration of awareness.  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  The first incident was on March 26, he remembers that he was eating and dropped his food and next thing that he knows is that his coworker was telling him that he was talking differently, talking to people who were not there, and he was not making any sense.  He does not remember the incident.  He reported he was terminated from his job on May 23 because of 3 incidents that happened at work.   His letter of termination shows that on the 26 there was a EMS called that was held for 9-minutes while he was working EMS dispatch On April 19, patient received a call where his entry and change resulted in numerous errors including incorrect gender and ultimately unknown problem resulting in low enforcement being dispatched for medical reason.  The note indicated that the call resulted in  7-minute duration time with the majority of the call spent in silence.  And finally on May 20 you receive a call from a pregnant male with sharp pain, you coded this call as a sick 41 year old male.  The caller called back and reported that he sounded out of it. Due to these multiple errors, he was terminated from his job.  He reports since being home, no one has reported any additional incident.  He lives with his 2 sons.  He denies any previous history of seizures and denies any seizure risk factors, no family history of seizures.   OTHER MEDICAL CONDITIONS: DMII, HTN, HLD   REVIEW OF SYSTEMS: Full 14 system review of systems performed and negative with exception of: as noted the HPI   ALLERGIES: Allergies  Allergen Reactions   Amoxicillin Anaphylaxis and Other (See Comments)    Has patient had a PCN reaction causing immediate rash, facial/tongue/throat swelling, SOB or lightheadedness with hypotension: yes Has patient had a PCN reaction causing severe rash involving mucus membranes or skin necrosis: no Has patient had a PCN reaction that required hospitalization yes Has patient had a PCN reaction occurring within the last 10 years: yes If all of the above answers are "NO", then may proceed with Cephalosporin use.   Penicillins Anaphylaxis and Other (See Comments)    Patient was told to list this as an allergy because he is allergic to Amoxicillin Has patient had a PCN reaction causing immediate rash,  facial/tongue/throat swelling, SOB or lightheadedness with hypotension: yes for Amoxicillin Has patient had a PCN reaction causing severe rash involving mucus membranes or skin necrosis: no Has patient had a PCN reaction that required hospitalization yes for Amoxicillin Has patient had a PCN reaction occurring within the last 10 years: yes for Amoxicillin If all o   Metformin And Related Other (See Comments)    SEVERE GI UPSET   Other Other (See Comments)    Powder in some gloves -  localized itching but not allergic to latex, benadryl usually helps with this reaction   Latex Itching   Zolpidem Other (See Comments)    Passed out. And memory loss    HOME MEDICATIONS: Outpatient Medications Prior to Visit  Medication Sig Dispense Refill   acetaminophen (TYLENOL) 500 MG tablet Take 1,000 mg by mouth every 6 (six) hours as needed for mild pain.     amLODipine (NORVASC) 10 MG tablet Take 1 tablet (10 mg total) by mouth daily. 90 tablet 3   blood glucose meter kit and supplies Dispense based on patient and insurance preference. Use up to four times daily as directed. (FOR ICD-10 E10.9, E11.9). 1 each 0   buPROPion (WELLBUTRIN XL) 150 MG 24 hr tablet Take 1 tablet (150 mg total) by mouth daily. 90 tablet 3   Continuous Blood Gluc Sensor (FREESTYLE LIBRE 3 SENSOR) MISC 1 application by Does not apply route 2 (two) times daily. Place 1 sensor on the skin every 14 days. Use to check glucose continuously 2 each 5   cyclobenzaprine (FLEXERIL) 10 MG tablet Take 10 mg by mouth 3 (three) times daily as needed for muscle spasms.     Dulaglutide (TRULICITY) 3 YT/0.3TW SOPN Inject 3 mg as directed once a week. 6 mL 5   empagliflozin (JARDIANCE) 25 MG TABS tablet Take 1 tablet (25 mg total) by mouth daily before breakfast. 90 tablet 1   fluticasone (FLONASE) 50 MCG/ACT nasal spray Place 2 sprays into both nostrils daily. 16 g 0   gabapentin (NEURONTIN) 300 MG capsule Take 1 capsule (300 mg total) by mouth 3 (three) times daily. 90 capsule 0   glipiZIDE (GLUCOTROL) 10 MG tablet Take 1 tablet (10 mg total) by mouth 2 (two) times daily before a meal. 180 tablet 3   loratadine (CLARITIN) 10 MG tablet Take 10 mg by mouth as needed for allergies.     losartan-hydrochlorothiazide (HYZAAR) 100-12.5 MG tablet Take 1 tablet by mouth daily. 90 tablet 3   omeprazole (PRILOSEC) 40 MG capsule Take 1 capsule (40 mg total) by mouth 2 (two) times daily before a meal. 180 capsule 3   rosuvastatin (CRESTOR)  20 MG tablet Take 1 tablet (20 mg total) by mouth daily. 90 tablet 3   sildenafil (VIAGRA) 100 MG tablet Take 0.5-1 tablets (50-100 mg total) by mouth daily as needed for erectile dysfunction. 5 tablet 11   No facility-administered medications prior to visit.    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    PTSD (post-traumatic stress disorder)    Scoliosis    Sleep apnea     PAST SURGICAL HISTORY: Past Surgical History:  Procedure Laterality Date   LUMBAR LAMINECTOMY/DECOMPRESSION MICRODISCECTOMY Left 05/05/2016   Procedure: CENTRAL DECOMPRESSION L4-L5 AND FORAMINOTOMY FOR L4 ROOT AND L5 ROOT ON THE LEFT;  Surgeon: Ranee Gosselin, MD;  Location: [facility];  Service: Orthopedics;  Laterality: Left;   NO PAST SURGERIES      FAMILY HISTORY: Family History  Problem Relation Age of Onset   Stroke Maternal Grandmother    Colon cancer Neg Hx    Esophageal cancer Neg Hx    Rectal cancer Neg Hx    Stomach cancer Neg Hx     SOCIAL HISTORY: Social History   Socioeconomic History   Marital status: Divorced    Spouse name: Not on file   Number of children: Not on file   Years of education: Not on file   Highest education level: Not on file  Occupational History   Not on file  Tobacco Use   Smoking status: Former    Types: Cigars   Smokeless tobacco: Current    Types: Snuff  Vaping Use   Vaping Use: Never used  Substance and Sexual Activity   Alcohol use: Yes    Alcohol/week: 5.0 standard drinks of alcohol    Types: 5 Shots of liquor per week    Comment: few times year   Drug use: No   Sexual activity: Not on file  Other Topics Concern   Not on file  Social History Narrative   Not on file   Social  Determinants of Health   Financial Resource Strain: Not on file  Food Insecurity: Not on file  Transportation Needs: Not on file  Physical Activity: Not on file  Stress: Not on file  Social Connections: Not on file  Intimate Partner Violence: Not on file    PHYSICAL EXAM  GENERAL EXAM/CONSTITUTIONAL: Vitals:  Vitals:   02/16/22 1353  BP: 128/89  Pulse: (!) 106  Weight: 300 lb (136.1 kg)  Height: 6\' 2"  (1.88 m)    Body mass index is 38.52 kg/m. Wt Readings from Last 3 Encounters:  02/16/22 300 lb (136.1 kg)  02/02/22 (!) 300 lb 12.8 oz (136.4 kg)  11/06/21 (!) 304 lb (137.9 kg)   Patient is in no distress; well developed, nourished and groomed; neck is supple  EYES: Pupils round and reactive to light, Visual fields full to confrontation, Extraocular movements intacts,   MUSCULOSKELETAL: Gait, strength, tone, movements noted in Neurologic exam below  NEUROLOGIC: MENTAL STATUS:      No data to display         awake, alert, oriented to person, place and time recent and remote memory intact normal attention and concentration language fluent, comprehension intact, naming intact fund of knowledge appropriate  CRANIAL NERVE:  2nd, 3rd, 4th, 6th - pupils equal and reactive to light, visual fields full to confrontation, extraocular muscles intact, no nystagmus 5th - facial sensation symmetric 7th - facial strength symmetric 8th - hearing intact 9th - palate elevates symmetrically, uvula midline 11th - shoulder shrug symmetric 12th - tongue protrusion midline  MOTOR:  normal bulk and tone, full strength in the BUE, BLE  SENSORY:  normal and symmetric to light touch, pinprick, temperature, vibration  COORDINATION:  finger-nose-finger, fine finger movements normal  REFLEXES:  deep tendon reflexes present and symmetric  GAIT/STATION:  normal   DIAGNOSTIC DATA (LABS, IMAGING, TESTING) - I reviewed patient records, labs, notes, testing and imaging myself  where available.  Lab Results  Component Value Date   WBC 11.2 (H) 09/27/2021  HGB 12.2 (L) 09/27/2021   HCT 37.1 (L) 09/27/2021   MCV 83.4 09/27/2021   PLT 249 09/27/2021      Component Value Date/Time   NA 139 09/27/2021 0041   NA 137 06/10/2020 0957   K 3.8 09/27/2021 0041   CL 105 09/27/2021 0041   CO2 27 09/27/2021 0041   GLUCOSE 329 (H) 09/27/2021 0041   BUN 17 09/27/2021 0041   BUN 11 06/10/2020 0957   CREATININE 1.15 09/27/2021 0041   CREATININE 0.76 12/26/2014 1055   CALCIUM 8.7 (L) 09/27/2021 0041   PROT 7.3 09/27/2021 0041   PROT 7.4 06/10/2020 0957   ALBUMIN 3.7 09/27/2021 0041   ALBUMIN 4.4 06/10/2020 0957   AST 20 09/27/2021 0041   ALT 28 09/27/2021 0041   ALKPHOS 56 09/27/2021 0041   BILITOT 0.5 09/27/2021 0041   BILITOT 0.4 06/10/2020 0957   GFRNONAA >60 09/27/2021 0041   GFRNONAA >89 12/26/2014 1055   GFRAA 121 06/10/2020 0957   GFRAA >89 12/26/2014 1055   Lab Results  Component Value Date   CHOL 171 04/23/2021   HDL 40.20 04/23/2021   LDLCALC 100 (H) 04/23/2021   LDLDIRECT 76.9 01/16/2019   TRIG 156.0 (H) 04/23/2021   CHOLHDL 4 04/23/2021   Lab Results  Component Value Date   HGBA1C 9.8 (A) 10/27/2021   Lab Results  Component Value Date   VITAMINB12 354 03/26/2008   Lab Results  Component Value Date   TSH 2.359 07/11/2014    CT Head 09/27/21:  Unremarkable noncontrast CT of the brain  Routine EEG 11/04/21 Normal   Ambulatory EEG 12/09/21 This is a normal 48 hours ambulatory video EEG. There are 2 reports of headaches, no time stamp provided but complete review of file did not show any seizures or abnormalities.   ASSESSMENT AND PLAN  41 y.o. year old male with history of hypertension, hyperlipidemia, diabetes mellitus who is presenting for follow up of 3 episodes of altered awareness.  Patient does not have any recollection of these events but these have resulted in him losing his job.  He had another event 3 weeks ago witnessed by  his son. At present, I will start him on Trileptal 150 mg BID and also referred him to the Epilepsy monitoring Unit for capture and characterization. This was explained to the patient and he voices understanding. Follow up in 6 months and patient also understand to contact me if he has anther event.    1. Seizure-like activity ([place])      Patient Instructions  Start with Trileptal 150 mg twice daily Referral to EMU for capture and characterization Follow-up in 6 months or sooner if worse Contact me if you have breakthrough event  Orders Placed This Encounter  Procedures   Ambulatory referral to Neurology    Meds ordered this encounter  Medications   OXcarbazepine (TRILEPTAL) 150 MG tablet    Sig: Take 1 tablet (150 mg total) by mouth 2 (two) times daily.    Dispense:  60 tablet    Refill:  6    Return in about 6 months (around 08/18/2022).  I have spent a total of 50 minutes dedicated to this patient today, preparing to see patient, performing a medically appropriate examination and evaluation, ordering tests and/or medications and procedures, and counseling and educating the patient/family/caregiver; independently interpreting result and communicating results to the family/patient/caregiver; and documenting clinical information in the electronic medical record.   Alric Ran, MD 02/16/2022, 2:15 PM  [facility]  [address], [place], [place] [address] [phone]

## 2022-02-16 NOTE — Patient Instructions (Signed)
Start with Trileptal 150 mg twice daily Referral to EMU for capture and characterization Follow-up in 6 months or sooner if worse Contact me if you have breakthrough event

## 2022-02-16 NOTE — Telephone Encounter (Signed)
Referral for Neurology sent to [facility] to see Dr. Zeb Comfort. Phone: [phone], Fax: [phone]

## 2022-02-18 ENCOUNTER — Ambulatory Visit (INDEPENDENT_AMBULATORY_CARE_PROVIDER_SITE_OTHER): Payer: BC Managed Care – PPO | Admitting: Student

## 2022-02-18 VITALS — BP 130/90 | HR 98 | Temp 98.6°F | Resp 16 | Ht 74.0 in | Wt 304.0 lb

## 2022-02-18 DIAGNOSIS — F321 Major depressive disorder, single episode, moderate: Secondary | ICD-10-CM | POA: Diagnosis not present

## 2022-02-18 MED ORDER — VENLAFAXINE HCL ER 75 MG PO CP24: ORAL_CAPSULE | ORAL | 2 refills | Status: DC

## 2022-02-19 ENCOUNTER — Encounter (HOSPITAL_COMMUNITY): Payer: Self-pay | Admitting: Student
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
## 2022-03-08 ENCOUNTER — Ambulatory Visit: Payer: BC Managed Care – PPO | Admitting: Emergency Medicine

## 2022-03-08 ENCOUNTER — Encounter: Payer: Self-pay | Admitting: Emergency Medicine

## 2022-03-08 VITALS — BP 128/84 | HR 104 | Temp 98.2°F | Ht 74.0 in | Wt 297.1 lb

## 2022-03-08 DIAGNOSIS — E1159 Type 2 diabetes mellitus with other circulatory complications: Secondary | ICD-10-CM | POA: Diagnosis not present

## 2022-03-08 DIAGNOSIS — F341 Dysthymic disorder: Secondary | ICD-10-CM

## 2022-03-08 DIAGNOSIS — N5201 Erectile dysfunction due to arterial insufficiency: Secondary | ICD-10-CM | POA: Diagnosis not present

## 2022-03-08 DIAGNOSIS — I152 Hypertension secondary to endocrine disorders: Secondary | ICD-10-CM | POA: Diagnosis not present

## 2022-03-08 DIAGNOSIS — E1169 Type 2 diabetes mellitus with other specified complication: Secondary | ICD-10-CM

## 2022-03-08 DIAGNOSIS — Z23 Encounter for immunization: Secondary | ICD-10-CM | POA: Diagnosis not present

## 2022-03-08 DIAGNOSIS — R569 Unspecified convulsions: Secondary | ICD-10-CM

## 2022-03-08 DIAGNOSIS — E785 Hyperlipidemia, unspecified: Secondary | ICD-10-CM

## 2022-03-08 LAB — POCT GLYCOSYLATED HEMOGLOBIN (HGB A1C): Hemoglobin A1C: 10.1 % — AB (ref 4.0–5.6)

## 2022-03-08 MED ORDER — DAPAGLIFLOZIN PROPANEDIOL 10 MG PO TABS: 10.0000 mg | ORAL_TABLET | Freq: Every day | ORAL | 3 refills | Status: DC

## 2022-03-08 MED ORDER — CYCLOBENZAPRINE HCL 10 MG PO TABS: 10.0000 mg | ORAL_TABLET | Freq: Three times a day (TID) | ORAL | 1 refills | Status: DC | PRN

## 2022-03-08 MED ORDER — TRULICITY 4.5 MG/0.5ML ~~LOC~~ SOAJ: 4.5000 mg | SUBCUTANEOUS | 3 refills | Status: DC

## 2022-03-08 MED ORDER — TADALAFIL 20 MG PO TABS: 10.0000 mg | ORAL_TABLET | ORAL | 11 refills | Status: AC | PRN

## 2022-03-08 NOTE — Assessment & Plan Note (Signed)
Diet and nutrition discussed.  Continue rosuvastatin 20 mg daily. 

## 2022-03-08 NOTE — Assessment & Plan Note (Signed)
Was able to establish care with psychiatrist. Recently started on Effexor 75 mg daily. Stable.  No concerns.

## 2022-03-08 NOTE — Assessment & Plan Note (Signed)
Well-controlled hypertension. Continue amlodipine 10 mg and Hyzaar 100-12.5 mg daily Uncontrolled diabetes with hemoglobin A1c at 10.1. Cardiovascular risks associated with uncontrolled diabetes discussed. Diet and nutrition discussed. Recommend to continue glipizide 10 mg twice a day and increase Trulicity to 4.5 mg weekly. Start Farxiga 10 mg daily if covered by insurance. Follow-up in 3 months.

## 2022-03-08 NOTE — Patient Instructions (Signed)

## 2022-03-08 NOTE — Assessment & Plan Note (Signed)
Stable.  Recently started on Trileptal 150 mg twice a day.  Tolerating medication well.  No concerns.

## 2022-03-08 NOTE — Progress Notes (Signed)
[name] 41 y.o.   Chief Complaint  Patient presents with  . Follow-up    F/u appt, no concerns     HISTORY OF PRESENT ILLNESS: This is a 41 y.o. male with history of diabetes and hypertension here for follow-up Also recently diagnosed with seizure disorder and started on Trileptal 150 mg twice a day Also recently started on Effexor for depression and anxiety Overall doing well.  Eating better and losing weight On Trulicity 3 mg weekly and glipizide 10 mg twice a day Insurance did not cover Jardiance Also has issues with erectile dysfunction and Viagra not working BP Readings from Last 3 Encounters:  03/08/22 128/84  02/16/22 128/89  02/02/22 111/70   Lab Results  Component Value Date   HGBA1C 9.8 (A) 10/27/2021   Wt Readings from Last 3 Encounters:  03/08/22 297 lb 2 oz (134.8 kg)  02/16/22 300 lb (136.1 kg)  02/02/22 (!) 300 lb 12.8 oz (136.4 kg)  Most recent neurology office visit assessment and plan as follows:  ASSESSMENT AND PLAN   41 y.o. year old male with history of hypertension, hyperlipidemia, diabetes mellitus who is presenting for follow up of 3 episodes of altered awareness.  Patient does not have any recollection of these events but these have resulted in him losing his job.  He had another event 3 weeks ago witnessed by his son. At present, I will start him on Trileptal 150 mg BID and also referred him to the Epilepsy monitoring Unit for capture and characterization. This was explained to the patient and he voices understanding. Follow up in 6 months and patient also understand to contact me if he has anther event.      1. Seizure-like activity [facility])         Patient Instructions  Start with Trileptal 150 mg twice daily Referral to EMU for capture and characterization Follow-up in 6 months or sooner if worse Contact me if you have breakthrough event Orders Placed This Encounter  Procedures  . Ambulatory referral to Neurology          Meds  ordered this encounter  Medications  . OXcarbazepine (TRILEPTAL) 150 MG tablet      Sig: Take 1 tablet (150 mg total) by mouth 2 (two) times daily.      Dispense:  60 tablet      Refill:  6      Return in about 6 months (around 08/18/2022).   I have spent a total of 50 minutes dedicated to this patient today, preparing to see patient, performing a medically appropriate examination and evaluation, ordering tests and/or medications and procedures, and counseling and educating the patient/family/caregiver; independently interpreting result and communicating results to the family/patient/caregiver; and documenting clinical information in the electronic medical record.     Alric Ran, MD 02/16/2022, 2:15 PM   [facility] [address] [place], [place] [address] [phone]  HPI   Prior to Admission medications   Medication Sig Start Date End Date Taking? Authorizing Provider  acetaminophen (TYLENOL) 500 MG tablet Take 1,000 mg by mouth every 6 (six) hours as needed for mild pain.   Yes [provider]  amLODipine (NORVASC) 10 MG tablet Take 1 tablet (10 mg total) by mouth daily. 09/08/21 09/03/22 Yes Aileen Amore, Ines Bloomer, MD  blood glucose meter kit and supplies Dispense based on patient and insurance preference. Use up to four times daily as directed. (FOR ICD-10 E10.9, E11.9). 01/16/19  Yes Eugenie Filler,  MD  buPROPion (WELLBUTRIN XL) 150 MG 24 hr tablet Take 1 tablet (150 mg total) by mouth daily. 09/08/21 09/03/22 Yes Trenden Hazelrigg, Ines Bloomer, MD  Continuous Blood Gluc Sensor (FREESTYLE LIBRE 3 SENSOR) MISC 1 application by Does not apply route 2 (two) times daily. Place 1 sensor on the skin every 14 days. Use to check glucose continuously 04/23/21  Yes Kalany Diekmann, Ines Bloomer, MD  cyclobenzaprine (FLEXERIL) 10 MG tablet Take 10 mg by mouth 3 (three) times daily as needed for muscle spasms.   Yes [provider]  Dulaglutide (TRULICITY) 3  ZH/0.8MV SOPN Inject 3 mg as directed once a week. 04/23/21  Yes Sabreen Kitchen, Ines Bloomer, MD  empagliflozin (JARDIANCE) 25 MG TABS tablet Take 1 tablet (25 mg total) by mouth daily before breakfast. 10/27/21 04/25/22 Yes Clarice Zulauf, Ines Bloomer, MD  fluticasone [facility]) 50 MCG/ACT nasal spray Place 2 sprays into both nostrils daily. 01/14/21  Yes Apolonio Schneiders, FNP  gabapentin (NEURONTIN) 300 MG capsule Take 1 capsule (300 mg total) by mouth 3 (three) times daily. 06/14/21  Yes Hawks, Christy A, FNP  glipiZIDE (GLUCOTROL) 10 MG tablet Take 1 tablet (10 mg total) by mouth 2 (two) times daily before a meal. 09/08/21 09/03/22 Yes Addylin Manke, Ines Bloomer, MD  loratadine (CLARITIN) 10 MG tablet Take 10 mg by mouth as needed for allergies.   Yes [provider]  losartan-hydrochlorothiazide (HYZAAR) 100-12.5 MG tablet Take 1 tablet by mouth daily. 09/08/21 09/03/22 Yes Brailyn Delman, Ines Bloomer, MD  omeprazole (PRILOSEC) 40 MG capsule Take 1 capsule (40 mg total) by mouth 2 (two) times daily before a meal. 11/06/21  Yes Thornton Park, MD  OXcarbazepine (TRILEPTAL) 150 MG tablet Take 1 tablet (150 mg total) by mouth 2 (two) times daily. 02/16/22 09/14/22 Yes Camara, Maryan Puls, MD  rosuvastatin (CRESTOR) 20 MG tablet Take 1 tablet (20 mg total) by mouth daily. 09/08/21 09/03/22 Yes Catricia Scheerer, Ines Bloomer, MD  sildenafil (VIAGRA) 100 MG tablet Take 0.5-1 tablets (50-100 mg total) by mouth daily as needed for erectile dysfunction. 04/23/21  Yes Horald Pollen, MD  venlafaxine XR (EFFEXOR XR) 75 MG 24 hr capsule Take half a tablet (37.5 mg) daily for seven days, then take one tablet (75 mg) daily thereafter. 02/18/22  Yes Corky Sox, MD    Allergies  Allergen Reactions  . Amoxicillin Anaphylaxis and Other (See Comments)    Has patient had a PCN reaction causing immediate rash, facial/tongue/throat swelling, SOB or lightheadedness with hypotension: yes Has patient had a PCN reaction causing severe rash involving  mucus membranes or skin necrosis: no Has patient had a PCN reaction that required hospitalization yes Has patient had a PCN reaction occurring within the last 10 years: yes If all of the above answers are "NO", then may proceed with Cephalosporin use.  [name] Penicillins Anaphylaxis and Other (See Comments)    Patient was told to list this as an allergy because he is allergic to Amoxicillin Has patient had a PCN reaction causing immediate rash, facial/tongue/throat swelling, SOB or lightheadedness with hypotension: yes for Amoxicillin Has patient had a PCN reaction causing severe rash involving mucus membranes or skin necrosis: no Has patient had a PCN reaction that required hospitalization yes for Amoxicillin Has patient had a PCN reaction occurring within the last 10 years: yes for Amoxicillin If all o  . Metformin And Related Other (See Comments)    SEVERE GI UPSET  . Other Other (See Comments)    Powder in some gloves - localized itching but not  allergic to latex, benadryl usually helps with this reaction  . Latex Itching  . Zolpidem Other (See Comments)    Passed out. And memory loss    Patient Active Problem List   Diagnosis Date Noted  . Subungual hematoma of lesser toe 10/27/2021  . Bilateral swelling of feet and ankles 09/30/2021  . Chronic diarrhea 09/30/2021  . Near syncope 09/30/2021  . Medication side effects 09/01/2021  . Viral gastroenteritis 07/22/2021  . Cervical disc disease 09/18/2020  . Neck pain 04/17/2020  . Diabetic ulcer of left midfoot associated with type 2 diabetes mellitus ([place]) 02/07/2020  . Morbid obesity, unspecified obesity type ([place]) 01/10/2020  . Hypertension associated with diabetes ([place]) 04/17/2018  . Dyslipidemia 04/17/2018  . PTSD (post-traumatic stress disorder) 08/11/2016  . Spinal stenosis, lumbar region with neurogenic claudication 05/05/2016  . Dysthymia 09/02/2015  . Chronic low back pain 09/02/2015  . Dyslipidemia associated with type 2  diabetes mellitus ([place]) 03/11/2008  . UNSPECIFIED ANEMIA 03/11/2008    Past Medical History:  Diagnosis Date  . Allergies   . Allergy   . Anemia   . Anxiety   . Depression   . Diabetes mellitus   . GERD (gastroesophageal reflux disease)   . Hyperlipidemia   . Hypertension   . Lumbar disc herniation   . 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 PTSD (post-traumatic stress disorder)   . Scoliosis   . Sleep apnea     Past Surgical History:  Procedure Laterality Date  . LUMBAR LAMINECTOMY/DECOMPRESSION MICRODISCECTOMY Left 05/05/2016   Procedure: CENTRAL DECOMPRESSION L4-L5 AND FORAMINOTOMY FOR L4 ROOT AND L5 ROOT ON THE LEFT;  Surgeon: Latanya Maudlin, MD;  Location: [facility]S;  Service: Orthopedics;  Laterality: Left;  . NO PAST SURGERIES      Social History   Socioeconomic History  . Marital status: Divorced    Spouse name: Not on file  . Number of children: Not on file  . Years of education: Not on file  . Highest education level: Not on file  Occupational History  . Not on file  Tobacco Use  . Smoking status: Former    Types: Cigars  . Smokeless tobacco: Current    Types: Snuff  Vaping Use  . Vaping Use: Never used  Substance and Sexual Activity  . Alcohol use: Yes    Alcohol/week: 5.0 standard drinks of alcohol    Types: 5 Shots of liquor per week    Comment: few times year  . Drug use: No  . Sexual activity: Not on file  Other Topics Concern  . Not on file  Social History Narrative  . Not on file   Social Determinants of Health   Financial Resource Strain: Not on file  Food Insecurity: Not on file  Transportation Needs: Not on file  Physical Activity: Not on file  Stress: Not on file  Social Connections: Not on file  Intimate Partner Violence: Not on file    Family History  Problem Relation Age of Onset  . Stroke Maternal Grandmother   . Colon cancer Neg Hx   . Esophageal cancer Neg Hx   . Rectal cancer Neg Hx   .  Stomach cancer Neg Hx      Review of Systems  Constitutional: Negative.  Negative for chills and fever.  HENT: Negative.  Negative for congestion and sore throat.   Respiratory: Negative.  Negative for cough and shortness of breath.   Cardiovascular: Negative.  Negative for chest pain and palpitations.  Gastrointestinal:  Negative for abdominal pain, diarrhea, nausea and vomiting.  Genitourinary: Negative.  Negative for dysuria and hematuria.  Skin: Negative.  Negative for rash.  Neurological: Negative.  Negative for dizziness and headaches.  All other systems reviewed and are negative.  Today's Vitals   03/08/22 1442  BP: 128/84  Pulse: (!) 104  Temp: 98.2 F (36.8 C)  TempSrc: Oral  SpO2: 97%  Weight: 297 lb 2 oz (134.8 kg)  Height: _0  (1.88 m)   Body mass index is 38.15 kg/m.   Physical Exam Vitals reviewed.  Constitutional:      Appearance: Normal appearance.  HENT:     Head: Normocephalic.     Mouth/Throat:     Mouth: Mucous membranes are moist.     Pharynx: Oropharynx is clear.  Eyes:     Extraocular Movements: Extraocular movements intact.     Conjunctiva/sclera: Conjunctivae normal.     Pupils: Pupils are equal, round, and reactive to light.  Cardiovascular:     Rate and Rhythm: Normal rate and regular rhythm.     Pulses: Normal pulses.     Heart sounds: Normal heart sounds.  Pulmonary:     Effort: Pulmonary effort is normal.     Breath sounds: Normal breath sounds.  Abdominal:     Palpations: Abdomen is soft.     Tenderness: There is no abdominal tenderness.  Musculoskeletal:     Cervical back: No tenderness.  Lymphadenopathy:     Cervical: No cervical adenopathy.  Skin:    General: Skin is warm and dry.  Neurological:     General: No focal deficit present.     Mental Status: He is alert and oriented to person, place, and time.  Psychiatric:        Mood and Affect: Mood normal.        Behavior: Behavior normal.   Results for orders placed  or performed in visit on 03/08/22 (from the past 24 hour(s))  POCT HgB A1C     Status: Abnormal   Collection Time: 03/08/22  3:13 PM  Result Value Ref Range   Hemoglobin A1C 10.1 (A) 4.0 - 5.6 %   HbA1c POC (<> result, manual entry)     HbA1c, POC (prediabetic range)     HbA1c, POC (controlled diabetic range)       ASSESSMENT & PLAN: A total of 47 minutes was spent with the patient and counseling/coordination of care regarding preparing for this visit, review of most recent office visit notes, review of most recent blood work results including interpretation of today's hemoglobin A1c, review of multiple chronic medical problems and their management, cardiovascular risk associated with uncontrolled diabetes, review of all medications and changes made, education on nutrition, prognosis, documentation, and need for follow-up.  Problem List Items Addressed This Visit       Cardiovascular and Mediastinum   Hypertension associated with diabetes ([place]) - Primary    Well-controlled hypertension. Continue amlodipine 10 mg and Hyzaar 100-12.5 mg daily Uncontrolled diabetes with hemoglobin A1c at 10.1. Cardiovascular risks associated with uncontrolled diabetes discussed. Diet and nutrition discussed. Recommend to continue glipizide 10 mg twice a day and increase Trulicity to 4.5 mg weekly. Start Farxiga 10 mg daily if covered by insurance. Follow-up in 3 months.      Relevant Medications   tadalafil (CIALIS) 20 MG tablet   Dulaglutide (TRULICITY) 4.5 GH/8.2XH SOPN   dapagliflozin propanediol (FARXIGA) 10 MG TABS tablet  Other Relevant Orders   POCT HgB A1C (Completed)     Endocrine   Dyslipidemia associated with type 2 diabetes mellitus ([place])    Diet and nutrition discussed. Continue rosuvastatin 20 mg daily.       Relevant Medications   Dulaglutide (TRULICITY) 4.5 IW/9.7LG SOPN   dapagliflozin propanediol (FARXIGA) 10 MG TABS tablet     Other   Dysthymia    Was able to  establish care with psychiatrist. Recently started on Effexor 75 mg daily. Stable.  No concerns.      Seizure-like activity ([facility])    Stable.  Recently started on Trileptal 150 mg twice a day.  Tolerating medication well.  No concerns.      Other Visit Diagnoses     Erectile dysfunction due to arterial insufficiency       Relevant Medications   tadalafil (CIALIS) 20 MG tablet   Need for vaccination       Relevant Orders   Flu Vaccine QUAD 6+ mos PF IM (Fluarix Quad PF) (Completed)      Patient Instructions  Diabetes Mellitus and Nutrition, Adult When you have diabetes, or diabetes mellitus, it is very important to have healthy eating habits because your blood sugar (glucose) levels are greatly affected by what you eat and drink. Eating healthy foods in the right amounts, at about the same times every day, can help you: Manage your blood glucose. Lower your risk of heart disease. Improve your blood pressure. Reach or maintain a healthy weight. What can affect my meal plan? Every person with diabetes is different, and each person has different needs for a meal plan. Your health care provider may recommend that you work with a dietitian to make a meal plan that is best for you. Your meal plan may vary depending on factors such as: The calories you need. The medicines you take. Your weight. Your blood glucose, blood pressure, and cholesterol levels. Your activity level. Other health conditions you have, such as heart or kidney disease. How do carbohydrates affect me? Carbohydrates, also called carbs, affect your blood glucose level more than any other type of food. Eating carbs raises the amount of glucose in your blood. It is important to know how many carbs you can safely have in each meal. This is different for every person. Your dietitian can help you calculate how many carbs you should have at each meal and for each snack. How does alcohol affect me? Alcohol can cause a  decrease in blood glucose (hypoglycemia), especially if you use insulin or take certain diabetes medicines by mouth. Hypoglycemia can be a life-threatening condition. Symptoms of hypoglycemia, such as sleepiness, dizziness, and confusion, are similar to symptoms of having too much alcohol. Do not drink alcohol if: Your health care provider tells you not to drink. You are pregnant, may be pregnant, or are planning to become pregnant. If you drink alcohol: Limit how much you have to: 0-1 drink a day for women. 0-2 drinks a day for men. Know how much alcohol is in your drink. In the [place] one drink equals one 12 oz bottle of beer (355 mL), one 5 oz glass of wine (148 mL), or one 1 oz glass of hard liquor (44 mL). Keep yourself hydrated with water, diet soda, or unsweetened iced tea. Keep in mind that regular soda, juice, and other mixers may contain a lot of sugar and must be counted as carbs. What are tips for following this plan?  Reading food labels Start  by checking the serving size on the Nutrition Facts label of packaged foods and drinks. The number of calories and the amount of carbs, fats, and other nutrients listed on the label are based on one serving of the item. Many items contain more than one serving per package. Check the total grams (g) of carbs in one serving. Check the number of grams of saturated fats and trans fats in one serving. Choose foods that have a low amount or none of these fats. Check the number of milligrams (mg) of salt (sodium) in one serving. Most people should limit total sodium intake to less than 2,300 mg per day. Always check the nutrition information of foods labeled as "low-fat" or "nonfat." These foods may be higher in added sugar or refined carbs and should be avoided. Talk to your dietitian to identify your daily goals for nutrients listed on the label. Shopping Avoid buying canned, pre-made, or processed foods. These foods tend to be high in fat, sodium,  and added sugar. Shop around the outside edge of the grocery store. This is where you will most often find fresh fruits and vegetables, bulk grains, fresh meats, and fresh dairy products. Cooking Use low-heat cooking methods, such as baking, instead of high-heat cooking methods, such as deep frying. Cook using healthy oils, such as olive, canola, or sunflower oil. Avoid cooking with butter, cream, or high-fat meats. Meal planning Eat meals and snacks regularly, preferably at the same times every day. Avoid going long periods of time without eating. Eat foods that are high in fiber, such as fresh fruits, vegetables, beans, and whole grains. Eat 4-6 oz (112-168 g) of lean protein each day, such as lean meat, chicken, fish, eggs, or tofu. One ounce (oz) (28 g) of lean protein is equal to: 1 oz (28 g) of meat, chicken, or fish. 1 egg.  cup (62 g) of tofu. Eat some foods each day that contain healthy fats, such as avocado, nuts, seeds, and fish. What foods should I eat? Fruits Berries. Apples. Oranges. Peaches. Apricots. Plums. Grapes. Mangoes. Papayas. Pomegranates. Kiwi. Cherries. Vegetables Leafy greens, including lettuce, spinach, kale, chard, collard greens, mustard greens, and cabbage. Beets. Cauliflower. Broccoli. Carrots. Green beans. Tomatoes. Peppers. Onions. Cucumbers. Brussels sprouts. Grains Whole grains, such as whole-wheat or whole-grain bread, crackers, tortillas, cereal, and pasta. Unsweetened oatmeal. Quinoa. Brown or wild rice. Meats and other proteins Seafood. Poultry without skin. Lean cuts of poultry and beef. Tofu. Nuts. Seeds. Dairy Low-fat or fat-free dairy products such as milk, yogurt, and cheese. The items listed above may not be a complete list of foods and beverages you can eat and drink. Contact a dietitian for more information. What foods should I avoid? Fruits Fruits canned with syrup. Vegetables Canned vegetables. Frozen vegetables with butter or cream  sauce. Grains Refined white flour and flour products such as bread, pasta, snack foods, and cereals. Avoid all processed foods. Meats and other proteins Fatty cuts of meat. Poultry with skin. Breaded or fried meats. Processed meat. Avoid saturated fats. Dairy Full-fat yogurt, cheese, or milk. Beverages Sweetened drinks, such as soda or iced tea. The items listed above may not be a complete list of foods and beverages you should avoid. Contact a dietitian for more information. Questions to ask a health care provider Do I need to meet with a certified diabetes care and education specialist? Do I need to meet with a dietitian? What number can I call if I have questions? When are the best times to  check my blood glucose? Where to find more information: American Diabetes Association: diabetes.org Academy of Nutrition and Dietetics: eatright.[employer] of Diabetes and Digestive and Kidney Diseases: [employer] Association of Diabetes Care & Education Specialists: diabeteseducator.org Summary It is important to have healthy eating habits because your blood sugar (glucose) levels are greatly affected by what you eat and drink. It is important to use alcohol carefully. A healthy meal plan will help you manage your blood glucose and lower your risk of heart disease. Your health care provider may recommend that you work with a dietitian to make a meal plan that is best for you. This information is not intended to replace advice given to you by your health care provider. Make sure you discuss any questions you have with your health care provider. Document Revised: 11/28/2019 Document Reviewed: 11/28/2019 Elsevier Patient Education  [place], MD [facility]

## 2022-03-11 ENCOUNTER — Encounter: Payer: Self-pay | Admitting: Neurology

## 2022-03-25 ENCOUNTER — Telehealth (HOSPITAL_BASED_OUTPATIENT_CLINIC_OR_DEPARTMENT_OTHER): Payer: BC Managed Care – PPO | Admitting: Psychiatry

## 2022-03-25 ENCOUNTER — Encounter (HOSPITAL_COMMUNITY): Payer: Self-pay | Admitting: Psychiatry

## 2022-03-25 DIAGNOSIS — F321 Major depressive disorder, single episode, moderate: Secondary | ICD-10-CM | POA: Diagnosis not present

## 2022-03-25 MED ORDER — VENLAFAXINE HCL ER 75 MG PO CP24: 75.0000 mg | ORAL_CAPSULE | Freq: Every day | ORAL | 1 refills | Status: DC

## 2022-03-25 MED ORDER — VENLAFAXINE HCL ER 37.5 MG PO CP24: 37.5000 mg | ORAL_CAPSULE | Freq: Every day | ORAL | 1 refills | Status: DC
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
## 2022-04-17 ENCOUNTER — Other Ambulatory Visit (HOSPITAL_COMMUNITY): Payer: Self-pay | Admitting: Psychiatry

## 2022-04-17 DIAGNOSIS — F321 Major depressive disorder, single episode, moderate: Secondary | ICD-10-CM

## 2022-04-21 ENCOUNTER — Ambulatory Visit (HOSPITAL_COMMUNITY): Payer: BC Managed Care – PPO | Admitting: Psychiatry

## 2022-04-26 ENCOUNTER — Ambulatory Visit (HOSPITAL_COMMUNITY): Payer: BC Managed Care – PPO | Admitting: Psychiatry

## 2022-04-28 ENCOUNTER — Other Ambulatory Visit (HOSPITAL_COMMUNITY): Payer: Self-pay | Admitting: Psychiatry

## 2022-04-28 DIAGNOSIS — F321 Major depressive disorder, single episode, moderate: Secondary | ICD-10-CM

## 2022-04-29 ENCOUNTER — Other Ambulatory Visit (HOSPITAL_COMMUNITY): Payer: Self-pay | Admitting: *Deleted

## 2022-04-29 DIAGNOSIS — F321 Major depressive disorder, single episode, moderate: Secondary | ICD-10-CM

## 2022-04-30 ENCOUNTER — Other Ambulatory Visit (HOSPITAL_COMMUNITY): Payer: Self-pay | Admitting: Psychiatry

## 2022-04-30 DIAGNOSIS — F321 Major depressive disorder, single episode, moderate: Secondary | ICD-10-CM

## 2022-05-05 ENCOUNTER — Telehealth: Payer: BC Managed Care – PPO | Admitting: Physician Assistant

## 2022-05-05 ENCOUNTER — Other Ambulatory Visit: Payer: Self-pay | Admitting: Emergency Medicine

## 2022-05-05 DIAGNOSIS — Z20818 Contact with and (suspected) exposure to other bacterial communicable diseases: Secondary | ICD-10-CM

## 2022-05-05 DIAGNOSIS — J029 Acute pharyngitis, unspecified: Secondary | ICD-10-CM | POA: Diagnosis not present

## 2022-05-05 MED ORDER — AZITHROMYCIN 250 MG PO TABS: ORAL_TABLET | ORAL | 0 refills | Status: AC

## 2022-05-05 NOTE — Progress Notes (Signed)
Of course. I will have it to cover today and tomorrow, if needed.  I have sent a work note to Doctor, hospital. You can find by going to the Menu on your homepage, scrolling down to the Communications section, and selecting Letters. Let us know if you have any issue locating. Take care and feel better soon!

## 2022-05-05 NOTE — Progress Notes (Signed)
I have spent 5 minutes in review of e-visit questionnaire, review and updating patient chart, medical decision making and response to patient.   Soraiya Ahner Cody Angie Piercey, PA-C    

## 2022-05-05 NOTE — Progress Notes (Signed)
E-Visit for Sore Throat - Strep Symptoms  We are sorry that you are not feeling well.  Here is how we plan to help!  Based on what you have shared with me it is likely that you have strep pharyngitis.  Strep pharyngitis is inflammation and infection in the back of the throat.  This is an infection cause by bacteria and is treated with antibiotics.  I have prescribed Azithromycin 250 mg two tablets today and then one daily for 4 additional days. For throat pain, we recommend over the counter oral pain relief medications such as acetaminophen or aspirin, or anti-inflammatory medications such as ibuprofen or naproxen sodium. Topical treatments such as oral throat lozenges or sprays may be used as needed. Strep infections are not as easily transmitted as other respiratory infections, however we still recommend that you avoid close contact with loved ones, especially the very young and elderly.  Remember to wash your hands thoroughly throughout the day as this is the number one way to prevent the spread of infection and wipe down door knobs and counters with disinfectant.   Home Care: Only take medications as instructed by your medical team. Complete the entire course of an antibiotic. Do not take these medications with alcohol. A steam or ultrasonic humidifier can help congestion.  You can place a towel over your head and breathe in the steam from hot water coming from a faucet. Avoid close contacts especially the very young and the elderly. Cover your mouth when you cough or sneeze. Always remember to wash your hands.  Get Help Right Away If: You develop worsening fever or sinus pain. You develop a severe head ache or visual changes. Your symptoms persist after you have completed your treatment plan.  Make sure you Understand these instructions. Will watch your condition. Will get help right away if you are not doing well or get worse.   Thank you for choosing an e-visit.  Your e-visit  answers were reviewed by a board certified advanced clinical practitioner to complete your personal care plan. Depending upon the condition, your plan could have included both over the counter or prescription medications.  Please review your pharmacy choice. Make sure the pharmacy is open so you can pick up prescription now. If there is a problem, you may contact your provider through [employer] and have the prescription routed to another pharmacy.  Your safety is important to [place]. If you have drug allergies check your prescription carefully.   For the next 24 hours you can use MyChart to ask questions about today's visit, request a non-urgent call back, or ask for a work or school excuse. You will get an email in the next two days asking about your experience. I hope that your e-visit has been valuable and will speed your recovery.

## 2022-05-13 ENCOUNTER — Other Ambulatory Visit (HOSPITAL_COMMUNITY): Payer: Self-pay | Admitting: Psychiatry

## 2022-05-13 DIAGNOSIS — F321 Major depressive disorder, single episode, moderate: Secondary | ICD-10-CM

## 2022-05-15 ENCOUNTER — Other Ambulatory Visit: Payer: Self-pay | Admitting: Emergency Medicine

## 2022-05-15 DIAGNOSIS — N5201 Erectile dysfunction due to arterial insufficiency: Secondary | ICD-10-CM

## 2022-06-03 ENCOUNTER — Other Ambulatory Visit (HOSPITAL_COMMUNITY): Payer: Self-pay | Admitting: Psychiatry

## 2022-06-03 DIAGNOSIS — F321 Major depressive disorder, single episode, moderate: Secondary | ICD-10-CM

## 2022-06-21 ENCOUNTER — Ambulatory Visit (HOSPITAL_BASED_OUTPATIENT_CLINIC_OR_DEPARTMENT_OTHER): Payer: BC Managed Care – PPO | Admitting: Psychiatry

## 2022-06-21 ENCOUNTER — Encounter (HOSPITAL_COMMUNITY): Payer: Self-pay | Admitting: Psychiatry

## 2022-06-21 VITALS — BP 121/80 | HR 112 | Temp 97.5°F | Ht 74.0 in | Wt 306.4 lb

## 2022-06-21 DIAGNOSIS — F321 Major depressive disorder, single episode, moderate: Secondary | ICD-10-CM | POA: Diagnosis not present

## 2022-06-21 MED ORDER — VENLAFAXINE HCL ER 150 MG PO CP24: 150.0000 mg | ORAL_CAPSULE | Freq: Every day | ORAL | 2 refills | Status: DC

## 2022-06-21 NOTE — Progress Notes (Signed)
[place] MD/PA/NP OP Progress Note  06/21/2022 4:28 PM [name]  MRN:  [id number]  Visit Diagnosis:    ICD-10-CM   1. Major depressive disorder, single episode, moderate (HCC)  F32.1 venlafaxine XR (EFFEXOR-XR) 150 MG 24 hr capsule      Assessment: [name] is a 42 y.o. male with a history of PTSD and dysthymia, as well as a medical history of T2DM with resulting ulcer, hypertension, and 3 episodes of "transient alterations of awareness", who presented to [facility] for initial evaluation of depression on 02/19/22.  At time of initial evaluation patient reported neurovegetative symptoms of depression including low mood, anhedonia, fatigue, sleep disturbances, negative thought patterns, and anxiety. He also endorsed mood lability however he denied any signs of mania. Patient denied any hx of AVH, paranoia, delusions, SI/HI or thoughts of self harm. Patient does report having access to firearms, which he reports are stored safely. Patient meets criteria for major depressive disorder.    [name] presents for follow-up evaluation. Today, 06/21/22, patient reports an improvement in his anxiety symptoms however client and his depression.  He describes it as just feeling low and out of it.  He continues to go about his day with work and caring for his kids without issue.  Discussed continuing to titrate the Effexor to 150 mg daily and starting to engage in some behavioral activation/time for himself daily.  Patient was also recommended to restart therapy and was provided with a referral.  Plan: - Increase Effexor XR 150 mg QD  - Continue Trileptal 150 mg BID for seizure disorder managed by outpatient neurologist - Continue Flexeril 10 mg TID prn for muscle spasms managed by PCP - Continue Gabapentin 300 mg TID managed by his neurosurgeon for back pain - Discontinued Wellbutrin due to lack of efficacy and lowering of the seizure threshold - CMP, CBC, A1c  reviewed - Therapy referral - Crisis resources discussed - Follow up in a month  Chief Complaint:  Chief Complaint  Patient presents with   Follow-up   HPI: [name] reports that the past couple months have been up and down.  His anxiety has steadily improved however his depression seems to have gotten worse.  Patient is unsure what the reason behind this is.  He feels like things have been going well in his life other than being evicted in December and having to move to a smaller apartment.  However things are starting to settle there as well now.  He also notes that his boys mother has been less involved in their lives lately which has been frustrating for him to see.  He feels that she needs to get back on medication which she has mentioned at times.  As for his own treatment [name] denies any adverse side effects from the Effexor and we will continue to titrate the medication.  Risk and benefits were discussed.  He also notes that he has not been continue with therapy due to getting insurance but is open to restarting it.  Past Psychiatric History: Denies any prior suicide attempts or psychiatric hospitalizations.  Patient reports his ex wife filed an IVC once on him.  However he was immediately cleared by a psychiatrist when evaluated and his wife had filed a IVC the night before a court hearing for custody of the kids.  Past medication trials: Numerous antidepressant medications including Zoloft, Lexapro, Cymbalta, Wellbutrin and Prozac Currently on Effexor and Trileptal  He does use smokeless tobacco, 1  can/day.  He does not use illegal drugs. The patient denies drinking alcohol.  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    PTSD (post-traumatic stress disorder)    Scoliosis    Sleep apnea     Past Surgical History:  Procedure Laterality Date   LUMBAR LAMINECTOMY/DECOMPRESSION MICRODISCECTOMY Left 05/05/2016   Procedure: CENTRAL DECOMPRESSION L4-L5 AND FORAMINOTOMY FOR L4 ROOT AND L5 ROOT ON THE LEFT;  Surgeon: Latanya Maudlin, MD;  Location: [facility];  Service: Orthopedics;  Laterality: Left;   NO PAST SURGERIES      Family Psychiatric History: Denies  Family History:  Family History  Problem Relation Age of Onset   Stroke Maternal Grandmother    Colon cancer Neg Hx    Esophageal cancer Neg Hx    Rectal cancer Neg Hx    Stomach cancer Neg Hx     Social History:  Social History   Socioeconomic History   Marital status: Divorced    Spouse name: Not on file   Number of children: Not on file   Years of education: Not on file   Highest education level: Not on file  Occupational History   Not on file  Tobacco Use   Smoking status: Former    Types: Cigars   Smokeless tobacco: Current    Types: Snuff  Vaping Use   Vaping Use: Never used  Substance and Sexual Activity   Alcohol use: Yes    Alcohol/week: 5.0 standard drinks of alcohol    Types: 5 Shots of liquor per week    Comment: few times year   Drug use: No   Sexual activity: Not on file  Other Topics Concern   Not on file  Social History Narrative   Not on file   Social Determinants of Health   Financial Resource Strain: Not on file  Food Insecurity: Not on file  Transportation Needs: Not on file  Physical Activity: Not on file  Stress: Not on file  Social Connections: Not on file    Allergies:  Allergies  Allergen Reactions   Amoxicillin Anaphylaxis and Other (See Comments)    Has patient had a PCN reaction causing immediate rash, facial/tongue/throat swelling, SOB or lightheadedness with hypotension: yes Has patient had a PCN reaction causing severe rash involving mucus membranes or skin necrosis: no Has  patient had a PCN reaction that required hospitalization yes Has patient had a PCN reaction occurring within the last 10 years: yes If all of the above answers are "NO", then may proceed with Cephalosporin use.   Penicillins Anaphylaxis and Other (See Comments)    Patient was told to list this as an allergy because he is allergic to Amoxicillin Has patient had a PCN reaction causing immediate rash, facial/tongue/throat swelling, SOB or lightheadedness with hypotension: yes for Amoxicillin Has patient had a PCN reaction causing severe rash involving mucus membranes or skin necrosis: no Has patient had a PCN reaction that required hospitalization yes for Amoxicillin Has patient had a PCN reaction occurring within the last 10 years: yes for Amoxicillin If all o   Metformin And Related Other (See Comments)  SEVERE GI UPSET   Other Other (See Comments)    Powder in some gloves - localized itching but not allergic to latex, benadryl usually helps with this reaction   Latex Itching   Zolpidem Other (See Comments)    Passed out. And memory loss    Current Medications: Current Outpatient Medications  Medication Sig Dispense Refill   acetaminophen (TYLENOL) 500 MG tablet Take 1,000 mg by mouth every 6 (six) hours as needed for mild pain.     amLODipine (NORVASC) 10 MG tablet Take 1 tablet (10 mg total) by mouth daily. 90 tablet 3   blood glucose meter kit and supplies Dispense based on patient and insurance preference. Use up to four times daily as directed. (FOR ICD-10 E10.9, E11.9). 1 each 0   Continuous Blood Gluc Sensor (FREESTYLE LIBRE 3 SENSOR) MISC 1 application by Does not apply route 2 (two) times daily. Place 1 sensor on the skin every 14 days. Use to check glucose continuously 2 each 5   cyclobenzaprine (FLEXERIL) 10 MG tablet TAKE 1 TABLET BY MOUTH THREE TIMES A DAY AS NEEDED FOR MUSCLE SPASMS 90 tablet 1   dapagliflozin propanediol (FARXIGA) 10 MG TABS tablet Take 1 tablet (10 mg  total) by mouth daily before breakfast. 90 tablet 3   Dulaglutide (TRULICITY) 4.5 0000000 SOPN Inject 4.5 mg as directed once a week. 6 mL 3   fluticasone (FLONASE) 50 MCG/ACT nasal spray Place 2 sprays into both nostrils daily. 16 g 0   gabapentin (NEURONTIN) 300 MG capsule Take 1 capsule (300 mg total) by mouth 3 (three) times daily. 90 capsule 0   glipiZIDE (GLUCOTROL) 10 MG tablet Take 1 tablet (10 mg total) by mouth 2 (two) times daily before a meal. 180 tablet 3   loratadine (CLARITIN) 10 MG tablet Take 10 mg by mouth as needed for allergies.     losartan-hydrochlorothiazide (HYZAAR) 100-12.5 MG tablet Take 1 tablet by mouth daily. 90 tablet 3   omeprazole (PRILOSEC) 40 MG capsule Take 1 capsule (40 mg total) by mouth 2 (two) times daily before a meal. 180 capsule 3   OXcarbazepine (TRILEPTAL) 150 MG tablet Take 1 tablet (150 mg total) by mouth 2 (two) times daily. 60 tablet 6   rosuvastatin (CRESTOR) 20 MG tablet Take 1 tablet (20 mg total) by mouth daily. 90 tablet 3   sildenafil (VIAGRA) 100 MG tablet TAKE 0.5-1 TABLETS BY MOUTH DAILY AS NEEDED FOR ERECTILE DYSFUNCTION. 5 tablet 11   tadalafil (CIALIS) 20 MG tablet Take 0.5-1 tablets (10-20 mg total) by mouth every other day as needed for erectile dysfunction. 10 tablet 11   venlafaxine XR (EFFEXOR-XR) 150 MG 24 hr capsule Take 1 capsule (150 mg total) by mouth daily. 30 capsule 2   No current facility-administered medications for this visit.     Psychiatric Specialty Exam: Review of Systems  Blood pressure 121/80, pulse (!) 112, temperature (!) 97.5 F (36.4 C), temperature source Skin, height 6' 2"$  (1.88 m), weight (!) 306 lb 6.4 oz (139 kg).Body mass index is 39.34 kg/m.  General Appearance: Well Groomed  Eye Contact:  Good  Speech:  Clear and Coherent and Normal Rate  Volume:  Normal  Mood:  Depressed and Euthymic  Affect:  Congruent  Thought Process:  Coherent, Goal Directed, and Descriptions of Associations:  Circumstantial  Orientation:  Full (Time, Place, and Person)  Thought Content: Logical   Suicidal Thoughts:  No  Homicidal Thoughts:  No  Memory:  Immediate;  Good  Judgement:  Good  Insight:  Good  Psychomotor Activity:  Normal  Concentration:  Concentration: Good  Recall:  Good  Fund of Knowledge: Good  Language: Good  Akathisia:  NA    AIMS (if indicated): not done  Assets:  Communication Skills Desire for [place] Talents/Skills Transportation Vocational/Educational  ADL's:  Intact  Cognition: WNL  Sleep:  Good   Metabolic Disorder Labs: Lab Results  Component Value Date   HGBA1C 10.1 (A) 03/08/2022   MPG 275 01/15/2019   MPG 240 04/28/2016   No results found for: "PROLACTIN" Lab Results  Component Value Date   CHOL 171 04/23/2021   TRIG 156.0 (H) 04/23/2021   HDL 40.20 04/23/2021   CHOLHDL 4 04/23/2021   VLDL 31.2 04/23/2021   LDLCALC 100 (H) 04/23/2021   LDLCALC 77 06/10/2020   Lab Results  Component Value Date   TSH 2.359 07/11/2014   TSH 1.03 03/11/2008    Therapeutic Level Labs: No results found for: "LITHIUM" No results found for: "VALPROATE" No results found for: "CBMZ"   Screenings: GAD-7    Flowsheet Row Office Visit from 02/07/2020 in [facility] [place] Visit from 03/08/2022 in [place] at [place] Visit from 02/18/2022 in [facility] Office Visit from 10/27/2021 in [place] at [place] Visit from 09/30/2021 in [place] at [place] Visit from 09/08/2021 in [place] at [place]  PHQ-2 Total Score 4 0 4 3 2  $ PHQ-9 Total Score 10 -- 20 13 10      $ Flowsheet Row ED from 09/27/2021 in [facility] Emergency Department at [place] No Risk        Collaboration of Care: Collaboration of Care: Medication Management AEB medication prescription  Patient/Guardian was advised Release of Information must be obtained prior to any record release in order to collaborate their care with an outside provider. Patient/Guardian was advised if they have not already done so to contact the registration department to sign all necessary forms in order for [place] to release information regarding their care.   Consent: Patient/Guardian gives verbal consent for treatment and assignment of benefits for services provided during this visit. Patient/Guardian expressed understanding and agreed to proceed.    Vista Mink, MD 06/21/2022, 4:28 PM    Vista Mink, MD

## 2022-07-05 ENCOUNTER — Other Ambulatory Visit: Payer: Self-pay | Admitting: Emergency Medicine

## 2022-07-07 ENCOUNTER — Encounter: Payer: Self-pay | Admitting: Emergency Medicine

## 2022-07-07 NOTE — Telephone Encounter (Signed)
Okay to write note.  Thanks.

## 2022-07-21 ENCOUNTER — Other Ambulatory Visit (HOSPITAL_COMMUNITY): Payer: Self-pay | Admitting: Psychiatry

## 2022-07-21 DIAGNOSIS — F321 Major depressive disorder, single episode, moderate: Secondary | ICD-10-CM

## 2022-07-24 ENCOUNTER — Other Ambulatory Visit (HOSPITAL_COMMUNITY): Payer: Self-pay | Admitting: Psychiatry

## 2022-07-24 DIAGNOSIS — F321 Major depressive disorder, single episode, moderate: Secondary | ICD-10-CM

## 2022-08-04 ENCOUNTER — Ambulatory Visit (HOSPITAL_BASED_OUTPATIENT_CLINIC_OR_DEPARTMENT_OTHER): Payer: BC Managed Care – PPO | Admitting: Psychiatry

## 2022-08-04 ENCOUNTER — Encounter (HOSPITAL_COMMUNITY): Payer: Self-pay | Admitting: Psychiatry

## 2022-08-04 VITALS — BP 135/87 | HR 78 | Ht 75.0 in | Wt 308.0 lb

## 2022-08-04 DIAGNOSIS — F321 Major depressive disorder, single episode, moderate: Secondary | ICD-10-CM

## 2022-08-04 MED ORDER — VENLAFAXINE HCL ER 150 MG PO CP24: 150.0000 mg | ORAL_CAPSULE | Freq: Every day | ORAL | 2 refills | Status: DC

## 2022-08-04 MED ORDER — VENLAFAXINE HCL ER 37.5 MG PO CP24: 37.5000 mg | ORAL_CAPSULE | Freq: Every day | ORAL | 2 refills | Status: DC

## 2022-08-04 MED ORDER — MIRTAZAPINE 7.5 MG PO TABS: 7.5000 mg | ORAL_TABLET | Freq: Every day | ORAL | 2 refills | Status: DC

## 2022-08-04 NOTE — Progress Notes (Signed)
BH MD/PA/NP OP Progress Note  08/04/2022 3:42 PM [name]  MRN:  [id number]  Visit Diagnosis:    ICD-10-CM   1. Major depressive disorder, single episode, moderate (HCC)  F32.1 venlafaxine XR (EFFEXOR-XR) 150 MG 24 hr capsule    venlafaxine XR (EFFEXOR XR) 37.5 MG 24 hr capsule    mirtazapine (REMERON) 7.5 MG tablet      Assessment: [name] is a 42 y.o. male with a history of PTSD and dysthymia, as well as a medical history of T2DM with resulting ulcer, hypertension, and 3 episodes of "transient alterations of awareness", who presented to [facility] for initial evaluation of depression on 02/19/22.  At time of initial evaluation patient reported neurovegetative symptoms of depression including low mood, anhedonia, fatigue, sleep disturbances, negative thought patterns, and anxiety. He also endorsed mood lability however he denied any signs of mania. Patient denied any hx of AVH, paranoia, delusions, SI/HI or thoughts of self harm. Patient does report having access to firearms, which he reports are stored safely. Patient meets criteria for major depressive disorder.    [name] presents for follow-up evaluation. Today, 08/04/22, patient reports that her anxiety remains improved however her depression is unchanged.  Day-to-day work has been going fine and he denies any stressors.  However when not at work he describes fatigue, anhedonia, and amotivation not engaging in his usual activities.  He denies any SI or thoughts of self-harm.  We will increase Effexor to 187.5 mg daily and start Remeron 7.5 mg at bedtime.  Risk and benefits of both medications were discussed.  Patient was also provided with therapy information and encouraged to reach out to set up an appointment.  Plan: - Increase Effexor XR 187.5 mg QD - Start Remeron 7.5 mg QHS  - Continue Trileptal 150 mg BID for seizure disorder managed by outpatient neurologist - Continue Flexeril 10 mg  TID prn for muscle spasms managed by PCP - Continue Gabapentin 300 mg TID managed by his neurosurgeon for back pain - Discontinued Wellbutrin due to lack of efficacy and lowering of the seizure threshold - CMP, CBC, A1c reviewed - Therapy referral - Crisis resources discussed - Follow up in a month  Chief Complaint:  Chief Complaint  Patient presents with   Follow-up   HPI: [name] presents reporting that he is ok, but is still very depressed with no idea why.  He notes that everything in his life seems to be going well so he is not sure of the reason for depression but does note that it is affecting him.  He describes experiencing symptoms of anhedonia, decreased motivation, and fatigue.  He is still going to work but outside of that he has been much less engaged.  Patient notes that he missed church which he typically enjoys.  He also did not go do things with his kids this weekend which is very uncommon for him.  Instead he spent most of the weekend in bed even though he is not really sleeping.  He denies any SI or thoughts of self-harm.  As for his anxiety patient reports that he still has improved and now the depressions his main issue.  We reviewed treatment options including continuing to titrate the Effexor and adding an additional medication to help with mood and sleep symptoms.  Patient was open to this and risk and benefits of Remeron were discussed.  We also encouraged patient to connect with a therapist and provided him with therapy  referral info.  Past Psychiatric History: Denies any prior suicide attempts or psychiatric hospitalizations.  Patient reports his ex wife filed an IVC once on him.  However he was immediately cleared by a psychiatrist when evaluated and his wife had filed a IVC the night before a court hearing for custody of the kids.  Past medication trials: Numerous antidepressant medications including Zoloft, Lexapro, Cymbalta, Wellbutrin and Prozac Currently on  Effexor and Trileptal  He does use smokeless tobacco, 1 can/day.  He does not use illegal drugs. The patient denies drinking alcohol.  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    PTSD (post-traumatic stress disorder)    Scoliosis    Sleep apnea     Past Surgical History:  Procedure Laterality Date   LUMBAR LAMINECTOMY/DECOMPRESSION MICRODISCECTOMY Left 05/05/2016   Procedure: CENTRAL DECOMPRESSION L4-L5 AND FORAMINOTOMY FOR L4 ROOT AND L5 ROOT ON THE LEFT;  Surgeon: Latanya Maudlin, MD;  Location: [facility];  Service: Orthopedics;  Laterality: Left;   NO PAST SURGERIES      Family Psychiatric History: Denies  Family History:  Family History  Problem Relation Age of Onset   Stroke Maternal Grandmother    Colon cancer Neg Hx    Esophageal cancer Neg Hx    Rectal cancer Neg Hx    Stomach cancer Neg Hx     Social History:  Social History   Socioeconomic History   Marital status: Divorced    Spouse name: Not on file   Number of children: Not on file   Years of education: Not on file   Highest education level: Not on file  Occupational History   Not on file  Tobacco Use   Smoking status: Former    Types: Cigars   Smokeless tobacco: Current    Types: Snuff  Vaping Use   Vaping Use: Never used  Substance and Sexual Activity   Alcohol use: Yes    Alcohol/week: 5.0 standard drinks of alcohol    Types: 5 Shots of liquor per week    Comment: few times year   Drug use: No   Sexual activity: Not on file  Other Topics Concern   Not on file  Social History Narrative   Not on file   Social Determinants of Health   Financial Resource Strain: Not on file  Food Insecurity: Not on file  Transportation  Needs: Not on file  Physical Activity: Not on file  Stress: Not on file  Social Connections: Not on file    Allergies:  Allergies  Allergen Reactions   Amoxicillin Anaphylaxis and Other (See Comments)    Has patient had a PCN reaction causing immediate rash, facial/tongue/throat swelling, SOB or lightheadedness with hypotension: yes Has patient had a PCN reaction causing severe rash involving mucus membranes or skin necrosis: no Has patient had a PCN reaction that required hospitalization yes Has patient had a PCN reaction occurring within the last 10 years: yes If all of the above answers are "NO", then may proceed with Cephalosporin use.   Penicillins Anaphylaxis and Other (See Comments)    Patient was told to list this as an allergy because  he is allergic to Amoxicillin Has patient had a PCN reaction causing immediate rash, facial/tongue/throat swelling, SOB or lightheadedness with hypotension: yes for Amoxicillin Has patient had a PCN reaction causing severe rash involving mucus membranes or skin necrosis: no Has patient had a PCN reaction that required hospitalization yes for Amoxicillin Has patient had a PCN reaction occurring within the last 10 years: yes for Amoxicillin If all o   Metformin And Related Other (See Comments)    SEVERE GI UPSET   Other Other (See Comments)    Powder in some gloves - localized itching but not allergic to latex, benadryl usually helps with this reaction   Latex Itching   Zolpidem Other (See Comments)    Passed out. And memory loss    Current Medications: Current Outpatient Medications  Medication Sig Dispense Refill   acetaminophen (TYLENOL) 500 MG tablet Take 1,000 mg by mouth every 6 (six) hours as needed for mild pain.     amLODipine (NORVASC) 10 MG tablet Take 1 tablet (10 mg total) by mouth daily. 90 tablet 3   blood glucose meter kit and supplies Dispense based on patient and insurance preference. Use up to four times daily as directed.  (FOR ICD-10 E10.9, E11.9). 1 each 0   Continuous Blood Gluc Sensor (FREESTYLE LIBRE 3 SENSOR) MISC 1 application by Does not apply route 2 (two) times daily. Place 1 sensor on the skin every 14 days. Use to check glucose continuously 2 each 5   cyclobenzaprine (FLEXERIL) 10 MG tablet TAKE 1 TABLET BY MOUTH THREE TIMES A DAY AS NEEDED FOR MUSCLE SPASM 90 tablet 1   dapagliflozin propanediol (FARXIGA) 10 MG TABS tablet Take 1 tablet (10 mg total) by mouth daily before breakfast. 90 tablet 3   Dulaglutide (TRULICITY) 4.5 0000000 SOPN Inject 4.5 mg as directed once a week. 6 mL 3   fluticasone (FLONASE) 50 MCG/ACT nasal spray Place 2 sprays into both nostrils daily. 16 g 0   gabapentin (NEURONTIN) 300 MG capsule Take 1 capsule (300 mg total) by mouth 3 (three) times daily. 90 capsule 0   glipiZIDE (GLUCOTROL) 10 MG tablet Take 1 tablet (10 mg total) by mouth 2 (two) times daily before a meal. 180 tablet 3   loratadine (CLARITIN) 10 MG tablet Take 10 mg by mouth as needed for allergies.     losartan-hydrochlorothiazide (HYZAAR) 100-12.5 MG tablet Take 1 tablet by mouth daily. 90 tablet 3   mirtazapine (REMERON) 7.5 MG tablet Take 1 tablet (7.5 mg total) by mouth at bedtime. 30 tablet 2   omeprazole (PRILOSEC) 40 MG capsule Take 1 capsule (40 mg total) by mouth 2 (two) times daily before a meal. 180 capsule 3   OXcarbazepine (TRILEPTAL) 150 MG tablet Take 1 tablet (150 mg total) by mouth 2 (two) times daily. 60 tablet 6   rosuvastatin (CRESTOR) 20 MG tablet Take 1 tablet (20 mg total) by mouth daily. 90 tablet 3   sildenafil (VIAGRA) 100 MG tablet TAKE 0.5-1 TABLETS BY MOUTH DAILY AS NEEDED FOR ERECTILE DYSFUNCTION. 5 tablet 11   tadalafil (CIALIS) 20 MG tablet Take 0.5-1 tablets (10-20 mg total) by mouth every other day as needed for erectile dysfunction. 10 tablet 11   venlafaxine XR (EFFEXOR XR) 37.5 MG 24 hr capsule Take 1 capsule (37.5 mg total) by mouth daily. Take with 150 mg dose for a total of  187.5 mg 30 capsule 2   venlafaxine XR (EFFEXOR-XR) 150 MG 24 hr capsule Take 1 capsule (150  mg total) by mouth daily. Take with 37.5 mg dose for a total of 187.5 mg daily 30 capsule 2   No current facility-administered medications for this visit.     Psychiatric Specialty Exam: Review of Systems  Blood pressure 135/87, pulse 78, height 6\' 3"  (1.905 m), weight (!) 308 lb (139.7 kg).Body mass index is 38.5 kg/m.  General Appearance: Well Groomed  Eye Contact:  Good  Speech:  Clear and Coherent and Normal Rate  Volume:  Normal  Mood:  Depressed and Euthymic  Affect:  Congruent  Thought Process:  Coherent, Goal Directed, and Descriptions of Associations: Circumstantial  Orientation:  Full (Time, Place, and Person)  Thought Content: Logical   Suicidal Thoughts:  No  Homicidal Thoughts:  No  Memory:  Immediate;   Good  Judgement:  Good  Insight:  Good  Psychomotor Activity:  Normal  Concentration:  Concentration: Good  Recall:  Good  Fund of Knowledge: Good  Language: Good  Akathisia:  NA    AIMS (if indicated): not done  Assets:  Communication Skills Desire for [place] Talents/Skills Transportation Vocational/Educational  ADL's:  Intact  Cognition: WNL  Sleep:  Good   Metabolic Disorder Labs: Lab Results  Component Value Date   HGBA1C 10.1 (A) 03/08/2022   MPG 275 01/15/2019   MPG 240 04/28/2016   No results found for: "PROLACTIN" Lab Results  Component Value Date   CHOL 171 04/23/2021   TRIG 156.0 (H) 04/23/2021   HDL 40.20 04/23/2021   CHOLHDL 4 04/23/2021   VLDL 31.2 04/23/2021   LDLCALC 100 (H) 04/23/2021   LDLCALC 77 06/10/2020   Lab Results  Component Value Date   TSH 2.359 07/11/2014   TSH 1.03 03/11/2008    Therapeutic Level Labs: No results found for: "LITHIUM" No results found for: "VALPROATE" No results found for: "CBMZ"   Screenings: GAD-7    Flowsheet Row Office Visit from 02/07/2020 in [place] at [place] Visit from 03/08/2022 in [place] at [place] Visit from 02/18/2022 in [facility] Office Visit from 10/27/2021 in [place] [facility] [place] Visit from 09/30/2021 in [place] at [place] Visit from 09/08/2021 in [place] at [place]  PHQ-2 Total Score 4 0 4 3 2   PHQ-9 Total Score 10 -- 20 13 10       [place] ED from 09/27/2021 in [facility] at [place] No Risk       Collaboration of Care: Collaboration of Care: Medication Management AEB medication prescription  Patient/Guardian was advised Release of Information must be obtained prior to any record release in order to collaborate their care with an outside provider. Patient/Guardian was advised if they have not already done so to contact the registration department to sign all necessary forms in order for [place] to release information regarding their care.   Consent: Patient/Guardian gives verbal consent for treatment and assignment of benefits for services provided during this visit. Patient/Guardian expressed understanding and agreed to proceed.    Vista Mink, MD 08/04/2022, 3:42 PM    Vista Mink, MD

## 2022-08-10 ENCOUNTER — Ambulatory Visit: Payer: BC Managed Care – PPO | Admitting: Neurology

## 2022-08-10 ENCOUNTER — Encounter: Payer: Self-pay | Admitting: Neurology

## 2022-08-10 VITALS — BP 135/92 | HR 100 | Ht 75.0 in | Wt 308.0 lb

## 2022-08-10 DIAGNOSIS — Z5181 Encounter for therapeutic drug level monitoring: Secondary | ICD-10-CM | POA: Diagnosis not present

## 2022-08-10 DIAGNOSIS — R569 Unspecified convulsions: Secondary | ICD-10-CM | POA: Diagnosis not present

## 2022-08-10 MED ORDER — OXCARBAZEPINE 150 MG PO TABS: 150.0000 mg | ORAL_TABLET | Freq: Two times a day (BID) | ORAL | 3 refills | Status: DC

## 2022-08-10 NOTE — Patient Instructions (Signed)
Continue with oxcarbazepine 150 mg twice daily Will check a level today with BMP Will follow up regarding EMU admission Follow-up in 1 year or sooner if worse.

## 2022-08-10 NOTE — Progress Notes (Signed)
[facility]  PATIENT: [name] DOB: 10-26-1980  REQUESTING CLINICIAN: [place], [place] HISTORY FROM: Patient  REASON FOR VISIT: Transient alteration of awareness   HISTORICAL  CHIEF COMPLAINT:  Chief Complaint  Patient presents with   Follow-up    Rm 12, alone Doing well, no new seizures    INTERVAL HISTORY 08/10/2022:  Patient presents today for follow-up, last visit was in October of last year.  At that time he was started on oxcarbazepine 150 mg twice daily and referred to [facility] for capture and characterization.  Since then he has 1 event at work that was described as behavioral arrest.  He has not heard from the hospital.  Since then he has been doing fine tolerating the oxcarbazepine very well, no additional seizures, no additional events.  He feels that his depression is getting worse and his anxiety is more controlled.  He is seeing psychiatry and also undergoing therapy.  No other concerns.   INTERVAL HISTORY 02/16/22 Patient presents today for follow-up, last visit was in June since that he had a routine EEG which was negative also have ambulatory EEG which was normal.  He continued to have events, reported last event was 3 weeks ago.  He was with his son and the son told him that he stopped talking, staring, and start talking like a baby.  He does not remember the event.  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   HISTORY OF PRESENT ILLNESS:  This is a 42 year old gentleman past medical history of hypertension, hyperlipidemia and diabetes mellitus who is presenting with 3 incidents of transient alteration of awareness.  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  The first incident was on March 26, he remembers that he was eating and dropped his food and next thing that he knows is that his coworker was telling him that he was talking differently, talking to people who were not  there, and he was not making any sense.  He does not remember the incident.  He reported he was terminated from his job on May 23 because of 3 incidents that happened at work.   His letter of termination shows that on the 26 there was a EMS called that was held for 9-minutes while he was working EMS dispatch On April 19, patient received a call where his entry and change resulted in numerous errors including incorrect gender and ultimately unknown problem resulting in low enforcement being dispatched for medical reason.  The note indicated that the call resulted in 7-minute duration time with the majority of the call spent in silence.  And finally on May 20 you receive a call from a pregnant male with sharp pain, you coded this call as a sick 42 year old male.  The caller called back and reported that he sounded out of it. Due to these multiple errors, he was terminated from his job.  He reports since being home, no one has reported any additional incident.  He lives with his 2 sons.  He denies any previous history of seizures and denies any seizure risk factors, no family history of seizures.   OTHER MEDICAL CONDITIONS: DMII, HTN, HLD   REVIEW OF SYSTEMS: Full 14 system review of systems performed and negative with exception of: as noted the HPI   ALLERGIES: Allergies  Allergen Reactions   Amoxicillin Anaphylaxis and Other (See Comments)    Has patient had a  PCN reaction causing immediate rash, facial/tongue/throat swelling, SOB or lightheadedness with hypotension: yes Has patient had a PCN reaction causing severe rash involving mucus membranes or skin necrosis: no Has patient had a PCN reaction that required hospitalization yes Has patient had a PCN reaction occurring within the last 10 years: yes If all of the above answers are "NO", then may proceed with Cephalosporin use.   Penicillins Anaphylaxis and Other (See Comments)    Patient was told to list this as an allergy because he is  allergic to Amoxicillin Has patient had a PCN reaction causing immediate rash, facial/tongue/throat swelling, SOB or lightheadedness with hypotension: yes for Amoxicillin Has patient had a PCN reaction causing severe rash involving mucus membranes or skin necrosis: no Has patient had a PCN reaction that required hospitalization yes for Amoxicillin Has patient had a PCN reaction occurring within the last 10 years: yes for Amoxicillin If all o   Metformin And Related Other (See Comments)    SEVERE GI UPSET   Other Other (See Comments)    Powder in some gloves - localized itching but not allergic to latex, benadryl usually helps with this reaction   Latex Itching   Zolpidem Other (See Comments)    Passed out. And memory loss    HOME MEDICATIONS: Outpatient Medications Prior to Visit  Medication Sig Dispense Refill   acetaminophen (TYLENOL) 500 MG tablet Take 1,000 mg by mouth every 6 (six) hours as needed for mild pain.     amLODipine (NORVASC) 10 MG tablet Take 1 tablet (10 mg total) by mouth daily. 90 tablet 3   blood glucose meter kit and supplies Dispense based on patient and insurance preference. Use up to four times daily as directed. (FOR ICD-10 E10.9, E11.9). 1 each 0   Continuous Blood Gluc Sensor (FREESTYLE LIBRE 3 SENSOR) MISC 1 application by Does not apply route 2 (two) times daily. Place 1 sensor on the skin every 14 days. Use to check glucose continuously 2 each 5   cyclobenzaprine (FLEXERIL) 10 MG tablet TAKE 1 TABLET BY MOUTH THREE TIMES A DAY AS NEEDED FOR MUSCLE SPASM 90 tablet 1   dapagliflozin propanediol (FARXIGA) 10 MG TABS tablet Take 1 tablet (10 mg total) by mouth daily before breakfast. 90 tablet 3   Dulaglutide (TRULICITY) 4.5 0000000 SOPN Inject 4.5 mg as directed once a week. 6 mL 3   fluticasone (FLONASE) 50 MCG/ACT nasal spray Place 2 sprays into both nostrils daily. 16 g 0   gabapentin (NEURONTIN) 300 MG capsule Take 1 capsule (300 mg total) by mouth 3 (three)  times daily. 90 capsule 0   glipiZIDE (GLUCOTROL) 10 MG tablet Take 1 tablet (10 mg total) by mouth 2 (two) times daily before a meal. 180 tablet 3   loratadine (CLARITIN) 10 MG tablet Take 10 mg by mouth as needed for allergies.     losartan-hydrochlorothiazide (HYZAAR) 100-12.5 MG tablet Take 1 tablet by mouth daily. 90 tablet 3   mirtazapine (REMERON) 7.5 MG tablet Take 1 tablet (7.5 mg total) by mouth at bedtime. 30 tablet 2   omeprazole (PRILOSEC) 40 MG capsule Take 1 capsule (40 mg total) by mouth 2 (two) times daily before a meal. 180 capsule 3   rosuvastatin (CRESTOR) 20 MG tablet Take 1 tablet (20 mg total) by mouth daily. 90 tablet 3   sildenafil (VIAGRA) 100 MG tablet TAKE 0.5-1 TABLETS BY MOUTH DAILY AS NEEDED FOR ERECTILE DYSFUNCTION. 5 tablet 11   tadalafil (CIALIS) 20 MG tablet  Take 0.5-1 tablets (10-20 mg total) by mouth every other day as needed for erectile dysfunction. 10 tablet 11   venlafaxine XR (EFFEXOR XR) 37.5 MG 24 hr capsule Take 1 capsule (37.5 mg total) by mouth daily. Take with 150 mg dose for a total of 187.5 mg 30 capsule 2   venlafaxine XR (EFFEXOR-XR) 150 MG 24 hr capsule Take 1 capsule (150 mg total) by mouth daily. Take with 37.5 mg dose for a total of 187.5 mg daily 30 capsule 2   OXcarbazepine (TRILEPTAL) 150 MG tablet Take 1 tablet (150 mg total) by mouth 2 (two) times daily. 60 tablet 6   No facility-administered medications prior to visit.    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    PTSD (post-traumatic stress disorder)    Scoliosis    Sleep apnea     PAST SURGICAL HISTORY: Past Surgical History:  Procedure Laterality Date   LUMBAR LAMINECTOMY/DECOMPRESSION MICRODISCECTOMY Left  05/05/2016   Procedure: CENTRAL DECOMPRESSION L4-L5 AND FORAMINOTOMY FOR L4 ROOT AND L5 ROOT ON THE LEFT;  Surgeon: Latanya Maudlin, MD;  Location: [facility];  Service: Orthopedics;  Laterality: Left;   NO PAST SURGERIES      FAMILY HISTORY: Family History  Problem Relation Age of Onset   Stroke Maternal Grandmother    Colon cancer Neg Hx    Esophageal cancer Neg Hx    Rectal cancer Neg Hx    Stomach cancer Neg Hx     SOCIAL HISTORY: Social History   Socioeconomic History   Marital status: Divorced    Spouse name: Not on file   Number of children: Not on file   Years of education: Not on file   Highest education level: Not on file  Occupational History   Not on file  Tobacco Use   Smoking status: Former    Types: Cigars   Smokeless tobacco: Current    Types: Snuff  Vaping Use   Vaping Use: Never used  Substance and Sexual Activity   Alcohol use: Yes    Alcohol/week: 5.0 standard drinks of alcohol    Types: 5 Shots of liquor per week    Comment: few times year   Drug use: No   Sexual activity: Not on file  Other Topics Concern   Not on file  Social History Narrative   Not on file   Social Determinants of Health   Financial Resource Strain: Not on file  Food Insecurity: Not on file  Transportation Needs: Not on file  Physical Activity: Not on file  Stress: Not on file  Social Connections: Not on file  Intimate Partner Violence: Not on file    PHYSICAL EXAM  GENERAL EXAM/CONSTITUTIONAL: Vitals:  Vitals:   08/10/22 1444  BP: (!) 135/92  Pulse: 100  Weight: (!) 308 lb (139.7 kg)  Height: 6\' 3"  (1.905 m)     Body mass index is 38.5 kg/m. Wt Readings from Last 3 Encounters:  08/10/22 (!) 308 lb (139.7 kg)  03/08/22 297 lb 2 oz (134.8 kg)  02/16/22 300 lb (136.1 kg)   Patient is in no distress; well developed, nourished and groomed; neck is  supple  EYES: Visual fields full to confrontation, Extraocular movements intacts,   MUSCULOSKELETAL: Gait,  strength, tone, movements noted in Neurologic exam below  NEUROLOGIC: MENTAL STATUS:      No data to display         awake, alert, oriented to person, place and time recent and remote memory intact normal attention and concentration language fluent, comprehension intact, naming intact fund of knowledge appropriate  CRANIAL NERVE:  2nd, 3rd, 4th, 6th - visual fields full to confrontation, extraocular muscles intact, no nystagmus 5th - facial sensation symmetric 7th - facial strength symmetric 8th - hearing intact 9th - palate elevates symmetrically, uvula midline 11th - shoulder shrug symmetric 12th - tongue protrusion midline  MOTOR:  normal bulk and tone, full strength in the BUE, BLE  SENSORY:  normal and symmetric to light touch  COORDINATION:  finger-nose-finger, fine finger movements normal  REFLEXES:  deep tendon reflexes present and symmetric  GAIT/STATION:  normal    DIAGNOSTIC DATA (LABS, IMAGING, TESTING) - I reviewed patient records, labs, notes, testing and imaging myself where available.  Lab Results  Component Value Date   WBC 11.2 (H) 09/27/2021   HGB 12.2 (L) 09/27/2021   HCT 37.1 (L) 09/27/2021   MCV 83.4 09/27/2021   PLT 249 09/27/2021      Component Value Date/Time   NA 139 09/27/2021 0041   NA 137 06/10/2020 0957   K 3.8 09/27/2021 0041   CL 105 09/27/2021 0041   CO2 27 09/27/2021 0041   GLUCOSE 329 (H) 09/27/2021 0041   BUN 17 09/27/2021 0041   BUN 11 06/10/2020 0957   CREATININE 1.15 09/27/2021 0041   CREATININE 0.76 12/26/2014 1055   CALCIUM 8.7 (L) 09/27/2021 0041   PROT 7.3 09/27/2021 0041   PROT 7.4 06/10/2020 0957   ALBUMIN 3.7 09/27/2021 0041   ALBUMIN 4.4 06/10/2020 0957   AST 20 09/27/2021 0041   ALT 28 09/27/2021 0041   ALKPHOS 56 09/27/2021 0041   BILITOT 0.5 09/27/2021 0041   BILITOT 0.4 06/10/2020 0957   GFRNONAA >60 09/27/2021 0041   GFRNONAA >89 12/26/2014 1055   GFRAA 121 06/10/2020 0957   GFRAA >89  12/26/2014 1055   Lab Results  Component Value Date   CHOL 171 04/23/2021   HDL 40.20 04/23/2021   LDLCALC 100 (H) 04/23/2021   LDLDIRECT 76.9 01/16/2019   TRIG 156.0 (H) 04/23/2021   CHOLHDL 4 04/23/2021   Lab Results  Component Value Date   HGBA1C 10.1 (A) 03/08/2022   Lab Results  Component Value Date   VITAMINB12 354 03/26/2008   Lab Results  Component Value Date   TSH 2.359 07/11/2014    CT Head 09/27/21:  Unremarkable noncontrast CT of the brain  Routine EEG 11/04/21 Normal   Ambulatory EEG 12/09/21 This is a normal 48 hours ambulatory video EEG. There are 2 reports of headaches, no time stamp provided but complete review of file did not show any seizures or abnormalities.   ASSESSMENT AND PLAN  42 y.o. year old male with history of hypertension, hyperlipidemia, diabetes mellitus who is presenting for follow up of 4 episodes of altered awareness.  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 He is on Oxcarbazepine 150 mg BID and since then has been doing well. His EMU admission was not completed, I will follow up. I will check a level today with BMP and I will see  him in a year or sooner if worse.     1. Seizure-like activity   2. Therapeutic drug monitoring       Patient Instructions  Continue with oxcarbazepine 150 mg twice daily Will check a level today with BMP Will follow up regarding EMU admission Follow-up in 1 year or sooner if worse.   Orders Placed This Encounter  Procedures   Basic Metabolic Panel   [id number]    Meds ordered this encounter  Medications   OXcarbazepine (TRILEPTAL) 150 MG tablet    Sig: Take 1 tablet (150 mg total) by mouth 2 (two) times daily.    Dispense:  180 tablet    Refill:  3    Return in about 1 year (around 08/10/2023).    Alric Ran, MD 08/10/2022, 3:14 PM  [facility] [address] [place], [place] [address] [phone]

## 2022-08-13 LAB — BASIC METABOLIC PANEL
BUN/Creatinine Ratio: 14 (ref 9–20)
BUN: 12 mg/dL (ref 6–20)
CO2: 27 mmol/L (ref 20–29)
Calcium: 10 mg/dL (ref 8.7–10.2)
Chloride: 98 mmol/L (ref 96–106)
Creatinine, Ser: 0.87 mg/dL (ref 0.76–1.27)
Glucose: 250 mg/dL — ABNORMAL HIGH (ref 70–99)
Potassium: 4.1 mmol/L (ref 3.5–5.2)
Sodium: 140 mmol/L (ref 134–144)
eGFR: 113 mL/min/{1.73_m2} (ref >59)

## 2022-08-13 LAB — 10-HYDROXYCARBAZEPINE: Oxcarbazepine SerPl-Mcnc: <1 ug/mL — ABNORMAL LOW (ref 10–35)

## 2022-08-24 ENCOUNTER — Ambulatory Visit: Payer: Self-pay | Admitting: Neurology

## 2022-08-28 ENCOUNTER — Other Ambulatory Visit (HOSPITAL_COMMUNITY): Payer: Self-pay | Admitting: Psychiatry

## 2022-08-28 DIAGNOSIS — F321 Major depressive disorder, single episode, moderate: Secondary | ICD-10-CM

## 2022-09-06 ENCOUNTER — Ambulatory Visit (HOSPITAL_BASED_OUTPATIENT_CLINIC_OR_DEPARTMENT_OTHER): Payer: BC Managed Care – PPO | Admitting: Psychiatry

## 2022-09-06 ENCOUNTER — Other Ambulatory Visit (HOSPITAL_COMMUNITY): Payer: Self-pay | Admitting: Psychiatry

## 2022-09-06 ENCOUNTER — Encounter (HOSPITAL_COMMUNITY): Payer: Self-pay | Admitting: Psychiatry

## 2022-09-06 VITALS — BP 124/83 | HR 120 | Ht 75.0 in | Wt 313.0 lb

## 2022-09-06 DIAGNOSIS — F901 Attention-deficit hyperactivity disorder, predominantly hyperactive type: Secondary | ICD-10-CM | POA: Diagnosis not present

## 2022-09-06 DIAGNOSIS — F321 Major depressive disorder, single episode, moderate: Secondary | ICD-10-CM

## 2022-09-06 DIAGNOSIS — F909 Attention-deficit hyperactivity disorder, unspecified type: Secondary | ICD-10-CM | POA: Insufficient documentation

## 2022-09-06 MED ORDER — VENLAFAXINE HCL ER 150 MG PO CP24: 150.0000 mg | ORAL_CAPSULE | Freq: Every day | ORAL | 2 refills | Status: DC

## 2022-09-06 MED ORDER — ATOMOXETINE HCL 18 MG PO CAPS: 18.0000 mg | ORAL_CAPSULE | Freq: Every day | ORAL | 2 refills | Status: DC

## 2022-09-06 MED ORDER — HYDROXYZINE HCL 10 MG PO TABS: 10.0000 mg | ORAL_TABLET | Freq: Every evening | ORAL | 1 refills | Status: DC | PRN

## 2022-09-06 NOTE — Progress Notes (Signed)
BH MD/PA/NP OP Progress Note  09/06/2022 4:13 PM [name]  MRN:  [id number]  Visit Diagnosis:    ICD-10-CM   1. Major depressive disorder, single episode, moderate (HCC)  F32.1       Assessment: [name] is a 42 y.o. male with a history of PTSD and dysthymia, as well as a medical history of T2DM with resulting ulcer, hypertension, and 3 episodes of "transient alterations of awareness", who presented to [facility] for initial evaluation of depression on 02/19/22.  At time of initial evaluation patient reported neurovegetative symptoms of depression including low mood, anhedonia, fatigue, sleep disturbances, negative thought patterns, and anxiety. He also endorsed mood lability however he denied any signs of mania. Patient denied any hx of AVH, paranoia, delusions, SI/HI or thoughts of self harm. Patient does report having access to firearms, which he reports are stored safely. Patient meets criteria for major depressive disorder.    [name] presents for follow-up evaluation. Today, 09/06/22, patient reports that his mood and anxiety symptoms are more or less unchanged over the past month.  It was noted however that he has been more engaged and less lethargic this past month.  Patient did start to experience increased irritability and headaches in the last month which could potentially be related to the increase in Effexor.  We will decrease the Effexor back to 150 mg daily today.  Sleep remains an issue and we will start patient on Atarax 10 mg at bedtime.  Risk and benefits were discussed.  Patient had also brought up concerns for ADHD.  We will refer for neuropsych testing and start on atomoxetine 18 mg daily today.  Risk and benefits were discussed.  Plan: - decrease Effexor XR 150 mg QD - Continue Remeron 7.5 mg QHS  - Start atomextine 18 mg QD - Start Atarax 10 mg QHS prn for anxiety - Continue Trileptal 150 mg BID for seizure disorder managed by  outpatient neurologist - Continue Flexeril 20 mg BID prn for muscle spasms managed by PCP - Continue Gabapentin 300 mg BID managed by his neurosurgeon for back pain - Discontinued Wellbutrin due to lack of efficacy and lowering of the seizure threshold - Neuropsych testing referral - CMP, CBC, A1c reviewed - Therapy referral - Crisis resources discussed - Follow up in a month  Chief Complaint:  Chief Complaint  Patient presents with   Follow-up   HPI: [name] presents reporting that the past month he has noticed that his anxiety might be a little bit better while the depression still persists.  He also notes that he has been more irritable and experiencing headaches the past few weeks.  While patient does note the depression still persists he has been getting out more both in nature and doing things with his kids compared to last month.  His sleep symptoms remain unchanged with the addition of the mirtazapine with patient only sleeping 4 hours a night.  He reports that he will go to bed and lie in bed for several hours before being able to fall asleep.  Patient links this to his racing thoughts.  We reviewed the recent increase in Effexor and explained that this could potentially be related to the headaches started a few weeks ago.  Based off this we recommended tapering the medication back down to 150 and monitoring for any improvement.  As for the mirtazapine we considered increasing it however patient noted concerns about increased weight gain for the past several  months.  We decided to hold off on increasing mirtazapine at this time and consider alternative medications to help with sleep and anxiety symptoms particularly at night.  Options were discussed and we decided to start Atarax 10 mg.  We will monitor for any oversedation as patient reports experiencing significant sedation secondary to Benadryl in the past.  Patient had also mentioned concern about his inability to focus.  He notes being  diagnosed with ADHD in the past though never had neuropsych testing or tried any medications.  He reports currently struggling with focusing during the day.  Giving examples of being able to hear things but not processing what is being said.  In addition to being easily distracted.  In regards to school when he was younger patient reports struggling to complete assignments on time.  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  We discussed referring the patient to neuropsych testing which she was open to.  We also talked about starting Strattera at a low dose which he was open to trying while awaiting neuropsych testing.  Past Psychiatric History: Denies any prior suicide attempts or psychiatric hospitalizations.  Patient reports his ex wife filed an IVC once on him.  However he was immediately cleared by a psychiatrist when evaluated and his wife had filed a IVC the night before a court hearing for custody of the kids.  Past medication trials: Numerous antidepressant medications including Zoloft, Lexapro, Cymbalta, Wellbutrin, trazodone, Ambien and Prozac  He does use smokeless tobacco, 1 can/day.  He does not use illegal drugs. The patient denies drinking alcohol.   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    PTSD (post-traumatic stress disorder)    Scoliosis    Sleep apnea     Past Surgical History:  Procedure Laterality Date   LUMBAR LAMINECTOMY/DECOMPRESSION MICRODISCECTOMY Left 05/05/2016   Procedure: CENTRAL DECOMPRESSION L4-L5 AND FORAMINOTOMY FOR L4 ROOT AND L5 ROOT ON THE LEFT;   Surgeon: Ranee Gosselin, MD;  Location: [facility];  Service: Orthopedics;  Laterality: Left;   NO PAST SURGERIES      Family Psychiatric History: Denies  Family History:  Family History  Problem Relation Age of Onset   Stroke Maternal Grandmother    Colon cancer Neg Hx    Esophageal cancer Neg Hx    Rectal cancer Neg Hx    Stomach cancer Neg Hx     Social History:  Social History   Socioeconomic History   Marital status: Divorced    Spouse name: Not on file   Number of children: Not on file   Years of education: Not on file   Highest education level: Some college, no degree  Occupational History   Not on file  Tobacco Use   Smoking status: Former    Types: Cigars   Smokeless tobacco: Current    Types: Snuff  Vaping Use   Vaping Use: Never used  Substance and Sexual Activity   Alcohol use: Yes    Alcohol/week: 5.0 standard drinks of  alcohol    Types: 5 Shots of liquor per week    Comment: few times year   Drug use: No   Sexual activity: Not on file  Other Topics Concern   Not on file  Social History Narrative   Not on file   Social Determinants of Health   Financial Resource Strain: Low Risk  (09/02/2022)   Overall Financial Resource Strain (CARDIA)    Difficulty of Paying Living Expenses: Not very hard  Food Insecurity: Food Insecurity Present (09/02/2022)   Hunger Vital Sign    Worried About Running Out of Food in the Last Year: Often true    Ran Out of Food in the Last Year: Often true  Transportation Needs: No Transportation Needs (09/02/2022)   PRAPARE - Administrator, Civil Service (Medical): No    Lack of Transportation (Non-Medical): No  Physical Activity: Insufficiently Active (09/02/2022)   Exercise Vital Sign    Days of Exercise per Week: 2 days    Minutes of Exercise per Session: 30 min  Stress: Stress Concern Present (09/02/2022)   [employer] of Occupational Health - Occupational Stress Questionnaire    Feeling of Stress : Rather  much  Social Connections: Moderately Integrated (09/02/2022)   Social Connection and Isolation Panel [NHANES]    Frequency of Communication with Friends and Family: More than three times a week    Frequency of Social Gatherings with Friends and Family: More than three times a week    Attends Religious Services: More than 4 times per year    Active Member of [employer] or Organizations: Yes    Attends Engineer, structural: More than 4 times per year    Marital Status: Divorced    Allergies:  Allergies  Allergen Reactions   Amoxicillin Anaphylaxis and Other (See Comments)    Has patient had a PCN reaction causing immediate rash, facial/tongue/throat swelling, SOB or lightheadedness with hypotension: yes Has patient had a PCN reaction causing severe rash involving mucus membranes or skin necrosis: no Has patient had a PCN reaction that required hospitalization yes Has patient had a PCN reaction occurring within the last 10 years: yes If all of the above answers are "NO", then may proceed with Cephalosporin use.   Penicillins Anaphylaxis and Other (See Comments)    Patient was told to list this as an allergy because he is allergic to Amoxicillin Has patient had a PCN reaction causing immediate rash, facial/tongue/throat swelling, SOB or lightheadedness with hypotension: yes for Amoxicillin Has patient had a PCN reaction causing severe rash involving mucus membranes or skin necrosis: no Has patient had a PCN reaction that required hospitalization yes for Amoxicillin Has patient had a PCN reaction occurring within the last 10 years: yes for Amoxicillin If all o   Metformin And Related Other (See Comments)    SEVERE GI UPSET   Other Other (See Comments)    Powder in some gloves - localized itching but not allergic to latex, benadryl usually helps with this reaction   Latex Itching   Zolpidem Other (See Comments)    Passed out. And memory loss    Current Medications: Current  Outpatient Medications  Medication Sig Dispense Refill   acetaminophen (TYLENOL) 500 MG tablet Take 1,000 mg by mouth every 6 (six) hours as needed for mild pain.     amLODipine (NORVASC) 10 MG tablet Take 1 tablet (10 mg total) by mouth daily. 90 tablet 3   blood glucose meter kit and supplies  Dispense based on patient and insurance preference. Use up to four times daily as directed. (FOR ICD-10 E10.9, E11.9). 1 each 0   Continuous Blood Gluc Sensor (FREESTYLE LIBRE 3 SENSOR) MISC 1 application by Does not apply route 2 (two) times daily. Place 1 sensor on the skin every 14 days. Use to check glucose continuously 2 each 5   cyclobenzaprine (FLEXERIL) 10 MG tablet TAKE 1 TABLET BY MOUTH THREE TIMES A DAY AS NEEDED FOR MUSCLE SPASM 90 tablet 1   dapagliflozin propanediol (FARXIGA) 10 MG TABS tablet Take 1 tablet (10 mg total) by mouth daily before breakfast. 90 tablet 3   Dulaglutide (TRULICITY) 4.5 MG/0.5ML SOPN Inject 4.5 mg as directed once a week. 6 mL 3   fluticasone (FLONASE) 50 MCG/ACT nasal spray Place 2 sprays into both nostrils daily. 16 g 0   gabapentin (NEURONTIN) 300 MG capsule Take 1 capsule (300 mg total) by mouth 3 (three) times daily. 90 capsule 0   glipiZIDE (GLUCOTROL) 10 MG tablet Take 1 tablet (10 mg total) by mouth 2 (two) times daily before a meal. 180 tablet 3   loratadine (CLARITIN) 10 MG tablet Take 10 mg by mouth as needed for allergies.     losartan-hydrochlorothiazide (HYZAAR) 100-12.5 MG tablet Take 1 tablet by mouth daily. 90 tablet 3   mirtazapine (REMERON) 7.5 MG tablet Take 1 tablet (7.5 mg total) by mouth at bedtime. 30 tablet 2   omeprazole (PRILOSEC) 40 MG capsule Take 1 capsule (40 mg total) by mouth 2 (two) times daily before a meal. 180 capsule 3   OXcarbazepine (TRILEPTAL) 150 MG tablet Take 1 tablet (150 mg total) by mouth 2 (two) times daily. 180 tablet 3   rosuvastatin (CRESTOR) 20 MG tablet Take 1 tablet (20 mg total) by mouth daily. 90 tablet 3    sildenafil (VIAGRA) 100 MG tablet TAKE 0.5-1 TABLETS BY MOUTH DAILY AS NEEDED FOR ERECTILE DYSFUNCTION. 5 tablet 11   tadalafil (CIALIS) 20 MG tablet Take 0.5-1 tablets (10-20 mg total) by mouth every other day as needed for erectile dysfunction. 10 tablet 11   venlafaxine XR (EFFEXOR XR) 37.5 MG 24 hr capsule Take 1 capsule (37.5 mg total) by mouth daily. Take with 150 mg dose for a total of 187.5 mg 30 capsule 2   venlafaxine XR (EFFEXOR-XR) 150 MG 24 hr capsule Take 1 capsule (150 mg total) by mouth daily. Take with 37.5 mg dose for a total of 187.5 mg daily 30 capsule 2   No current facility-administered medications for this visit.     Psychiatric Specialty Exam: Review of Systems  Blood pressure 124/83, pulse (!) 120, height 6\' 3"  (1.905 m), weight (!) 313 lb (142 kg).Body mass index is 39.12 kg/m.  General Appearance: Well Groomed  Eye Contact:  Good  Speech:  Clear and Coherent and Normal Rate  Volume:  Normal  Mood:  Depressed and Euthymic  Affect:  Congruent  Thought Process:  Coherent, Goal Directed, and Descriptions of Associations: Circumstantial  Orientation:  Full (Time, Place, and Person)  Thought Content: Logical   Suicidal Thoughts:  No  Homicidal Thoughts:  No  Memory:  Immediate;   Good  Judgement:  Good  Insight:  Fair  Psychomotor Activity:  Normal  Concentration:  Concentration: Fair  Recall:  Fair  Fund of Knowledge: Good  Language: Good  Akathisia:  NA    AIMS (if indicated): not done  Assets:  Communication Skills Desire for Improvement Housing Resilience Social Support Curator  Vocational/Educational  ADL's:  Intact  Cognition: WNL  Sleep:  Good   Metabolic Disorder Labs: Lab Results  Component Value Date   HGBA1C 10.1 (A) 03/08/2022   MPG 275 01/15/2019   MPG 240 04/28/2016   No results found for: "PROLACTIN" Lab Results  Component Value Date   CHOL 171 04/23/2021   TRIG 156.0 (H) 04/23/2021   HDL 40.20  04/23/2021   CHOLHDL 4 04/23/2021   VLDL 31.2 04/23/2021   LDLCALC 100 (H) 04/23/2021   LDLCALC 77 06/10/2020   Lab Results  Component Value Date   TSH 2.359 07/11/2014   TSH 1.03 03/11/2008    Therapeutic Level Labs: No results found for: "LITHIUM" No results found for: "VALPROATE" No results found for: "CBMZ"   Screenings: GAD-7    Flowsheet Row Office Visit from 02/07/2020 in [facility] Care at [facility]  Total GAD-7 Score 0      PHQ2-9    Flowsheet Row Office Visit from 03/08/2022 in [facility] [place] Office Visit from 02/18/2022 in [facility] Office Visit from 10/27/2021 in [facility] [place] Office Visit from 09/30/2021 in [facility] [place] Office Visit from 09/08/2021 in [facility] [place]  PHQ-2 Total Score 4 0 4 3 2   PHQ-9 Total Score 10 -- 20 13 10       Flowsheet Row ED from 09/27/2021 in [facility] Emergency Department at [facility]  C-SSRS RISK CATEGORY No Risk       Collaboration of Care: Collaboration of Care: Medication Management AEB medication prescription and Other provider involved in patient's care AEB neurology chart review  Patient/Guardian was advised Release of Information must be obtained prior to any record release in order to collaborate their care with an outside provider. Patient/Guardian was advised if they have not already done so to contact the registration department to sign all necessary forms in order for [place] to release information regarding their care.   Consent: Patient/Guardian gives verbal consent for treatment and assignment of benefits for services provided during this visit. Patient/Guardian expressed understanding and agreed to proceed.    Stasia Cavalier, MD 09/06/2022, 4:13 PM    Stasia Cavalier, MD

## 2022-09-07 ENCOUNTER — Telehealth (HOSPITAL_COMMUNITY): Payer: Self-pay | Admitting: *Deleted

## 2022-09-07 NOTE — Telephone Encounter (Signed)
Referrals for neuropysch testing sent to [facility] and [facility].

## 2022-09-07 NOTE — Telephone Encounter (Signed)
Medication management - A prior authorization for patient's prescribed Atomoxetine HCL 18 mg started online with CoverMyMeds and pending decision.

## 2022-09-08 ENCOUNTER — Ambulatory Visit: Payer: BC Managed Care – PPO | Admitting: Emergency Medicine

## 2022-09-11 ENCOUNTER — Other Ambulatory Visit: Payer: Self-pay | Admitting: Emergency Medicine

## 2022-09-13 ENCOUNTER — Other Ambulatory Visit: Payer: Self-pay | Admitting: Emergency Medicine

## 2022-09-13 DIAGNOSIS — E785 Hyperlipidemia, unspecified: Secondary | ICD-10-CM

## 2022-09-13 DIAGNOSIS — E1159 Type 2 diabetes mellitus with other circulatory complications: Secondary | ICD-10-CM

## 2022-09-13 DIAGNOSIS — I10 Essential (primary) hypertension: Secondary | ICD-10-CM

## 2022-09-27 ENCOUNTER — Other Ambulatory Visit (HOSPITAL_COMMUNITY): Payer: Self-pay | Admitting: Psychiatry

## 2022-09-27 DIAGNOSIS — F321 Major depressive disorder, single episode, moderate: Secondary | ICD-10-CM

## 2022-09-28 ENCOUNTER — Other Ambulatory Visit (HOSPITAL_COMMUNITY): Payer: Self-pay | Admitting: Psychiatry

## 2022-09-28 DIAGNOSIS — F321 Major depressive disorder, single episode, moderate: Secondary | ICD-10-CM

## 2022-10-11 ENCOUNTER — Ambulatory Visit (HOSPITAL_BASED_OUTPATIENT_CLINIC_OR_DEPARTMENT_OTHER): Payer: BC Managed Care – PPO | Admitting: Psychiatry

## 2022-10-11 ENCOUNTER — Encounter (HOSPITAL_COMMUNITY): Payer: Self-pay | Admitting: Psychiatry

## 2022-10-11 ENCOUNTER — Other Ambulatory Visit (HOSPITAL_COMMUNITY): Payer: Self-pay | Admitting: *Deleted

## 2022-10-11 VITALS — BP 139/95 | HR 112 | Ht 75.0 in | Wt 310.0 lb

## 2022-10-11 DIAGNOSIS — M48062 Spinal stenosis, lumbar region with neurogenic claudication: Secondary | ICD-10-CM

## 2022-10-11 DIAGNOSIS — F901 Attention-deficit hyperactivity disorder, predominantly hyperactive type: Secondary | ICD-10-CM | POA: Diagnosis not present

## 2022-10-11 DIAGNOSIS — F321 Major depressive disorder, single episode, moderate: Secondary | ICD-10-CM

## 2022-10-11 DIAGNOSIS — F900 Attention-deficit hyperactivity disorder, predominantly inattentive type: Secondary | ICD-10-CM

## 2022-10-11 MED ORDER — MIRTAZAPINE 15 MG PO TABS
15.0000 mg | ORAL_TABLET | Freq: Every day | ORAL | 2 refills | Status: DC
Start: 2022-10-11 — End: 2023-12-01

## 2022-10-11 MED ORDER — ATOMOXETINE HCL 40 MG PO CAPS
40.0000 mg | ORAL_CAPSULE | Freq: Every day | ORAL | 2 refills | Status: DC
Start: 2022-10-11 — End: 2023-11-22

## 2022-10-11 NOTE — Progress Notes (Signed)
BH MD/PA/NP OP Progress Note  10/11/2022 3:31 PM [name]  MRN:  [id number]  Visit Diagnosis:    ICD-10-CM   1. Major depressive disorder, single episode, moderate (HCC)  F32.1 atomoxetine (STRATTERA) 40 MG capsule    mirtazapine (REMERON) 15 MG tablet    2. Attention deficit hyperactivity disorder (ADHD), predominantly hyperactive type  F90.1 atomoxetine (STRATTERA) 40 MG capsule      Assessment: [name] is a 42 y.o. male with a history of PTSD and dysthymia, as well as a medical history of T2DM with resulting ulcer, hypertension, and 3 episodes of "transient alterations of awareness", who presented to [facility] for initial evaluation of depression on 02/19/22.  At time of initial evaluation patient reported neurovegetative symptoms of depression including low mood, anhedonia, fatigue, sleep disturbances, negative thought patterns, and anxiety. He also endorsed mood lability however he denied any signs of mania. Patient denied any hx of AVH, paranoia, delusions, SI/HI or thoughts of self harm. Patient does report having access to firearms, which he reports are stored safely. Patient meets criteria for major depressive disorder.    [name] presents for follow-up evaluation. Today, 10/11/22, patient reports no significant change in depression symptoms while anxiety remains well controlled.  He still struggles with poor sleep, fatigue, anhedonia, amotivation, poor concentration, and poor focus.  He denies any notable change with the initiation of Atarax or atomoxetine.  Patient also denies any adverse side effects other than some increased sedation in the mornings after starting Atarax.  Patient had decreased the dose to 5 mg which improved this.  We will increase mirtazapine to 15 mg at bedtime today and atomoxetine to 40 mg in the morning.  Risk and benefits of both were reviewed.  Patient was encouraged to follow up about neuropsych testing and was  referred to therapy today.  Concerned about sleep apnea was expressed and patient was referred to sleep specialist for sleep study.  Plan: - Continue Effexor XR 150 mg QD - Increase Remeron 15 mg QHS  - Increase atomextine 40 mg QD - Decrease Atarax to 5 mg QHS prn for anxiety - Continue Trileptal 150 mg BID for seizure disorder managed by outpatient neurologist - Continue Flexeril 20 mg BID prn for muscle spasms managed by PCP - Continue Gabapentin 300 mg BID managed by his neurosurgeon for back pain - Neuropsych testing referral - Sleep specialist referral - CMP, CBC, A1c reviewed - Therapy referral - Crisis resources discussed - Follow up in a month  Chief Complaint:  Chief Complaint  Patient presents with   Follow-up   HPI: [name] presents reporting that the past month has been about the same in regards to his depression, sleep, and motivation.  He has still been able to go to work and has been a little bit more active with his kids lately, but not as much as he had been in the past.  As for patient's sleep he reports that the Atarax has helped him fall asleep initially however he will wake up after around 3 to 4 hours and be unable to return to sleep for a couple hours.  When he does finally get back to sleep he only has about 30 to 60 minutes before he has to be up to get ready for work.  We had discussed the possibility of increasing the Atarax however patient reports he had only been taking 5 mg and even then found himself feeling a little bit groggy the  following morning.  In regards to concentration/memory symptoms patient reports no significant improvement since starting atomoxetine.  Patient can still find himself getting distracted and feeling over fatigue.  He denies any notable side effects from the medication.  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  We discussed setting up schedule  he takes an hour a day to review certain number of slides with a goal of getting through one of the 7 modules each week.  After completing the program patient will take his state exam before moving on to get his associates/bachelor's degree.  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    Discuss treatment plan for patient's low mood, anhedonia, amotivation, poor concentration, and fatigue.  We will increase mirtazapine for mood and sleep symptoms.  Will also increase atomoxetine to improve concentration and focus.  Patient had been contacted about neuropsych testing and was encouraged to complete the paperwork to move forward.  Therapy was recommended and patient was provided with referral.  We expressed some concern about sleep apnea which could be contributing to his symptoms.  Patient does report that he snores at night and is obese.  He was open to referral to a sleep specialist to rule out sleep apnea.  Past Psychiatric History: Denies any prior suicide attempts or psychiatric hospitalizations.  Patient reports his ex wife filed an IVC once on him.  However he was immediately cleared by a psychiatrist when evaluated and his wife had filed a IVC the night before a court hearing for custody of the kids.  Past medication trials: Numerous antidepressant medications including Zoloft, Lexapro, Cymbalta, Wellbutrin, trazodone, Ambien and Prozac  He does use smokeless tobacco, 1 can/day.  He does not use illegal drugs. The patient denies drinking alcohol.   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    PTSD (post-traumatic stress disorder)    Scoliosis    Sleep apnea     Past Surgical History:  Procedure Laterality  Date   LUMBAR LAMINECTOMY/DECOMPRESSION MICRODISCECTOMY Left 05/05/2016   Procedure: CENTRAL DECOMPRESSION L4-L5 AND FORAMINOTOMY FOR L4 ROOT AND L5 ROOT ON THE LEFT;  Surgeon: Ranee Gosselin, MD;  Location: [facility];  Service: Orthopedics;  Laterality: Left;   NO PAST SURGERIES      Family Psychiatric History: Denies  Family History:  Family History  Problem Relation Age of Onset   Stroke Maternal Grandmother    Colon cancer Neg Hx    Esophageal cancer Neg Hx    Rectal cancer Neg Hx    Stomach cancer Neg Hx     Social History:  Social History   Socioeconomic History   Marital status: Divorced    Spouse name: Not on file   Number of children: Not on file   Years of education: Not on file   Highest education level: Some college, no degree  Occupational History   Not on file  Tobacco Use   Smoking status: Former    Types: Cigars   Smokeless tobacco: Current    Types: Snuff  Vaping  Use   Vaping Use: Never used  Substance and Sexual Activity   Alcohol use: Yes    Alcohol/week: 5.0 standard drinks of alcohol    Types: 5 Shots of liquor per week    Comment: few times year   Drug use: No   Sexual activity: Not on file  Other Topics Concern   Not on file  Social History Narrative   Not on file   Social Determinants of Health   Financial Resource Strain: Low Risk  (09/02/2022)   Overall Financial Resource Strain (CARDIA)    Difficulty of Paying Living Expenses: Not very hard  Food Insecurity: Food Insecurity Present (09/02/2022)   Hunger Vital Sign    Worried About Running Out of Food in the Last Year: Often true    Ran Out of Food in the Last Year: Often true  Transportation Needs: No Transportation Needs (09/02/2022)   PRAPARE - Administrator, Civil Service (Medical): No    Lack of Transportation (Non-Medical): No  Physical Activity: Insufficiently Active (09/02/2022)   Exercise Vital Sign    Days of Exercise per Week: 2 days    Minutes of Exercise per  Session: 30 min  Stress: Stress Concern Present (09/02/2022)   [employer] of Occupational Health - Occupational Stress Questionnaire    Feeling of Stress : Rather much  Social Connections: Moderately Integrated (09/02/2022)   Social Connection and Isolation Panel [NHANES]    Frequency of Communication with Friends and Family: More than three times a week    Frequency of Social Gatherings with Friends and Family: More than three times a week    Attends Religious Services: More than 4 times per year    Active Member of [employer] or Organizations: Yes    Attends Engineer, structural: More than 4 times per year    Marital Status: Divorced    Allergies:  Allergies  Allergen Reactions   Amoxicillin Anaphylaxis and Other (See Comments)    Has patient had a PCN reaction causing immediate rash, facial/tongue/throat swelling, SOB or lightheadedness with hypotension: yes Has patient had a PCN reaction causing severe rash involving mucus membranes or skin necrosis: no Has patient had a PCN reaction that required hospitalization yes Has patient had a PCN reaction occurring within the last 10 years: yes If all of the above answers are "NO", then may proceed with Cephalosporin use.   Penicillins Anaphylaxis and Other (See Comments)    Patient was told to list this as an allergy because he is allergic to Amoxicillin Has patient had a PCN reaction causing immediate rash, facial/tongue/throat swelling, SOB or lightheadedness with hypotension: yes for Amoxicillin Has patient had a PCN reaction causing severe rash involving mucus membranes or skin necrosis: no Has patient had a PCN reaction that required hospitalization yes for Amoxicillin Has patient had a PCN reaction occurring within the last 10 years: yes for Amoxicillin If all o   Metformin And Related Other (See Comments)    SEVERE GI UPSET   Other Other (See Comments)    Powder in some gloves - localized itching but not allergic to  latex, benadryl usually helps with this reaction   Latex Itching   Zolpidem Other (See Comments)    Passed out. And memory loss    Current Medications: Current Outpatient Medications  Medication Sig Dispense Refill   acetaminophen (TYLENOL) 500 MG tablet Take 1,000 mg by mouth every 6 (six) hours as needed for mild pain.  amLODipine (NORVASC) 10 MG tablet TAKE 1 TABLET BY MOUTH EVERY DAY 90 tablet 3   atomoxetine (STRATTERA) 40 MG capsule Take 1 capsule (40 mg total) by mouth daily. 30 capsule 2   blood glucose meter kit and supplies Dispense based on patient and insurance preference. Use up to four times daily as directed. (FOR ICD-10 E10.9, E11.9). 1 each 0   Continuous Blood Gluc Sensor (FREESTYLE LIBRE 3 SENSOR) MISC 1 application by Does not apply route 2 (two) times daily. Place 1 sensor on the skin every 14 days. Use to check glucose continuously 2 each 5   cyclobenzaprine (FLEXERIL) 10 MG tablet TAKE 1 TABLET BY MOUTH THREE TIMES A DAY AS NEEDED FOR MUSCLE SPASM 90 tablet 1   dapagliflozin propanediol (FARXIGA) 10 MG TABS tablet Take 1 tablet (10 mg total) by mouth daily before breakfast. 90 tablet 3   Dulaglutide (TRULICITY) 4.5 MG/0.5ML SOPN Inject 4.5 mg as directed once a week. 6 mL 3   fluticasone (FLONASE) 50 MCG/ACT nasal spray Place 2 sprays into both nostrils daily. 16 g 0   gabapentin (NEURONTIN) 300 MG capsule Take 1 capsule (300 mg total) by mouth 3 (three) times daily. 90 capsule 0   glipiZIDE (GLUCOTROL) 10 MG tablet TAKE 1 TABLET (10 MG TOTAL) BY MOUTH TWICE A DAY BEFORE A MEAL 180 tablet 3   hydrOXYzine (ATARAX) 10 MG tablet TAKE 1 TABLET (10 MG TOTAL) BY MOUTH AT BEDTIME AS NEEDED FOR ANXIETY 90 tablet 1   loratadine (CLARITIN) 10 MG tablet Take 10 mg by mouth as needed for allergies.     losartan-hydrochlorothiazide (HYZAAR) 100-12.5 MG tablet TAKE 1 TABLET BY MOUTH EVERY DAY 90 tablet 3   mirtazapine (REMERON) 15 MG tablet Take 1 tablet (15 mg total) by mouth at  bedtime. 30 tablet 2   omeprazole (PRILOSEC) 40 MG capsule Take 1 capsule (40 mg total) by mouth 2 (two) times daily before a meal. 180 capsule 3   OXcarbazepine (TRILEPTAL) 150 MG tablet Take 1 tablet (150 mg total) by mouth 2 (two) times daily. 180 tablet 3   rosuvastatin (CRESTOR) 20 MG tablet TAKE 1 TABLET BY MOUTH EVERY DAY 90 tablet 3   sildenafil (VIAGRA) 100 MG tablet TAKE 0.5-1 TABLETS BY MOUTH DAILY AS NEEDED FOR ERECTILE DYSFUNCTION. 5 tablet 11   tadalafil (CIALIS) 20 MG tablet Take 0.5-1 tablets (10-20 mg total) by mouth every other day as needed for erectile dysfunction. 10 tablet 11   venlafaxine XR (EFFEXOR-XR) 150 MG 24 hr capsule Take 1 capsule (150 mg total) by mouth daily. 30 capsule 2   No current facility-administered medications for this visit.     Psychiatric Specialty Exam: Review of Systems  Blood pressure (!) 139/95, pulse (!) 112, height 6\' 3"  (1.905 m), weight (!) 310 lb (140.6 kg), SpO2 97 %.Body mass index is 38.75 kg/m.  General Appearance: Well Groomed  Eye Contact:  Good  Speech:  Clear and Coherent and Normal Rate  Volume:  Normal  Mood:  Depressed and Euthymic  Affect:  Blunt  Thought Process:  Coherent, Goal Directed, and Descriptions of Associations: Circumstantial  Orientation:  Full (Time, Place, and Person)  Thought Content: Logical   Suicidal Thoughts:  No  Homicidal Thoughts:  No  Memory:  Immediate;   Good  Judgement:  Good  Insight:  Fair  Psychomotor Activity:  Normal  Concentration:  Concentration: Fair  Recall:  Fair  Fund of Knowledge: Good  Language: Good  Akathisia:  NA  AIMS (if indicated): not done  Assets:  Communication Skills Desire for Improvement Housing Resilience Social Support Talents/Skills Transportation Vocational/Educational  ADL's:  Intact  Cognition: WNL  Sleep:  Good   Metabolic Disorder Labs: Lab Results  Component Value Date   HGBA1C 10.1 (A) 03/08/2022   MPG 275 01/15/2019   MPG 240  04/28/2016   No results found for: "PROLACTIN" Lab Results  Component Value Date   CHOL 171 04/23/2021   TRIG 156.0 (H) 04/23/2021   HDL 40.20 04/23/2021   CHOLHDL 4 04/23/2021   VLDL 31.2 04/23/2021   LDLCALC 100 (H) 04/23/2021   LDLCALC 77 06/10/2020   Lab Results  Component Value Date   TSH 2.359 07/11/2014   TSH 1.03 03/11/2008    Therapeutic Level Labs: No results found for: "LITHIUM" No results found for: "VALPROATE" No results found for: "CBMZ"   Screenings: GAD-7    Flowsheet Row Office Visit from 02/07/2020 in [facility] at [facility]  Total GAD-7 Score 0      PHQ2-9    Flowsheet Row Office Visit from 03/08/2022 in [facility] [place] Office Visit from 02/18/2022 in [facility] Office Visit from 10/27/2021 in [facility] [place] Office Visit from 09/30/2021 in [facility] [place] Office Visit from 09/08/2021 in [facility] [place]  PHQ-2 Total Score 4 0 4 3 2   PHQ-9 Total Score 10 -- 20 13 10       Flowsheet Row ED from 09/27/2021 in [facility] Department at [facility]  C-SSRS RISK CATEGORY No Risk       Collaboration of Care: Collaboration of Care: Medication Management AEB medication prescription  Patient/Guardian was advised Release of Information must be obtained prior to any record release in order to collaborate their care with an outside provider. Patient/Guardian was advised if they have not already done so to contact the registration department to sign all necessary forms in order for [place] to release information regarding their care.   Consent: Patient/Guardian gives verbal consent for treatment and assignment of benefits for services provided during this visit. Patient/Guardian expressed understanding and agreed to proceed.    Stasia Cavalier, MD 10/11/2022, 3:31 PM    Stasia Cavalier, MD

## 2022-10-13 ENCOUNTER — Telehealth (HOSPITAL_COMMUNITY): Payer: Self-pay | Admitting: *Deleted

## 2022-10-13 NOTE — Telephone Encounter (Signed)
Referrals for ADHD testing made to [facility] and [facility].

## 2022-10-18 ENCOUNTER — Other Ambulatory Visit: Payer: Self-pay | Admitting: Emergency Medicine

## 2022-10-18 DIAGNOSIS — E1159 Type 2 diabetes mellitus with other circulatory complications: Secondary | ICD-10-CM

## 2022-10-25 ENCOUNTER — Other Ambulatory Visit: Payer: Self-pay | Admitting: Emergency Medicine

## 2022-10-25 DIAGNOSIS — I152 Hypertension secondary to endocrine disorders: Secondary | ICD-10-CM

## 2022-10-27 ENCOUNTER — Other Ambulatory Visit (HOSPITAL_COMMUNITY): Payer: Self-pay | Admitting: Psychiatry

## 2022-10-27 DIAGNOSIS — F321 Major depressive disorder, single episode, moderate: Secondary | ICD-10-CM

## 2022-11-06 ENCOUNTER — Other Ambulatory Visit (HOSPITAL_COMMUNITY): Payer: Self-pay | Admitting: Psychiatry

## 2022-11-06 DIAGNOSIS — F901 Attention-deficit hyperactivity disorder, predominantly hyperactive type: Secondary | ICD-10-CM

## 2022-11-06 DIAGNOSIS — F321 Major depressive disorder, single episode, moderate: Secondary | ICD-10-CM

## 2022-11-08 ENCOUNTER — Ambulatory Visit: Payer: BC Managed Care – PPO | Admitting: Licensed Clinical Social Worker

## 2022-11-09 ENCOUNTER — Ambulatory Visit (INDEPENDENT_AMBULATORY_CARE_PROVIDER_SITE_OTHER): Payer: BC Managed Care – PPO | Admitting: Emergency Medicine

## 2022-11-09 ENCOUNTER — Other Ambulatory Visit: Payer: Self-pay | Admitting: Gastroenterology

## 2022-11-09 ENCOUNTER — Encounter: Payer: Self-pay | Admitting: Emergency Medicine

## 2022-11-09 ENCOUNTER — Other Ambulatory Visit: Payer: Self-pay | Admitting: Emergency Medicine

## 2022-11-09 VITALS — BP 118/64 | HR 115 | Temp 98.2°F | Ht 75.0 in | Wt 301.4 lb

## 2022-11-09 DIAGNOSIS — E785 Hyperlipidemia, unspecified: Secondary | ICD-10-CM

## 2022-11-09 DIAGNOSIS — E1169 Type 2 diabetes mellitus with other specified complication: Secondary | ICD-10-CM

## 2022-11-09 DIAGNOSIS — L97411 Non-pressure chronic ulcer of right heel and midfoot limited to breakdown of skin: Secondary | ICD-10-CM

## 2022-11-09 DIAGNOSIS — I152 Hypertension secondary to endocrine disorders: Secondary | ICD-10-CM

## 2022-11-09 DIAGNOSIS — E1159 Type 2 diabetes mellitus with other circulatory complications: Secondary | ICD-10-CM | POA: Diagnosis not present

## 2022-11-09 DIAGNOSIS — E291 Testicular hypofunction: Secondary | ICD-10-CM

## 2022-11-09 DIAGNOSIS — E11621 Type 2 diabetes mellitus with foot ulcer: Secondary | ICD-10-CM

## 2022-11-09 DIAGNOSIS — F32A Depression, unspecified: Secondary | ICD-10-CM

## 2022-11-09 DIAGNOSIS — L97421 Non-pressure chronic ulcer of left heel and midfoot limited to breakdown of skin: Secondary | ICD-10-CM

## 2022-11-09 DIAGNOSIS — Z794 Long term (current) use of insulin: Secondary | ICD-10-CM | POA: Diagnosis not present

## 2022-11-09 DIAGNOSIS — N5201 Erectile dysfunction due to arterial insufficiency: Secondary | ICD-10-CM

## 2022-11-09 LAB — POCT GLYCOSYLATED HEMOGLOBIN (HGB A1C): Hemoglobin A1C: 12.3 % — AB (ref 4.0–5.6)

## 2022-11-09 LAB — CBC WITH DIFFERENTIAL/PLATELET
Basophils Absolute: 0 10*3/uL (ref 0.0–0.1)
Basophils Relative: 0.4 % (ref 0.0–3.0)
Eosinophils Absolute: 0.1 10*3/uL (ref 0.0–0.7)
Eosinophils Relative: 1 % (ref 0.0–5.0)
HCT: 39.1 % (ref 39.0–52.0)
Hemoglobin: 12.9 g/dL — ABNORMAL LOW (ref 13.0–17.0)
Lymphocytes Relative: 22.2 % (ref 12.0–46.0)
Lymphs Abs: 2.3 10*3/uL (ref 0.7–4.0)
MCHC: 32.9 g/dL (ref 30.0–36.0)
MCV: 80.8 fl (ref 78.0–100.0)
Monocytes Absolute: 0.5 10*3/uL (ref 0.1–1.0)
Monocytes Relative: 5.1 % (ref 3.0–12.0)
Neutro Abs: 7.4 10*3/uL (ref 1.4–7.7)
Neutrophils Relative %: 71.3 % (ref 43.0–77.0)
Platelets: 345 10*3/uL (ref 150.0–400.0)
RBC: 4.84 Mil/uL (ref 4.22–5.81)
RDW: 14.6 % (ref 11.5–15.5)
WBC: 10.4 10*3/uL (ref 4.0–10.5)

## 2022-11-09 LAB — COMPREHENSIVE METABOLIC PANEL
ALT: 23 U/L (ref 0–53)
AST: 13 U/L (ref 0–37)
Albumin: 4.3 g/dL (ref 3.5–5.2)
Alkaline Phosphatase: 59 U/L (ref 39–117)
BUN: 18 mg/dL (ref 6–23)
CO2: 30 mEq/L (ref 19–32)
Calcium: 9.8 mg/dL (ref 8.4–10.5)
Chloride: 94 mEq/L — ABNORMAL LOW (ref 96–112)
Creatinine, Ser: 0.98 mg/dL (ref 0.40–1.50)
GFR: 96.72 mL/min (ref >60.00)
Glucose, Bld: 380 mg/dL — ABNORMAL HIGH (ref 70–99)
Potassium: 3.9 mEq/L (ref 3.5–5.1)
Sodium: 134 mEq/L — ABNORMAL LOW (ref 135–145)
Total Bilirubin: 0.5 mg/dL (ref 0.2–1.2)
Total Protein: 7.8 g/dL (ref 6.0–8.3)

## 2022-11-09 LAB — LIPID PANEL
Cholesterol: 144 mg/dL (ref 0–200)
HDL: 29.9 mg/dL — ABNORMAL LOW (ref >39.00)
Total CHOL/HDL Ratio: 5
Triglycerides: 484 mg/dL — ABNORMAL HIGH (ref 0.0–149.0)

## 2022-11-09 LAB — LDL CHOLESTEROL, DIRECT: Direct LDL: 46 mg/dL

## 2022-11-09 LAB — TESTOSTERONE: Testosterone: 133.61 ng/dL — ABNORMAL LOW (ref 300.00–890.00)

## 2022-11-09 LAB — MICROALBUMIN / CREATININE URINE RATIO
Creatinine,U: 124.2 mg/dL
Microalb Creat Ratio: 2.4 mg/g (ref 0.0–30.0)
Microalb, Ur: 2.9 mg/dL — ABNORMAL HIGH (ref 0.0–1.9)

## 2022-11-09 MED ORDER — TRESIBA FLEXTOUCH 100 UNIT/ML ~~LOC~~ SOPN
15.0000 [IU] | PEN_INJECTOR | Freq: Every day | SUBCUTANEOUS | 5 refills | Status: DC
Start: 2022-11-09 — End: 2022-11-09

## 2022-11-09 NOTE — Assessment & Plan Note (Signed)
Diet and nutrition discussed Vies to decrease amount of daily carbohydrate intake and daily calories and increase amount of plant-based protein in his diet Benefits of exercise discussed

## 2022-11-09 NOTE — Assessment & Plan Note (Signed)
Well-controlled hypertension Continue amlodipine 10 mg and Hyzaar 100-12.5 mg daily Uncontrolled diabetes with hemoglobin A1c at 12.3 Cardiovascular risk associated with uncontrolled diabetes discussed Diet and nutrition discussed Intolerant to metformin and Farxiga Continue glipizide 10 mg twice a day Continue Trulicity 4.5 mg weekly Start Tresiba insulin 15 units daily May need endocrinology evaluation Follow-up in 3 months

## 2022-11-09 NOTE — Progress Notes (Signed)
[name] 42 y.o.   Chief Complaint  Patient presents with   Medical Management of Chronic Issues    F/u appt DM  Discuss weight loss/gain.  Discuss ED. Discuss discontinuing some meds. Possible ulcer on top of right foot, patient noticed it about a month ago.   Depression    HISTORY OF PRESENT ILLNESS: This is a 42 y.o. male here for follow-up of chronic medical conditions including diabetes History of chronic depression.  Sees psychiatrist on a regular basis.  On medications but not helping much. Intolerant to metformin and [place].  Taking Trulicity and glipizide Not eating very well. Still having erectile dysfunction Small ulcer to the top of right foot No other complaints or medical concerns today BP Readings from Last 3 Encounters:  08/10/22 (!) 135/92  03/08/22 128/84  02/16/22 128/89   Wt Readings from Last 3 Encounters:  11/09/22 (!) 301 lb 6 oz (136.7 kg)  08/10/22 (!) 308 lb (139.7 kg)  03/08/22 297 lb 2 oz (134.8 kg)   Lab Results  Component Value Date   HGBA1C 10.1 (A) 03/08/2022     HPI   Prior to Admission medications   Medication Sig Start Date End Date Taking? Authorizing Provider  acetaminophen (TYLENOL) 500 MG tablet Take 1,000 mg by mouth every 6 (six) hours as needed for mild pain.   Yes [provider]  amLODipine (NORVASC) 10 MG tablet TAKE 1 TABLET BY MOUTH EVERY DAY 09/13/22  Yes Alexandra Lipps, Eilleen Kempf, MD  atomoxetine (STRATTERA) 40 MG capsule Take 1 capsule (40 mg total) by mouth daily. 10/11/22 10/11/23 Yes Stasia Cavalier, MD  blood glucose meter kit and supplies Dispense based on patient and insurance preference. Use up to four times daily as directed. (FOR ICD-10 E10.9, E11.9). 01/16/19  Yes Rodolph Bong, MD  Continuous Blood Gluc Sensor (FREESTYLE LIBRE 3 SENSOR) MISC 1 application by Does not apply route 2 (two) times daily. Place 1 sensor on the skin every 14 days. Use to check glucose continuously 04/23/21  Yes James Senn,  Eilleen Kempf, MD  cyclobenzaprine (FLEXERIL) 10 MG tablet TAKE 1 TABLET BY MOUTH THREE TIMES A DAY AS NEEDED FOR MUSCLE SPASM 09/11/22  Yes Ruben Pyka, Eilleen Kempf, MD  Dulaglutide (TRULICITY) 4.5 MG/0.5ML SOPN INJECT 4.5 MG AS DIRECTED ONCE A WEEK. 10/25/22  Yes Celenia Hruska, Eilleen Kempf, MD  fluticasone [facility]) 50 MCG/ACT nasal spray Place 2 sprays into both nostrils daily. 01/14/21  Yes Viviano Simas, FNP  gabapentin (NEURONTIN) 300 MG capsule Take 1 capsule (300 mg total) by mouth 3 (three) times daily. 06/14/21  Yes Hawks, Christy A, FNP  glipiZIDE (GLUCOTROL) 10 MG tablet TAKE 1 TABLET (10 MG TOTAL) BY MOUTH TWICE A DAY BEFORE A MEAL 09/13/22  Yes Georgina Quint, MD  hydrOXYzine (ATARAX) 10 MG tablet TAKE 1 TABLET (10 MG TOTAL) BY MOUTH AT BEDTIME AS NEEDED FOR ANXIETY 09/29/22  Yes Stasia Cavalier, MD  loratadine (CLARITIN) 10 MG tablet Take 10 mg by mouth as needed for allergies.   Yes [provider]  losartan-hydrochlorothiazide (HYZAAR) 100-12.5 MG tablet TAKE 1 TABLET BY MOUTH EVERY DAY 09/13/22  Yes Morena Mckissack, Eilleen Kempf, MD  mirtazapine (REMERON) 15 MG tablet Take 1 tablet (15 mg total) by mouth at bedtime. 10/11/22  Yes Stasia Cavalier, MD  omeprazole (PRILOSEC) 40 MG capsule Take 1 capsule (40 mg total) by mouth 2 (two) times daily before a meal. 11/06/21  Yes Tressia Danas, MD  OXcarbazepine (TRILEPTAL) 150 MG tablet Take  1 tablet (150 mg total) by mouth 2 (two) times daily. 08/10/22 08/05/23 Yes Camara, Amalia Hailey, MD  rosuvastatin (CRESTOR) 20 MG tablet TAKE 1 TABLET BY MOUTH EVERY DAY 09/13/22  Yes Dezra Mandella, Eilleen Kempf, MD  sildenafil (VIAGRA) 100 MG tablet TAKE 0.5-1 TABLETS BY MOUTH DAILY AS NEEDED FOR ERECTILE DYSFUNCTION. 05/15/22  Yes Sheralyn Pinegar, Eilleen Kempf, MD  tadalafil (CIALIS) 20 MG tablet Take 0.5-1 tablets (10-20 mg total) by mouth every other day as needed for erectile dysfunction. 03/08/22  Yes Georgina Quint, MD  venlafaxine XR (EFFEXOR-XR) 150 MG 24 hr capsule Take 1  capsule (150 mg total) by mouth daily. 09/06/22 09/06/23 Yes Stasia Cavalier, MD  dapagliflozin propanediol (FARXIGA) 10 MG TABS tablet Take 1 tablet (10 mg total) by mouth daily before breakfast. Patient not taking: Reported on 11/09/2022 03/08/22   Georgina Quint, MD    Allergies  Allergen Reactions   Amoxicillin Anaphylaxis and Other (See Comments)    Has patient had a PCN reaction causing immediate rash, facial/tongue/throat swelling, SOB or lightheadedness with hypotension: yes Has patient had a PCN reaction causing severe rash involving mucus membranes or skin necrosis: no Has patient had a PCN reaction that required hospitalization yes Has patient had a PCN reaction occurring within the last 10 years: yes If all of the above answers are "NO", then may proceed with Cephalosporin use.   Penicillins Anaphylaxis and Other (See Comments)    Patient was told to list this as an allergy because he is allergic to Amoxicillin Has patient had a PCN reaction causing immediate rash, facial/tongue/throat swelling, SOB or lightheadedness with hypotension: yes for Amoxicillin Has patient had a PCN reaction causing severe rash involving mucus membranes or skin necrosis: no Has patient had a PCN reaction that required hospitalization yes for Amoxicillin Has patient had a PCN reaction occurring within the last 10 years: yes for Amoxicillin If all o   Metformin And Related Other (See Comments)    SEVERE GI UPSET   Other Other (See Comments)    Powder in some gloves - localized itching but not allergic to latex, benadryl usually helps with this reaction   Latex Itching   Zolpidem Other (See Comments)    Passed out. 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) 02/07/2020   Morbid obesity, unspecified obesity type (HCC) 01/10/2020   Hypertension associated with diabetes (HCC) 04/17/2018   Dyslipidemia 04/17/2018   PTSD (post-traumatic stress disorder) 08/11/2016   Spinal stenosis, lumbar region with neurogenic claudication 05/05/2016   Dysthymia 09/02/2015   Chronic low back pain 09/02/2015   Dyslipidemia associated with type 2 diabetes mellitus (HCC) 03/11/2008   UNSPECIFIED ANEMIA 03/11/2008    Past Medical History:  Diagnosis Date   Allergies    Allergy    Anemia    Anxiety    Depression    Diabetes mellitus    GERD (gastroesophageal reflux disease)    Hyperlipidemia    Hypertension  Lumbar disc herniation    Neuromuscular disorder (HCC)    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    PTSD (post-traumatic stress disorder)    Scoliosis    Sleep apnea     Past Surgical History:  Procedure Laterality Date   LUMBAR LAMINECTOMY/DECOMPRESSION MICRODISCECTOMY Left 05/05/2016   Procedure: CENTRAL DECOMPRESSION L4-L5 AND FORAMINOTOMY FOR L4 ROOT AND L5 ROOT ON THE LEFT;  Surgeon: Ranee Gosselin, MD;  Location: [facility];  Service: Orthopedics;  Laterality: Left;   NO PAST SURGERIES      Social History   Socioeconomic History   Marital status: Divorced    Spouse name: Not on file   Number of children: Not on file   Years of education: Not on file   Highest education level: Some college, no degree  Occupational History   Not on file  Tobacco Use   Smoking status: Former    Types: Cigars   Smokeless tobacco: Current    Types: Snuff  Vaping Use   Vaping Use: Never used  Substance and Sexual Activity   Alcohol use: Yes     Alcohol/week: 5.0 standard drinks of alcohol    Types: 5 Shots of liquor per week    Comment: few times year   Drug use: No   Sexual activity: Not on file  Other Topics Concern   Not on file  Social History Narrative   Not on file   Social Determinants of Health   Financial Resource Strain: Low Risk  (09/02/2022)   Overall Financial Resource Strain (CARDIA)    Difficulty of Paying Living Expenses: Not very hard  Food Insecurity: Food Insecurity Present (09/02/2022)   Hunger Vital Sign    Worried About Running Out of Food in the Last Year: Often true    Ran Out of Food in the Last Year: Often true  Transportation Needs: No Transportation Needs (09/02/2022)   PRAPARE - Administrator, Civil Service (Medical): No    Lack of Transportation (Non-Medical): No  Physical Activity: Insufficiently Active (09/02/2022)   Exercise Vital Sign    Days of Exercise per Week: 2 days    Minutes of Exercise per Session: 30 min  Stress: Stress Concern Present (09/02/2022)   [employer] of Occupational Health - Occupational Stress Questionnaire    Feeling of Stress : Rather much  Social Connections: Moderately Integrated (09/02/2022)   Social Connection and Isolation Panel [NHANES]    Frequency of Communication with Friends and Family: More than three times a week    Frequency of Social Gatherings with Friends and Family: More than three times a week    Attends Religious Services: More than 4 times per year    Active Member of [employer] or Organizations: Yes    Attends Engineer, structural: More than 4 times per year    Marital Status: Divorced  Catering manager Violence: Not on file    Family History  Problem Relation Age of Onset   Stroke Maternal Grandmother    Colon cancer Neg Hx    Esophageal cancer Neg Hx    Rectal cancer Neg Hx    Stomach cancer Neg Hx      Review of Systems  Constitutional: Negative.  Negative for chills and fever.  HENT: Negative.  Negative  for congestion and sore throat.   Respiratory: Negative.  Negative for cough and shortness of breath.   Cardiovascular: Negative.  Negative for chest pain and palpitations.  Gastrointestinal:  Negative for abdominal pain, diarrhea,  nausea and vomiting.  Genitourinary: Negative.  Negative for dysuria and hematuria.  Skin: Negative.  Negative for rash.  Neurological: Negative.  Negative for dizziness and headaches.  All other systems reviewed and are negative.   Today's Vitals   11/09/22 1514  BP: 118/64  Pulse: (!) 115  Temp: 98.2 F (36.8 C)  TempSrc: Oral  SpO2: 98%  Weight: (!) 301 lb 6 oz (136.7 kg)  Height: 6\' 3"  (1.905 m)   Body mass index is 37.67 kg/m.   Physical Exam Vitals reviewed.  Constitutional:      Appearance: Normal appearance. He is obese.  HENT:     Head: Normocephalic.     Mouth/Throat:     Mouth: Mucous membranes are moist.     Pharynx: Oropharynx is clear.  Eyes:     Extraocular Movements: Extraocular movements intact.     Conjunctiva/sclera: Conjunctivae normal.     Pupils: Pupils are equal, round, and reactive to light.  Cardiovascular:     Rate and Rhythm: Normal rate and regular rhythm.     Pulses: Normal pulses.     Heart sounds: Normal heart sounds.  Pulmonary:     Effort: Pulmonary effort is normal.     Breath sounds: Normal breath sounds.  Abdominal:     Palpations: Abdomen is soft.     Tenderness: There is no abdominal tenderness.  Musculoskeletal:     Cervical back: No tenderness.  Lymphadenopathy:     Cervical: No cervical adenopathy.  Skin:    General: Skin is warm and dry.     Capillary Refill: Capillary refill takes less than 2 seconds.  Neurological:     General: No focal deficit present.     Mental Status: He is alert and oriented to person, place, and time.  Psychiatric:        Mood and Affect: Mood normal.        Behavior: Behavior normal.     Results for orders placed or performed in visit on 11/09/22 (from the  past 24 hour(s))  POCT HgB A1C     Status: Abnormal   Collection Time: 11/09/22  4:49 PM  Result Value Ref Range   Hemoglobin A1C 12.3 (A) 4.0 - 5.6 %   HbA1c POC (<> result, manual entry)     HbA1c, POC (prediabetic range)     HbA1c, POC (controlled diabetic range)      ASSESSMENT & PLAN: A total of 48 minutes was spent with the patient and counseling/coordination of care regarding preparing for this visit, review of most recent office visit notes, review of most recent blood work results including interpretation of today's hemoglobin A1c, cardiovascular risk associated with uncontrolled diabetes, review of multiple chronic medical conditions under management, review of all medications and changes made, education on nutrition, prognosis, documentation, and need for follow-up.  Problem List Items Addressed This Visit       Cardiovascular and Mediastinum   Hypertension associated with diabetes (HCC) - Primary    Well-controlled hypertension Continue amlodipine 10 mg and Hyzaar 100-12.5 mg daily Uncontrolled diabetes with hemoglobin A1c at 12.3 Cardiovascular risk associated with uncontrolled diabetes discussed Diet and nutrition discussed Intolerant to metformin and Farxiga Continue glipizide 10 mg twice a day Continue Trulicity 4.5 mg weekly Start Tresiba insulin 15 units daily May need endocrinology evaluation Follow-up in 3 months      Relevant Medications   insulin degludec (TRESIBA FLEXTOUCH) 100 UNIT/ML FlexTouch Pen   Other Relevant Orders   POCT HgB  A1C (Completed)   CBC with Differential/Platelet   Comprehensive metabolic panel   Lipid panel   Urine Microalbumin w/creat. 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)    Needs evaluation at wound care center Referral placed today Needs better diabetes control      Relevant Medications   insulin degludec (TRESIBA FLEXTOUCH) 100 UNIT/ML FlexTouch Pen     Other   Morbid obesity, unspecified obesity type (HCC)    Diet and nutrition discussed Vies to decrease amount of daily carbohydrate intake and daily calories and increase amount of plant-based protein in his diet Benefits of exercise discussed      Relevant Medications   insulin degludec (TRESIBA FLEXTOUCH) 100 UNIT/ML FlexTouch Pen   Chronic depression    Sees psychiatrist on a regular basis On several medications handled by their office Depression still active.  Not suicidal. Needs closer follow-up with psychiatrist      Other Visit Diagnoses     Hypogonadism in male       Relevant Orders   Testosterone   Diabetic ulcer of right midfoot associated with type 2 diabetes mellitus, limited to breakdown of skin (HCC)       Relevant Medications   insulin degludec (TRESIBA FLEXTOUCH) 100 UNIT/ML FlexTouch Pen   Other Relevant Orders   AMB referral to wound care center      Patient Instructions  Diabetes Mellitus and Nutrition, Adult When you have diabetes, or diabetes mellitus, it is very important to have healthy eating habits because your blood sugar (glucose) levels are greatly affected by what you eat and drink. Eating healthy foods in the right amounts, at about the same times every day, can help you: Manage your blood glucose. Lower your risk of heart disease. Improve your blood pressure. Reach or maintain a healthy weight. What can  affect my meal plan? Every person with diabetes is different, and each person has different needs for a meal plan. Your health care provider may recommend that you work with a dietitian to make a meal plan that is best for you. Your meal plan may vary depending on factors such as: The calories you need. The medicines you take. Your weight. Your blood glucose, blood pressure, and cholesterol levels. Your activity level. Other health conditions you have, such as heart or kidney disease. How do carbohydrates affect me? Carbohydrates, also called carbs, affect your blood glucose level more than any other type of food. Eating carbs raises the amount of glucose in your blood. It is important to know how many carbs you can safely have in each meal. This is different for every person. Your dietitian can help you calculate how many carbs you should have at each meal and for each snack. How does alcohol affect me? Alcohol can cause a decrease in blood glucose (hypoglycemia), especially if you use insulin or take certain diabetes medicines by  mouth. Hypoglycemia can be a life-threatening condition. Symptoms of hypoglycemia, such as sleepiness, dizziness, and confusion, are similar to symptoms of having too much alcohol. Do not drink alcohol if: Your health care provider tells you not to drink. You are pregnant, may be pregnant, or are planning to become pregnant. If you drink alcohol: Limit how much you have to: 0-1 drink a day for women. 0-2 drinks a day for men. Know how much alcohol is in your drink. In the [place] one drink equals one 12 oz bottle of beer (355 mL), one 5 oz glass of wine (148 mL), or one 1 oz glass of hard liquor (44 mL). Keep yourself hydrated with water, diet soda, or unsweetened iced tea. Keep in mind that regular soda, juice, and other mixers may contain a lot of sugar and must be counted as carbs. What are tips for following this plan?  Reading food labels Start by checking the  serving size on the Nutrition Facts label of packaged foods and drinks. The number of calories and the amount of carbs, fats, and other nutrients listed on the label are based on one serving of the item. Many items contain more than one serving per package. Check the total grams (g) of carbs in one serving. Check the number of grams of saturated fats and trans fats in one serving. Choose foods that have a low amount or none of these fats. Check the number of milligrams (mg) of salt (sodium) in one serving. Most people should limit total sodium intake to less than 2,300 mg per day. Always check the nutrition information of foods labeled as "low-fat" or "nonfat." These foods may be higher in added sugar or refined carbs and should be avoided. Talk to your dietitian to identify your daily goals for nutrients listed on the label. Shopping Avoid buying canned, pre-made, or processed foods. These foods tend to be high in fat, sodium, and added sugar. Shop around the outside edge of the grocery store. This is where you will most often find fresh fruits and vegetables, bulk grains, fresh meats, and fresh dairy products. Cooking Use low-heat cooking methods, such as baking, instead of high-heat cooking methods, such as deep frying. Cook using healthy oils, such as olive, canola, or sunflower oil. Avoid cooking with butter, cream, or high-fat meats. Meal planning Eat meals and snacks regularly, preferably at the same times every day. Avoid going long periods of time without eating. Eat foods that are high in fiber, such as fresh fruits, vegetables, beans, and whole grains. Eat 4-6 oz (112-168 g) of lean protein each day, such as lean meat, chicken, fish, eggs, or tofu. One ounce (oz) (28 g) of lean protein is equal to: 1 oz (28 g) of meat, chicken, or fish. 1 egg.  cup (62 g) of tofu. Eat some foods each day that contain healthy fats, such as avocado, nuts, seeds, and fish. What foods should I  eat? Fruits Berries. Apples. Oranges. Peaches. Apricots. Plums. Grapes. Mangoes. Papayas. Pomegranates. Kiwi. Cherries. Vegetables Leafy greens, including lettuce, spinach, kale, chard, collard greens, mustard greens, and cabbage. Beets. Cauliflower. Broccoli. Carrots. Green beans. Tomatoes. Peppers. Onions. Cucumbers. Brussels sprouts. Grains Whole grains, such as whole-wheat or whole-grain bread, crackers, tortillas, cereal, and pasta. Unsweetened oatmeal. Quinoa. Brown or wild rice. Meats and other proteins Seafood. Poultry without skin. Lean cuts of poultry and beef. Tofu. Nuts. Seeds. Dairy Low-fat or fat-free dairy products such as milk, yogurt, and cheese. The items listed above may not be  a complete list of foods and beverages you can eat and drink. Contact a dietitian for more information. What foods should I avoid? Fruits Fruits canned with syrup. Vegetables Canned vegetables. Frozen vegetables with butter or cream sauce. Grains Refined white flour and flour products such as bread, pasta, snack foods, and cereals. Avoid all processed foods. Meats and other proteins Fatty cuts of meat. Poultry with skin. Breaded or fried meats. Processed meat. Avoid saturated fats. Dairy Full-fat yogurt, cheese, or milk. Beverages Sweetened drinks, such as soda or iced tea. The items listed above may not be a complete list of foods and beverages you should avoid. Contact a dietitian for more information. Questions to ask a health care provider Do I need to meet with a certified diabetes care and education specialist? Do I need to meet with a dietitian? What number can I call if I have questions? When are the best times to check my blood glucose? Where to find more information: American Diabetes Association: diabetes.org Academy of Nutrition and Dietetics: eatright.[employer] of Diabetes and Digestive and Kidney Diseases: StageSync.si Association of Diabetes Care & Education  Specialists: diabeteseducator.org Summary It is important to have healthy eating habits because your blood sugar (glucose) levels are greatly affected by what you eat and drink. It is important to use alcohol carefully. A healthy meal plan will help you manage your blood glucose and lower your risk of heart disease. Your health care provider may recommend that you work with a dietitian to make a meal plan that is best for you. This information is not intended to replace advice given to you by your health care provider. Make sure you discuss any questions you have with your health care provider. Document Revised: 11/28/2019 Document Reviewed: 11/28/2019 Elsevier Patient Education  2024 Elsevier Inc.    [name], MD [facility]

## 2022-11-09 NOTE — Assessment & Plan Note (Signed)
Uncontrolled diabetes as described above Continue rosuvastatin 20 mg daily Lipid profile done today

## 2022-11-09 NOTE — Assessment & Plan Note (Signed)
Needs evaluation at wound care center Referral placed today Needs better diabetes control

## 2022-11-09 NOTE — Assessment & Plan Note (Signed)
Sees psychiatrist on a regular basis On several medications handled by their office Depression still active.  Not suicidal. Needs closer follow-up with psychiatrist

## 2022-11-09 NOTE — Assessment & Plan Note (Signed)
Viagra and tadalafil not working Will check testosterone level today Uncontrolled diabetes not helping

## 2022-11-09 NOTE — Patient Instructions (Signed)

## 2022-11-10 ENCOUNTER — Other Ambulatory Visit: Payer: Self-pay | Admitting: Emergency Medicine

## 2022-11-10 DIAGNOSIS — E291 Testicular hypofunction: Secondary | ICD-10-CM

## 2022-11-10 MED ORDER — XYOSTED 75 MG/0.5ML ~~LOC~~ SOAJ
75.0000 mg | SUBCUTANEOUS | 5 refills | Status: DC
Start: 2022-11-10 — End: 2023-01-03

## 2022-11-22 ENCOUNTER — Encounter (HOSPITAL_BASED_OUTPATIENT_CLINIC_OR_DEPARTMENT_OTHER): Payer: BC Managed Care – PPO | Attending: Internal Medicine | Admitting: General Surgery

## 2022-11-22 ENCOUNTER — Ambulatory Visit: Payer: BC Managed Care – PPO | Admitting: Licensed Clinical Social Worker

## 2022-11-22 DIAGNOSIS — G8929 Other chronic pain: Secondary | ICD-10-CM | POA: Diagnosis not present

## 2022-11-22 DIAGNOSIS — I10 Essential (primary) hypertension: Secondary | ICD-10-CM | POA: Diagnosis not present

## 2022-11-22 DIAGNOSIS — Z6837 Body mass index (BMI) 37.0-37.9, adult: Secondary | ICD-10-CM | POA: Diagnosis not present

## 2022-11-22 DIAGNOSIS — Z87891 Personal history of nicotine dependence: Secondary | ICD-10-CM | POA: Diagnosis not present

## 2022-11-22 DIAGNOSIS — Z09 Encounter for follow-up examination after completed treatment for conditions other than malignant neoplasm: Secondary | ICD-10-CM | POA: Insufficient documentation

## 2022-11-22 DIAGNOSIS — L97512 Non-pressure chronic ulcer of other part of right foot with fat layer exposed: Secondary | ICD-10-CM | POA: Diagnosis not present

## 2022-11-22 DIAGNOSIS — E1165 Type 2 diabetes mellitus with hyperglycemia: Secondary | ICD-10-CM | POA: Diagnosis not present

## 2022-11-22 DIAGNOSIS — L97419 Non-pressure chronic ulcer of right heel and midfoot with unspecified severity: Secondary | ICD-10-CM | POA: Insufficient documentation

## 2022-11-22 DIAGNOSIS — E11621 Type 2 diabetes mellitus with foot ulcer: Secondary | ICD-10-CM | POA: Diagnosis not present

## 2022-11-22 NOTE — Progress Notes (Signed)
ESTEVEN, OVERFELT ([id number]) [phone] [id number] Page 1 of 4 Visit Report for [name] Abuse Risk Screen Details Patient Name: Date of Service: [name], [name] [name] 12:45 PM Medical Record Number: [id number] Patient Account Number: [id number] Date of Birth/Sex: Treating RN: 1980-08-07 (42 y.o. [name] Primary Care [name]: [name] Other Clinician: Referring [name]: Treating [name]/Extender: [name] [name] Abuse Risk Screen Items Answer ABUSE RISK SCREEN: Has anyone close to you tried to hurt or harm you recentlyo No Do you feel uncomfortable with anyone in your familyo No Has anyone forced you do things that you didnt want to doo No Electronic Signature(s) Signed: 11/22/2022 4:56:13 PM By: Gerald Schwalbe RN Entered By: [name] on 11/22/2022 12:57:29 -------------------------------------------------------------------------------- Activities of Daily Living Details Patient Name: Date of Service: [name], [name] [name] 12:45 PM Medical Record Number: [id number] Patient Account Number: [id number] Date of Birth/Sex: Treating RN: 11-18-1980 (42 y.o. [name] Primary Care [name]: [name] Other Clinician: Referring [name]: Treating [name]/Extender: [name] [name] Activities of Daily Living Items Answer Activities of Daily Living (Please select one for each item) Drive Automobile Completely Able T Medications ake Completely Able Use T elephone Completely Able Care for Appearance Completely Able Use T oilet Completely Able Bath / Shower Completely Able Dress Self Completely Able Feed Self Completely Able Walk Completely Able Get In / Out Bed Completely Able Housework Completely Able Prepare Meals Completely Able Handle Money Completely Able Shop for Self Completely Able Electronic  Signature(s) Signed: 11/22/2022 4:56:13 PM By: Gerald Schwalbe RN Entered By: [name] on 11/22/2022 12:58:01 Wickliffe, [name] ([id number]) [id number] Page 2 of 4 -------------------------------------------------------------------------------- Education Screening Details Patient Name: Date of Service: [name], [name] [name] 12:45 PM Medical Record Number: [id number] Patient Account Number: [id number] Date of Birth/Sex: Treating RN: [birth year] (42 y.o. [name] Primary Care [name]: [name] Other Clinician: Referring [name]: Treating [name]/Extender: [name] in Treatment: 0 Primary Learner Assessed: Patient Learning Preferences/Education Level/Primary Language Learning Preference: Explanation, Demonstration, Printed Material Highest Education Level: College or Above Preferred Language: English Cognitive Barrier Language Barrier: No Translator Needed: No Memory Deficit: No Emotional Barrier: No Cultural/Religious Beliefs Affecting Medical Care: No Physical Barrier Impaired Vision: No Impaired Hearing: No Decreased Hand dexterity: No Knowledge/Comprehension Knowledge Level: High Comprehension Level: High Ability to understand written instructions: High Ability to understand verbal instructions: High Motivation Anxiety Level: Calm Cooperation: Cooperative Education Importance: Acknowledges Need Interest in Health Problems: Asks Questions Perception: Coherent Willingness to Engage in Self-Management High Activities: Readiness to Engage in Self-Management High Activities: Electronic Signature(s) Signed: 11/22/2022 4:56:13 PM By: Gerald Schwalbe RN Entered By: [name] on 11/22/2022 12:59:09 -------------------------------------------------------------------------------- Fall Risk Assessment Details Patient Name: Date of Service: Gerald [name] 12:45  PM Medical Record Number: [id number] Patient Account Number: [id number] Date of Birth/Sex: Treating RN: [birth year] (42 y.o. [name] Primary Care [name]: [name] Other Clinician: Referring [name]: Treating [name]/Extender: [name] [name] Fall Risk Assessment Items Have you had 2 or more falls in the last 12 monthso 0 No [name], [name] ([id number]) [phone] [id number] Page 3 of 4 Have you had any fall that resulted in injury in the last 12 monthso 0 No FALLS RISK SCREEN History of falling - immediate or within 3 months 0 No Secondary diagnosis (Do you have 2 or more medical diagnoseso) 0 No Ambulatory aid None/bed rest/wheelchair/nurse 0  No Crutches/cane/walker 0 No Furniture 0 No Intravenous therapy Access/Saline/Heparin Lock 0 No Gait/Transferring Normal/ bed rest/ wheelchair 0 No Weak (short steps with or without shuffle, stooped but able to lift head while walking, may seek 0 No support from furniture) Impaired (short steps with shuffle, may have difficulty arising from chair, head down, impaired 0 No balance) Mental Status Oriented to own ability 0 No Electronic Signature(s) Signed: 11/22/2022 4:56:13 PM By: Gerald Schwalbe RN Entered By: [name] on 11/22/2022 12:59:19 -------------------------------------------------------------------------------- Foot Assessment Details Patient Name: Date of Service: Gerald [name] 12:45 PM Medical Record Number: [id number] Patient Account Number: [id number] Date of Birth/Sex: Treating RN: Jun 04, 1980 (42 y.o. [name] Primary Care [name]: [name] Other Clinician: Referring [name]: Treating [name]/Extender: [name] [name] Foot Assessment Items Site Locations + = Sensation present, - = Sensation absent, C = Callus, U = Ulcer R = Redness, W = Warmth, M =  Maceration, PU = Pre-ulcerative lesion F = Fissure, S = Swelling, D = Dryness Assessment Right: Left: Other Deformity: No No Prior Foot Ulcer: No No Prior Amputation: No No Charcot Joint: No No Ambulatory Status: Ambulatory Without Help GaitEUELL, [name] ([id number]) [id number] Page 4 of 4 Electronic Signature(s) Signed: 11/22/2022 4:56:13 PM By: Gerald Schwalbe RN Entered By: [name] on 11/22/2022 13:02:06 -------------------------------------------------------------------------------- Nutrition Risk Screening Details Patient Name: Date of Service: [name], [name] [name] 12:45 PM Medical Record Number: [id number] Patient Account Number: [id number] Date of Birth/Sex: Treating RN: 1980-06-20 (42 y.o. [name] Primary Care Grant Henkes: [name] Other Clinician: Referring Tamla Winkels: Treating Gionni Vaca/Extender: [name] [name] Height (in): 75 Weight (lbs): 300 Body Mass Index (BMI): 37.5 Nutrition Risk Screening Items Score Screening NUTRITION RISK SCREEN: I have an illness or condition that made me change the kind and/or amount of food I eat 0 No I eat fewer than two meals per day 0 No I eat few fruits and vegetables, or milk products 0 No I have three or more drinks of beer, liquor or wine almost every day 0 No I have tooth or mouth problems that make it hard for me to eat 0 No I don't always have enough money to buy the food I need 0 No I eat alone most of the time 0 No I take three or more different prescribed or over-the-counter drugs a day 0 No Without wanting to, I have lost or gained 10 pounds in the last six months 0 No I am not always physically able to shop, cook and/or feed myself 0 No Nutrition Protocols Good Risk Protocol 0 No interventions needed Moderate Risk Protocol High Risk Proctocol Risk Level: Good Risk Score: 0 Electronic  Signature(s) Signed: 11/22/2022 4:56:13 PM By: Gerald Schwalbe RN Entered By: [name] on 11/22/2022 12:59:26

## 2022-11-22 NOTE — Progress Notes (Addendum)
HARUTYUN, MONTEVERDE ([id number]) [id number] Page 1 of 7 Visit Report for 11/22/2022 Chief Complaint Document Details Patient Name: Date of Service: [name], [name] 11/22/2022 12:45 PM Medical Record Number: [id number] Patient Account Number: [id number] Date of Birth/Sex: Treating RN: Mar 28, 1981 (42 y.o. M) Primary Care Provider: [name] Other Clinician: Referring Provider: Treating Provider/Extender: [name] Information Obtained from: Patient Chief Complaint Patients presents for treatment of an open diabetic ulcer Electronic Signature(s) Signed: 11/22/2022 1:25:34 PM By: Duanne Guess MD FACS Previous Signature: 11/22/2022 12:49:52 PM Version By: Duanne Guess MD FACS Entered By: Duanne Guess on 11/22/2022 13:25:34 -------------------------------------------------------------------------------- Debridement Details Patient Name: Date of Service: [name]. 11/22/2022 12:45 PM Medical Record Number: [id number] Patient Account Number: [id number] Date of Birth/Sex: Treating RN: 02-17-1981 (42 y.o. [name] Provider: [name] Other Clinician: Referring Provider: Treating Provider/Extender: [name] Debridement Performed for Assessment: Wound #1 Right,Anterior Foot Performed By: Physician Duanne Guess, MD Debridement Type: Debridement Severity of Tissue Pre Debridement: Fat layer exposed Level of Consciousness (Pre-procedure): Awake and Alert Start Time: 13:15 Pain Control: Lidocaine 4% T opical Solution Percent of Wound Bed Debrided: 100% T Area Debrided (cm): otal 0.71 Tissue and other material debrided: Non-Viable, Slough, Slough Level: Non-Viable Tissue Debridement Description: Selective/Open Wound Instrument: Curette Bleeding: Minimum Hemostasis Achieved: Pressure End Time: 13:17 Procedural  Pain: 0 Post Procedural Pain: 0 Response to Treatment: Procedure was tolerated well Level of Consciousness (Post- Awake and Alert procedure): Post Debridement Measurements of Total Wound Length: (cm) 1 Width: (cm) 0.9 Depth: (cm) 0.1 Volume: (cm) 0.071 Character of Wound/Ulcer Post Debridement: Improved Severity of Tissue Post Debridement: Fat layer exposed [name] ([id number]) [id number] Page 2 of 7 Post Procedure Diagnosis Same as Pre-procedure Notes Scribed for Dr. Lady Gary by J.Scotton Electronic Signature(s) Signed: 11/22/2022 1:40:33 PM By: Duanne Guess MD FACS Signed: 11/22/2022 4:56:13 PM By: Karie Schwalbe RN Entered By: Karie Schwalbe on 11/22/2022 13:21:00 -------------------------------------------------------------------------------- HPI Details Patient Name: Date of Service: [name]. 11/22/2022 12:45 PM Medical Record Number: [id number] Patient Account Number: [id number] Date of Birth/Sex: Treating RN: 1980/06/30 (42 y.o. M) Primary Care Provider: [name] Other Clinician: Referring Provider: Treating Provider/Extender: [name] History of Present Illness HPI Description: ADMISSION 11/22/2022 This is a 42 year old morbidly obese, poorly controlled type II diabetic (last hemoglobin A1c 12.3% on November 09, 2022) who was referred by his primary care provider for further evaluation and management of a wound on his right midfoot. The patient says that it has been present for about 2 months and started out as a blister. Looking at the foot where he is wearing today, it appears that the tongue of the shoe hits in exactly the spot so it may be a footwear-related injury. He says he has not put anything on the wound, not even a Band-Aid. Electronic Signature(s) Signed: 11/22/2022 1:26:27 PM By: Duanne Guess MD FACS Previous Signature: 11/22/2022 12:52:36 PM Version By:  Duanne Guess MD FACS Entered By: Duanne Guess on 11/22/2022 13:26:27 -------------------------------------------------------------------------------- Physical Exam Details Patient Name: Date of Service: [name]. 11/22/2022 12:45 PM Medical Record Number: [id number] Patient Account Number: [id number] Date of Birth/Sex: Treating RN: Oct 22, 1980 (42 y.o. M) Primary Care Provider: [name] Other Clinician: Referring Provider: Treating Provider/Extender: [name] Constitutional . . . . No acute distress. Respiratory Normal work of breathing on room  air. Notes On the dorsal aspect of his right foot, just before the bend in his ankle, there is a small oval wound. It is fairly superficial with slough accumulation on the surface. No malodor or purulent drainage. No other concern for infection. Electronic Signature(s) Signed: 11/22/2022 1:27:26 PM By: Duanne Guess MD FACS Entered By: Duanne Guess on 11/22/2022 13:27:26 [name], [name] ([id number]) [id number] Page 3 of 7 -------------------------------------------------------------------------------- Physician Orders Details Patient Name: Date of Service: [name], [name] 11/22/2022 12:45 PM Medical Record Number: [id number] Patient Account Number: [id number] Date of Birth/Sex: Treating RN: Sep 03, 1980 (42 y.o. 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 - Dr. Lady Gary Room 3 Monday July 22nd at 4  pm Return A Anesthetic (In clinic) Topical Lidocaine 4% applied to wound bed Bathing/ Shower/ Hygiene May shower and wash wound with soap and water. Edema Control - Lymphedema / SCD / Other Bilateral Lower Extremities Elevate legs to the level of the heart or above for 30 minutes daily and/or when sitting for 3-4 times a day throughout the day. Avoid standing for long periods of time. Exercise regularly - As T olerated Moisturize legs daily. Wound Treatment Wound #1 - Foot Wound Laterality: Right, Anterior Prim Dressing: Maxorb Extra Ag+ Alginate Dressing, 2x2 (in/in) (DME) (Generic) 1 x Per Day/30 Days ary Discharge Instructions: Apply to wound bed as instructed Secondary Dressing: Bordered Gauze, 2x2 in (DME) (Generic) 1 x Per Day/30 Days Discharge Instructions: Apply over primary dressing as directed. Electronic Signature(s) Signed: 11/22/2022 1:40:33 PM By: Duanne Guess MD FACS Entered By: Duanne Guess on 11/22/2022 13:27:37 -------------------------------------------------------------------------------- Problem List Details Patient Name: Date of Service: [name]. 11/22/2022 12:45 PM Medical Record Number: [id number] Patient Account Number: [id number] Date of Birth/Sex: Treating RN: 04-04-1981 (42 y.o. M) Primary Care Provider: [name] Other Clinician: Referring Provider: Treating Provider/Extender: [name] Weeks in Treatment: [address] 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 11/22/2022 12:45 PM Medical Record Number: [id number] Patient Account Number: [id number] Date of Birth/Sex: Treating RN: 08-11-1980 (42 y.o. M) Primary Care Provider: [name] Other Clinician: Referring Provider: Treating Provider/Extender: [name] Subjective Chief Complaint Information obtained from Patient Patients presents for treatment of an open diabetic ulcer History of Present Illness (HPI) ADMISSION 11/22/2022 This is a 42 year old morbidly obese, poorly controlled type II diabetic (last hemoglobin A1c 12.3% on November 09, 2022) who was referred by his primary care provider for further evaluation and management of a wound on his right midfoot. The patient says that it has been present for about 2 months and  started out as a blister. Looking at the foot where he is wearing today, it appears that the tongue of the shoe hits in exactly the spot so it may be a footwear-related injury. He says he has not put anything on the wound, not even a Band-Aid. Patient History Information obtained from Patient. Allergies amoxicillin, penicillin, metformin, latex, zolpidem Family History Unknown History. Social History Former smoker - ended on 06/01/2021, Marital Status - Single, Alcohol Use - Rarely, Drug Use - No History, Caffeine Use - Daily. Medical History Cardiovascular Patient has history of Hypertension Endocrine Patient has history of Type II Diabetes Patient is treated with Insulin, Oral Agents. Blood sugar is not tested. Hospitalization/Surgery History - Back Surgery (2017). JULUIS, [name] ([id number])  [id number] Page 5 of 7 Medical A Surgical History Notes nd Musculoskeletal Chronic Back Pain Psychiatric Chronic Depression PTSD Objective Constitutional No acute distress. Vitals Time Taken: 12:45 PM, Height: 75 in, Weight: 300 lbs, BMI: 37.5, Temperature: 97.8 F, Pulse: 96 bpm, Respiratory Rate: 16 breaths/min, Blood Pressure: 126/72 mmHg. Respiratory Normal work of breathing on room air. General Notes: On the dorsal aspect of his right foot, just before the bend in his ankle, there is a small oval wound. It is fairly superficial with slough accumulation on the surface. No malodor or purulent drainage. No other concern for infection. Integumentary (Hair, Skin) Wound #1 status is Open. Original cause of wound was Blister. The date acquired was: 09/22/2022. The wound is located on the Right,Anterior Foot. The wound measures 1cm length x 0.9cm width x 0.1cm depth; 0.707cm^2 area and 0.071cm^3 volume. There is Fat Layer (Subcutaneous Tissue) exposed. There is no tunneling or undermining noted. There is a medium amount of serosanguineous drainage noted. There is large (67-100%) red, pink granulation within the wound bed. There is a small (1-33%) amount of necrotic tissue within the wound bed including Eschar and Adherent Slough. The periwound skin appearance had no abnormalities noted for moisture. The periwound skin appearance had no abnormalities noted for color. The periwound skin appearance exhibited: Scarring. Periwound temperature was noted as No Abnormality. 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  There was a Selective/Open Wound Non-Viable Tissue Debridement with a total area of 0.71 sq cm performed by Duanne Guess, MD. With the following instrument(s): Curette to remove Non-Viable tissue/material. Material removed includes [facility] after achieving pain control using Lidocaine 4% T opical Solution. No specimens were taken.A Minimum amount of bleeding was controlled with Pressure. The procedure was tolerated well with a pain level of 0 throughout and a pain level of 0 following the procedure. Post Debridement Measurements: 1cm length x 0.9cm width x 0.1cm depth; 0.071cm^3 volume. Character of Wound/Ulcer Post Debridement is improved. Severity of Tissue Post Debridement is: Fat layer exposed. Post procedure Diagnosis Wound #1: Same as Pre-Procedure General Notes: Scribed for Dr. Lady Gary by J.Scotton. Plan Follow-up Appointments: Return Appointment in 1 week. - Dr. Lady Gary Room 3 Monday July 22nd at 4 pm Anesthetic: (In clinic) Topical Lidocaine 4% applied to wound bed Bathing/ Shower/ Hygiene: May shower and wash wound with soap and water. Edema Control - Lymphedema / SCD / Other: Elevate legs to the level of the heart or above for  30 minutes daily and/or when sitting for 3-4 times a day throughout the day. Avoid standing for long periods of time. Exercise regularly - As [name], [name] ([id number]) [id number] Page 6 of 7 Moisturize legs daily. WOUND #1: - Foot Wound Laterality: Right, Anterior Prim Dressing: Maxorb Extra Ag+ Alginate Dressing, 2x2 (in/in) (DME) (Generic) 1 x Per Day/30 Days ary Discharge Instructions: Apply to wound bed as instructed Secondary Dressing: Bordered Gauze, 2x2 in (DME) (Generic) 1 x Per Day/30 Days Discharge Instructions: Apply over primary dressing as directed. 11/22/2022: This is a 42 year old poorly controlled diabetic with what appears to be a foot where-related injury on the dorsum of his foot. On the dorsal  aspect of his right foot, just before the bend in his ankle, there is a small oval wound. It is fairly superficial with slough accumulation on the surface. No malodor or purulent drainage. No other concern for infection. I used a curette to debride slough from the wound. Will apply silver alginate and a foam border dressing. He will follow-up in 1 week. Electronic Signature(s) Signed: 11/22/2022 1:28:32 PM By: Duanne Guess MD FACS Entered By: Duanne Guess on 11/22/2022 13:28:32 -------------------------------------------------------------------------------- HxROS Details Patient Name: Date of Service: [name]. 11/22/2022 12:45 PM Medical Record Number: [id number] Patient Account Number: [id number] Date of Birth/Sex: Treating RN: 03/16/1981 (42 y.o. [name] Provider: [name] Other Clinician: Referring Provider: Treating Provider/Extender: [name] Information Obtained From Patient Cardiovascular Medical History: Positive for: Hypertension Endocrine Medical History: Positive for: Type II Diabetes Time with diabetes: 17 years Treated with: Insulin, Oral agents Blood sugar tested every day: No Musculoskeletal Medical History: Past Medical History Notes: Chronic Back Pain Psychiatric Medical History: Past Medical History Notes: Chronic Depression PTSD Immunizations Pneumococcal Vaccine: Received Pneumococcal Vaccination: No Implantable Devices None Hospitalization / Surgery History Type of Hospitalization/Surgery [name], [name] ([id number]) [id number] Page 7 of 7 Back Surgery (2017) Family and Social History Unknown History: Yes; Former smoker - ended on 06/01/2021; Marital Status - Single; Alcohol Use: Rarely; Drug Use: No History; Caffeine Use: Daily; Financial Concerns: No; Food, Clothing or Shelter Needs: No; Support System Lacking: No;  Transportation Concerns: No Electronic Signature(s) Signed: 11/22/2022 1:40:33 PM By: Duanne Guess MD FACS Signed: 11/22/2022 4:56:13 PM By: Karie Schwalbe RN Entered By: Karie Schwalbe on 11/22/2022 12:57:16 -------------------------------------------------------------------------------- SuperBill Details Patient Name: Date of Service: [name]. 11/22/2022 Medical Record Number: [id number] Patient Account Number: [id number] Date of Birth/Sex: Treating RN: 17-Apr-1981 (42 y.o. 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 Modifier: Quantity: 1 Physician Procedures : CPT4 Code Description Modifier [id number] - WC PHYS LEVEL 4 - NEW PT 25 ICD-10 Diagnosis Description 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

## 2022-11-23 ENCOUNTER — Other Ambulatory Visit (HOSPITAL_COMMUNITY): Payer: Self-pay

## 2022-11-23 ENCOUNTER — Telehealth: Payer: Self-pay

## 2022-11-23 ENCOUNTER — Telehealth: Payer: Self-pay | Admitting: Emergency Medicine

## 2022-11-23 DIAGNOSIS — E291 Testicular hypofunction: Secondary | ICD-10-CM

## 2022-11-23 NOTE — Telephone Encounter (Signed)
Forwarding to Rx Prior team for PA.Marland KitchenRaechel Chute

## 2022-11-23 NOTE — Progress Notes (Signed)
TAEVON, ASCHOFF ([id number]) [id number] Page 1 of 8 Visit Report for 11/22/2022 Allergy List Details Patient Name: Date of Service: [name], [name] 11/22/2022 12:45 PM Medical Record Number: [id number] Patient Account Number: [id number] Date of Birth/Sex: Treating RN: 1980-05-26 (42 y.o. [name] Smita Lesh: [name] Other Clinician: Referring Zyria Fiscus: Treating Antonea Gaut/Extender: [name] Allergies Active Allergies amoxicillin penicillin metformin latex zolpidem Allergy Notes Electronic Signature(s) Signed: 11/22/2022 4:56:13 PM By: Karie Schwalbe RN Entered By: Karie Schwalbe on 11/22/2022 12:52:39 -------------------------------------------------------------------------------- Arrival Information Details Patient Name: Date of Service: [name]. 11/22/2022 12:45 PM Medical Record Number: [id number] Patient Account Number: [id number] Date of Birth/Sex: Treating RN: 1980-06-07 (42 y.o. [name] Erynn Vaca: [name] Other Clinician: Referring Lisaann Atha: Treating Joshua Zeringue/Extender: [name] Visit Information Patient Arrived: Ambulatory Arrival Time: 12:47 Accompanied By: self Transfer Assistance: None Patient Identification Verified: Yes Electronic Signature(s) Signed: 11/22/2022 4:56:13 PM By: Karie Schwalbe RN Entered By: Karie Schwalbe on 11/22/2022 12:48:48 [name], [name] ([id number]) [id number] Page 2 of 8 -------------------------------------------------------------------------------- Clinic Level of Care Assessment Details Patient Name: Date of Service: [name], [name] 11/22/2022 12:45 PM Medical Record Number: [id number] Patient Account Number: [id number] Date of Birth/Sex: Treating RN: 01/25/1981 (42 y.o. [name] Ricketta Colantonio:  [name] Other Clinician: Referring Kalisha Keadle: Treating Aarica Wax/Extender: [name] Clinic Level of Care Assessment Items TOOL 1 Quantity Score X- 1 0 Use when EandM and Procedure is performed on INITIAL visit ASSESSMENTS - Nursing Assessment / Reassessment X- 1 20 General Physical Exam (combine w/ comprehensive assessment (listed just below) when performed on new pt. evals) X- 1 25 Comprehensive Assessment (HX, ROS, Risk Assessments, Wounds Hx, etc.) ASSESSMENTS - Wound and Skin Assessment / Reassessment X- 1 10 Dermatologic / Skin Assessment (not related to wound area) ASSESSMENTS - Ostomy and/or Continence Assessment and Care []  - 0 Incontinence Assessment and Management []  - 0 Ostomy Care Assessment and Management (repouching, etc.) PROCESS - Coordination of Care X - Simple Patient / Family Education for ongoing care 1 15 []  - 0 Complex (extensive) Patient / Family Education for ongoing care X- 1 10 Staff obtains Chiropractor, Records, T Results / Process Orders est X- 1 10 Staff telephones HHA, Nursing Homes / Clarify orders / etc []  - 0 Routine Transfer to another Facility (non-emergent condition) []  - 0 Routine Hospital Admission (non-emergent condition) []  - 0 New Admissions / Manufacturing engineer / Ordering NPWT Apligraf, etc. , []  - 0 Emergency Hospital Admission (emergent condition) PROCESS - Special Needs []  - 0 Pediatric / Minor Patient Management []  - 0 Isolation Patient Management []  - 0 Hearing / Language / Visual special needs []  - 0 Assessment of Community assistance (transportation, D/C planning, etc.) []  - 0 Additional assistance / Altered mentation []  - 0 Support Surface(s) Assessment (bed, cushion, seat, etc.) INTERVENTIONS - Miscellaneous []  - 0 External ear exam []  - 0 Patient Transfer (multiple staff / Nurse, adult / Similar devices) []  - 0 Simple Staple / Suture removal (25 or  less) []  - 0 Complex Staple / Suture removal (26 or more) []  - 0 Hypo/Hyperglycemic Management (do not check if billed separately) X- 1 15 Ankle / Brachial Index (ABI) - do not check if billed separately Has the patient been seen at the hospital within the last three years: Yes Total Score: 105 Level Of Care: New/Established - Level  3 Electronic Signature(s) Signed: 11/22/2022 4:56:13 PM By: Karie Schwalbe RN [name], [name] [phone] By: Karie Schwalbe RN [phone].pdf Page 3 of 8 Signed: 11/22/2022 4:56:13 Entered By: Karie Schwalbe on 11/22/2022 14:01:25 -------------------------------------------------------------------------------- Encounter Discharge Information Details Patient Name: Date of Service: [name], [name] 11/22/2022 12:45 PM Medical Record Number: [id number] Patient Account Number: [id number] Date of Birth/Sex: Treating RN: 1980-09-05 (42 y.o. [name] Stark Aguinaga: [name] Other Clinician: Referring Danely Bayliss: Treating Stevee Valenta/Extender: [name] Encounter Discharge Information Items Post Procedure Vitals Discharge Condition: Stable Temperature (F): 97.8 Ambulatory Status: Ambulatory Pulse (bpm): 96 Discharge Destination: Home Respiratory Rate (breaths/min): 16 Transportation: Private Auto Blood Pressure (mmHg): 126/72 Accompanied By: self Schedule Follow-up Appointment: Yes Clinical Summary of Care: Patient Declined Electronic Signature(s) Signed: 11/22/2022 4:56:13 PM By: Karie Schwalbe RN Entered By: Karie Schwalbe on 11/22/2022 14:02:43 -------------------------------------------------------------------------------- Lower Extremity Assessment Details Patient Name: Date of Service: [name], [name] 11/22/2022 12:45 PM Medical Record Number: [id number] Patient Account Number: [id number] Date of Birth/Sex: Treating RN: 05-Nov-1980 (42 y.o. [name] Alyn Jurney: [name] Other Clinician: Referring Lucky Alverson: Treating Zyrion Coey/Extender: [name] Edema Assessment Assessed: [Left: No] [Right: No] Edema: [Left: Yes] [Right: Yes] Calf Left: Right: Point of Measurement: 38 cm From Medial Instep 49 cm Ankle Left: Right: Point of Measurement: 11 cm From Medial Instep 27 cm Knee To Floor Left: Right: From Medial Instep 48 cm Vascular Assessment Pulses: Dorsalis Pedis Palpable: [Right:Yes] Extremity colors, hair growth, and conditions: KEHINDE, BOWDISH ([id number]) [Right:[id number] Page 4 of 8] Extremity Color: [Left:Normal] [Right:Normal] Hair Growth on Extremity: [Left:No] [Right:No] Temperature of Extremity: [Left:Warm] [Right:Warm] Capillary Refill: [Right:< 3 seconds] Dependent Rubor: [Right:No] Blanched when Elevated: [Right:No] Lipodermatosclerosis: [Right:No] Blood Pressure: Brachial: [Right:126] Ankle: [Right:Dorsalis Pedis: 130 1.03] Toe Nail Assessment Left: Right: Thick: Yes Discolored: No Deformed: No Improper Length and Hygiene: No Electronic Signature(s) Signed: 11/22/2022 4:56:13 PM By: Karie Schwalbe RN Entered By: Karie Schwalbe on 11/22/2022 13:13:51 -------------------------------------------------------------------------------- Multi Wound Chart Details Patient Name: Date of Service: [name]. 11/22/2022 12:45 PM Medical Record Number: [id number] Patient Account Number: [id number] Date of Birth/Sex: Treating RN: 1980-07-04 (42 y.o. M) Primary Care Celes Dedic: [name] Other Clinician: Referring Phyllistine Domingos: Treating Zimri Brennen/Extender: [name] Vital Signs Height(in): 75 Pulse(bpm): 96 Weight(lbs): 300 Blood Pressure(mmHg): 126/72 Body Mass Index(BMI): 37.5 Temperature(F): 97.8 Respiratory Rate(breaths/min): 16 [1:Photos:]  [N/A:N/A] Right, Anterior Foot N/A N/A Wound Location: Blister N/A N/A Wounding Event: Diabetic Wound/Ulcer of the Lower N/A N/A Primary Etiology: Extremity Hypertension, Type II Diabetes N/A N/A Comorbid History: 09/22/2022 N/A N/A Date Acquired: 0 N/A N/A Weeks of Treatment: Open N/A N/A Wound Status: No N/A N/A Wound Recurrence: 1x0.9x0.1 N/A N/A Measurements L x W x D (cm) 0.707 N/A N/A A (cm) : rea 0.071 N/A N/A Volume (cm) : Grade 1 N/A N/A Classification: Medium N/A N/A Exudate A mount: Serosanguineous N/A N/A Exudate Type: red, brown N/A N/A Exudate Color: Large (67-100%) N/A N/A Granulation A mountAEDYN, KEMPFER ([id number]) [id number] Page 5 of 8 Red, Pink N/A N/A Granulation Quality: Small (1-33%) N/A N/A Necrotic Amount: Eschar, Adherent Slough N/A N/A Necrotic Tissue: Fat Layer (Subcutaneous Tissue): Yes N/A N/A Exposed Structures: Fascia: No Tendon: No Muscle: No Joint: No Bone: No Small (1-33%) N/A N/A Epithelialization: Debridement - Selective/Open Wound N/A N/A Debridement: Lidocaine 4% Topical Solution N/A N/A Pain Control: Slough N/A N/A Tissue Debrided:  Non-Viable Tissue N/A N/A Level: 0.71 N/A N/A Debridement A (sq cm): rea Curette N/A N/A Instrument: Minimum N/A N/A Bleeding: Pressure N/A N/A Hemostasis A chieved: 0 N/A N/A Procedural Pain: 0 N/A N/A Post Procedural Pain: Debridement Treatment Response: Procedure was tolerated well N/A N/A Post Debridement Measurements L x 1x0.9x0.1 N/A N/A W x D (cm) 0.071 N/A N/A Post Debridement Volume: (cm) Scarring: Yes N/A N/A Periwound Skin Texture: No Abnormalities Noted N/A N/A Periwound Skin Moisture: No Abnormalities Noted N/A N/A Periwound Skin Color: No Abnormality N/A N/A Temperature: Debridement N/A N/A Procedures Performed: Treatment Notes Electronic Signature(s) Signed: 11/22/2022 1:25:27 PM By: Duanne Guess MD FACS Entered  By: Duanne Guess on 11/22/2022 13:25:26 -------------------------------------------------------------------------------- Multi-Disciplinary Care Plan Details Patient Name: Date of Service: [name]. 11/22/2022 12:45 PM Medical Record Number: [id number] Patient Account Number: [id number] Date of Birth/Sex: Treating RN: 1980-10-26 (42 y.o. [name] Rayland Hamed: [name] Other Clinician: Referring Jashawna Reever: Treating Abisai Coble/Extender: [name] Active Inactive Wound/Skin Impairment Nursing Diagnoses: Impaired tissue integrity Goals: Patient/caregiver will verbalize understanding of skin care regimen Date Initiated: 11/22/2022 Target Resolution Date: 02/07/2023 Goal Status: Active Interventions: Assess ulceration(s) every visit Treatment Activities: Skin care regimen initiated : 11/22/2022 Notes: Electronic Signature(s) Signed: 11/22/2022 4:56:13 PM By: Karie Schwalbe RN [name], [name] ([id number]) [id number] Page 6 of 8 Entered By: Karie Schwalbe on 11/22/2022 14:00:11 -------------------------------------------------------------------------------- Pain Assessment Details Patient Name: Date of Service: [name], [name]. 11/22/2022 12:45 PM Medical Record Number: [id number] Patient Account Number: [id number] Date of Birth/Sex: Treating RN: 1980/06/21 (42 y.o. [name] Rhealyn Cullen: [name] Other Clinician: Referring Kamarii Carton: Treating Asharia Lotter/Extender: [name] Active Problems Location of Pain Severity and Description of Pain Patient Has Paino No Site Locations Pain Management and Medication Current Pain Management: Electronic Signature(s) Signed: 11/22/2022 4:56:13 PM By: Karie Schwalbe RN Entered By: Karie Schwalbe on 11/22/2022  13:06:56 -------------------------------------------------------------------------------- Patient/Caregiver Education Details Patient Name: Date of Service: [name] 7/15/2024andnbsp12:45 PM Medical Record Number: [id number] Patient Account Number: [id number] Date of Birth/Gender: Treating RN: [birth year] (42 y.o. [name] Physician: [name] Other Clinician: Referring Physician: Treating Physician/Extender: [name] Education Assessment Education Provided To: Patient Education Topics Provided Wound/Skin ImpairmentTHURL, [name] ([id number]) [id number] Page 7 of 8 Methods: Explain/Verbal Responses: Return demonstration correctly Electronic Signature(s) Signed: 11/22/2022 4:56:13 PM By: Karie Schwalbe RN Entered By: Karie Schwalbe on 11/22/2022 14:00:22 -------------------------------------------------------------------------------- Wound Assessment Details Patient Name: Date of Service: [name], [name] 11/22/2022 12:45 PM Medical Record Number: [id number] Patient Account Number: [id number] Date of Birth/Sex: Treating RN: 1981/04/06 (42 y.o. [name] Rhilynn Preyer: [name] Other Clinician: Referring Robel Wuertz: Treating Jaysin Gayler/Extender: [name] Wound Status Wound Number: 1 Primary Etiology: Diabetic Wound/Ulcer of the Lower Extremity Wound Location: Right, Anterior Foot Wound Status: Open Wounding Event: Blister Comorbid History: Hypertension, Type II Diabetes Date Acquired: 09/22/2022 Weeks Of Treatment: 0 Clustered Wound: No Photos Wound Measurements Length: (cm) 1 Width: (cm) 0.9 Depth: (cm) 0.1 Area: (cm) 0.707 Volume: (cm) 0.071 % Reduction in Area: % Reduction in Volume: Epithelialization: Small (1-33%) Tunneling: No Undermining: No Wound Description Classification:  Grade 1 Exudate Amount: Medium Exudate Type: Serosanguineous Exudate Color: red, brown Foul Odor After Cleansing: No Slough/Fibrino Yes Wound Bed Granulation Amount: Large (67-100%) Exposed Structure Granulation Quality: Red, Pink Fascia Exposed: No Necrotic  Amount: Small (1-33%) Fat Layer (Subcutaneous Tissue) Exposed: Yes Necrotic Quality: Eschar, Adherent Slough Tendon Exposed: No Muscle Exposed: No Joint Exposed: No Bone Exposed: No Periwound Skin Texture Texture Color No Abnormalities Noted: No No Abnormalities Noted: Yes Scarring: Yes Temperature / Pain [name], [name] ([id number]) [id number] Page 8 of 8 Temperature: No Abnormality Moisture No Abnormalities Noted: Yes Treatment Notes Wound #1 (Foot) Wound Laterality: Right, Anterior Cleanser Peri-Wound Care Topical Primary Dressing Maxorb Extra Ag+ Alginate Dressing, 2x2 (in/in) Discharge Instruction: Apply to wound bed as instructed Secondary Dressing Bordered Gauze, 2x2 in Discharge Instruction: Apply over primary dressing as directed. Secured With Compression Wrap Compression Stockings Facilities manager) Signed: 11/22/2022 4:56:13 PM By: Karie Schwalbe RN Signed: 11/23/2022 4:26:02 PM By: Dayton Scrape Entered By: Dayton Scrape on 11/22/2022 13:06:40 -------------------------------------------------------------------------------- Vitals Details Patient Name: Date of Service: [name]. 11/22/2022 12:45 PM Medical Record Number: [id number] Patient Account Number: [id number] Date of Birth/Sex: Treating RN: January 25, 1981 (42 y.o. [name] Don Tiu: [name] Other Clinician: Referring Kimisha Eunice: Treating Ariyel Jeangilles/Extender: [name] Vital Signs Time Taken: 12:45 Temperature (F): 97.8 Height (in): 75 Pulse (bpm): 96 Weight (lbs): 300 Respiratory Rate (breaths/min): 16 Body Mass Index  (BMI): 37.5 Blood Pressure (mmHg): 126/72 Reference Range: 80 - 120 mg / dl Electronic Signature(s) Signed: 11/22/2022 4:56:13 PM By: Karie Schwalbe RN Entered By: Karie Schwalbe on 11/22/2022 13:11:02

## 2022-11-23 NOTE — Telephone Encounter (Signed)
Kai from [employer] called and said they faxed over a prior aithorization request on 11/19/2022. He also re-faxed it today 11/23/2022. Best callback is [phone].

## 2022-11-23 NOTE — Telephone Encounter (Signed)
Pharmacy Patient Advocate Encounter   Received notification from Pt Calls Messages that prior authorization for [name] is required/requested.   Insurance verification completed.   The patient is insured through [facility] .   Per test claim: PA submitted to [facility] [place] via CoverMyMeds Key/confirmation #/EOC [id number] Status is pending

## 2022-11-24 NOTE — Telephone Encounter (Signed)
Noted../lmb 

## 2022-11-26 NOTE — Telephone Encounter (Signed)
Pharmacy Patient Advocate Encounter   Received notification from Pt Calls Messages that prior authorization for Xyosted 75mg /0.27ml is required/requested.   Insurance verification completed.   The patient is insured through  [employer]  .   Per test claim: PA submitted to [facility] via Fax Key/confirmation #/EOC [id number] Status is pending  Phone number: [phone] Fax number: [phone]

## 2022-11-26 NOTE — Telephone Encounter (Signed)
Pharmacy Patient Advocate Encounter  Received notification from [facility] that Prior Authorization for Xyosted 75mg /0.3ml has been You do not meet the requirements of your plan. Your plan covers this drug when you have had two tests in the morning that showed low testosterone levels..  PA #/Case ID/Reference #: [id number]

## 2022-11-29 ENCOUNTER — Encounter (HOSPITAL_BASED_OUTPATIENT_CLINIC_OR_DEPARTMENT_OTHER): Payer: BC Managed Care – PPO | Admitting: General Surgery

## 2022-11-29 DIAGNOSIS — Z87891 Personal history of nicotine dependence: Secondary | ICD-10-CM | POA: Diagnosis not present

## 2022-11-29 DIAGNOSIS — E1165 Type 2 diabetes mellitus with hyperglycemia: Secondary | ICD-10-CM | POA: Diagnosis not present

## 2022-11-29 DIAGNOSIS — L97419 Non-pressure chronic ulcer of right heel and midfoot with unspecified severity: Secondary | ICD-10-CM | POA: Diagnosis not present

## 2022-11-29 DIAGNOSIS — L97512 Non-pressure chronic ulcer of other part of right foot with fat layer exposed: Secondary | ICD-10-CM | POA: Diagnosis not present

## 2022-11-29 DIAGNOSIS — I10 Essential (primary) hypertension: Secondary | ICD-10-CM | POA: Diagnosis not present

## 2022-11-29 DIAGNOSIS — G8929 Other chronic pain: Secondary | ICD-10-CM | POA: Diagnosis not present

## 2022-11-29 DIAGNOSIS — Z09 Encounter for follow-up examination after completed treatment for conditions other than malignant neoplasm: Secondary | ICD-10-CM | POA: Diagnosis not present

## 2022-11-29 DIAGNOSIS — E11621 Type 2 diabetes mellitus with foot ulcer: Secondary | ICD-10-CM | POA: Diagnosis not present

## 2022-11-29 DIAGNOSIS — Z6837 Body mass index (BMI) 37.0-37.9, adult: Secondary | ICD-10-CM | POA: Diagnosis not present

## 2022-11-29 NOTE — Progress Notes (Signed)
BEVERLEY, SHERRARD ([id number]) [id number] Page 1 of 7 Visit Report for 11/29/2022 Chief Complaint Document Details Patient Name: Date of Service: [name], [name] 11/29/2022 4:00 PM Medical Record Number: [id number] Patient Account Number: [id number] Date of Birth/Sex: Treating RN: 11/27/1980 (42 y.o. M) Primary Care Provider: [name] Other Clinician: Referring Provider: Treating Provider/Extender: [name] Weeks in Treatment: 1 Information Obtained from: Patient Chief Complaint Patients presents for treatment of an open diabetic ulcer Electronic Signature(s) Signed: 11/29/2022 4:27:11 PM By: Duanne Guess MD FACS Entered By: Duanne Guess on 11/29/2022 16:27:11 -------------------------------------------------------------------------------- Debridement Details Patient Name: Date of Service: Gerald Moras. 11/29/2022 4:00 PM Medical Record Number: [id number] Patient Account Number: [id number] Date of Birth/Sex: Treating RN: 04-22-1981 (42 y.o. Cline Cools Primary Care Provider: [name] Other Clinician: Referring Provider: Treating Provider/Extender: [name] Weeks in Treatment: 1 Debridement Performed for Assessment: Wound #1 Right,Anterior Foot Performed By: Physician Duanne Guess, MD Debridement Type: Debridement Severity of Tissue Pre Debridement: Fat layer exposed Level of Consciousness (Pre-procedure): Awake and Alert Pre-procedure Verification/Time Out Yes - 16:15 Taken: Start Time: 16:18 Pain Control: Lidocaine 4% T opical Solution Percent of Wound Bed Debrided: 100% T Area Debrided (cm): otal 0.28 Tissue and other material debrided: Non-Viable, Slough, Slough Level: Non-Viable Tissue Debridement Description: Selective/Open Wound Instrument: Curette Bleeding: Minimum Hemostasis Achieved: Pressure Response to Treatment: Procedure was tolerated well Level  of Consciousness (Post- Awake and Alert procedure): Post Debridement Measurements of Total Wound Length: (cm) 0.6 Width: (cm) 0.6 Depth: (cm) 0.1 Volume: (cm) 0.028 Character of Wound/Ulcer Post Debridement: Improved Severity of Tissue Post Debridement: Fat layer exposed Post Procedure Diagnosis [name], [name] ([id number]) [id number] Page 2 of 7 Same as Pre-procedure Notes Scribed for Dr Lady Gary by Redmond Pulling, RN Electronic Signature(s) Signed: 11/29/2022 4:30:15 PM By: Duanne Guess MD FACS Signed: 11/29/2022 4:30:58 PM By: Redmond Pulling RN, BSN Entered By: Redmond Pulling on 11/29/2022 16:19:13 -------------------------------------------------------------------------------- HPI Details Patient Name: Date of Service: Gerald Moras. 11/29/2022 4:00 PM Medical Record Number: [id number] Patient Account Number: [id number] Date of Birth/Sex: Treating RN: 1980-11-06 (42 y.o. M) Primary Care Provider: [name] Other Clinician: Referring Provider: Treating Provider/Extender: [name] Weeks in Treatment: 1 History of Present Illness HPI Description: ADMISSION 11/22/2022 This is a 42 year old morbidly obese, poorly controlled type II diabetic (last hemoglobin A1c 12.3% on November 09, 2022) who was referred by his primary care provider for further evaluation and management of a wound on his right midfoot. The patient says that it has been present for about 2 months and started out as a blister. Looking at the foot where he is wearing today, it appears that the tongue of the shoe hits in exactly the spot so it may be a footwear-related injury. He says he has not put anything on the wound, not even a Band-Aid. 11/29/2022: The wound on his anterior ankle is about half the size that it was last week. There is minimal slough accumulation on the surface. Electronic Signature(s) Signed: 11/29/2022 4:27:46 PM By: Duanne Guess MD  FACS Entered By: Duanne Guess on 11/29/2022 16:27:46 -------------------------------------------------------------------------------- Physical Exam Details Patient Name: Date of Service: Gerald Moras. 11/29/2022 4:00 PM Medical Record Number: [id number] Patient Account Number: [id number] Date of Birth/Sex: Treating RN: March 17, 1981 (42 y.o. M) Primary Care Provider: [name] Other Clinician: Referring Provider: Treating Provider/Extender: [name] Weeks in Treatment: 1 Constitutional .Tachycardic, asymptomatic. . . no acute distress. Respiratory Normal work of  breathing on room air. Notes 11/29/2022: The wound on his anterior ankle is about half the size that it was last week. There is minimal slough accumulation on the surface. Electronic Signature(s) Signed: 11/29/2022 4:29:08 PM By: Duanne Guess MD FACS Entered By: Duanne Guess on 11/29/2022 16:29:08 [name], [name] ([id number]) [id number] Page 3 of 7 -------------------------------------------------------------------------------- Physician Orders Details Patient Name: Date of Service: [name], [name] 11/29/2022 4:00 PM Medical Record Number: [id number] Patient Account Number: [id number] Date of Birth/Sex: Treating RN: 02-05-1981 (42 y.o. 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 - Dr. Lady Gary Room 3 Return A Anesthetic (In clinic) Topical Lidocaine 4% applied to wound bed Bathing/ Shower/ Hygiene May shower and wash  wound with soap and water. Edema Control - Lymphedema / SCD / Other Bilateral Lower Extremities Elevate legs to the level of the heart or above for 30 minutes daily and/or when sitting for 3-4 times a day throughout the day. Avoid standing for long periods of time. Exercise regularly - As T olerated Moisturize legs daily. Wound Treatment Wound #1 - Foot Wound Laterality: Right, Anterior Prim Dressing: Maxorb Extra Ag+ Alginate Dressing, 2x2 (in/in) (Generic) 1 x Per Day/30 Days ary Discharge Instructions: Apply to wound bed as instructed Secondary Dressing: Bordered Gauze, 2x2 in (Generic) 1 x Per Day/30 Days Discharge Instructions: Apply over primary dressing as directed. Patient Medications llergies: amoxicillin, penicillin, metformin, latex, zolpidem A Notifications Medication Indication Start End 11/29/2022 lidocaine DOSE topical 4 % cream - cream topical once daily Electronic Signature(s) Signed: 11/29/2022 4:30:15 PM By: Duanne Guess MD FACS Entered By: Duanne Guess on 11/29/2022 16:29:22 -------------------------------------------------------------------------------- Problem List Details Patient Name: Date of Service: Gerald Moras. 11/29/2022 4:00 PM [name] ([id number]) [id number] Page 4 of 7 Medical Record Number: [id number] Patient Account Number: [id number] Date of Birth/Sex: Treating RN: June 04, 1980 (42 y.o. 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 11/29/2022 4:00 PM Medical Record Number: [id number] Patient Account Number: [id number] Date of Birth/Sex: Treating RN: December 08, 1980 (42 y.o. M) Primary Care Provider: [name] Other Clinician: Referring Provider: Treating Provider/Extender: [name] Weeks in Treatment: 1 Subjective Chief Complaint Information obtained from Patient Patients presents for treatment of an open diabetic ulcer History of Present Illness (HPI) ADMISSION 11/22/2022 This is a 42 year old morbidly obese, poorly controlled type II diabetic (last hemoglobin A1c 12.3% on November 09, 2022) who was referred by his primary care provider for further evaluation and management of a wound on his right midfoot. The patient says that it has been present for about 2 months and started out as a blister. Looking at the foot where  he is wearing today, it appears that the tongue of the shoe hits in exactly the spot so it may be a footwear-related injury. He says he has not put anything on the wound, not even a Band-Aid. 11/29/2022: The wound on his anterior ankle is about half the size that it was last week. There is minimal slough accumulation on the surface. Patient History Information obtained from Patient. Family History Unknown History. Social History Former smoker - ended on 06/01/2021, Marital Status - Single, Alcohol Use - Rarely, Drug Use - No History, Caffeine Use - Daily. Medical History Cardiovascular [name], [name] ([id number]) [id number] Page 5 of 7 Patient has history of Hypertension Endocrine Patient has history of Type II Diabetes Hospitalization/Surgery History - Back Surgery  (2017). Medical A Surgical History Notes nd Musculoskeletal Chronic Back Pain Psychiatric Chronic Depression PTSD Objective Constitutional Tachycardic, asymptomatic. no acute distress. Vitals Time Taken: 4:09 PM, Height: 75 in, Weight: 300 lbs, BMI: 37.5, Temperature: 97.7 F, Pulse: 116 bpm, Respiratory Rate: 16 breaths/min, Blood Pressure: 109/74 mmHg. Respiratory Normal work of breathing on room air. General Notes: 11/29/2022: The wound on his anterior ankle is about half the size that it was last week. There is minimal slough accumulation on the surface. Integumentary (Hair, Skin) Wound #1 status is Open. Original cause of wound was Blister. The date acquired was: 09/22/2022. The wound has been in treatment 1 weeks. The wound is located on the Right,Anterior Foot. The wound measures 0.6cm length x 0.6cm width x 0.1cm depth; 0.283cm^2 area and 0.028cm^3 volume. There is Fat Layer (Subcutaneous Tissue) exposed. There is no tunneling or undermining noted. There is a medium amount of serosanguineous drainage noted. There is large (67-100%) red, pink granulation within the wound bed. There is a small (1-33%) amount of necrotic tissue within the wound bed including Adherent Slough. The periwound skin appearance had no abnormalities noted for moisture. The periwound skin appearance had no abnormalities noted for color. The periwound skin appearance exhibited: Scarring. Periwound temperature was noted as No Abnormality. 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 There was a Selective/Open Wound Non-Viable Tissue Debridement with a total area of 0.28 sq  cm performed by Duanne Guess, MD. With the following instrument(s): Curette to remove Non-Viable tissue/material. Material removed includes [facility] after achieving pain control using Lidocaine 4% T opical Solution. No specimens were taken. A time out was conducted at 16:15, prior to the start of the procedure. A Minimum amount of bleeding was controlled with Pressure. The procedure was tolerated well. Post Debridement Measurements: 0.6cm length x 0.6cm width x 0.1cm depth; 0.028cm^3 volume. Character of Wound/Ulcer Post Debridement is improved. Severity of Tissue Post Debridement is: Fat layer exposed. Post procedure Diagnosis Wound #1: Same as Pre-Procedure General Notes: Scribed for Dr Lady Gary by Redmond Pulling, RN. Plan Follow-up Appointments: Return Appointment in 1 week. - Dr. Lady Gary Room 3 Anesthetic: (In clinic) Topical Lidocaine 4% applied to wound bed Bathing/ Shower/ Hygiene: May shower and wash wound with soap and water. [name], [name] ([id number]) [id number] Page 6 of 7 Edema Control - Lymphedema / SCD / Other: Elevate legs to the level of the heart or above for 30 minutes daily and/or when sitting for 3-4 times  a day throughout the day. Avoid standing for long periods of time. Exercise regularly - As Tolerated Moisturize legs daily. The following medication(s) was prescribed: lidocaine topical 4 % cream cream topical once daily was prescribed at facility WOUND #1: - Foot Wound Laterality: Right, Anterior Prim Dressing: Maxorb Extra Ag+ Alginate Dressing, 2x2 (in/in) (Generic) 1 x Per Day/30 Days ary Discharge Instructions: Apply to wound bed as instructed Secondary Dressing: Bordered Gauze, 2x2 in (Generic) 1 x Per Day/30 Days Discharge Instructions: Apply over primary dressing as directed. 11/29/2022: The wound on his anterior ankle is about half the size that it was last week. There is minimal slough accumulation on the surface. I used a  curette to debride the slough from his wound. We will continue to apply silver alginate and a foam border dressing. He will follow-up in 1 week. Electronic Signature(s) Signed: 11/29/2022 4:29:47 PM By: Duanne Guess MD FACS Entered By: Duanne Guess on 11/29/2022 16:29:47 -------------------------------------------------------------------------------- HxROS Details Patient Name: Date of Service: Gerald Moras. 11/29/2022 4:00 PM Medical Record Number: [id number] Patient Account Number: [id number] Date of Birth/Sex: Treating RN: November 29, 1980 (42 y.o. M) Primary Care Provider: [name] Other Clinician: Referring Provider: Treating Provider/Extender: [name] in Treatment: 1 Information Obtained From Patient Cardiovascular Medical History: Positive for: Hypertension Endocrine Medical History: Positive for: Type II Diabetes Time with diabetes: 17 years Treated with: Insulin, Oral agents Blood sugar tested every day: No Musculoskeletal Medical History: Past Medical History Notes: Chronic Back Pain Psychiatric Medical History: Past Medical History Notes: Chronic Depression PTSD Immunizations Pneumococcal Vaccine: Received Pneumococcal Vaccination: No Implantable Devices None ULYSESS, WITZ ([id number]) [id number] Page 7 of 7 Hospitalization / Surgery History Type of Hospitalization/Surgery Back Surgery (2017) Family and Social History Unknown History: Yes; Former smoker - ended on 06/01/2021; Marital Status - Single; Alcohol Use: Rarely; Drug Use: No History; Caffeine Use: Daily; Financial Concerns: No; Food, Clothing or Shelter Needs: No; Support System Lacking: No; Transportation Concerns: No Electronic Signature(s) Signed: 11/29/2022 4:30:15 PM By: Duanne Guess MD FACS Entered By: Duanne Guess on 11/29/2022  16:27:52 -------------------------------------------------------------------------------- SuperBill Details Patient Name: Date of Service: Gerald Moras 11/29/2022 Medical Record Number: [id number] Patient Account Number: [id number] Date of Birth/Sex: Treating RN: 02/02/1981 (42 y.o. 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 Modifier: Quantity: 1 Physician Procedures : CPT4 Code Description Modifier [id number] - WC PHYS LEVEL 3 - EST PT 25 ICD-10 Diagnosis Description 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

## 2022-11-29 NOTE — Progress Notes (Signed)
SUREN, PAYNE F ([id number]) [id number] Page 1 of 7 Visit Report for 11/29/2022 Arrival Information Details Patient Name: Date of Service: [name], [name]. 11/29/2022 4:00 PM Medical Record Number: [id number] Patient Account Number: [id number] Date of Birth/Sex: Treating RN: May 04, 1981 (42 y.o. Cline Cools Primary Care Shavona Gunderman: [name] Other Clinician: Referring Darnell Stimson: Treating Rowan Blaker/Extender: [name] in Treatment: 1 Visit Information History Since Last Visit Added or deleted any medications: No Patient Arrived: Ambulatory Any new allergies or adverse reactions: No Arrival Time: 16:08 Had a fall or experienced change in No Accompanied By: self activities of daily living that may affect Transfer Assistance: None risk of falls: Patient Identification Verified: Yes Signs or symptoms of abuse/neglect since last visito No Secondary Verification Process Completed: Yes Hospitalized since last visit: No Implantable device outside of the clinic excluding No cellular tissue based products placed in the center since last visit: Has Dressing in Place as Prescribed: Yes Pain Present Now: No Electronic Signature(s) Signed: 11/29/2022 4:30:58 PM By: Redmond Pulling RN, BSN Entered By: Redmond Pulling on 11/29/2022 16:09:17 -------------------------------------------------------------------------------- Encounter Discharge Information Details Patient Name: Date of Service: [name]. 11/29/2022 4:00 PM Medical Record Number: [id number] Patient Account Number: [id number] Date of Birth/Sex: Treating RN: October 10, 1980 (42 y.o. Cline Cools Primary Care Gustave Lindeman: [name] Other Clinician: Referring Kymiah Araiza: Treating Nasiir Monts/Extender: [name] in Treatment: 1 Encounter Discharge Information Items Post Procedure Vitals Discharge Condition: Stable Temperature (F):  97.7 Ambulatory Status: Ambulatory Pulse (bpm): 116 Discharge Destination: Home Respiratory Rate (breaths/min): 18 Transportation: Private Auto Blood Pressure (mmHg): 109/74 Accompanied By: self Schedule Follow-up Appointment: Yes Clinical Summary of Care: Patient Declined Electronic Signature(s) Signed: 11/29/2022 4:30:58 PM By: Redmond Pulling RN, BSN Entered By: Redmond Pulling on 11/29/2022 16:26:34 [place], Lenn Sink ([id number]) [id number] Page 2 of 7 -------------------------------------------------------------------------------- Lower Extremity Assessment Details Patient Name: Date of Service: [name]. 11/29/2022 4:00 PM Medical Record Number: [id number] Patient Account Number: [id number] Date of Birth/Sex: Treating RN: 02/14/1981 (42 y.o. Cline Cools Primary Care Lexington Devine: [name] Other Clinician: Referring Caiden Arteaga: Treating Naidelin Gugliotta/Extender: [name] Weeks in Treatment: 1 Edema Assessment Assessed: [Left: No] [Right: No] Edema: [Left: Yes] [Right: Yes] Calf Left: Right: Point of Measurement: 38 cm From Medial Instep 48.5 cm Ankle Left: Right: Point of Measurement: 11 cm From Medial Instep 27.5 cm Vascular Assessment Pulses: Dorsalis Pedis Palpable: [Right:Yes] Extremity colors, hair growth, and conditions: Extremity Color: [Left:Normal] [Right:Normal] Hair Growth on Extremity: [Left:No] [Right:No] Temperature of Extremity: [Left:Warm] [Right:Warm] Capillary Refill: [Right:< 3 seconds] Dependent Rubor: [Right:No No] Electronic Signature(s) Signed: 11/29/2022 4:30:58 PM By: Redmond Pulling RN, BSN Entered By: Redmond Pulling on 11/29/2022 16:12:18 -------------------------------------------------------------------------------- Multi Wound Chart Details Patient Name: Date of Service: [name]. 11/29/2022 4:00 PM Medical Record Number: [id number] Patient Account Number: [id number] Date  of Birth/Sex: Treating RN: 03/05/1981 (42 y.o. M) Primary Care Simcha Farrington: [name] Other Clinician: Referring Neftali Abair: Treating Dameon Soltis/Extender: [name] Weeks in Treatment: 1 Vital Signs Height(in): 75 Pulse(bpm): 116 Weight(lbs): 300 Blood Pressure(mmHg): 109/74 Body Mass Index(BMI): 37.5 Temperature(F): 97.7 Respiratory Rate(breaths/min): 16 [1:Photos:] [N/A:N/A] Right, Anterior Foot N/A N/A Wound Location: Blister N/A N/A Wounding Event: Diabetic Wound/Ulcer of the Lower N/A N/A Primary Etiology: Extremity Hypertension, Type II Diabetes N/A N/A Comorbid History: 09/22/2022 N/A N/A Date Acquired: 1 N/A N/A Weeks of Treatment: Open N/A N/A Wound Status: No N/A N/A Wound Recurrence: 0.6x0.6x0.1 N/A N/A Measurements L x W x D (cm)  0.283 N/A N/A A (cm) : rea 0.028 N/A N/A Volume (cm) : 60.00% N/A N/A % Reduction in A rea: 60.60% N/A N/A % Reduction in Volume: Grade 1 N/A N/A Classification: Medium N/A N/A Exudate A mount: Serosanguineous N/A N/A Exudate Type: red, brown N/A N/A Exudate Color: Large (67-100%) N/A N/A Granulation A mount: Red, Pink N/A N/A Granulation Quality: Small (1-33%) N/A N/A Necrotic A mount: Fat Layer (Subcutaneous Tissue): Yes N/A N/A Exposed Structures: Fascia: No Tendon: No Muscle: No Joint: No Bone: No Medium (34-66%) N/A N/A Epithelialization: Debridement - Selective/Open Wound N/A N/A Debridement: Pre-procedure Verification/Time Out 16:15 N/A N/A Taken: Lidocaine 4% Topical Solution N/A N/A Pain Control: Slough N/A N/A Tissue Debrided: Non-Viable Tissue N/A N/A Level: 0.28 N/A N/A Debridement A (sq cm): rea Curette N/A N/A Instrument: Minimum N/A N/A Bleeding: Pressure N/A N/A Hemostasis A chieved: Procedure was tolerated well N/A N/A Debridement Treatment Response: 0.6x0.6x0.1 N/A N/A Post Debridement Measurements L x W x D (cm) 0.028 N/A N/A Post Debridement  Volume: (cm) Scarring: Yes N/A N/A Periwound Skin Texture: No Abnormalities Noted N/A N/A Periwound Skin Moisture: No Abnormalities Noted N/A N/A Periwound Skin Color: No Abnormality N/A N/A Temperature: Debridement N/A N/A Procedures Performed: Treatment Notes Electronic Signature(s) Signed: 11/29/2022 4:25:47 PM By: Duanne Guess MD FACS Entered By: Duanne Guess on 11/29/2022 16:25:47 -------------------------------------------------------------------------------- Multi-Disciplinary Care Plan Details Patient Name: Date of Service: [name]. 11/29/2022 4:00 PM Medical Record Number: [id number] Patient Account Number: [id number] Date of Birth/Sex: Treating RN: Sep 14, 1980 (42 y.o. Cline Cools Primary Care Eula Jaster: [name] Other Clinician: Referring Marguerette Sheller: Treating Tannor Pyon/Extender: [name] Weeks in Treatment: 1 [place]and[place], Lenn Sink ([id number]) [id number] Page 4 of 7 Active Inactive Wound/Skin Impairment Nursing Diagnoses: Impaired tissue integrity Goals: Patient/caregiver will verbalize understanding of skin care regimen Date Initiated: 11/22/2022 Target Resolution Date: 02/07/2023 Goal Status: Active Interventions: Assess ulceration(s) every visit Treatment Activities: Skin care regimen initiated : 11/22/2022 Notes: Electronic Signature(s) Signed: 11/29/2022 4:30:58 PM By: Redmond Pulling RN, BSN Entered By: Redmond Pulling on 11/29/2022 16:23:42 -------------------------------------------------------------------------------- Pain Assessment Details Patient Name: Date of Service: [name]. 11/29/2022 4:00 PM Medical Record Number: [id number] Patient Account Number: [id number] Date of Birth/Sex: Treating RN: [birth year] (42 y.o. Cline Cools Primary Care Teyanna Thielman: [name] Other Clinician: Referring Ashelyn Mccravy: Treating Merrilee Ancona/Extender: [name] Weeks in Treatment: 1 Active Problems Location of Pain Severity and Description of Pain Patient Has Paino No Site Locations Pain Management and Medication Current Pain Management: Electronic Signature(s) Signed: 11/29/2022 4:30:58 PM By: Redmond Pulling RN, BSN Entered By: Redmond Pulling on 11/29/2022 16:09:47 [place], Lenn Sink ([id number]) [id number] Page 5 of 7 -------------------------------------------------------------------------------- Patient/Caregiver Education Details Patient Name: Date of Service: Alderman, BRENNA N M. 7/22/2024andnbsp4:00 PM Medical Record Number: [id number] Patient Account Number: [id number] Date of Birth/Gender: Treating RN: 1981/02/15 (42 y.o. Cline Cools Primary Care Physician: [name] Other Clinician: Referring Physician: Treating Physician/Extender: [name] in Treatment: 1 Education Assessment Education Provided To: Patient Education Topics Provided Wound/Skin Impairment: Methods: Explain/Verbal Responses: State content correctly Electronic Signature(s) Signed: 11/29/2022 4:30:58 PM By: Redmond Pulling RN, BSN Entered By: Redmond Pulling on 11/29/2022 16:24:32 -------------------------------------------------------------------------------- Wound Assessment Details Patient Name: Date of Service: [name]. 11/29/2022 4:00 PM Medical Record Number: [id number] Patient Account Number: [id number] Date of Birth/Sex: Treating RN: 11-01-1980 (42 y.o. Cline Cools Primary Care Virginie Josten: [name] Other Clinician: Referring Jameire Kouba: Treating Haidyn Chadderdon/Extender: [name] Weeks in Treatment:  1 Wound Status Wound Number: 1 Primary Etiology: Diabetic Wound/Ulcer of the Lower Extremity Wound Location: Right, Anterior Foot Wound Status: Open Wounding Event: Blister Comorbid History: Hypertension, Type II Diabetes Date Acquired:  09/22/2022 Weeks Of Treatment: 1 Clustered Wound: No Photos Wound Measurements Length: (cm) 0.6 Width: (cm) 0.6 [place], Lenn Sink ([id number]) Depth: (cm) 0.1 Area: (cm) 0.283 Volume: (cm) 0.028 % Reduction in Area: 60% % Reduction in Volume: 60.6% [id number] Page 6 of 7 Epithelialization: Medium (34-66%) Tunneling: No Undermining: No Wound Description Classification: Grade 1 Exudate Amount: Medium Exudate Type: Serosanguineous Exudate Color: red, brown Foul Odor After Cleansing: No Slough/Fibrino Yes Wound Bed Granulation Amount: Large (67-100%) Exposed Structure Granulation Quality: Red, Pink Fascia Exposed: No Necrotic Amount: Small (1-33%) Fat Layer (Subcutaneous Tissue) Exposed: Yes Necrotic Quality: Adherent Slough Tendon Exposed: No Muscle Exposed: No Joint Exposed: No Bone Exposed: No Periwound Skin Texture Texture Color No Abnormalities Noted: No No Abnormalities Noted: Yes Scarring: Yes Temperature / Pain Temperature: No Abnormality Moisture No Abnormalities Noted: Yes Treatment Notes Wound #1 (Foot) Wound Laterality: Right, Anterior Cleanser Peri-Wound Care Topical Primary Dressing Maxorb Extra Ag+ Alginate Dressing, 2x2 (in/in) Discharge Instruction: Apply to wound bed as instructed Secondary Dressing Bordered Gauze, 2x2 in Discharge Instruction: Apply over primary dressing as directed. Secured With Compression Wrap Compression Stockings Facilities manager) Signed: 11/29/2022 4:30:58 PM By: Redmond Pulling RN, BSN Entered By: Redmond Pulling on 11/29/2022 16:13:50 -------------------------------------------------------------------------------- Vitals Details Patient Name: Date of Service: [name]. 11/29/2022 4:00 PM Medical Record Number: [id number] Patient Account Number: [id number] Date of Birth/Sex: Treating RN: 10-16-1980 (42 y.o. Cline Cools Primary Care Nael Petrosyan: [name] Other  Clinician: Referring Ivet Guerrieri: Treating Emmanuel Gruenhagen/Extender: [name] Weeks in Treatment: 1 Vital Signs Time Taken: 16:09 Temperature (F): 97.7 RAE, [place] ([id number]) [id number] Page 7 of 7 Height (in): 75 Pulse (bpm): 116 Weight (lbs): 300 Respiratory Rate (breaths/min): 16 Body Mass Index (BMI): 37.5 Blood Pressure (mmHg): 109/74 Reference Range: 80 - 120 mg / dl Electronic Signature(s) Signed: 11/29/2022 4:30:58 PM By: Redmond Pulling RN, BSN Entered By: Redmond Pulling on 11/29/2022 16:09:37

## 2022-12-03 NOTE — Telephone Encounter (Signed)
Pharmacy Patient Advocate Encounter  Received notification from [facility] that Prior Authorization for Xyosted 75MG /0.5ML auto-injectors has been DENIED. Please advise how you'd like to proceed. Full denial letter will be uploaded to the media tab. See denial reason below.  You do not meet the requirements of your plan. Your plan covers this drug when you have had two tests in the morning that show low testosterone levels.   PA #/Case ID/Reference #:  [id number]

## 2022-12-04 ENCOUNTER — Other Ambulatory Visit (HOSPITAL_COMMUNITY): Payer: Self-pay | Admitting: Psychiatry

## 2022-12-04 DIAGNOSIS — F901 Attention-deficit hyperactivity disorder, predominantly hyperactive type: Secondary | ICD-10-CM

## 2022-12-04 DIAGNOSIS — F321 Major depressive disorder, single episode, moderate: Secondary | ICD-10-CM

## 2022-12-06 NOTE — Telephone Encounter (Signed)
Ask him to come back for a second morning testosterone level anytime he wants

## 2022-12-06 NOTE — Telephone Encounter (Signed)
Called patient and informed him that another lab need to be ordered. I placed order and patient will come in to get his labs drawn

## 2022-12-07 NOTE — Telephone Encounter (Signed)
[name] from [employer] called wanting a copy of the results faxed to them once the pt does his second lab. They left back a fax number [phone]

## 2022-12-08 ENCOUNTER — Other Ambulatory Visit (HOSPITAL_COMMUNITY): Payer: Self-pay | Admitting: Psychiatry

## 2022-12-08 DIAGNOSIS — F321 Major depressive disorder, single episode, moderate: Secondary | ICD-10-CM

## 2022-12-09 ENCOUNTER — Encounter (HOSPITAL_BASED_OUTPATIENT_CLINIC_OR_DEPARTMENT_OTHER): Payer: BC Managed Care – PPO | Attending: General Surgery | Admitting: General Surgery

## 2022-12-09 DIAGNOSIS — E11621 Type 2 diabetes mellitus with foot ulcer: Secondary | ICD-10-CM | POA: Insufficient documentation

## 2022-12-09 DIAGNOSIS — L97319 Non-pressure chronic ulcer of right ankle with unspecified severity: Secondary | ICD-10-CM | POA: Insufficient documentation

## 2022-12-09 DIAGNOSIS — L97512 Non-pressure chronic ulcer of other part of right foot with fat layer exposed: Secondary | ICD-10-CM | POA: Diagnosis not present

## 2022-12-09 DIAGNOSIS — I10 Essential (primary) hypertension: Secondary | ICD-10-CM | POA: Insufficient documentation

## 2022-12-09 DIAGNOSIS — I890 Lymphedema, not elsewhere classified: Secondary | ICD-10-CM | POA: Diagnosis not present

## 2022-12-09 DIAGNOSIS — E1151 Type 2 diabetes mellitus with diabetic peripheral angiopathy without gangrene: Secondary | ICD-10-CM | POA: Diagnosis not present

## 2022-12-09 NOTE — Progress Notes (Signed)
[name], [name] ([id number]) [id number] Page 1 of 6 Visit Report for 12/09/2022 Chief Complaint Document Details Patient Name: Date of Service: [name], [name]. 12/09/2022 [name]:00 PM Medical Record Number: [id number] Patient Account Number: [id number] Date of Birth/Sex: Treating RN: Mar 24, 1981 (42 y.o. M) Primary Care Provider: Edwina [name] Other Clinician: Referring Provider: Treating Provider/Extender: [name] Weeks in Treatment: [name] Information Obtained from: Patient Chief Complaint Patients presents for treatment of an open diabetic ulcer Electronic Signature(s) Signed: 12/09/2022 [name]:40:28 PM By: Gerald Guess MD FACS Entered By: [name] on 12/09/2022 14:40:28 -------------------------------------------------------------------------------- HPI Details Patient Name: Date of Service: [name]. 12/09/2022 [name]:00 PM Medical Record Number: [id number] Patient Account Number: [id number] Date of Birth/Sex: Treating RN: Dec 23, 1980 (42 y.o. M) Primary Care Provider: Edwina [name] Other Clinician: Referring Provider: Treating Provider/Extender: [name] Weeks in Treatment: [name] History of Present Illness HPI Description: ADMISSION 11/22/2022 This is a 42 year old morbidly obese, poorly controlled type II diabetic (last hemoglobin A1c 12.3% on July [name], 2024) who was referred by his primary care provider for further evaluation and management of a wound on his right midfoot. The patient says that it has been present for about [name] months and started out as a blister. Looking at the foot where he is wearing today, it appears that the tongue of the shoe hits in exactly the spot so it may be a footwear-related injury. He says he has not put anything on the wound, not even a Band-Aid. 11/29/2022: The wound on his anterior ankle is about half the size that it was last week. There is minimal slough accumulation on  the surface. 12/09/2022: His wound is healed Electronic Signature(s) Signed: 12/09/2022 [name]:42:45 PM By: Gerald Guess MD FACS Entered By: [name] on 12/09/2022 14:42:45 -------------------------------------------------------------------------------- Physical Exam Details Patient Name: Date of Service: [name]. 12/09/2022 [name]:00 PM [name] ([id number]) [id number] Page [name] of 6 Medical Record Number: [id number] Patient Account Number: [id number] Date of Birth/Sex: Treating RN: 21-Sep-1980 (42 y.o. M) Primary Care Provider: Edwina [name] Other Clinician: Referring Provider: Treating Provider/Extender: [name] Weeks in Treatment: [name] Constitutional .Tachycardic, asymptomatic. . . no acute distress. Respiratory Normal work of breathing on room air. Notes 12/09/2022: His wound is healed Electronic Signature(s) Signed: 12/09/2022 [name]:43:16 PM By: Gerald Guess MD FACS Entered By: [name] on 12/09/2022 14:43:16 -------------------------------------------------------------------------------- Physician Orders Details Patient Name: Date of Service: [name]. 12/09/2022 [name]:00 PM Medical Record Number: [id number] Patient Account Number: [id number] Date of Birth/Sex: Treating RN: 09/28/1980 (42 y.o. 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 Keep wound area covered to prevent wound reopening Bathing/ Shower/  Hygiene May shower and wash wound with soap and water. Edema Control - Lymphedema / SCD / Other Bilateral Lower Extremities Elevate legs to the level of the heart or above for 30 minutes daily and/or when sitting for 3-4 times a day throughout the day. Avoid standing for long periods of time. Exercise regularly - As T olerated Moisturize legs daily. Electronic Signature(s) Signed: 12/09/2022 [name]:43:30 PM By: Gerald Guess MD FACS Entered By: [name],  [name] on 12/09/2022 14:43:29 Problem List Details -------------------------------------------------------------------------------- [name], [name] ([id number]) [id number] Page 3 of 6 Patient Name: Date of Service: [name], [name]. 12/09/2022 [name]:00 PM Medical Record Number: [id number] Patient Account Number: [id number] Date of Birth/Sex: Treating RN: 05/08/1981 (42 y.o. 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 12/09/2022 [name]:00 PM Medical Record Number: [id number] Patient Account Number: [id number] Date of Birth/Sex: Treating RN: [birth year] (42 y.o. M) Primary Care Provider: Edwina [name] Other Clinician: Referring Provider: Treating Provider/Extender: [name] Weeks in Treatment: [name] Subjective Chief Complaint Information obtained from Patient Patients presents for treatment of an open diabetic ulcer History of Present Illness (HPI) ADMISSION 11/22/2022 This is a 42 year old morbidly obese, poorly controlled type II diabetic (last hemoglobin A1c 12.3% on July [name], 2024) who was referred by his primary care provider for further evaluation and management of a wound on his right midfoot. The patient says that it has been present for about [name] months and started out as a blister. Looking at the foot where he is wearing today, it appears that the tongue of the shoe hits in exactly the spot so it may be a footwear-related injury. He says he has not put anything on the wound, not even a Band-Aid. 11/29/2022: The wound on his anterior ankle is about half the size that it was last week. There is minimal slough accumulation on the surface. 12/09/2022: His wound is healed Patient History Information obtained from Patient. Family History Unknown History. Social History Former smoker - ended on 06/01/2021, Marital Status - Single, Alcohol Use - Rarely, Drug Use - No History, Caffeine Use - Daily. [name], [name] ([id number]) [id number] Page 4 of 6 Medical History Cardiovascular Patient has history of Hypertension Endocrine Patient has history of Type II Diabetes Hospitalization/Surgery History - Back Surgery (2017). Medical A Surgical History Notes nd Musculoskeletal Chronic Back Pain Psychiatric Chronic Depression PTSD Objective Constitutional Tachycardic, asymptomatic. no acute distress. Vitals Time Taken: [name]:15 PM, Height: 75 in, Weight: 300 lbs, BMI: 37.5, Temperature: 98.[name] F, Pulse: 124 bpm, Respiratory Rate: 16 breaths/min, Blood Pressure: 125/82 mmHg. Respiratory Normal work of breathing on room air. General Notes: 12/09/2022: His wound is healed Integumentary (Hair, Skin) Wound #1 status is  Open. Original cause of wound was Blister. The date acquired was: 09/22/2022. The wound has been in treatment [name] weeks. The wound is located on the Right,Anterior Foot. The wound measures 0cm length x 0cm width x 0cm depth; 0cm^[name] area and 0cm^3 volume. There is no tunneling or undermining noted. There is a none present amount of drainage noted. There is no granulation within the wound bed. There is no necrotic tissue within the wound bed. The periwound skin appearance had no abnormalities noted for moisture. The periwound skin appearance had no abnormalities noted for color. The periwound skin appearance exhibited: Scarring. Periwound temperature was noted as No Abnormality. 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 Keep wound area covered to prevent wound reopening Bathing/ Shower/ Hygiene: May shower and wash wound with soap and water. Edema Control - Lymphedema / SCD / Other: Elevate legs to the level of the heart or above for 30 minutes daily and/or when sitting for 3-4 times a day throughout the day. Avoid standing for long periods of time. Exercise regularly - As Tolerated Moisturize legs daily. 12/09/2022: His wound is healed. I recommended that he pad and protect the area on his ankle where the wound was as the tongue of his shoe seems to rub in that location and I think that was probably the etiology of his injury in the first place. We will discharge him from the wound care center and he may follow-up in the future as needed. Electronic Signature(s) Signed: 12/09/2022 [name]:44:47 PM By: Gerald Guess MD FACS [name] ([id number]) [id number] Page 5 of 6 Entered By: [name] on 12/09/2022  14:44:47 -------------------------------------------------------------------------------- HxROS Details Patient Name: Date of Service: [name], [name]. 12/09/2022 [name]:00 PM Medical Record Number: [id number] Patient Account Number: [id number] Date of Birth/Sex: Treating RN: 04-15-1981 (42 y.o. M) Primary Care Provider: Edwina [name] Other Clinician: Referring Provider: Treating Provider/Extender: [name] Information Obtained From Patient Cardiovascular Medical History: Positive for: Hypertension Endocrine Medical History: Positive for: Type II Diabetes Time with diabetes: 17 years Treated with: Insulin, Oral agents Blood sugar tested every day: No Musculoskeletal Medical History: Past Medical History Notes: Chronic Back Pain Psychiatric Medical History: Past Medical History Notes: Chronic Depression PTSD Immunizations Pneumococcal Vaccine: Received Pneumococcal Vaccination: No Implantable Devices None Hospitalization / Surgery History Type of Hospitalization/Surgery Back Surgery (2017) Family and Social History Unknown History: Yes; Former smoker - ended on 06/01/2021; Marital Status - Single; Alcohol Use: Rarely; Drug Use: No History; Caffeine Use: Daily; Financial Concerns: No; Food, Clothing or Shelter Needs: No; Support System Lacking: No; Transportation Concerns: No Electronic Signature(s) Signed: 12/09/2022 3:16:59 PM By: Gerald Guess MD FACS Entered By: [name] on 12/09/2022 14:42:50 Radwan, Lenn Sink ([id number]) [id number] Page 6 of 6 -------------------------------------------------------------------------------- SuperBill Details Patient Name: Date of Service: [name], [name] 12/09/2022 Medical Record Number: [id number] Patient Account Number: [id number] Date of Birth/Sex: Treating RN: 11-Sep-1980 (42 y.o. 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 E11.621 Type [name] diabetes mellitus with foot ulcer E66.01 Morbid (severe) obesity due to excess calories Facility Procedures : CPT4 Code: [id number] Description: [phone] - WOUND CARE VISIT-LEV [name] EST PT Modifier: Quantity: 1 Physician Procedures : CPT4 Code Description Modifier [id number] [phone] - WC PHYS LEVEL [name] - EST PT ICD-10 Diagnosis Description 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

## 2022-12-09 NOTE — Progress Notes (Signed)
NAZARIY, DUNKLEY ([id number]) [id number] Page 1 of 8 Visit Report for 12/09/2022 Arrival Information Details Patient Name: Date of Service: [name], [name]. 12/09/2022 2:00 PM Medical Record Number: [id number] Patient Account Number: [id number] Date of Birth/Sex: Treating RN: [birth year] (42 y.o. [name] Primary Care [name]: [name] Other Clinician: Referring [name]: Treating [name] in Treatment: 2 Visit Information History Since Last Visit Added or deleted any medications: No Patient Arrived: Ambulatory Any new allergies or adverse reactions: No Arrival Time: 14:13 Had a fall or experienced change in No Accompanied By: self activities of daily living that may affect Transfer Assistance: None risk of falls: Patient Identification Verified: Yes Signs or symptoms of abuse/neglect since last visito No Secondary Verification Process Completed: Yes Hospitalized since last visit: No Implantable device outside of the clinic excluding No cellular tissue based products placed in the center since last visit: Has Dressing in Place as Prescribed: Yes Pain Present Now: No Electronic Signature(s) Signed: 12/09/2022 3:40:07 PM By: Redmond Pulling RN, BSN Entered By: Redmond Pulling on 12/09/2022 14:18:04 -------------------------------------------------------------------------------- Clinic Level of Care Assessment Details Patient Name: Date of Service: [name], [name]. 12/09/2022 2:00 PM Medical Record Number: [id number] Patient Account Number: [id number] Date of Birth/Sex: Treating RN: [name] (42 y.o. [name] Primary Care [name]: [name] Other Clinician: Referring [name]: Treating [name]/Extender: [name] in Treatment: 2 Clinic Level of Care Assessment Items TOOL 4 Quantity Score X- 1 0 Use when only an EandM is performed on  FOLLOW-UP visit ASSESSMENTS - Nursing Assessment / Reassessment X- 1 10 Reassessment of Co-morbidities (includes updates in patient status) X- 1 5 Reassessment of Adherence to Treatment Plan ASSESSMENTS - Wound and Skin A ssessment / Reassessment X - Simple Wound Assessment / Reassessment - one wound 1 5 []  - 0 Complex Wound Assessment / Reassessment - multiple wounds []  - 0 Dermatologic / Skin Assessment (not related to wound area) ASSESSMENTS - Focused Assessment X- 1 5 Circumferential Edema Measurements - multi extremities []  - 0 Nutritional Assessment / Counseling / Intervention [name] ([id number]) [id number] Page 2 of 8 []  - 0 Lower Extremity Assessment (monofilament, tuning fork, pulses) []  - 0 Peripheral Arterial Disease Assessment (using hand held doppler) ASSESSMENTS - Ostomy and/or Continence Assessment and Care []  - 0 Incontinence Assessment and Management []  - 0 Ostomy Care Assessment and Management (repouching, etc.) PROCESS - Coordination of Care []  - 0 Simple Patient / Family Education for ongoing care []  - 0 Complex (extensive) Patient / Family Education for ongoing care X- 1 10 Staff obtains Chiropractor, Records, T Results / Process Orders est []  - 0 Staff telephones HHA, Nursing Homes / Clarify orders / etc []  - 0 Routine Transfer to another Facility (non-emergent condition) []  - 0 Routine Hospital Admission (non-emergent condition) []  - 0 New Admissions / Manufacturing engineer / Ordering NPWT Apligraf, etc. , []  - 0 Emergency Hospital Admission (emergent condition) X- 1 10 Simple Discharge Coordination []  - 0 Complex (extensive) Discharge Coordination PROCESS - Special Needs []  - 0 Pediatric / Minor Patient Management []  - 0 Isolation Patient Management []  - 0 Hearing / Language / Visual special needs []  - 0 Assessment of Community assistance (transportation, D/C planning, etc.) []  - 0 Additional  assistance / Altered mentation []  - 0 Support Surface(s) Assessment (bed, cushion, seat, etc.) INTERVENTIONS - Wound Cleansing / Measurement X - Simple Wound Cleansing - one wound 1 5 []  - 0 Complex Wound  Cleansing - multiple wounds X- 1 5 Wound Imaging (photographs - any number of wounds) []  - 0 Wound Tracing (instead of photographs) X- 1 5 Simple Wound Measurement - one wound []  - 0 Complex Wound Measurement - multiple wounds INTERVENTIONS - Wound Dressings []  - 0 Small Wound Dressing one or multiple wounds []  - 0 Medium Wound Dressing one or multiple wounds []  - 0 Large Wound Dressing one or multiple wounds []  - 0 Application of Medications - topical []  - 0 Application of Medications - injection INTERVENTIONS - Miscellaneous []  - 0 External ear exam []  - 0 Specimen Collection (cultures, biopsies, blood, body fluids, etc.) []  - 0 Specimen(s) / Culture(s) sent or taken to Lab for analysis []  - 0 Patient Transfer (multiple staff / Nurse, adult / Similar devices) []  - 0 Simple Staple / Suture removal (25 or less) []  - 0 Complex Staple / Suture removal (26 or more) []  - 0 Hypo / Hyperglycemic Management (close monitor of Blood Glucose) [name] ([id number]) [id number] Page 3 of 8 []  - 0 Ankle / Brachial Index (ABI) - do not check if billed separately X- 1 5 Vital Signs Has the patient been seen at the hospital within the last three years: Yes Total Score: 65 Level Of Care: New/Established - Level 2 Electronic Signature(s) Signed: 12/09/2022 3:40:07 PM By: Redmond Pulling RN, BSN Entered By: Redmond Pulling on 12/09/2022 14:[name]:01 -------------------------------------------------------------------------------- Encounter Discharge Information Details Patient Name: Date of Service: [name]. 12/09/2022 2:00 PM Medical Record Number: [id number] Patient Account Number: [id number] Date of Birth/Sex: Treating RN: [birth year]/[name] (42 y.o. [name] Primary Care [name]: [name] Other Clinician: Referring [name]: Treating [name]/Extender: [name] in Treatment: 2 Encounter Discharge Information Items Discharge Condition: Stable Ambulatory Status: Ambulatory Discharge Destination: Home Transportation: Private Auto Accompanied By: self Schedule Follow-up Appointment: Yes Clinical Summary of Care: Patient Declined Electronic Signature(s) Signed: 12/09/2022 3:40:07 PM By: Redmond Pulling RN, BSN Entered By: Redmond Pulling on 12/09/2022 14:[name]:49 -------------------------------------------------------------------------------- Lower Extremity Assessment Details Patient Name: Date of Service: [name]. 12/09/2022 2:00 PM Medical Record Number: [id number] Patient Account Number: [id number] Date of Birth/Sex: Treating RN: [name], [name] (42 y.o. [name] Primary Care Latice Waitman: [name] Other Clinician: Referring Chaslyn Eisen: Treating Broxton Broady/Extender: [name] Weeks in Treatment: 2 Edema Assessment Assessed: [Left: No] [Right: No] Edema: [Left: Yes] [Right: Yes] Calf Left: Right: Point of Measurement: 38 cm From Medial Instep 49 cm Ankle Left: Right: Point of Measurement: 11 cm From Medial Instep 27 cm Vascular Assessment ADRIAL, TY ([id number]) [Right:[id number] Page 4 of 8] Pulses: Dorsalis Pedis Palpable: [Right:Yes] Extremity colors, hair growth, and conditions: Extremity Color: [Left:Normal] [Right:Normal] Hair Growth on Extremity: [Left:No] [Right:No] Temperature of Extremity: [Left:Warm] [Right:Warm] Capillary Refill: [Right:< 3 seconds] Dependent Rubor: [Right:No No] Electronic Signature(s) Signed: 12/09/2022 3:40:07 PM By: Redmond Pulling RN, BSN Entered By: Redmond Pulling on 12/09/2022 14:20:11 -------------------------------------------------------------------------------- Multi  Wound Chart Details Patient Name: Date of Service: [name]. 12/09/2022 2:00 PM Medical Record Number: [id number] Patient Account Number: [id number] Date of Birth/Sex: Treating RN: [name] (42 y.o. M) Primary Care Mckoy Bhakta: [name] Other Clinician: Referring Avrey Flanagin: Treating Dlisa Barnwell/Extender: [name] Weeks in Treatment: 2 Vital Signs Height(in): 75 Pulse(bpm): 124 Weight(lbs): 300 Blood Pressure(mmHg): 125/82 Body Mass Index(BMI): 37.5 Temperature(F): 98.2 Respiratory Rate(breaths/min): 16 [1:Photos:] [N/A:N/A] Right, Anterior Foot N/A N/A Wound Location: Blister N/A N/A Wounding Event: Diabetic Wound/Ulcer of the Lower N/A N/A Primary Etiology: Extremity  Hypertension, Type II Diabetes N/A N/A Comorbid History: 09/22/2022 N/A N/A Date Acquired: 2 N/A N/A Weeks of Treatment: Open N/A N/A Wound Status: No N/A N/A Wound Recurrence: 0x0x0 N/A N/A Measurements L x W x D (cm) 0 N/A N/A A (cm) : rea 0 N/A N/A Volume (cm) : 100.00% N/A N/A % Reduction in A rea: 100.00% N/A N/A % Reduction in Volume: Grade 1 N/A N/A Classification: None Present N/A N/A Exudate A mount: None Present (0%) N/A N/A Granulation A mount: None Present (0%) N/A N/A Necrotic A mount: Fascia: No N/A N/A Exposed Structures: Fat Layer (Subcutaneous Tissue): No Tendon: No Muscle: No Joint: No Bone: No Large (67-100%) N/A N/A Epithelialization: Scarring: Yes N/A N/A Periwound Skin Texture: No Abnormalities Noted N/A N/A Periwound Skin MoistureSOSUKE, [name] ([id number]) [id number] Page 5 of 8 No Abnormalities Noted N/A N/A Periwound Skin Color: No Abnormality N/A N/A Temperature: Treatment Notes Wound #1 (Foot) Wound Laterality: Right, Anterior Cleanser Peri-Wound Care Topical Primary Dressing Secondary Dressing Secured With Compression Wrap Compression Stockings Add-Ons Electronic  Signature(s) Signed: 12/09/2022 2:40:03 PM By: Duanne Guess MD FACS Entered By: Duanne Guess on 12/09/2022 14:40:03 -------------------------------------------------------------------------------- Multi-Disciplinary Care Plan Details Patient Name: Date of Service: [name]. 12/09/2022 2:00 PM Medical Record Number: [id number] Patient Account Number: [id number] Date of Birth/Sex: Treating RN: [birth year], [name] (42 y.o. [name] Primary Care Fawnda Vitullo: [name] Other Clinician: Referring Erion Weightman: Treating Eleftherios Dudenhoeffer/Extender: [name] Weeks in Treatment: 2 Active Inactive Electronic Signature(s) Signed: 12/09/2022 3:40:07 PM By: Redmond Pulling RN, BSN Entered By: Redmond Pulling on 12/09/2022 62:37:62 -------------------------------------------------------------------------------- Pain Assessment Details Patient Name: Date of Service: [name]. 12/09/2022 2:00 PM Medical Record Number: [id number] Patient Account Number: [id number] Date of Birth/Sex: Treating RN: [name], [name] (42 y.o. [name] Primary Care Brynlie Daza: [name] Other Clinician: Referring Aldin Drees: Treating Kyan Yurkovich/Extender: [name] Weeks in Treatment: 2 Active Problems Location of Pain Severity and Description of Pain Patient Has Paino No Site Locations [name], [name] [place] ([id number]) [id number] Page 6 of 8 Pain Management and Medication Current Pain Management: Electronic Signature(s) Signed: 12/09/2022 3:40:07 PM By: Redmond Pulling RN, BSN Entered By: Redmond Pulling on 12/09/2022 14:18:38 -------------------------------------------------------------------------------- Patient/Caregiver Education Details Patient Name: Date of Service: [name] 8/1/2024andnbsp2:00 PM Medical Record Number: [id number] Patient Account Number: [id number] Date of Birth/Gender: Treating RN: [name] (42 y.o. [name] Primary Care Physician: [name] Other Clinician: Referring Physician: Treating Physician/Extender: [name] in Treatment: 2 Education Assessment Education Provided To: Patient Education Topics Provided Wound/Skin Impairment: Methods: Explain/Verbal Responses: State content correctly Electronic Signature(s) Signed: 12/09/2022 3:40:07 PM By: Redmond Pulling RN, BSN Entered By: Redmond Pulling on 12/09/2022 14:28:51 -------------------------------------------------------------------------------- Wound Assessment Details Patient Name: Date of Service: [name]. 12/09/2022 2:00 PM Medical Record Number: [id number] Patient Account Number: [id number] Date of Birth/Sex: Treating RN: [name] (42 y.o. [name] Primary Care Weber Monnier: [name] Other Clinician: Referring Brogan England: Treating Jowana Thumma/Extender: [name] [name] ([id number]) [id number] Page 7 of 8 Weeks in Treatment: 2 Wound Status Wound Number: 1 Primary Etiology: Diabetic Wound/Ulcer of the Lower Extremity Wound Location: Right, Anterior Foot Wound Status: Open Wounding Event: Blister Comorbid History: Hypertension, Type II Diabetes Date Acquired: 09/22/2022 Weeks Of Treatment: 2 Clustered Wound: No Photos Wound Measurements Length: (cm) Width: (cm) Depth: (cm) Area: (cm) Volume: (cm) 0 % Reduction in Area: 100% 0 % Reduction in Volume: 100% 0 Epithelialization: Large (67-100%) 0  Tunneling: No 0 Undermining: No Wound Description Classification: Grade 1 Exudate Amount: None Present Foul Odor After Cleansing: No Slough/Fibrino No Wound Bed Granulation Amount: None Present (0%) Exposed Structure Necrotic Amount: None Present (0%) Fascia Exposed: No Fat Layer (Subcutaneous Tissue) Exposed: No Tendon Exposed: No Muscle Exposed: No Joint Exposed: No Bone Exposed:  No Periwound Skin Texture Texture Color No Abnormalities Noted: No No Abnormalities Noted: Yes Scarring: Yes Temperature / Pain Temperature: No Abnormality Moisture No Abnormalities Noted: Yes Electronic Signature(s) Signed: 12/09/2022 3:40:07 PM By: Redmond Pulling RN, BSN Entered By: Redmond Pulling on 12/09/2022 14:21:49 -------------------------------------------------------------------------------- Vitals Details Patient Name: Date of Service: [name]. 12/09/2022 2:00 PM Medical Record Number: [id number] Patient Account Number: [id number] Date of Birth/Sex: Treating RN: [name][birth year] (42 y.o. [name] Primary Care Shanvi Moyd: [name] Other Clinician: Referring Jerricka Carvey: Treating Anmol Fleck/Extender: [name] Weeks in Treatment: 2 KEONTRE, CLOUTIER Judie Petit ([id number]) [id number] Page 8 of 8 Vital Signs Time Taken: 14:15 Temperature (F): 98.2 Height (in): 75 Pulse (bpm): 124 Weight (lbs): 300 Respiratory Rate (breaths/min): 16 Body Mass Index (BMI): 37.5 Blood Pressure (mmHg): 125/82 Reference Range: 80 - 120 mg / dl Electronic Signature(s) Signed: 12/09/2022 3:40:07 PM By: Redmond Pulling RN, BSN Entered By: Redmond Pulling on 12/09/2022 14:18:[name]

## 2022-12-10 ENCOUNTER — Encounter (HOSPITAL_BASED_OUTPATIENT_CLINIC_OR_DEPARTMENT_OTHER): Payer: BC Managed Care – PPO | Admitting: Internal Medicine

## 2023-01-03 ENCOUNTER — Other Ambulatory Visit (INDEPENDENT_AMBULATORY_CARE_PROVIDER_SITE_OTHER): Payer: BC Managed Care – PPO

## 2023-01-03 ENCOUNTER — Encounter: Payer: Self-pay | Admitting: Emergency Medicine

## 2023-01-03 ENCOUNTER — Other Ambulatory Visit: Payer: Self-pay | Admitting: Emergency Medicine

## 2023-01-03 DIAGNOSIS — E291 Testicular hypofunction: Secondary | ICD-10-CM

## 2023-01-03 LAB — TESTOSTERONE: Testosterone: 129.44 ng/dL — ABNORMAL LOW (ref 300.00–890.00)

## 2023-01-03 MED ORDER — XYOSTED 75 MG/0.5ML ~~LOC~~ SOAJ
75.0000 mg | SUBCUTANEOUS | 5 refills | Status: DC
Start: 2023-01-03 — End: 2023-06-27

## 2023-01-08 ENCOUNTER — Other Ambulatory Visit (HOSPITAL_COMMUNITY): Payer: Self-pay | Admitting: Psychiatry

## 2023-01-08 DIAGNOSIS — F321 Major depressive disorder, single episode, moderate: Secondary | ICD-10-CM
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
## 2023-01-27 ENCOUNTER — Encounter: Payer: Self-pay | Admitting: Emergency Medicine

## 2023-01-27 MED ORDER — PEN NEEDLES 32G X 4 MM MISC: 3 refills | Status: DC

## 2023-02-02 ENCOUNTER — Ambulatory Visit: Payer: BC Managed Care – PPO | Admitting: Psychology

## 2023-02-02 DIAGNOSIS — F89 Unspecified disorder of psychological development: Secondary | ICD-10-CM | POA: Diagnosis not present

## 2023-02-03 ENCOUNTER — Other Ambulatory Visit: Payer: Self-pay | Admitting: Emergency Medicine

## 2023-02-18 ENCOUNTER — Ambulatory Visit: Payer: BC Managed Care – PPO | Admitting: Sports Medicine

## 2023-02-23 ENCOUNTER — Ambulatory Visit (INDEPENDENT_AMBULATORY_CARE_PROVIDER_SITE_OTHER): Payer: BC Managed Care – PPO | Admitting: Psychology

## 2023-02-23 DIAGNOSIS — F89 Unspecified disorder of psychological development: Secondary | ICD-10-CM | POA: Diagnosis not present

## 2023-02-23 DIAGNOSIS — F439 Reaction to severe stress, unspecified: Secondary | ICD-10-CM | POA: Diagnosis not present

## 2023-02-23 NOTE — Progress Notes (Addendum)
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 scored 22/27, which indicates severe depression.   Generalized Anxiety Disorder-7 (GAD-7): Mr. Venteicher completed the GAD-7, a self-report measure  that assesses symptoms of anxiety. He scored 19/21, which indicates severe anxiety.   SUMMARY AND CLINICAL IMPRESSIONS: Mr. Ivica Risenhoover is a 42 year old male who [name] referred for an evaluation to determine if he currently meets the criteria for a diagnosis of Attention-Deficit/Hyperactivity Disorder (ADHD).   Mr. Tumolo reported experiencing ADHD-related symptoms in childhood, noting his mother suspected he had the condition but did not believe he "needed medication for it," so an ADHD evaluation was not pursued. He shared that information he has obtained online about ADHD since this time is similar to his experiences, which led to him seeking an ADHD evaluation. He expressed a belief his ADHD-related symptoms are consistent and independent of mood.   Mr. Azpeitia was administered the WAIS-IV and CNS Vital Signs to measure his current cognitive abilities, although he did not complete the CNS Vital Signs. His verbal comprehension abilities were in the low average range and comparably developed. His ability to sustain attention, concentrate, and exert mental control was in the borderline range, which suggests a general weakness in attention, concentration, mental control, shorter-term auditory memory, and arithmetic computational skills.   During the clinical interview and on self-report measures, Mr. Zempel endorsed  executive functioning concerns that include attentional dysregulation, hyperactivity- and impulsivity-related symptoms, and meeting the full criteria for ADHD. Moreover, his friend, Mr. Dulcy Fanny, indicated that he perceived Mr. Kehn as experiencing several issues with executive functioning. However, despite multiple attempts by this evaluator to reach him over a several-week period, Mr. Fetterhoff did not complete all of the administered measures (i.e., CNS Vital Signs, ASRSv1.1., PCL-5, and PAI). While Mr. Tassinari and his informant gave indications he may meet the full criteria for ADHD, without the remaining measures, it is unclear if his endorsed ADHD-related symptoms are better explained by other endorsed mental health (e.g., trauma, depression, and anxiety) and medical (e.g., seizures) concerns. As such, a diagnosis of ADHD was not given.   Mr. 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 The PHQ-9 and GAD-7 were administered. Mr. Conquest indicated experiencing severe depression- and anxiety-related symptomatology. When considering that Mr. Gaylord indicated regularly experiencing many Posttraumatic Stress Disorder (PTSD) symptoms, it appears likely that he meets the criteria for the diagnosis. However, given the missing information, this evaluator could not  definitively determine if the full criteria were met. 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 Further evaluation of his endorsed mental health concerns is recommended to definitively rule in or out ADHD, PTSD, depressive disorder, anxiety disorder(s), schizophrenia spectrum and other psychotic disorder, and eating disorder. Moreover, Mr. Adragna would likely benefit from discussing his endorsed visual hallucination-related symptoms with his medical care team, given his described ongoing medical concerns (e.g., hypertension, diabetes, and focal seizures).   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 Licensed Psychologist - [facility] [phone]              Beryle Beams  Gypsy Lore, PsyD

## 2023-03-09 ENCOUNTER — Ambulatory Visit: Payer: BC Managed Care – PPO | Admitting: Psychology

## 2023-03-09 ENCOUNTER — Telehealth: Payer: Self-pay | Admitting: Psychology

## 2023-03-09 NOTE — Telephone Encounter (Signed)
This Clinical research associate contacted [name] via telephone to check-in and notify him that because the remaining measures were not completed, the feedback appointment for today needed to be rescheduled. He was unable to be reached. A HIPAA-compliant voicemail was left.

## 2023-03-14 ENCOUNTER — Encounter: Payer: Self-pay | Admitting: Emergency Medicine

## 2023-03-14 ENCOUNTER — Other Ambulatory Visit: Payer: Self-pay | Admitting: Emergency Medicine

## 2023-03-14 DIAGNOSIS — M5412 Radiculopathy, cervical region: Secondary | ICD-10-CM

## 2023-03-14 DIAGNOSIS — M5090 Cervical disc disorder, unspecified, unspecified cervical region: Secondary | ICD-10-CM

## 2023-03-14 MED ORDER — GABAPENTIN 300 MG PO CAPS: 300.0000 mg | ORAL_CAPSULE | Freq: Three times a day (TID) | ORAL | 0 refills | Status: DC

## 2023-03-14 NOTE — Telephone Encounter (Signed)
New prescription for gabapentin sent to pharmacy of record today.  Thanks.

## 2023-03-16 ENCOUNTER — Other Ambulatory Visit: Payer: Self-pay | Admitting: Internal Medicine

## 2023-03-21 ENCOUNTER — Other Ambulatory Visit: Payer: BC Managed Care – PPO

## 2023-03-21 ENCOUNTER — Other Ambulatory Visit: Payer: Self-pay

## 2023-03-21 DIAGNOSIS — E1165 Type 2 diabetes mellitus with hyperglycemia: Secondary | ICD-10-CM

## 2023-03-23 ENCOUNTER — Ambulatory Visit: Payer: BC Managed Care – PPO | Admitting: "Endocrinology

## 2023-03-31 ENCOUNTER — Telehealth: Payer: Self-pay | Admitting: Psychology

## 2023-03-31 NOTE — Telephone Encounter (Signed)
This Clinical research associate contacted the patient via telephone to check-in as the had not heard back from him. The patient was unable to be reached. A HIPAA-compliant voicemail was left noting this writer would resend any of the still pending measures but that if they were not completed or he had not called this writer back by 11/27, it would be assumed he was no longer interested in completing the evaluation and the report would be finalized. This Clinical research associate also noted that given the missing information, if the report was finalized, it would be unlikely that it would be diagnostically conclusive. No further follow-up by this writer is planned.

## 2023-04-10 HISTORY — PX: OTHER SURGICAL HISTORY: SHX169

## 2023-04-11 DIAGNOSIS — F32A Depression, unspecified: Secondary | ICD-10-CM | POA: Diagnosis not present

## 2023-04-11 DIAGNOSIS — M549 Dorsalgia, unspecified: Secondary | ICD-10-CM | POA: Diagnosis not present

## 2023-04-11 DIAGNOSIS — E1142 Type 2 diabetes mellitus with diabetic polyneuropathy: Secondary | ICD-10-CM | POA: Diagnosis not present

## 2023-04-11 DIAGNOSIS — Z1152 Encounter for screening for COVID-19: Secondary | ICD-10-CM | POA: Diagnosis not present

## 2023-04-11 DIAGNOSIS — B952 Enterococcus as the cause of diseases classified elsewhere: Secondary | ICD-10-CM | POA: Diagnosis not present

## 2023-04-11 DIAGNOSIS — Z794 Long term (current) use of insulin: Secondary | ICD-10-CM | POA: Diagnosis not present

## 2023-04-11 DIAGNOSIS — Z7984 Long term (current) use of oral hypoglycemic drugs: Secondary | ICD-10-CM | POA: Diagnosis not present

## 2023-04-11 DIAGNOSIS — Z79899 Other long term (current) drug therapy: Secondary | ICD-10-CM | POA: Diagnosis not present

## 2023-04-11 DIAGNOSIS — E1165 Type 2 diabetes mellitus with hyperglycemia: Secondary | ICD-10-CM | POA: Diagnosis not present

## 2023-04-11 DIAGNOSIS — E11628 Type 2 diabetes mellitus with other skin complications: Secondary | ICD-10-CM | POA: Diagnosis not present

## 2023-04-11 DIAGNOSIS — E11621 Type 2 diabetes mellitus with foot ulcer: Secondary | ICD-10-CM | POA: Diagnosis not present

## 2023-04-11 DIAGNOSIS — Z720 Tobacco use: Secondary | ICD-10-CM | POA: Diagnosis not present

## 2023-04-11 DIAGNOSIS — G8929 Other chronic pain: Secondary | ICD-10-CM | POA: Diagnosis not present

## 2023-04-11 DIAGNOSIS — F909 Attention-deficit hyperactivity disorder, unspecified type: Secondary | ICD-10-CM | POA: Diagnosis not present

## 2023-04-11 DIAGNOSIS — E1169 Type 2 diabetes mellitus with other specified complication: Secondary | ICD-10-CM | POA: Diagnosis not present

## 2023-04-11 DIAGNOSIS — Z87892 Personal history of anaphylaxis: Secondary | ICD-10-CM | POA: Diagnosis not present

## 2023-04-11 DIAGNOSIS — I10 Essential (primary) hypertension: Secondary | ICD-10-CM | POA: Diagnosis not present

## 2023-04-11 DIAGNOSIS — L97529 Non-pressure chronic ulcer of other part of left foot with unspecified severity: Secondary | ICD-10-CM | POA: Diagnosis not present

## 2023-04-11 DIAGNOSIS — L02612 Cutaneous abscess of left foot: Secondary | ICD-10-CM | POA: Diagnosis not present

## 2023-04-11 DIAGNOSIS — R6510 Systemic inflammatory response syndrome (SIRS) of non-infectious origin without acute organ dysfunction: Secondary | ICD-10-CM | POA: Diagnosis not present

## 2023-04-11 DIAGNOSIS — E785 Hyperlipidemia, unspecified: Secondary | ICD-10-CM | POA: Diagnosis not present

## 2023-04-11 DIAGNOSIS — Z880 Allergy status to penicillin: Secondary | ICD-10-CM | POA: Diagnosis not present

## 2023-04-11 DIAGNOSIS — Z792 Long term (current) use of antibiotics: Secondary | ICD-10-CM | POA: Diagnosis not present

## 2023-04-11 DIAGNOSIS — L089 Local infection of the skin and subcutaneous tissue, unspecified: Secondary | ICD-10-CM | POA: Diagnosis not present

## 2023-04-11 DIAGNOSIS — F4310 Post-traumatic stress disorder, unspecified: Secondary | ICD-10-CM | POA: Diagnosis not present

## 2023-04-11 DIAGNOSIS — E871 Hypo-osmolality and hyponatremia: Secondary | ICD-10-CM | POA: Diagnosis not present

## 2023-04-11 DIAGNOSIS — I152 Hypertension secondary to endocrine disorders: Secondary | ICD-10-CM | POA: Diagnosis not present

## 2023-04-11 DIAGNOSIS — Z91040 Latex allergy status: Secondary | ICD-10-CM | POA: Diagnosis not present

## 2023-04-12 DIAGNOSIS — G8929 Other chronic pain: Secondary | ICD-10-CM | POA: Diagnosis not present

## 2023-04-12 DIAGNOSIS — E11621 Type 2 diabetes mellitus with foot ulcer: Secondary | ICD-10-CM | POA: Diagnosis not present

## 2023-04-12 DIAGNOSIS — E1159 Type 2 diabetes mellitus with other circulatory complications: Secondary | ICD-10-CM | POA: Diagnosis not present

## 2023-04-12 DIAGNOSIS — E1169 Type 2 diabetes mellitus with other specified complication: Secondary | ICD-10-CM | POA: Diagnosis not present

## 2023-04-12 DIAGNOSIS — L97509 Non-pressure chronic ulcer of other part of unspecified foot with unspecified severity: Secondary | ICD-10-CM | POA: Diagnosis not present

## 2023-04-12 DIAGNOSIS — I152 Hypertension secondary to endocrine disorders: Secondary | ICD-10-CM | POA: Diagnosis not present

## 2023-04-12 DIAGNOSIS — M5441 Lumbago with sciatica, right side: Secondary | ICD-10-CM | POA: Diagnosis not present

## 2023-04-12 DIAGNOSIS — E11628 Type 2 diabetes mellitus with other skin complications: Secondary | ICD-10-CM | POA: Diagnosis not present

## 2023-04-12 DIAGNOSIS — E66811 Obesity, class 1: Secondary | ICD-10-CM | POA: Diagnosis not present

## 2023-04-12 DIAGNOSIS — L0889 Other specified local infections of the skin and subcutaneous tissue: Secondary | ICD-10-CM | POA: Diagnosis not present

## 2023-04-12 DIAGNOSIS — L03116 Cellulitis of left lower limb: Secondary | ICD-10-CM | POA: Diagnosis not present

## 2023-04-12 DIAGNOSIS — E1165 Type 2 diabetes mellitus with hyperglycemia: Secondary | ICD-10-CM | POA: Diagnosis not present

## 2023-04-12 DIAGNOSIS — E785 Hyperlipidemia, unspecified: Secondary | ICD-10-CM | POA: Diagnosis not present

## 2023-04-12 DIAGNOSIS — Z794 Long term (current) use of insulin: Secondary | ICD-10-CM | POA: Diagnosis not present

## 2023-04-12 DIAGNOSIS — M5442 Lumbago with sciatica, left side: Secondary | ICD-10-CM | POA: Diagnosis not present

## 2023-04-12 DIAGNOSIS — L089 Local infection of the skin and subcutaneous tissue, unspecified: Secondary | ICD-10-CM | POA: Diagnosis not present

## 2023-04-12 DIAGNOSIS — L97521 Non-pressure chronic ulcer of other part of left foot limited to breakdown of skin: Secondary | ICD-10-CM | POA: Diagnosis not present

## 2023-04-13 DIAGNOSIS — E11628 Type 2 diabetes mellitus with other skin complications: Secondary | ICD-10-CM | POA: Diagnosis not present

## 2023-04-13 DIAGNOSIS — G8929 Other chronic pain: Secondary | ICD-10-CM | POA: Diagnosis not present

## 2023-04-13 DIAGNOSIS — E11621 Type 2 diabetes mellitus with foot ulcer: Secondary | ICD-10-CM | POA: Diagnosis not present

## 2023-04-13 DIAGNOSIS — E1165 Type 2 diabetes mellitus with hyperglycemia: Secondary | ICD-10-CM | POA: Diagnosis not present

## 2023-04-13 DIAGNOSIS — L97522 Non-pressure chronic ulcer of other part of left foot with fat layer exposed: Secondary | ICD-10-CM | POA: Diagnosis not present

## 2023-04-13 DIAGNOSIS — E1169 Type 2 diabetes mellitus with other specified complication: Secondary | ICD-10-CM | POA: Diagnosis not present

## 2023-04-13 DIAGNOSIS — Z794 Long term (current) use of insulin: Secondary | ICD-10-CM | POA: Diagnosis not present

## 2023-04-13 DIAGNOSIS — L03116 Cellulitis of left lower limb: Secondary | ICD-10-CM | POA: Diagnosis not present

## 2023-04-13 DIAGNOSIS — L97509 Non-pressure chronic ulcer of other part of unspecified foot with unspecified severity: Secondary | ICD-10-CM | POA: Diagnosis not present

## 2023-04-13 DIAGNOSIS — E1159 Type 2 diabetes mellitus with other circulatory complications: Secondary | ICD-10-CM | POA: Diagnosis not present

## 2023-04-13 DIAGNOSIS — M5442 Lumbago with sciatica, left side: Secondary | ICD-10-CM | POA: Diagnosis not present

## 2023-04-13 DIAGNOSIS — I152 Hypertension secondary to endocrine disorders: Secondary | ICD-10-CM | POA: Diagnosis not present

## 2023-04-13 DIAGNOSIS — L02612 Cutaneous abscess of left foot: Secondary | ICD-10-CM | POA: Diagnosis not present

## 2023-04-13 DIAGNOSIS — E785 Hyperlipidemia, unspecified: Secondary | ICD-10-CM | POA: Diagnosis not present

## 2023-04-13 DIAGNOSIS — M5441 Lumbago with sciatica, right side: Secondary | ICD-10-CM | POA: Diagnosis not present

## 2023-04-13 DIAGNOSIS — L089 Local infection of the skin and subcutaneous tissue, unspecified: Secondary | ICD-10-CM | POA: Diagnosis not present

## 2023-04-13 DIAGNOSIS — E66811 Obesity, class 1: Secondary | ICD-10-CM | POA: Diagnosis not present

## 2023-04-13 DIAGNOSIS — L0889 Other specified local infections of the skin and subcutaneous tissue: Secondary | ICD-10-CM | POA: Diagnosis not present

## 2023-04-14 DIAGNOSIS — M7989 Other specified soft tissue disorders: Secondary | ICD-10-CM | POA: Diagnosis not present

## 2023-04-14 DIAGNOSIS — G8929 Other chronic pain: Secondary | ICD-10-CM | POA: Diagnosis not present

## 2023-04-14 DIAGNOSIS — E66811 Obesity, class 1: Secondary | ICD-10-CM | POA: Diagnosis not present

## 2023-04-14 DIAGNOSIS — L97509 Non-pressure chronic ulcer of other part of unspecified foot with unspecified severity: Secondary | ICD-10-CM | POA: Diagnosis not present

## 2023-04-14 DIAGNOSIS — E1159 Type 2 diabetes mellitus with other circulatory complications: Secondary | ICD-10-CM | POA: Diagnosis not present

## 2023-04-14 DIAGNOSIS — M5442 Lumbago with sciatica, left side: Secondary | ICD-10-CM | POA: Diagnosis not present

## 2023-04-14 DIAGNOSIS — L089 Local infection of the skin and subcutaneous tissue, unspecified: Secondary | ICD-10-CM | POA: Diagnosis not present

## 2023-04-14 DIAGNOSIS — E785 Hyperlipidemia, unspecified: Secondary | ICD-10-CM | POA: Diagnosis not present

## 2023-04-14 DIAGNOSIS — E11628 Type 2 diabetes mellitus with other skin complications: Secondary | ICD-10-CM | POA: Diagnosis not present

## 2023-04-14 DIAGNOSIS — I152 Hypertension secondary to endocrine disorders: Secondary | ICD-10-CM | POA: Diagnosis not present

## 2023-04-14 DIAGNOSIS — Z794 Long term (current) use of insulin: Secondary | ICD-10-CM | POA: Diagnosis not present

## 2023-04-14 DIAGNOSIS — E1165 Type 2 diabetes mellitus with hyperglycemia: Secondary | ICD-10-CM | POA: Diagnosis not present

## 2023-04-14 DIAGNOSIS — L03116 Cellulitis of left lower limb: Secondary | ICD-10-CM | POA: Diagnosis not present

## 2023-04-14 DIAGNOSIS — E11621 Type 2 diabetes mellitus with foot ulcer: Secondary | ICD-10-CM | POA: Diagnosis not present

## 2023-04-14 DIAGNOSIS — M5441 Lumbago with sciatica, right side: Secondary | ICD-10-CM | POA: Diagnosis not present

## 2023-04-14 DIAGNOSIS — E1169 Type 2 diabetes mellitus with other specified complication: Secondary | ICD-10-CM | POA: Diagnosis not present

## 2023-04-15 DIAGNOSIS — I152 Hypertension secondary to endocrine disorders: Secondary | ICD-10-CM | POA: Diagnosis not present

## 2023-04-15 DIAGNOSIS — M5442 Lumbago with sciatica, left side: Secondary | ICD-10-CM | POA: Diagnosis not present

## 2023-04-15 DIAGNOSIS — E1169 Type 2 diabetes mellitus with other specified complication: Secondary | ICD-10-CM | POA: Diagnosis not present

## 2023-04-15 DIAGNOSIS — E1159 Type 2 diabetes mellitus with other circulatory complications: Secondary | ICD-10-CM | POA: Diagnosis not present

## 2023-04-15 DIAGNOSIS — E11621 Type 2 diabetes mellitus with foot ulcer: Secondary | ICD-10-CM | POA: Diagnosis not present

## 2023-04-15 DIAGNOSIS — E785 Hyperlipidemia, unspecified: Secondary | ICD-10-CM | POA: Diagnosis not present

## 2023-04-15 DIAGNOSIS — E11628 Type 2 diabetes mellitus with other skin complications: Secondary | ICD-10-CM | POA: Diagnosis not present

## 2023-04-15 DIAGNOSIS — L089 Local infection of the skin and subcutaneous tissue, unspecified: Secondary | ICD-10-CM | POA: Diagnosis not present

## 2023-04-15 DIAGNOSIS — Z794 Long term (current) use of insulin: Secondary | ICD-10-CM | POA: Diagnosis not present

## 2023-04-15 DIAGNOSIS — E1165 Type 2 diabetes mellitus with hyperglycemia: Secondary | ICD-10-CM | POA: Diagnosis not present

## 2023-04-15 DIAGNOSIS — G8929 Other chronic pain: Secondary | ICD-10-CM | POA: Diagnosis not present

## 2023-04-15 DIAGNOSIS — E66811 Obesity, class 1: Secondary | ICD-10-CM | POA: Diagnosis not present

## 2023-04-15 DIAGNOSIS — L97509 Non-pressure chronic ulcer of other part of unspecified foot with unspecified severity: Secondary | ICD-10-CM | POA: Diagnosis not present

## 2023-04-15 DIAGNOSIS — M5441 Lumbago with sciatica, right side: Secondary | ICD-10-CM | POA: Diagnosis not present

## 2023-04-15 DIAGNOSIS — L03116 Cellulitis of left lower limb: Secondary | ICD-10-CM | POA: Diagnosis not present

## 2023-04-17 ENCOUNTER — Encounter: Payer: Self-pay | Admitting: Emergency Medicine

## 2023-04-18 ENCOUNTER — Telehealth: Payer: Self-pay | Admitting: *Deleted

## 2023-04-18 NOTE — Patient Instructions (Signed)
Visit Information  Thank you for taking time to visit with me today. Please don't hesitate to contact me if I can be of assistance to you before our next scheduled telephone appointment.  Our next appointment is by telephone on Tuesday, 04/26/23 at 3:00 pm Please call the care guide team at [phone] if you need to cancel or reschedule your appointment.   Patient Goals/Self-Care Activities: Participate in Transition of Care Program/Attend TOC scheduled calls Take all medications as prescribed Attend all scheduled provider appointments Perform all self care activities independently  Call provider office for new concerns or questions  Continue pacing activity as your recuperation from recent surgery continues Continue monitoring and recording your blood sugars at home twice a day Use assistive devices as needed to prevent falls and limit your weight bearing, as instructed after your hospital visit Continue working with the social work team from [facility] that is involved in helping you obtain temporary medicaid  Following is a copy of your care plan:   Goals Addressed             This Visit's Progress    TOC 30-day Program Care Plan   On track    Current Barriers:  Provider appointments patient reports he was contacted by podiatry surgeon at [facility] during our [facility] call today: he verifies he has contact information for surgeon and will call to scheduled HFU appointment as instructed post-hospital discharge on 04/15/23 Patient awaiting insurance benefits to become active: recently changed jobs; working with CSW team at [employer] on obtaining temporary medicaid- he reports today this is in progress  A1-C: 14 (04/12/23)-- 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  RNCM Clinical Goal(s):  Patient will work with the Care Management team over the next 30 days to address Transition of Care Barriers: Provider appointments Equipment/DME verbalize basic understanding of  DMII disease  process and self health management plan as evidenced by patient reporting during TOC 30-day RN CM outreach take all medications exactly as prescribed and will call provider for medication related questions as evidenced by patient reporting during TOC 30-day RN CM outreach attend all scheduled medical appointments: post-op surgical podiatry provider as evidenced by review of same with patient during [facility] 30-day program RN CM outreach demonstrate Ongoing health management independence as evidenced by patient reporting during [facility] 30-day program RN CM outreach work with CSW team at [employer] to ensure he has temporary medicaid in place while he awaits his current insurance to become active as evidenced by patient reporting of same during [facility] 30-day program RN CM outreach not experience hospital admission as evidenced by review of EMR. Hospital Admissions in last 6 months = 1  through collaboration with RN Care manager, provider, and care team.   Interventions: Evaluation of current treatment plan related to  self management and patient's adherence to plan as established by provider  Transitions of Care:  New goal. 04/18/23 Durable Medical Equipment (DME) needs assessed with patient/caregiver Doctor Visits  - discussed the importance of doctor visits Post discharge activity limitations prescribed by provider reviewed Post-op wound/incision care reviewed with patient/caregiver Reviewed Signs and symptoms of infection Discussed current clinical condition:  "I am doing fine, not having any problems.  No driving restrictions.  The surgeon from [facility] just called me to schedule the hospital follow up visit- I will call him back when we get done.  I am completely independent.  Taking all of the medicine the way they told me  to;"  patient denies specific clinical concerns today Full medication reconciliation/ review completed; self-manages medications and denies questions/ concerns around medications  today  confirmed currently requiring/ using assistive devices for ambulation -- walker for post-op use/ wearing boot for limited weight bearing status Provided education/ reinforcement around benefit of conservative post-op activity; need to pace activity without over-doing  Confirmed patient continues monitoring blood sugars at home: am and pm Reviewed recent blood sugars at home: patient reports today, "they are always between 170-180, any time I check them" Reinforced blood sugar management at home; need to follow carbohydrate modified diet; use of long- acting insulin  Patient Goals/Self-Care Activities: Participate in Transition of Care Program/Attend TOC scheduled calls Take all medications as prescribed Attend all scheduled provider appointments Perform all self care activities independently  Call provider office for new concerns or questions  Continue pacing activity as your recuperation from recent surgery continues Continue monitoring and recording your blood sugars at home twice a day Use assistive devices as needed to prevent falls and limit your weight bearing, as instructed after your hospital visit Continue working with the social work team from [facility] that is involved in helping you obtain temporary medicaid  Follow Up Plan:  Telephone follow up appointment with care management team member scheduled for:  Tuesday 04/26/23 at 3:00 pm         Patient verbalizes understanding of instructions and care plan provided today and agrees to view in MyChart. Active MyChart status and patient understanding of how to access instructions and care plan via MyChart confirmed with patient.     Please if you are experiencing a Mental Health or Behavioral Health Crisis or need someone to talk to.  call the Suicide and Crisis Lifeline: 988 call the [place] National Suicide Prevention Lifeline: 985-512-9905 or TTY: 410-860-2547 TTY 743-454-3249) to talk to a trained counselor call  1-800-273-TALK (toll free, 24 hour hotline) go to [facility] [address], [place] [phone]) call the [facility] Crisis Line: 925-364-7097 call 911   Caryl Pina, RN, BSN, CCRN Alumnus RN Care Manager  Transitions of Care  [facility] - Population Health  [place] [phone]: direct office

## 2023-04-18 NOTE — Telephone Encounter (Signed)
Thanks

## 2023-04-18 NOTE — Transitions of Care (Post Inpatient/ED Visit) (Signed)
04/18/2023  Name: [name] MRN: [id number] DOB: 02-24-1981  Today's TOC FU Call Status: Today's TOC FU Call Status:: Successful TOC FU Call Completed TOC FU Call Complete Date: 04/18/23 Patient's Name and Date of Birth confirmed.  Transition Care Management Follow-up Telephone Call Date of Discharge: 04/15/23 Discharge Facility: Other [facility]) Name of Other ([facility]) Discharge Facility: [facility] Type of Discharge: Inpatient Admission Primary Inpatient Discharge Diagnosis:: (L) Diabetic foot infection/ SIRS/ hyperglycemia; 04/13/23: surgical I-D abscess How have you been since you were released from the hospital?: Better ("I am better and able to do everything on my own; I live by myself and they did not restrict my driving.  I will be calling the surgeon at [facility] for the appointment, today- I just missed their call to me.  I am using the walker and wearing the boot") Any questions or concerns?: Yes Patient Questions/Concerns:: Reports he is in between probation period with his insurance- just changed jobs and does not have insurance yet; reports Child psychotherapist at [employer] is active and assisting him with obtaining temporary medicaid Patient Questions/Concerns Addressed: Other: (encouraged patient to remain active with Child psychotherapist at [facility] who is currently involved to assist with his temporary medicaid approval until his new insurance is in place/ active)  Items Reviewed: Did you receive and understand the discharge instructions provided?: Yes (briefly reviewed with patient who verbalizes good understanding of same - outside hospital AVS) Medications obtained,verified, and reconciled?: Yes (Medications Reviewed) (Full medication reconciliation/ review completed; no concerns or discrepancies identified; confirmed patient obtained/ is taking all newly Rx'd medications as instructed; self-manages medications and denies questions/ concerns around medications  today) Any new allergies since your discharge?: No Dietary orders reviewed?: Yes Type of Diet Ordered:: "I try to eat a diabetic diet but I have got to start doing better" Do you have support at home?: Yes People in Home: alone Name of Support/Comfort Primary Source: Reports lives alone/ independent in self-care activities; supportive local extended assists as/ if needed/ indicated  Medications Reviewed Today: Medications Reviewed Today     Reviewed by Michaela Corner, RN (Registered Nurse) on 04/18/23 at 1041  Med List Status: <None>   Medication Order Taking? Sig Documenting Provider Last Dose Status Informant  acetaminophen (TYLENOL) 500 MG tablet [id number] Yes Take 1,000 mg by mouth every 6 (six) hours as needed for mild pain. [provider] Taking Active Self  amLODipine (NORVASC) 10 MG tablet [id number] Yes TAKE 1 TABLET BY MOUTH EVERY DAY Sagardia, Eilleen Kempf, MD Taking Active   amoxicillin-clavulanate (AUGMENTIN) 875-125 MG tablet [id number] Yes Take 1 tablet by mouth 2 (two) times daily. Georgina Quint, MD Taking Active Self           Med Note Marilu Favre Apr 18, 2023 10:21 AM) 04/18/23: Reports during [facility] call prescribed after discharge from [facility] on 04/15/23  atomoxetine (STRATTERA) 40 MG capsule [id number] Yes Take 1 capsule (40 mg total) by mouth daily. Stasia Cavalier, MD Taking Active   blood glucose meter kit and supplies [id number] Yes Dispense based on patient and insurance preference. Use up to four times daily as directed. (FOR ICD-10 E10.9, E11.9). Rodolph Bong, MD Taking Active            Med Note Alona Bene, CYNTHIA A   Tue Jun 10, 2020  9:14 AM) sometimes  Continuous Blood Gluc Sensor (FREESTYLE LIBRE 3 SENSOR) [place] [id number] No 1 application by Does  not apply route 2 (two) times daily. Place 1 sensor on the skin every 14 days. Use to check glucose continuously  Patient not taking: Reported on 04/18/2023   Georgina Quint, MD Not Taking  Active   cyclobenzaprine (FLEXERIL) 10 MG tablet [id number] Yes TAKE 1 TABLET BY MOUTH THREE TIMES A DAY AS NEEDED FOR MUSCLE SPASM Sagardia, Eilleen Kempf, MD Taking Active   dapagliflozin propanediol (FARXIGA) 10 MG TABS tablet [id number]  Take 1 tablet (10 mg total) by mouth daily before breakfast.  Patient not taking: Reported on 11/09/2022   Georgina Quint, MD  Active            Med Note Marilu Favre Apr 18, 2023 10:28 AM) 06/18/22: Reports during TOC call no longer taking  Dulaglutide (TRULICITY) 4.5 MG/0.5ML SOPN [id number] Yes INJECT 4.5 MG AS DIRECTED ONCE A WEEK. Georgina Quint, MD Taking Active   fluticasone [facility]) 50 MCG/ACT nasal spray [id number] No Place 2 sprays into both nostrils daily.  Patient not taking: Reported on 04/18/2023   Viviano Simas, FNP Not Taking Active   gabapentin (NEURONTIN) 300 MG capsule [id number] Yes Take 1 capsule (300 mg total) by mouth 3 (three) times daily. Georgina Quint, MD Taking Active            Med Note Marilu Favre Apr 18, 2023 10:23 AM) 06/18/22: Reports during TOC call currently taking BID  glipiZIDE (GLUCOTROL) 10 MG tablet [id number] Yes TAKE 1 TABLET (10 MG TOTAL) BY MOUTH TWICE A DAY BEFORE A MEAL Sagardia, Eilleen Kempf, MD Taking Active   HYDROmorphone (DILAUDID) 2 MG tablet [id number] Yes Take 2 mg by mouth every 4 (four) hours as needed for severe pain (pain score 7-10). Georgina Quint, MD Taking Active Self           Med Note Marilu Favre Apr 18, 2023 10:22 AM) 06/18/22: Reports during [facility] call prescribed after discharge from [facility] on 04/15/23  hydrOXYzine (ATARAX) 10 MG tablet [id number] Yes TAKE 1 TABLET (10 MG TOTAL) BY MOUTH AT BEDTIME AS NEEDED FOR ANXIETY Stasia Cavalier, MD Taking Active   insulin glargine (LANTUS SOLOSTAR) 100 UNIT/ML Solostar Pen [id number] Yes Inject 15 Units into the skin daily. Georgina Quint, MD Taking Active            Med Note Marilu Favre Apr 18, 2023 10:26 AM) 06/18/22: Reports during TOC call taking 40 U q HS-- dose changed/ prescribed after discharge from [facility] on 04/15/23  Insulin Pen Needle (PEN NEEDLES) 32G X 4 MM MISC [id number] Yes Use as directed with insulin pen Georgina Quint, MD Taking Active   loratadine (CLARITIN) 10 MG tablet [id number] Yes Take 10 mg by mouth as needed for allergies. [provider] Taking Active   losartan-hydrochlorothiazide [facility]) 100-12.5 MG tablet [id number] Yes TAKE 1 TABLET BY MOUTH EVERY DAY Sagardia, Eilleen Kempf, MD Taking Active   mirtazapine (REMERON) 15 MG tablet [id number] Yes Take 1 tablet (15 mg total) by mouth at bedtime. Stasia Cavalier, MD Taking Active   omeprazole (PRILOSEC) 40 MG capsule [id number] Yes TAKE 1 CAPSULE (40 MG TOTAL) BY MOUTH 2 (TWO) TIMES DAILY BEFORE A MEAL. Iva Boop, MD Taking Active   OXcarbazepine (TRILEPTAL) 150 MG tablet [id number] Yes Take 1 tablet (150 mg total) by mouth 2 (two) times daily. Windell Norfolk, MD Taking Active   rosuvastatin (CRESTOR) 20  MG tablet [id number] Yes TAKE 1 TABLET BY MOUTH EVERY DAY Georgina Quint, MD Taking Active   sildenafil (VIAGRA) 100 MG tablet [id number] Yes TAKE 0.5-1 TABLETS BY MOUTH DAILY AS NEEDED FOR ERECTILE DYSFUNCTION. Georgina Quint, MD Taking Active   tadalafil (CIALIS) 20 MG tablet [id number] Yes Take 0.5-1 tablets (10-20 mg total) by mouth every other day as needed for erectile dysfunction. Georgina Quint, MD Taking Active   Testosterone Enanthate (XYOSTED) 75 MG/0.5ML Ivory Broad [id number] No Inject 75 mg into the skin once a week.  Patient not taking: Reported on 04/18/2023   Georgina Quint, MD Not Taking Active            Med Note Marilu Favre Apr 18, 2023 10:25 AM) 06/18/22: Reports during [facility] call has not started this medication yet  venlafaxine XR (EFFEXOR-XR) 150 MG 24 hr capsule [id number] Yes Take 1 capsule (150 mg total) by mouth daily. Stasia Cavalier, MD Taking Active             Home Care and Equipment/Supplies: Were Home Health Services Ordered?: No Any new equipment or medical supplies ordered?: Yes (walker) Name of Medical supply agency?: [facility] [employer] Were you able to get the equipment/medical supplies?: Yes Do you have any questions related to the use of the equipment/supplies?: No  Functional Questionnaire: Do you need assistance with bathing/showering or dressing?: No (reports bathing restriction until he has post-op visit "this week") Do you need assistance with meal preparation?: No Do you need assistance with eating?: No Do you have difficulty maintaining continence: No Do you need assistance with getting out of bed/getting out of a chair/moving?: No Do you have difficulty managing or taking your medications?: No  Follow up appointments reviewed: PCP Follow-up appointment confirmed?: NA (verified not indicated per hospital discharging provider discharge notes) Specialist Hospital Follow-up appointment confirmed?: No (Patient reports that [facility] surgeon office called him while we were on phone- he verbalizes plans to call them back to schedule HFU, "as soon as we hang up") Reason Specialist Follow-Up Not Confirmed: Patient has Specialist Provider Number and will Call for Appointment Do you need transportation to your follow-up appointment?: No Do you understand care options if your condition(s) worsen?: Yes-patient verbalized understanding  SDOH Interventions Today    Flowsheet Row Most Recent Value  SDOH Interventions   Food Insecurity Interventions Intervention Not Indicated  Housing Interventions Intervention Not Indicated  Transportation Interventions Intervention Not Indicated  [Reports drives self]  Utilities Interventions Intervention Not Indicated  Health Literacy Interventions Intervention Not Indicated       Goals Addressed             This Visit's Progress    TOC 30-day Program Care Plan   On track     Current Barriers:  Provider appointments patient reports he was contacted by podiatry surgeon at [facility] during our [facility] call today: he verifies he has contact information for surgeon and will call to scheduled HFU appointment as instructed post-hospital discharge on 04/15/23 Patient awaiting insurance benefits to become active: recently changed jobs; working with CSW team at [employer] on obtaining temporary medicaid- he reports today this is in progress  A1-C: 14 (04/12/23)-- 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  RNCM Clinical Goal(s):  Patient will work with the Care Management team over the next 30 days to address Transition of Care Barriers: Provider appointments Equipment/DME verbalize basic understanding of  DMII disease process and self  health management plan as evidenced by patient reporting during TOC 30-day RN CM outreach take all medications exactly as prescribed and will call provider for medication related questions as evidenced by patient reporting during TOC 30-day RN CM outreach attend all scheduled medical appointments: post-op surgical podiatry provider as evidenced by review of same with patient during [facility] 30-day program RN CM outreach demonstrate Ongoing health management independence as evidenced by patient reporting during [facility] 30-day program RN CM outreach work with CSW team at [employer] to ensure he has temporary medicaid in place while he awaits his current insurance to become active as evidenced by patient reporting of same during [facility] 30-day program RN CM outreach not experience hospital admission as evidenced by review of EMR. Hospital Admissions in last 6 months = 1  through collaboration with RN Care manager, provider, and care team.   Interventions: Evaluation of current treatment plan related to  self management and patient's adherence to plan as established by provider  Transitions of Care:  New goal. 04/18/23 Durable Medical Equipment (DME)  needs assessed with patient/caregiver Doctor Visits  - discussed the importance of doctor visits Post discharge activity limitations prescribed by provider reviewed Post-op wound/incision care reviewed with patient/caregiver Reviewed Signs and symptoms of infection Discussed current clinical condition:  "I am doing fine, not having any problems.  No driving restrictions.  The surgeon from [facility] just called me to schedule the hospital follow up visit- I will call him back when we get done.  I am completely independent.  Taking all of the medicine the way they told me to;"  patient denies specific clinical concerns today Full medication reconciliation/ review completed; self-manages medications and denies questions/ concerns around medications today  confirmed currently requiring/ using assistive devices for ambulation -- walker for post-op use/ wearing boot for limited weight bearing status Provided education/ reinforcement around benefit of conservative post-op activity; need to pace activity without over-doing  Confirmed patient continues monitoring blood sugars at home: am and pm Reviewed recent blood sugars at home: patient reports today, "they are always between 170-180, any time I check them" Reinforced blood sugar management at home; need to follow carbohydrate modified diet; use of long- acting insulin  Patient Goals/Self-Care Activities: Participate in Transition of Care Program/Attend TOC scheduled calls Take all medications as prescribed Attend all scheduled provider appointments Perform all self care activities independently  Call provider office for new concerns or questions  Continue pacing activity as your recuperation from recent surgery continues Continue monitoring and recording your blood sugars at home twice a day Use assistive devices as needed to prevent falls and limit your weight bearing, as instructed after your hospital visit Continue working with the social work  team from [facility] that is involved in helping you obtain temporary medicaid  Follow Up Plan:  Telephone follow up appointment with care management team member scheduled for:  Tuesday 04/26/23 at 3:00 pm          Caryl Pina, RN, BSN, Media planner  Transitions of Care  [facility] [phone]: direct office

## 2023-04-23 ENCOUNTER — Other Ambulatory Visit (HOSPITAL_COMMUNITY): Payer: Self-pay | Admitting: Psychiatry

## 2023-04-23 DIAGNOSIS — F901 Attention-deficit hyperactivity disorder, predominantly hyperactive type: Secondary | ICD-10-CM

## 2023-04-23 DIAGNOSIS — F321 Major depressive disorder, single episode, moderate: Secondary | ICD-10-CM

## 2023-04-25 ENCOUNTER — Emergency Department (HOSPITAL_BASED_OUTPATIENT_CLINIC_OR_DEPARTMENT_OTHER): Payer: BC Managed Care – PPO

## 2023-04-25 ENCOUNTER — Emergency Department (HOSPITAL_BASED_OUTPATIENT_CLINIC_OR_DEPARTMENT_OTHER)
Admission: EM | Admit: 2023-04-25 | Discharge: 2023-04-26 | Disposition: A | Discharge status: Home / Self Care | Payer: BC Managed Care – PPO | Attending: Emergency Medicine | Admitting: Emergency Medicine

## 2023-04-25 ENCOUNTER — Other Ambulatory Visit: Payer: Self-pay

## 2023-04-25 DIAGNOSIS — Z91040 Latex allergy status: Secondary | ICD-10-CM | POA: Diagnosis not present

## 2023-04-25 DIAGNOSIS — L97521 Non-pressure chronic ulcer of other part of left foot limited to breakdown of skin: Secondary | ICD-10-CM | POA: Insufficient documentation

## 2023-04-25 DIAGNOSIS — M79672 Pain in left foot: Secondary | ICD-10-CM | POA: Diagnosis not present

## 2023-04-25 DIAGNOSIS — M7662 Achilles tendinitis, left leg: Secondary | ICD-10-CM | POA: Diagnosis not present

## 2023-04-25 DIAGNOSIS — L97529 Non-pressure chronic ulcer of other part of left foot with unspecified severity: Secondary | ICD-10-CM | POA: Diagnosis not present

## 2023-04-25 DIAGNOSIS — Z7984 Long term (current) use of oral hypoglycemic drugs: Secondary | ICD-10-CM | POA: Insufficient documentation

## 2023-04-25 DIAGNOSIS — Z794 Long term (current) use of insulin: Secondary | ICD-10-CM | POA: Insufficient documentation

## 2023-04-25 DIAGNOSIS — Z79899 Other long term (current) drug therapy: Secondary | ICD-10-CM | POA: Diagnosis not present

## 2023-04-25 DIAGNOSIS — G8918 Other acute postprocedural pain: Secondary | ICD-10-CM | POA: Diagnosis not present

## 2023-04-25 DIAGNOSIS — E11621 Type 2 diabetes mellitus with foot ulcer: Secondary | ICD-10-CM | POA: Diagnosis not present

## 2023-04-25 DIAGNOSIS — I10 Essential (primary) hypertension: Secondary | ICD-10-CM | POA: Insufficient documentation

## 2023-04-25 DIAGNOSIS — M7989 Other specified soft tissue disorders: Secondary | ICD-10-CM | POA: Diagnosis not present

## 2023-04-25 DIAGNOSIS — M7732 Calcaneal spur, left foot: Secondary | ICD-10-CM | POA: Diagnosis not present

## 2023-04-25 LAB — COMPREHENSIVE METABOLIC PANEL
ALT: 27 U/L (ref 0–44)
AST: 18 U/L (ref 15–41)
Albumin: 4.1 g/dL (ref 3.5–5.0)
Alkaline Phosphatase: 59 U/L (ref 38–126)
Anion gap: 8 (ref 5–15)
BUN: 11 mg/dL (ref 6–20)
CO2: 29 mmol/L (ref 22–32)
Calcium: 9.5 mg/dL (ref 8.9–10.3)
Chloride: 99 mmol/L (ref 98–111)
Creatinine, Ser: 0.98 mg/dL (ref 0.61–1.24)
GFR, Estimated: >60 mL/min (ref >60)
Glucose, Bld: 343 mg/dL — ABNORMAL HIGH (ref 70–99)
Potassium: 4 mmol/L (ref 3.5–5.1)
Sodium: 136 mmol/L (ref 135–145)
Total Bilirubin: 0.4 mg/dL (ref <1.2)
Total Protein: 7.7 g/dL (ref 6.5–8.1)

## 2023-04-25 LAB — CBG MONITORING, ED: Glucose-Capillary: 319 mg/dL — ABNORMAL HIGH (ref 70–99)

## 2023-04-25 LAB — CBC WITH DIFFERENTIAL/PLATELET
Abs Immature Granulocytes: 0.04 10*3/uL (ref 0.00–0.07)
Basophils Absolute: 0 10*3/uL (ref 0.0–0.1)
Basophils Relative: 0 %
Eosinophils Absolute: 0.1 10*3/uL (ref 0.0–0.5)
Eosinophils Relative: 1 %
HCT: 39.2 % (ref 39.0–52.0)
Hemoglobin: 12.9 g/dL — ABNORMAL LOW (ref 13.0–17.0)
Immature Granulocytes: 0 %
Lymphocytes Relative: 29 %
Lymphs Abs: 2.9 10*3/uL (ref 0.7–4.0)
MCH: 26.5 pg (ref 26.0–34.0)
MCHC: 32.9 g/dL (ref 30.0–36.0)
MCV: 80.5 fL (ref 80.0–100.0)
Monocytes Absolute: 0.4 10*3/uL (ref 0.1–1.0)
Monocytes Relative: 4 %
Neutro Abs: 6.6 10*3/uL (ref 1.7–7.7)
Neutrophils Relative %: 66 %
Platelets: 327 10*3/uL (ref 150–400)
RBC: 4.87 MIL/uL (ref 4.22–5.81)
RDW: 13.5 % (ref 11.5–15.5)
WBC: 10.1 10*3/uL (ref 4.0–10.5)
nRBC: 0 % (ref 0.0–0.2)

## 2023-04-25 LAB — LACTIC ACID, PLASMA
Lactic Acid, Venous: 2.3 mmol/L (ref 0.5–1.9)
Lactic Acid, Venous: 2.4 mmol/L (ref 0.5–1.9)

## 2023-04-25 MED ORDER — SODIUM CHLORIDE 0.9 % IV BOLUS
1000.0000 mL | Freq: Once | INTRAVENOUS | Status: AC
  Administered 2023-04-25: 1000 mL via INTRAVENOUS

## 2023-04-25 NOTE — ED Triage Notes (Signed)
Pt POV from home reporting L foot/leg pain that began today.  Surgery on foot due to infected ulcer on 12/4. Called surgeon who is in [place], told to come to ED. Denies fever/chills.

## 2023-04-25 NOTE — ED Provider Notes (Addendum)
[place] EMERGENCY DEPARTMENT AT [facility] Provider Note   CSN: [id number] Arrival date & time: 04/25/23  1929     History  Chief Complaint  Patient presents with   Post-op Problem    [name] is a 42 y.o. male history of poorly controlled diabetes, hypertension With recent admission and discharge for diabetic foot ulcer with abscess present with left foot pain.  Patient states that the pain has been present since he was discharged 2 weeks ago and that the Dilaudid at home is not helping.  Patient denies any drainage or fevers stating still feels foot.  Patient does see podiatry and call them and does have an appointment tomorrow however was referred to ED for further workup.  Patient denies fevers, chest pain, shortness of breath, inability to walk, new onset weakness, paresthesias  Home Medications Prior to Admission medications   Medication Sig Start Date End Date Taking? Authorizing Provider  acetaminophen (TYLENOL) 500 MG tablet Take 1,000 mg by mouth every 6 (six) hours as needed for mild pain.    [provider]  amLODipine (NORVASC) 10 MG tablet TAKE 1 TABLET BY MOUTH EVERY DAY 09/13/22   Georgina Quint, MD  amoxicillin-clavulanate (AUGMENTIN) 875-125 MG tablet Take 1 tablet by mouth 2 (two) times daily.    Georgina Quint, MD  atomoxetine (STRATTERA) 40 MG capsule Take 1 capsule (40 mg total) by mouth daily. 10/11/22 10/11/23  Stasia Cavalier, MD  blood glucose meter kit and supplies Dispense based on patient and insurance preference. Use up to four times daily as directed. (FOR ICD-10 E10.9, E11.9). 01/16/19   Rodolph Bong, MD  Continuous Blood Gluc Sensor (FREESTYLE LIBRE 3 SENSOR) MISC 1 application by Does not apply route 2 (two) times daily. Place 1 sensor on the skin every 14 days. Use to check glucose continuously Patient not taking: Reported on 04/18/2023 04/23/21   Georgina Quint, MD  cyclobenzaprine (FLEXERIL) 10 MG tablet  TAKE 1 TABLET BY MOUTH THREE TIMES A DAY AS NEEDED FOR MUSCLE SPASM 02/03/23   Georgina Quint, MD  dapagliflozin propanediol (FARXIGA) 10 MG TABS tablet Take 1 tablet (10 mg total) by mouth daily before breakfast. Patient not taking: Reported on 11/09/2022 03/08/22   Georgina Quint, MD  Dulaglutide (TRULICITY) 4.5 MG/0.5ML SOPN INJECT 4.5 MG AS DIRECTED ONCE A WEEK. 10/25/22   Georgina Quint, MD  fluticasone (FLONASE) 50 MCG/ACT nasal spray Place 2 sprays into both nostrils daily. Patient not taking: Reported on 04/18/2023 01/14/21   Viviano Simas, FNP  gabapentin (NEURONTIN) 300 MG capsule Take 1 capsule (300 mg total) by mouth 3 (three) times daily. 03/14/23 06/12/23  Georgina Quint, MD  glipiZIDE (GLUCOTROL) 10 MG tablet TAKE 1 TABLET (10 MG TOTAL) BY MOUTH TWICE A DAY BEFORE A MEAL 09/13/22   Sagardia, Eilleen Kempf, MD  HYDROmorphone (DILAUDID) 2 MG tablet Take 2 mg by mouth every 4 (four) hours as needed for severe pain (pain score 7-10).    Georgina Quint, MD  hydrOXYzine (ATARAX) 10 MG tablet TAKE 1 TABLET (10 MG TOTAL) BY MOUTH AT BEDTIME AS NEEDED FOR ANXIETY 09/29/22   Stasia Cavalier, MD  insulin glargine (LANTUS SOLOSTAR) 100 UNIT/ML Solostar Pen Inject 15 Units into the skin daily. 11/09/22   Georgina Quint, MD  Insulin Pen Needle (PEN NEEDLES) 32G X 4 MM MISC Use as directed with insulin pen 01/27/23   Georgina Quint, MD  loratadine (CLARITIN) 10 MG  tablet Take 10 mg by mouth as needed for allergies.    [provider]  losartan-hydrochlorothiazide (HYZAAR) 100-12.5 MG tablet TAKE 1 TABLET BY MOUTH EVERY DAY 09/13/22   Georgina Quint, MD  mirtazapine (REMERON) 15 MG tablet Take 1 tablet (15 mg total) by mouth at bedtime. 10/11/22   Stasia Cavalier, MD  omeprazole (PRILOSEC) 40 MG capsule TAKE 1 CAPSULE (40 MG TOTAL) BY MOUTH 2 (TWO) TIMES DAILY BEFORE A MEAL. 11/10/22   Iva Boop, MD  OXcarbazepine (TRILEPTAL) 150 MG tablet Take 1 tablet  (150 mg total) by mouth 2 (two) times daily. 08/10/22 08/05/23  Windell Norfolk, MD  rosuvastatin (CRESTOR) 20 MG tablet TAKE 1 TABLET BY MOUTH EVERY DAY 09/13/22   Georgina Quint, MD  sildenafil (VIAGRA) 100 MG tablet TAKE 0.5-1 TABLETS BY MOUTH DAILY AS NEEDED FOR ERECTILE DYSFUNCTION. 05/15/22   Georgina Quint, MD  tadalafil (CIALIS) 20 MG tablet Take 0.5-1 tablets (10-20 mg total) by mouth every other day as needed for erectile dysfunction. 03/08/22   Georgina Quint, MD  Testosterone Enanthate (XYOSTED) 75 MG/0.5ML SOAJ Inject 75 mg into the skin once a week. Patient not taking: Reported on 04/18/2023 01/03/23   Georgina Quint, MD  venlafaxine XR (EFFEXOR-XR) 150 MG 24 hr capsule Take 1 capsule (150 mg total) by mouth daily. 09/06/22 09/06/23  Stasia Cavalier, MD      Allergies    Amoxicillin, Penicillins, Metformin and related, Other, Latex, and Zolpidem    Review of Systems   Review of Systems  Physical Exam Updated Vital Signs BP 124/89   Pulse (!) 103   Temp 98.6 F (37 C) (Oral)   Resp 16   Ht 6\' 3"  (1.905 m)   Wt 136.1 kg   SpO2 99%   BMI 37.50 kg/m  Physical Exam Constitutional:      General: He is not in acute distress. Cardiovascular:     Rate and Rhythm: Regular rhythm. Tachycardia present.     Pulses: Normal pulses.  Pulmonary:     Effort: Pulmonary effort is normal. No respiratory distress.     Breath sounds: Normal breath sounds.  Abdominal:     Palpations: Abdomen is soft.     Tenderness: There is no abdominal tenderness. There is no guarding or rebound.  Musculoskeletal:        General: Normal range of motion.     Comments: Soft compartments Pain not out of proportion No bony tenderness or abnormalities Able to wiggle toes, full active range of motion in ankle  Skin:    General: Skin is warm and dry.     Capillary Refill: Capillary refill takes less than 2 seconds.     Comments: Unable to express out any purulent material or palpate  any fluctuance  Neurological:     Mental Status: He is alert and oriented to person, place, and time.     Comments: Sensation intact distally  Psychiatric:        Mood and Affect: Mood normal.     ED Results / Procedures / Treatments   Labs (all labs ordered are listed, but only abnormal results are displayed) Labs Reviewed  COMPREHENSIVE METABOLIC PANEL - Abnormal; Notable for the following components:      Result Value   Glucose, Bld 343 (*)    All other components within normal limits  CBC WITH DIFFERENTIAL/PLATELET - Abnormal; Notable for the following components:   Hemoglobin 12.9 (*)    All  other components within normal limits  LACTIC ACID, PLASMA - Abnormal; Notable for the following components:   Lactic Acid, Venous 2.3 (*)    All other components within normal limits  LACTIC ACID, PLASMA - Abnormal; Notable for the following components:   Lactic Acid, Venous 2.4 (*)    All other components within normal limits  CBG MONITORING, ED - Abnormal; Notable for the following components:   Glucose-Capillary 319 (*)    All other components within normal limits  LACTIC ACID, PLASMA    EKG None  Radiology DG Foot Complete Left Result Date: 04/25/2023 CLINICAL DATA:  Postoperative pain.  Surgery 12/4 EXAM: LEFT FOOT - COMPLETE 3+ VIEW COMPARISON:  None Available. FINDINGS: Erosion with overhanging edges involving the medial first proximal phalanx at the interphalangeal joint. No acute fracture. No bony destructive change. Normal alignment. Small plantar calcaneal spur and Achilles tendon enthesophyte. Soft tissue edema of the forefoot. Probable soft tissue ulcer involving the plantar foot at the metatarsal phalangeal joint, best appreciated on the lateral view. No radiopaque foreign body. IMPRESSION: 1. Erosion with overhanging edges involving the medial first proximal phalanx at the interphalangeal joint, suspicious for gout. 2. Soft tissue edema of the forefoot. Probable soft  tissue ulcer involving the plantar foot. 3. No prior exams available for comparison. Electronically Signed   By: Narda Rutherford M.D.   On: 04/25/2023 23:28    Procedures Procedures    Medications Ordered in ED Medications  sodium chloride 0.9 % bolus 1,000 mL (0 mLs Intravenous Stopped 04/25/23 2300)  sodium chloride 0.9 % bolus 1,000 mL (1,000 mLs Intravenous New Bag/Given 04/25/23 2258)    ED Course/ Medical Decision Making/ A&P                                 Medical Decision Making Amount and/or Complexity of Data Reviewed Labs: ordered. Radiology: ordered.   [name] 42 y.o. presented today for left foot pain. Working DDx that I considered at this time includes, but not limited to, ulcer/abscess, sepsis, osteomyelitis, neurovascular compromise.  R/o DDx: ulcer/abscess, sepsis, osteomyelitis, neurovascular compromise: These are considered less likely due to history of present illness, physical exam, labs/imaging findings  Review of prior external notes: 12-24 discharge summary  Unique Tests and My Interpretation:  CBC: Unremarkable CMP: Hyperglycemia 343 CBG: 319 Lactic acid 2.3, 2.4 Left foot x-ray: Possible bony erosions from gout  Social Determinants of Health: none  Discussion with Independent Historian: None  Discussion of Management of Tests: None  Risk: Low: based on diagnostic testing/clinical impression and treatment plan  Risk Stratification Score: None  Staffed with Pickering, MD  Plan: On exam patient was in no acute distress was tachycardia 116.  Patient does have lactic acid of 2.3 from triage and so we will start him on fluids.  Patient was neuro vas intact however does have wound on the bottom of his left foot that does not show any signs of purulent material and does appear to be healing well.  Patient states he is been taking Dilaudid at home p.o. and has not been helping which prompted him to come in however does not want any stronger  pain meds as he wants to go home later tonight and is only here because podiatry referred him here.  Will obtain x-ray to look for other pathology at this time.  Patient states that he has a podiatry appointment tomorrow and does not  want to stay.  X-ray shows the patient may have gout with bony erosions however the bony erosions are most likely due to recent abscess as patient does not have history of gout and does not have podagra on exam.  Waiting on repeat lactic acid and if downtrending can discharge with podiatry follow-up as patient wants to be discharged.  Patient signed out to [facility], MD.  Please review their note for the continuation of patient's care.  The plan at this point is discharge if lactic acid is downtrending and have him follow-up with his podiatrist tomorrow.  This chart was dictated using voice recognition software.  Despite best efforts to proofread,  errors can occur which can change the documentation meaning.         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

## 2023-04-25 NOTE — Discharge Instructions (Signed)
Please follow-up with your podiatrist tomorrow in regards recent symptoms and ER visit.  Today your labs and imaging were all reassuring and your ulcer appears to be healing well.  If symptoms change or worsen please return to the ER.

## 2023-04-26 ENCOUNTER — Other Ambulatory Visit: Payer: Self-pay | Admitting: *Deleted

## 2023-04-26 DIAGNOSIS — M86172 Other acute osteomyelitis, left ankle and foot: Secondary | ICD-10-CM | POA: Diagnosis not present

## 2023-04-26 DIAGNOSIS — L97522 Non-pressure chronic ulcer of other part of left foot with fat layer exposed: Secondary | ICD-10-CM | POA: Diagnosis not present

## 2023-04-26 DIAGNOSIS — E11621 Type 2 diabetes mellitus with foot ulcer: Secondary | ICD-10-CM | POA: Diagnosis not present

## 2023-04-26 LAB — LACTIC ACID, PLASMA: Lactic Acid, Venous: 1.7 mmol/L (ref 0.5–1.9)

## 2023-04-26 NOTE — ED Notes (Signed)
Foot re-dressed prior to dc

## 2023-04-27 ENCOUNTER — Telehealth: Payer: Self-pay | Admitting: *Deleted

## 2023-04-27 ENCOUNTER — Other Ambulatory Visit (HOSPITAL_COMMUNITY): Payer: Self-pay | Admitting: Psychiatry

## 2023-04-27 DIAGNOSIS — F901 Attention-deficit hyperactivity disorder, predominantly hyperactive type: Secondary | ICD-10-CM

## 2023-04-27 DIAGNOSIS — F321 Major depressive disorder, single episode, moderate: Secondary | ICD-10-CM

## 2023-04-27 NOTE — Patient Outreach (Signed)
  Care Management  Transitions of Care Program Transitions of Care Post-discharge week 2/ day # 9  04/27/2023 Name: [name] MRN: [id number] DOB: [birth year]  Subjective: [name] is a 42 y.o. year old male who is a primary care patient of Alvy Bimler, Eilleen Kempf, MD. The Care Management team was unable to reach the patient by phone to assess and address transitions of care needs.   Called for [facility] 30-day program-- to re-schedule yesterday's telephone visit; left voice message with direct call back information; scheduled call for Friday 04/29/23 at 1:00 pm and left this on patient's voice mail   Plan: Additional outreach attempts will be made to reach the patient enrolled in the [facility] Program (Post Inpatient Visit).  Caryl Pina, RN, BSN, Media planner  Transitions of Care  [facility] - [facility] [phone]: direct office

## 2023-04-29 ENCOUNTER — Other Ambulatory Visit: Payer: Self-pay | Admitting: *Deleted

## 2023-04-29 NOTE — Patient Instructions (Addendum)
Visit Information  Thank you for taking time to visit with me today. Please don't hesitate to contact me if I can be of assistance to you before our next scheduled telephone appointment.  Our next appointment is by telephone on Friday 05/06/23 at 11:00 am  Please call the care guide team at [phone] if you need to cancel or reschedule your appointment.   Following are the goals we discussed today:  Patient Goals/Self-Care Activities: Participate in Transition of Care Program/Attend TOC scheduled calls Take all medications as prescribed Attend all scheduled provider appointments Perform all self care activities independently  Call provider office for new concerns or questions  Continue pacing activity as your recuperation from recent surgery continues Continue monitoring and recording your blood sugars at home twice a day Please read over the attached information about good food choices for Diabetes Use assistive devices as needed to prevent falls and limit your weight bearing, as instructed after your hospital visit Continue working with the social work team from [facility] that is involved in helping you obtain temporary medicaid, and with the social security team you have been talking with  If you are experiencing a Mental Health or Behavioral Health Crisis or need someone to talk to, please  call the Suicide and Crisis Lifeline: 988 call the [place] National Suicide Prevention Lifeline: 905-521-4345 or TTY: 864-565-9366 TTY 901-220-3265) to talk to a trained counselor call 1-800-273-TALK (toll free, 24 hour hotline) go to [facility] [address] [place] [phone]) call the [facility] Crisis Line: 434-486-8027 call 911   Patient verbalizes understanding of instructions and care plan provided today and agrees to view in MyChart. Active MyChart status and patient understanding of how to access instructions and care plan via MyChart  confirmed with patient.     Caryl Pina, RN, BSN, Media planner  Transitions of Care  [facility] - [facility] [phone]: direct office

## 2023-04-29 NOTE — Patient Outreach (Signed)
Care Management  Transitions of Care Program Transitions of Care Post-discharge week 2/ day # 11   04/29/2023 Name: [name] MRN: [id number] DOB: 04-Oct-1980  Subjective: [name] is a 42 y.o. year old male who is a primary care patient of Sagardia, Eilleen Kempf, MD. The Care Management team Engaged with patient Engaged with patient by telephone to assess and address transitions of care needs.   Consent to Services:  Patient was given information about care management services, agreed to services, and gave verbal consent to participate.  Enrolled 04/18/23  Assessment:  "I am doing okay overall, but my blood sugars are high.  They are running 250-300, even though I increased the insulin dosing to 40 U every day.  It's the choices I make.  I love drinking soda-- I will try to decrease it if I can.  I saw the surgeon this week and he said the wound was looking overall very good; the pain is better, and I am using the boot.  Not having to use the walker much, but sometimes.  I spoke with the social security people and they told me that I have active medicaid that is 'good until March of 2026;  I will try to get an appointment with Dr. Alvy Bimler scheduled soon so I can get these blood sugars looked at"    Otherwise denies clinical concerns/ sounds to be in no distress during [facility] 30-day program outreach call          SDOH Interventions    Flowsheet Row Telephone from 04/18/2023 in [place] [facility] Office Visit from 11/09/2022 in [facility] Office Visit from 03/08/2022 in [facility] [place] [facility] [place] Office Visit from 09/08/2021 in [facility] [place] [facility] [place]  SDOH Interventions      Food Insecurity Interventions Intervention Not Indicated -- -- --  Housing Interventions Intervention Not Indicated -- -- --  Transportation Interventions Intervention Not Indicated  [Reports drives self] -- -- --   Utilities Interventions Intervention Not Indicated -- -- --  Depression Interventions/Treatment  -- Counseling, Currently on Treatment, Referral to Psychiatry Counseling, Currently on Treatment Medication  Health Literacy Interventions Intervention Not Indicated -- -- --        Goals Addressed             This Visit's Progress    TOC 30-day Program Care Plan   On track    Current Barriers:  Provider appointments patient reports he was contacted by podiatry surgeon at [facility] during our [facility] call today: he verifies he has contact information for surgeon and will call to scheduled HFU appointment as instructed post-hospital discharge on 04/15/23 Patient awaiting insurance benefits to become active: recently changed jobs; working with CSW team at [employer] on obtaining temporary medicaid- he reports today this is in progress  A1-C: 14 (04/12/23)-- 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  RNCM Clinical Goal(s):  Patient will work with the Care Management team over the next 30 days to address Transition of Care Barriers: Provider appointments Equipment/DME verbalize basic understanding of  DMII disease process and self health management plan as evidenced by patient reporting during [facility] 30-day RN CM outreach take all medications exactly as prescribed and will call provider for medication related questions as evidenced by patient reporting during [facility] 30-day RN CM outreach attend all scheduled medical appointments: post-op surgical podiatry provider as evidenced by review of same with patient  during [facility] 30-day program RN CM outreach demonstrate Ongoing health management independence as evidenced by patient reporting during [facility] 30-day program RN CM outreach work with CSW team at [facility] to ensure he has temporary medicaid in place while he awaits his current insurance to become active as evidenced by patient reporting of same during [facility] 30-day program RN CM outreach not  experience hospital admission as evidenced by review of EMR. Hospital Admissions in last 6 months = 1  through collaboration with RN Care manager, provider, and care team.   Interventions: Evaluation of current treatment plan related to  self management and patient's adherence to plan as established by provider  Transitions of Care:  Goal on track:  Yes. 04/29/23 Durable Medical Equipment (DME) needs assessed with patient/caregiver Doctor Visits  - discussed the importance of doctor visits Post discharge activity limitations prescribed by provider reviewed Post-op wound/incision care reviewed with patient/caregiver Reviewed Signs and symptoms of infection Discussed current clinical condition:  "I am doing okay overall, but my blood sugars are high.  They are running 250-300, even though I increased the insulin dosing to 40 U every day.  It's the choices I make.  I love drinking soda-- I will try to decrease it if I can.  I saw the surgeon this week and he said the wound was looking overall very good; the pain is better, and I am using the boot.  Not having to use the walker much, but sometimes.  I spoke with the social security people and they told me that I have active medicaid that is 'good until March of 2026;  I will try to get an appointment with Dr. Alvy Bimler scheduled soon so I can get these blood sugars looked at"  Otherwise denies clinical concerns Reviewed ED visit 04/25/23: he reports "I only went to the ED because when I called the surgeon about my increased pain after the surgery- he told me to go; I didn't want to go, but since he told me to-- I went" Reviewed 04/26/23 post-op office visit with podiatry surgeon at [facility]: he reports "I was told it is healing well... he had no concerns.  I am not doing any kind of specific wound care or bandage changes at home-- he told me not to; I go back for a re-check on 05/09/23" Pain assessment completed Confirmed currently continues requiring/  using assistive devices for ambulation -- walker for post-op use-- now using prn/ wearing boot for limited weight bearing status Provided education/ reinforcement around benefit of conservative post-op activity; need to pace activity without over-doing  Confirmed patient continues monitoring blood sugars at home: am and pm Reviewed recent blood sugars at home: patient reports today, "they are always between 250-300, any time I check them;"  this represents a drastic increase since out last conversation- patient reports it is the choices he makes- admits he eats high carbohydrate diet which include drinking regular sodas Reinforced blood sugar management at home; need to follow carbohydrate modified diet; use of long- acting insulin Provided education around basic pathophysiology of DM, including: long term complications of high blood sugar levels over time;  simple strategies to decrease carbohydrate and sugar intake/ eliminate intake of sodas increase protein intake for promotion of wound healing encouraged patient to begin making dietary changes to reduce blood sugar levels- he verbalizes receptivity and states he will "try" to begin doing" Encouraged patient to schedule with PCP for blood sugar review/ possible referral to endocrinology-- he states he  will do  Patient Goals/Self-Care Activities: Participate in Transition of Care Program/Attend TOC scheduled calls Take all medications as prescribed Attend all scheduled provider appointments Perform all self care activities independently  Call provider office for new concerns or questions  Continue pacing activity as your recuperation from recent surgery continues Continue monitoring and recording your blood sugars at home twice a day Please read over the attached information about good food choices for Diabetes Use assistive devices as needed to prevent falls and limit your weight bearing, as instructed after your hospital visit Continue  working with the social work team from [facility] that is involved in helping you obtain temporary medicaid, and with the social security team you have been talking with  Follow Up Plan:  Telephone follow up appointment with care management team member scheduled for:  Friday 05/06/23 at 11:00 am          Plan: Telephone follow up appointment with care management team member scheduled for:  Friday 05/06/23 at 11:00 am  Caryl Pina, RN, BSN, Media planner  Transitions of Care  [facility] - [facility] [phone]: direct office

## 2023-05-06 ENCOUNTER — Other Ambulatory Visit: Payer: Self-pay | Admitting: *Deleted

## 2023-05-06 NOTE — Patient Outreach (Addendum)
  Care Management  Transitions of Care Program Transitions of Care Post-discharge week 3/ day # 18  05/06/2023 Name: [name] MRN: [id number] DOB: 1980/06/20  Subjective: [name] is a 42 y.o. year old male who is a primary care patient of Alvy Bimler, Eilleen Kempf, MD. The Care Management team was unable to reach the patient by phone to assess and address transitions of care needs.   Attempted outreach to patient for scheduled TOC 30-day program outreach call; unsuccessful attempt # 1 - left HIPAA compliant voice message requesting call back  Plan: Additional outreach attempts will be made to reach the patient enrolled in the [facility] Program (Post Inpatient Visit).  Caryl Pina, RN, BSN, Media planner  Transitions of Care  [facility] - [facility] [phone]: direct office

## 2023-05-09 ENCOUNTER — Telehealth: Payer: Self-pay | Admitting: *Deleted

## 2023-05-09 NOTE — Patient Outreach (Signed)
  Care Management  Transitions of Care Program Transitions of Care Post-discharge week 4/ day # 21  05/09/2023 Name: PELHAM SAILE MRN: [id number] DOB: 1980/07/30  Subjective: SANTO HITT is a 42 y.o. year old male who is a primary care patient of Alvy Bimler, Eilleen Kempf, MD. The Care Management team was unable to reach the patient by phone to assess and address transitions of care needs.   Attempted outreach to patient for scheduled TOC 30-day program outreach call; unsuccessful attempt # 2; left HIPAA compliant voice message requesting call back  Plan: Additional outreach attempts will be made to reach the patient enrolled in the [facility] Program (Post Inpatient/ED Visit).  Caryl Pina, RN, BSN, Media planner  Transitions of Care  VBCI - [facility] [phone]: direct office

## 2023-05-10 ENCOUNTER — Telehealth: Payer: Self-pay | Admitting: *Deleted

## 2023-05-10 NOTE — Patient Outreach (Signed)
 Care Management  Transitions of Care Program Transitions of Care Post-discharge week 4/ day # 22- unsuccessful consecutive outreach attempt # 3 without patient call-back TOC 30-day program case closure  05/10/2023 Name: [name] MRN: [id number] DOB: [birth year]  Subjective: [name] is a 42 y.o. year old male who is a primary care patient of Sagardia, Emil Schanz, MD. The Care Management team was unable to reach the patient by phone to assess and address transitions of care needs.   05/10/23: Attempted outreach to patient for scheduled TOC 30-day program outreach call; unsuccessful attempt # 3- left HIPAA compliant voice message requesting call back/ case closure- unable to maintain contact with patient  Plan: No further outreach attempts will be made at this time.  We have been unable to reach the patient.   Goals Addressed             This Visit's Progress    TOC 30-day Program Care Plan       Current Barriers:  Provider appointments patient reports he was contacted by podiatry surgeon at [facility] during our [facility] call today: he verifies he has contact information for surgeon and will call to scheduled HFU appointment as instructed post-hospital discharge on 04/15/23 Patient awaiting insurance benefits to become active: recently changed jobs; working with CSW team at [employer] on obtaining temporary medicaid- he reports today this is in progress  A1-C: 14 (04/12/23)-- 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  RNCM Clinical Goal(s):  Patient will work with the Care Management team over the next 30 days to address Transition of Care Barriers: Provider appointments Equipment/DME verbalize basic understanding of  DMII disease process and self health management plan as evidenced by patient reporting during TOC 30-day RN CM outreach take all medications exactly as prescribed and will call provider for medication related questions as evidenced by patient reporting  during TOC 30-day RN CM outreach attend all scheduled medical appointments: post-op surgical podiatry provider as evidenced by review of same with patient during [facility] 30-day program RN CM outreach demonstrate Ongoing health management independence as evidenced by patient reporting during [facility] 30-day program RN CM outreach work with CSW team at [employer] to ensure he has temporary medicaid in place while he awaits his current insurance to become active as evidenced by patient reporting of same during [facility] 30-day program RN CM outreach not experience hospital admission as evidenced by review of EMR. Hospital Admissions in last 6 months = 1  through collaboration with RN Care manager, provider, and care team.   05/06/23: Attempted outreach to patient for scheduled TOC 30-day program outreach call; unsuccessful attempt # 1- left HIPAA compliant voice message requesting call back 05/09/23: Attempted outreach to patient for scheduled TOC 30-day program outreach call; unsuccessful attempt # 2- left HIPAA compliant voice message requesting call back 05/10/23: Attempted outreach to patient for scheduled TOC 30-day program outreach call; unsuccessful attempt # 3- left HIPAA compliant voice message requesting call back/ case closure- unable to maintain contact with patient  Interventions: Evaluation of current treatment plan related to  self management and patient's adherence to plan as established by provider  Transitions of Care:   Case closure- 3 consecutive unsuccessful attempts have been made to contact patient, without call back  05/10/23  Notes from last successful outreach on 04/29/23: Durable Medical Equipment (DME) needs assessed with patient/caregiver Doctor Visits  - discussed the importance of doctor visits Post discharge activity limitations prescribed by provider reviewed Post-op  wound/incision care reviewed with patient/caregiver Reviewed Signs and symptoms of infection Discussed  current clinical condition:  I am doing okay overall, but my blood sugars are high.  They are running 250-300, even though I increased the insulin  dosing to 40 U every day.  It's the choices I make.  I love drinking soda-- I will try to decrease it if I can.  I saw the surgeon this week and he said the wound was looking overall very good; the pain is better, and I am using the boot.  Not having to use the walker much, but sometimes.  I spoke with the social security people and they told me that I have active medicaid that is 'good until March of 2026;  I will try to get an appointment with Dr. Purcell scheduled soon so I can get these blood sugars looked at  Otherwise denies clinical concerns Reviewed ED visit 04/25/23: he reports I only went to the ED because when I called the surgeon about my increased pain after the surgery- he told me to go; I didn't want to go, but since he told me to-- I went Reviewed 04/26/23 post-op office visit with podiatry surgeon at [facility]: he reports I was told it is healing well... he had no concerns.  I am not doing any kind of specific wound care or bandage changes at home-- he told me not to; I go back for a re-check on 05/09/23 Pain assessment completed Confirmed currently continues requiring/ using assistive devices for ambulation -- walker for post-op use-- now using prn/ wearing boot for limited weight bearing status Provided education/ reinforcement around benefit of conservative post-op activity; need to pace activity without over-doing  Confirmed patient continues monitoring blood sugars at home: am and pm Reviewed recent blood sugars at home: patient reports today, they are always between 250-300, any time I check them;  this represents a drastic increase since out last conversation- patient reports it is the choices he makes- admits he eats high carbohydrate diet which include drinking regular sodas Reinforced blood sugar management at home; need to  follow carbohydrate modified diet; use of long- acting insulin  Provided education around basic pathophysiology of DM, including: long term complications of high blood sugar levels over time;  simple strategies to decrease carbohydrate and sugar intake/ eliminate intake of sodas increase protein intake for promotion of wound healing encouraged patient to begin making dietary changes to reduce blood sugar levels- he verbalizes receptivity and states he will try to begin doing Encouraged patient to schedule with PCP for blood sugar review/ possible referral to endocrinology-- he states he will do  Patient Goals/Self-Care Activities: Participate in Transition of Care Program/Attend TOC scheduled calls Take all medications as prescribed Attend all scheduled provider appointments Perform all self care activities independently  Call provider office for new concerns or questions  Continue pacing activity as your recuperation from recent surgery continues Continue monitoring and recording your blood sugars at home twice a day Please read over the attached information about good food choices for Diabetes Use assistive devices as needed to prevent falls and limit your weight bearing, as instructed after your hospital visit Continue working with the social work team from [facility] that is involved in helping you obtain temporary medicaid, and with the social security team you have been talking with  Follow Up Plan:  Telephone follow up appointment with care management team member scheduled for:  Friday 05/06/23 at 11:00 am  Jaquon Gingerich Mckinney Lonette Stevison, RN, BSN, Media Planner  Transitions of Care  VBCI - [facility] [phone]: direct office

## 2023-06-01 ENCOUNTER — Other Ambulatory Visit (HOSPITAL_COMMUNITY): Payer: Self-pay

## 2023-06-01 ENCOUNTER — Telehealth: Payer: Self-pay

## 2023-06-01 NOTE — Telephone Encounter (Signed)
CMM key: [id number]

## 2023-06-01 NOTE — Telephone Encounter (Signed)
*  Primary  Pharmacy Patient Advocate Encounter   Received notification from Fax that prior authorization for Trulicity 4.5MG /0.5ML auto-injectors  is required/requested.   Insurance verification completed.   The patient is insured through [facility] .   Per test claim: PA required; However, NEW/RECENT labs/notes are needed to complete & submit PA request. Please see below.  Has the patient improved while on the requested drug with documentation provided? NOTE: Physical documentation must be provided.*

## 2023-06-09 DIAGNOSIS — L89892 Pressure ulcer of other site, stage 2: Secondary | ICD-10-CM | POA: Diagnosis not present

## 2023-06-09 DIAGNOSIS — M86172 Other acute osteomyelitis, left ankle and foot: Secondary | ICD-10-CM | POA: Diagnosis not present

## 2023-06-09 DIAGNOSIS — L0889 Other specified local infections of the skin and subcutaneous tissue: Secondary | ICD-10-CM | POA: Diagnosis not present

## 2023-06-09 DIAGNOSIS — E11621 Type 2 diabetes mellitus with foot ulcer: Secondary | ICD-10-CM | POA: Diagnosis not present

## 2023-06-13 ENCOUNTER — Ambulatory Visit: Payer: Medicaid Other | Admitting: Emergency Medicine

## 2023-06-14 ENCOUNTER — Other Ambulatory Visit (HOSPITAL_COMMUNITY): Payer: Self-pay

## 2023-06-23 ENCOUNTER — Other Ambulatory Visit (HOSPITAL_COMMUNITY): Payer: Self-pay

## 2023-06-24 ENCOUNTER — Other Ambulatory Visit (HOSPITAL_COMMUNITY): Payer: Self-pay | Admitting: Psychiatry

## 2023-06-24 ENCOUNTER — Encounter: Payer: Self-pay | Admitting: Emergency Medicine

## 2023-06-24 DIAGNOSIS — F321 Major depressive disorder, single episode, moderate: Secondary | ICD-10-CM

## 2023-06-24 DIAGNOSIS — F901 Attention-deficit hyperactivity disorder, predominantly hyperactive type: Secondary | ICD-10-CM

## 2023-06-27 ENCOUNTER — Other Ambulatory Visit: Payer: Self-pay | Admitting: Emergency Medicine

## 2023-06-27 DIAGNOSIS — M5090 Cervical disc disorder, unspecified, unspecified cervical region: Secondary | ICD-10-CM

## 2023-06-27 DIAGNOSIS — E291 Testicular hypofunction: Secondary | ICD-10-CM

## 2023-06-27 DIAGNOSIS — M5412 Radiculopathy, cervical region: Secondary | ICD-10-CM

## 2023-06-27 MED ORDER — CYCLOBENZAPRINE HCL 10 MG PO TABS: 10.0000 mg | ORAL_TABLET | Freq: Three times a day (TID) | ORAL | 1 refills | Status: DC | PRN

## 2023-06-27 MED ORDER — GABAPENTIN 300 MG PO CAPS: 300.0000 mg | ORAL_CAPSULE | Freq: Three times a day (TID) | ORAL | 0 refills | Status: DC

## 2023-06-27 MED ORDER — XYOSTED 75 MG/0.5ML ~~LOC~~ SOAJ: 75.0000 mg | SUBCUTANEOUS | 5 refills | Status: DC

## 2023-06-27 NOTE — Telephone Encounter (Signed)
 New prescription sent to pharmacy of record today.  Thanks.

## 2023-07-07 DIAGNOSIS — L0889 Other specified local infections of the skin and subcutaneous tissue: Secondary | ICD-10-CM | POA: Diagnosis not present

## 2023-07-07 DIAGNOSIS — S90822A Blister (nonthermal), left foot, initial encounter: Secondary | ICD-10-CM | POA: Diagnosis not present

## 2023-07-07 DIAGNOSIS — E11621 Type 2 diabetes mellitus with foot ulcer: Secondary | ICD-10-CM | POA: Diagnosis not present

## 2023-07-08 DIAGNOSIS — L0889 Other specified local infections of the skin and subcutaneous tissue: Secondary | ICD-10-CM | POA: Diagnosis not present

## 2023-07-11 ENCOUNTER — Ambulatory Visit: Payer: Medicaid Other | Admitting: Emergency Medicine

## 2023-07-11 ENCOUNTER — Encounter: Payer: Self-pay | Admitting: Emergency Medicine

## 2023-07-11 VITALS — BP 112/80 | HR 116 | Temp 98.4°F | Ht 75.0 in | Wt 309.0 lb

## 2023-07-11 DIAGNOSIS — E11621 Type 2 diabetes mellitus with foot ulcer: Secondary | ICD-10-CM | POA: Diagnosis not present

## 2023-07-11 DIAGNOSIS — L97421 Non-pressure chronic ulcer of left heel and midfoot limited to breakdown of skin: Secondary | ICD-10-CM

## 2023-07-11 DIAGNOSIS — E1165 Type 2 diabetes mellitus with hyperglycemia: Secondary | ICD-10-CM | POA: Diagnosis not present

## 2023-07-11 DIAGNOSIS — E1169 Type 2 diabetes mellitus with other specified complication: Secondary | ICD-10-CM | POA: Diagnosis not present

## 2023-07-11 DIAGNOSIS — E785 Hyperlipidemia, unspecified: Secondary | ICD-10-CM

## 2023-07-11 DIAGNOSIS — Z7985 Long-term (current) use of injectable non-insulin antidiabetic drugs: Secondary | ICD-10-CM

## 2023-07-11 DIAGNOSIS — I152 Hypertension secondary to endocrine disorders: Secondary | ICD-10-CM | POA: Diagnosis not present

## 2023-07-11 DIAGNOSIS — E1159 Type 2 diabetes mellitus with other circulatory complications: Secondary | ICD-10-CM | POA: Diagnosis not present

## 2023-07-11 DIAGNOSIS — Z7984 Long term (current) use of oral hypoglycemic drugs: Secondary | ICD-10-CM

## 2023-07-11 LAB — POCT GLYCOSYLATED HEMOGLOBIN (HGB A1C): Hemoglobin A1C: 12.4 % — AB (ref 4.0–5.6)

## 2023-07-11 MED ORDER — EMPAGLIFLOZIN 10 MG PO TABS: 10.0000 mg | ORAL_TABLET | Freq: Every day | ORAL | 3 refills | Status: DC

## 2023-07-11 MED ORDER — TRULICITY 1.5 MG/0.5ML ~~LOC~~ SOAJ: 1.5000 mg | SUBCUTANEOUS | 5 refills | Status: DC

## 2023-07-11 NOTE — Patient Instructions (Signed)

## 2023-07-11 NOTE — Assessment & Plan Note (Addendum)
 BP Readings from Last 3 Encounters:  07/11/23 112/80  04/26/23 (!) 137/90  11/09/22 118/64   Lab Results  Component Value Date   HGBA1C 12.4 (A) 07/11/2023   Walk control hypertension Continue amlodipine 10 mg daily and Hyzaar 100-12.5 mg daily Uncontrolled diabetes with hemoglobin A1c at 12.4 Only taking glipizide and insulin Continue glipizide 10 mg twice a day and Lantus insulin 40 units daily Restart Trulicity at 1.5 mg weekly.  Has been off Trulicity for the past 4 months at least Restart Jardiance 10 mg daily Cardiovascular risks associated with uncontrolled diabetes discussed Diet and nutrition discussed Needs endocrinology evaluation.  Referral placed today We will follow-up after that

## 2023-07-11 NOTE — Progress Notes (Signed)
 [name] 43 y.o.   Chief Complaint  Patient presents with   Follow-up    Patient here for f/u for DM. And discuss testosterone medication. Patient had a left foot injury in Dec 2024 that lead to sepsis. Patient states last Wednesday his wound site was bleeding and a blister bursted. He did start abx yesterday. Would like his foot looked at and  rewrapped     HISTORY OF PRESENT ILLNESS: This is a 43 y.o. male here for follow-up of diabetes and hypertension Last office visit July 2024.  Did not follow-up as recommended Developed foot ulcer and sepsis last December and was hospitalized for this Took Trulicity last about 4 months ago.  Not taking [place].  Only taking glipizide and insulin No other complaints or medical concerns today  HPI   Prior to Admission medications   Medication Sig Start Date End Date Taking? Authorizing Provider  amLODipine (NORVASC) 10 MG tablet TAKE 1 TABLET BY MOUTH EVERY DAY 09/13/22  Yes Deosha Werden, Eilleen Kempf, MD  atomoxetine (STRATTERA) 40 MG capsule Take 1 capsule (40 mg total) by mouth daily. 10/11/22 10/11/23 Yes Stasia Cavalier, MD  blood glucose meter kit and supplies Dispense based on patient and insurance preference. Use up to four times daily as directed. (FOR ICD-10 E10.9, E11.9). 01/16/19  Yes Rodolph Bong, MD  cyclobenzaprine (FLEXERIL) 10 MG tablet Take 1 tablet (10 mg total) by mouth 3 (three) times daily as needed for muscle spasms. 06/27/23  Yes Kacee Koren, Eilleen Kempf, MD  Dulaglutide (TRULICITY) 1.5 MG/0.5ML SOAJ Inject 1.5 mg into the skin once a week. 07/11/23  Yes Amarion Portell, Eilleen Kempf, MD  empagliflozin (JARDIANCE) 10 MG TABS tablet Take 1 tablet (10 mg total) by mouth daily before breakfast. 07/11/23  Yes Ura Hausen, Eilleen Kempf, MD  gabapentin (NEURONTIN) 300 MG capsule Take 1 capsule (300 mg total) by mouth 3 (three) times daily. 06/27/23 09/25/23 Yes Ahana Najera, Eilleen Kempf, MD  glipiZIDE (GLUCOTROL) 10 MG tablet TAKE 1 TABLET (10 MG TOTAL) BY  MOUTH TWICE A DAY BEFORE A MEAL 09/13/22  Yes Georgina Quint, MD  hydrOXYzine (ATARAX) 10 MG tablet TAKE 1 TABLET (10 MG TOTAL) BY MOUTH AT BEDTIME AS NEEDED FOR ANXIETY 09/29/22  Yes Stasia Cavalier, MD  insulin glargine (LANTUS SOLOSTAR) 100 UNIT/ML Solostar Pen Inject 15 Units into the skin daily. 11/09/22  Yes Adilenne Ashworth, Eilleen Kempf, MD  Insulin Pen Needle (PEN NEEDLES) 32G X 4 MM MISC Use as directed with insulin pen 01/27/23  Yes Chyla Schlender, Eilleen Kempf, MD  loratadine (CLARITIN) 10 MG tablet Take 10 mg by mouth as needed for allergies.   Yes [provider]  losartan-hydrochlorothiazide (HYZAAR) 100-12.5 MG tablet TAKE 1 TABLET BY MOUTH EVERY DAY 09/13/22  Yes Isaid Salvia, Eilleen Kempf, MD  mirtazapine (REMERON) 15 MG tablet Take 1 tablet (15 mg total) by mouth at bedtime. 10/11/22  Yes Stasia Cavalier, MD  omeprazole (PRILOSEC) 40 MG capsule TAKE 1 CAPSULE (40 MG TOTAL) BY MOUTH 2 (TWO) TIMES DAILY BEFORE A MEAL. 11/10/22  Yes Iva Boop, MD  OXcarbazepine (TRILEPTAL) 150 MG tablet Take 1 tablet (150 mg total) by mouth 2 (two) times daily. 08/10/22 08/05/23 Yes Camara, Amalia Hailey, MD  rosuvastatin (CRESTOR) 20 MG tablet TAKE 1 TABLET BY MOUTH EVERY DAY 09/13/22  Yes Tegan Burnside, Eilleen Kempf, MD  sildenafil (VIAGRA) 100 MG tablet TAKE 0.5-1 TABLETS BY MOUTH DAILY AS NEEDED FOR ERECTILE DYSFUNCTION. 05/15/22  Yes Georgina Quint, MD  tadalafil (CIALIS) 20 MG  tablet Take 0.5-1 tablets (10-20 mg total) by mouth every other day as needed for erectile dysfunction. 03/08/22  Yes Aljean Horiuchi, Eilleen Kempf, MD  Testosterone Enanthate (XYOSTED) 75 MG/0.5ML SOAJ Inject 75 mg into the skin once a week. 06/27/23  Yes Georgina Quint, MD  venlafaxine XR (EFFEXOR-XR) 150 MG 24 hr capsule Take 1 capsule (150 mg total) by mouth daily. 09/06/22 09/06/23 Yes Stasia Cavalier, MD  acetaminophen (TYLENOL) 500 MG tablet Take 1,000 mg by mouth every 6 (six) hours as needed for mild pain.    [provider]   Continuous Blood Gluc Sensor (FREESTYLE LIBRE 3 SENSOR) MISC 1 application by Does not apply route 2 (two) times daily. Place 1 sensor on the skin every 14 days. Use to check glucose continuously Patient not taking: Reported on 07/11/2023 04/23/21   Georgina Quint, MD    Allergies  Allergen Reactions   Amoxicillin Anaphylaxis and Other (See Comments)    Has patient had a PCN reaction causing immediate rash, facial/tongue/throat swelling, SOB or lightheadedness with hypotension: yes Has patient had a PCN reaction causing severe rash involving mucus membranes or skin necrosis: no Has patient had a PCN reaction that required hospitalization yes Has patient had a PCN reaction occurring within the last 10 years: yes If all of the above answers are "NO", then may proceed with Cephalosporin use.   Penicillins Anaphylaxis and Other (See Comments)    Patient was told to list this as an allergy because he is allergic to Amoxicillin Has patient had a PCN reaction causing immediate rash, facial/tongue/throat swelling, SOB or lightheadedness with hypotension: yes for Amoxicillin Has patient had a PCN reaction causing severe rash involving mucus membranes or skin necrosis: no Has patient had a PCN reaction that required hospitalization yes for Amoxicillin Has patient had a PCN reaction occurring within the last 10 years: yes for Amoxicillin If all o   Metformin And Related Other (See Comments)    SEVERE GI UPSET   Other Other (See Comments)    Powder in some gloves - localized itching but not allergic to latex, benadryl usually helps with this reaction   Latex Itching   Zolpidem Other (See Comments)    Passed out. 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) 02/07/2020   Morbid obesity, unspecified obesity type (HCC) 01/10/2020   Hypertension associated with diabetes (HCC) 04/17/2018   Dyslipidemia 04/17/2018   PTSD (post-traumatic stress disorder) 08/11/2016   Spinal stenosis, lumbar region with neurogenic claudication 05/05/2016   Dysthymia 09/02/2015   Chronic low back pain 09/02/2015   Dyslipidemia associated with type 2 diabetes mellitus (HCC) 03/11/2008   UNSPECIFIED ANEMIA 03/11/2008    Past Medical History:  Diagnosis Date   Allergies    Allergy    Anemia    Anxiety    Depression    Diabetes mellitus    GERD (gastroesophageal reflux disease)    Hyperlipidemia    Hypertension    Lumbar disc herniation    Neuromuscular disorder (HCC)    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    PTSD (post-traumatic stress disorder)    Scoliosis  Sleep apnea     Past Surgical History:  Procedure Laterality Date   LUMBAR LAMINECTOMY/DECOMPRESSION MICRODISCECTOMY Left 05/05/2016   Procedure: CENTRAL DECOMPRESSION L4-L5 AND FORAMINOTOMY FOR L4 ROOT AND L5 ROOT ON THE LEFT;  Surgeon: Ranee Gosselin, MD;  Location: [facility];  Service: Orthopedics;  Laterality: Left;   NO PAST SURGERIES      Social History   Socioeconomic History   Marital status: Divorced    Spouse name: Not on file   Number of children: Not on file   Years of education: Not on file   Highest education level: Associate degree: occupational, Scientist, product/process development, or vocational program  Occupational History   Not on file  Tobacco Use   Smoking status: Former    Types: Cigars   Smokeless tobacco: Current    Types: Snuff  Vaping Use   Vaping status: Never Used  Substance and Sexual Activity   Alcohol use: Yes    Alcohol/week: 5.0 standard drinks of alcohol    Types: 5 Shots of liquor per week    Comment: few times year   Drug use:  No   Sexual activity: Not on file  Other Topics Concern   Not on file  Social History Narrative   Not on file   Social Drivers of Health   Financial Resource Strain: High Risk (07/07/2023)   Overall Financial Resource Strain (CARDIA)    Difficulty of Paying Living Expenses: Hard  Food Insecurity: Food Insecurity Present (07/07/2023)   Hunger Vital Sign    Worried About Running Out of Food in the Last Year: Sometimes true    Ran Out of Food in the Last Year: Sometimes true  Transportation Needs: No Transportation Needs (07/07/2023)   PRAPARE - Administrator, Civil Service (Medical): No    Lack of Transportation (Non-Medical): No  Physical Activity: Insufficiently Active (07/07/2023)   Exercise Vital Sign    Days of Exercise per Week: 2 days    Minutes of Exercise per Session: 60 min  Stress: Stress Concern Present (07/07/2023)   [employer] of Occupational Health - Occupational Stress Questionnaire    Feeling of Stress : Very much  Social Connections: Moderately Integrated (07/07/2023)   Social Connection and Isolation Panel [NHANES]    Frequency of Communication with Friends and Family: More than three times a week    Frequency of Social Gatherings with Friends and Family: Twice a week    Attends Religious Services: More than 4 times per year    Active Member of [employer] or Organizations: Yes    Attends Engineer, structural: More than 4 times per year    Marital Status: Divorced  Intimate Partner Violence: Not At Risk (04/18/2023)   Humiliation, Afraid, Rape, and Kick questionnaire    Fear of Current or Ex-Partner: No    Emotionally Abused: No    Physically Abused: No    Sexually Abused: No    Family History  Problem Relation Age of Onset   Stroke Maternal Grandmother    Colon cancer Neg Hx    Esophageal cancer Neg Hx    Rectal cancer Neg Hx    Stomach cancer Neg Hx      Review of Systems  Constitutional: Negative.  Negative for chills and fever.   HENT: Negative.  Negative for congestion and sore throat.   Respiratory: Negative.  Negative for cough and shortness of breath.   Cardiovascular: Negative.  Negative for chest pain and palpitations.  Gastrointestinal:  Negative for  abdominal pain, diarrhea, nausea and vomiting.  Genitourinary: Negative.  Negative for dysuria and hematuria.  Skin: Negative.  Negative for rash.  Neurological: Negative.  Negative for dizziness and headaches.  All other systems reviewed and are negative.   Vitals:   07/11/23 1541  BP: 112/80  Pulse: (!) 116  Temp: 98.4 F (36.9 C)  SpO2: 97%    Physical Exam Constitutional:      Appearance: Normal appearance.  HENT:     Head: Normocephalic.     Mouth/Throat:     Mouth: Mucous membranes are moist.     Pharynx: Oropharynx is clear.  Eyes:     Extraocular Movements: Extraocular movements intact.     Pupils: Pupils are equal, round, and reactive to light.  Cardiovascular:     Rate and Rhythm: Normal rate and regular rhythm.     Pulses: Normal pulses.     Heart sounds: Normal heart sounds.  Pulmonary:     Effort: Pulmonary effort is normal.     Breath sounds: Normal breath sounds.  Musculoskeletal:     Cervical back: No tenderness.  Lymphadenopathy:     Cervical: No cervical adenopathy.  Skin:    General: Skin is warm and dry.     Capillary Refill: Capillary refill takes less than 2 seconds.     Comments: Left foot: Distal plantar surface shows shallow ulcer.  See picture below  Neurological:     General: No focal deficit present.     Mental Status: He is alert and oriented to person, place, and time.  Psychiatric:        Mood and Affect: Mood normal.        Behavior: Behavior normal.      ASSESSMENT & PLAN: A total of 46 minutes was spent with the patient and counseling/coordination of care regarding preparing for this visit, review of most recent office visit notes, review of multiple chronic medical conditions and their management,  cardiovascular risks associated with uncontrolled diabetes, review of all medications and changes made, review of most recent bloodwork results and interpretation of today's hemoglobin A1c, review of health maintenance items, education on nutrition, prognosis, documentation, and need for follow up.   Problem List Items Addressed This Visit       Cardiovascular and Mediastinum   Hypertension associated with diabetes (HCC) - Primary   BP Readings from Last 3 Encounters:  07/11/23 112/80  04/26/23 (!) 137/90  11/09/22 118/64   Lab Results  Component Value Date   HGBA1C 12.4 (A) 07/11/2023   Walk control hypertension Continue amlodipine 10 mg daily and Hyzaar 100-12.5 mg daily Uncontrolled diabetes with hemoglobin A1c at 12.4 Only taking glipizide and insulin Continue glipizide 10 mg twice a day and Lantus insulin 40 units daily Restart Trulicity at 1.5 mg weekly.  Has been off Trulicity for the past 4 months at least Restart Jardiance 10 mg daily Cardiovascular risks associated with uncontrolled diabetes discussed Diet and nutrition discussed Needs endocrinology evaluation.  Referral placed today We will follow-up after that      Relevant Medications   empagliflozin (JARDIANCE) 10 MG TABS tablet   Dulaglutide (TRULICITY) 1.5 MG/0.5ML SOAJ   Other Relevant Orders   Ambulatory referral to Endocrinology   CBC with Differential/Platelet   Comprehensive metabolic panel   Lipid panel   Microalbumin / creatinine urine ratio     Endocrine   Dyslipidemia associated with type 2 diabetes mellitus (HCC)   Uncontrolled diabetes as described above Continue rosuvastatin 20 mg  daily Lipid profile done today      Relevant Medications   empagliflozin (JARDIANCE) 10 MG TABS tablet   Dulaglutide (TRULICITY) 1.5 MG/0.5ML SOAJ   Other Relevant Orders   POCT HgB A1C (Completed)   Ambulatory referral to Endocrinology   CBC with Differential/Platelet   Comprehensive metabolic panel   Lipid  panel   Microalbumin / creatinine urine ratio   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)   Needs evaluation at wound care center Referral placed today Needs better diabetes control      Relevant Medications   empagliflozin (JARDIANCE) 10 MG TABS tablet   Dulaglutide (TRULICITY) 1.5 MG/0.5ML SOAJ   Other Relevant Orders   AMB referral to wound care center     Other   Morbid obesity, unspecified obesity type (HCC)   Diet and nutrition discussed Advised to decrease amount of daily carbohydrate intake and daily calories and increase amount of plant-based protein in his diet Benefits of exercise discussed      Relevant Medications   empagliflozin (JARDIANCE) 10 MG TABS tablet   Dulaglutide (TRULICITY) 1.5 MG/0.5ML SOAJ   Patient Instructions  Diabetes Mellitus and Nutrition, Adult When you have diabetes, or diabetes mellitus, it is very important to have healthy eating habits because your blood sugar (glucose) levels are greatly affected by what you eat and drink. Eating healthy foods in the right amounts, at about the same times every day, can help you: Manage your blood glucose. Lower your risk of heart disease. Improve your blood pressure. Reach or maintain a healthy weight. What can affect my meal plan? Every person with diabetes is different, and each person has different needs for a meal plan. Your health care provider may recommend that you work with a dietitian to make a meal plan that is best for you. Your meal plan may vary depending on factors such as: The calories you need. The medicines you take. Your weight. Your blood glucose, blood pressure, and cholesterol levels. Your activity level. Other health conditions you have, such as heart or kidney disease. How do carbohydrates affect me? Carbohydrates, also called carbs, affect your blood glucose level more than any other type of food. Eating carbs raises the amount of glucose in  your blood. It is important to know how many carbs you can safely have in each meal. This is different for every person. Your dietitian can help you calculate how many carbs you should have at each meal and for each snack. How does alcohol affect me? Alcohol can cause a decrease in blood glucose (hypoglycemia), especially if you use insulin or take certain diabetes medicines by mouth. Hypoglycemia can be a life-threatening condition. Symptoms of hypoglycemia, such as sleepiness, dizziness, and confusion, are similar to symptoms of having too much alcohol. Do not drink alcohol if: Your health care provider tells you not to drink. You are pregnant, may be pregnant, or are planning to become pregnant. If you drink alcohol: Limit how much you have to: 0-1 drink a day for women. 0-2 drinks a day for men. Know how much alcohol is in your drink. In the [place] one drink equals one 12 oz bottle of beer (355 mL), one 5 oz glass of wine (148 mL), or one 1 oz glass of hard liquor (44 mL). Keep yourself hydrated with water, diet soda, or unsweetened iced tea. Keep in mind that regular soda, juice, and other mixers may contain a lot of sugar and must be counted  as carbs. What are tips for following this plan?  Reading food labels Start by checking the serving size on the Nutrition Facts label of packaged foods and drinks. The number of calories and the amount of carbs, fats, and other nutrients listed on the label are based on one serving of the item. Many items contain more than one serving per package. Check the total grams (g) of carbs in one serving. Check the number of grams of saturated fats and trans fats in one serving. Choose foods that have a low amount or none of these fats. Check the number of milligrams (mg) of salt (sodium) in one serving. Most people should limit total sodium intake to less than 2,300 mg per day. Always check the nutrition information of foods labeled as "low-fat" or "nonfat."  These foods may be higher in added sugar or refined carbs and should be avoided. Talk to your dietitian to identify your daily goals for nutrients listed on the label. Shopping Avoid buying canned, pre-made, or processed foods. These foods tend to be high in fat, sodium, and added sugar. Shop around the outside edge of the grocery store. This is where you will most often find fresh fruits and vegetables, bulk grains, fresh meats, and fresh dairy products. Cooking Use low-heat cooking methods, such as baking, instead of high-heat cooking methods, such as deep frying. Cook using healthy oils, such as olive, canola, or sunflower oil. Avoid cooking with butter, cream, or high-fat meats. Meal planning Eat meals and snacks regularly, preferably at the same times every day. Avoid going long periods of time without eating. Eat foods that are high in fiber, such as fresh fruits, vegetables, beans, and whole grains. Eat 4-6 oz (112-168 g) of lean protein each day, such as lean meat, chicken, fish, eggs, or tofu. One ounce (oz) (28 g) of lean protein is equal to: 1 oz (28 g) of meat, chicken, or fish. 1 egg.  cup (62 g) of tofu. Eat some foods each day that contain healthy fats, such as avocado, nuts, seeds, and fish. What foods should I eat? Fruits Berries. Apples. Oranges. Peaches. Apricots. Plums. Grapes. Mangoes. Papayas. Pomegranates. Kiwi. Cherries. Vegetables Leafy greens, including lettuce, spinach, kale, chard, collard greens, mustard greens, and cabbage. Beets. Cauliflower. Broccoli. Carrots. Green beans. Tomatoes. Peppers. Onions. Cucumbers. Brussels sprouts. Grains Whole grains, such as whole-wheat or whole-grain bread, crackers, tortillas, cereal, and pasta. Unsweetened oatmeal. Quinoa. Brown or wild rice. Meats and other proteins Seafood. Poultry without skin. Lean cuts of poultry and beef. Tofu. Nuts. Seeds. Dairy Low-fat or fat-free dairy products such as milk, yogurt, and  cheese. The items listed above may not be a complete list of foods and beverages you can eat and drink. Contact a dietitian for more information. What foods should I avoid? Fruits Fruits canned with syrup. Vegetables Canned vegetables. Frozen vegetables with butter or cream sauce. Grains Refined white flour and flour products such as bread, pasta, snack foods, and cereals. Avoid all processed foods. Meats and other proteins Fatty cuts of meat. Poultry with skin. Breaded or fried meats. Processed meat. Avoid saturated fats. Dairy Full-fat yogurt, cheese, or milk. Beverages Sweetened drinks, such as soda or iced tea. The items listed above may not be a complete list of foods and beverages you should avoid. Contact a dietitian for more information. Questions to ask a health care provider Do I need to meet with a certified diabetes care and education specialist? Do I need to meet with a dietitian? What  number can I call if I have questions? When are the best times to check my blood glucose? Where to find more information: American Diabetes Association: diabetes.org Academy of Nutrition and Dietetics: eatright.[employer] of Diabetes and Digestive and Kidney Diseases: StageSync.si Association of Diabetes Care & Education Specialists: diabeteseducator.org Summary It is important to have healthy eating habits because your blood sugar (glucose) levels are greatly affected by what you eat and drink. It is important to use alcohol carefully. A healthy meal plan will help you manage your blood glucose and lower your risk of heart disease. Your health care provider may recommend that you work with a dietitian to make a meal plan that is best for you. This information is not intended to replace advice given to you by your health care provider. Make sure you discuss any questions you have with your health care provider. Document Revised: 11/27/2019 Document Reviewed: 11/28/2019 Elsevier  Patient Education  2024 Elsevier Inc.     [name], MD [facility]

## 2023-07-11 NOTE — Assessment & Plan Note (Signed)
Diet and nutrition discussed.  Advised to decrease amount of daily carbohydrate intake and daily calories and increase amount of plant-based protein in his diet. Benefits of exercise discussed. 

## 2023-07-11 NOTE — Assessment & Plan Note (Signed)
Needs evaluation at wound care center Referral placed today Needs better diabetes control

## 2023-07-11 NOTE — Assessment & Plan Note (Signed)
Uncontrolled diabetes as described above Continue rosuvastatin 20 mg daily Lipid profile done today

## 2023-07-12 ENCOUNTER — Other Ambulatory Visit (HOSPITAL_COMMUNITY): Payer: Self-pay

## 2023-07-12 LAB — COMPREHENSIVE METABOLIC PANEL
ALT: 22 U/L (ref 0–53)
AST: 19 U/L (ref 0–37)
Albumin: 4.2 g/dL (ref 3.5–5.2)
Alkaline Phosphatase: 64 U/L (ref 39–117)
BUN: 12 mg/dL (ref 6–23)
CO2: 29 meq/L (ref 19–32)
Calcium: 9.7 mg/dL (ref 8.4–10.5)
Chloride: 96 meq/L (ref 96–112)
Creatinine, Ser: 1.01 mg/dL (ref 0.40–1.50)
GFR: 92.84 mL/min (ref >60.00)
Glucose, Bld: 362 mg/dL — ABNORMAL HIGH (ref 70–99)
Potassium: 3.8 meq/L (ref 3.5–5.1)
Sodium: 135 meq/L (ref 135–145)
Total Bilirubin: 0.5 mg/dL (ref 0.2–1.2)
Total Protein: 7.8 g/dL (ref 6.0–8.3)

## 2023-07-12 LAB — CBC WITH DIFFERENTIAL/PLATELET
Basophils Absolute: 0.1 10*3/uL (ref 0.0–0.1)
Basophils Relative: 0.7 % (ref 0.0–3.0)
Eosinophils Absolute: 0.2 10*3/uL (ref 0.0–0.7)
Eosinophils Relative: 1.5 % (ref 0.0–5.0)
HCT: 40.7 % (ref 39.0–52.0)
Hemoglobin: 13.4 g/dL (ref 13.0–17.0)
Lymphocytes Relative: 21.1 % (ref 12.0–46.0)
Lymphs Abs: 2.7 10*3/uL (ref 0.7–4.0)
MCHC: 32.8 g/dL (ref 30.0–36.0)
MCV: 81.2 fl (ref 78.0–100.0)
Monocytes Absolute: 0.8 10*3/uL (ref 0.1–1.0)
Monocytes Relative: 6.7 % (ref 3.0–12.0)
Neutro Abs: 8.8 10*3/uL — ABNORMAL HIGH (ref 1.4–7.7)
Neutrophils Relative %: 70 % (ref 43.0–77.0)
Platelets: 391 10*3/uL (ref 150.0–400.0)
RBC: 5.02 Mil/uL (ref 4.22–5.81)
RDW: 14.6 % (ref 11.5–15.5)
WBC: 12.6 10*3/uL — ABNORMAL HIGH (ref 4.0–10.5)

## 2023-07-12 LAB — LIPID PANEL
Cholesterol: 145 mg/dL (ref 0–200)
HDL: 32.2 mg/dL — ABNORMAL LOW (ref >39.00)
LDL Cholesterol: 34 mg/dL (ref 0–99)
NonHDL: 113.26
Total CHOL/HDL Ratio: 5
Triglycerides: 396 mg/dL — ABNORMAL HIGH (ref 0.0–149.0)
VLDL: 79.2 mg/dL — ABNORMAL HIGH (ref 0.0–40.0)

## 2023-07-12 LAB — MICROALBUMIN / CREATININE URINE RATIO
Creatinine,U: 209.3 mg/dL
Microalb Creat Ratio: 16.9 mg/g (ref 0.0–30.0)
Microalb, Ur: 3.5 mg/dL — ABNORMAL HIGH (ref 0.0–1.9)

## 2023-07-12 NOTE — Telephone Encounter (Signed)
 Patient changed to 1.5mg 

## 2023-07-20 ENCOUNTER — Encounter: Payer: Self-pay | Admitting: Emergency Medicine

## 2023-07-20 DIAGNOSIS — E11621 Type 2 diabetes mellitus with foot ulcer: Secondary | ICD-10-CM

## 2023-07-20 DIAGNOSIS — M86172 Other acute osteomyelitis, left ankle and foot: Secondary | ICD-10-CM | POA: Diagnosis not present

## 2023-07-20 DIAGNOSIS — I739 Peripheral vascular disease, unspecified: Secondary | ICD-10-CM | POA: Diagnosis not present

## 2023-07-20 DIAGNOSIS — R262 Difficulty in walking, not elsewhere classified: Secondary | ICD-10-CM | POA: Diagnosis not present

## 2023-07-20 DIAGNOSIS — E1165 Type 2 diabetes mellitus with hyperglycemia: Secondary | ICD-10-CM

## 2023-07-20 DIAGNOSIS — L97522 Non-pressure chronic ulcer of other part of left foot with fat layer exposed: Secondary | ICD-10-CM | POA: Diagnosis not present

## 2023-07-20 DIAGNOSIS — L0889 Other specified local infections of the skin and subcutaneous tissue: Secondary | ICD-10-CM | POA: Diagnosis not present

## 2023-07-25 DIAGNOSIS — L97522 Non-pressure chronic ulcer of other part of left foot with fat layer exposed: Secondary | ICD-10-CM | POA: Diagnosis not present

## 2023-07-27 ENCOUNTER — Ambulatory Visit: Admitting: Podiatry

## 2023-07-28 ENCOUNTER — Ambulatory Visit (INDEPENDENT_AMBULATORY_CARE_PROVIDER_SITE_OTHER): Admitting: Podiatry

## 2023-07-28 DIAGNOSIS — L97512 Non-pressure chronic ulcer of other part of right foot with fat layer exposed: Secondary | ICD-10-CM

## 2023-07-28 DIAGNOSIS — L97522 Non-pressure chronic ulcer of other part of left foot with fat layer exposed: Secondary | ICD-10-CM

## 2023-07-28 MED ORDER — DOXYCYCLINE HYCLATE 100 MG PO TABS: 100.0000 mg | ORAL_TABLET | Freq: Two times a day (BID) | ORAL | 0 refills | Status: DC

## 2023-07-28 MED ORDER — SILVER SULFADIAZINE 1 % EX CREA
1.0000 | TOPICAL_CREAM | Freq: Every day | CUTANEOUS | 0 refills | Status: AC
Start: 2023-07-28 — End: ?

## 2023-07-28 NOTE — Progress Notes (Signed)
 Subjective:  Patient ID: ALBERT HERSCH, male    DOB: 11/20/1980,  MRN: [id number]  Chief Complaint  Patient presents with   Wound Check    RM#11 Patient states had an ulcer with infection on left foot had surgery reinfected and presents today with discomfort of left foot and ulcer minor foot odor minor drainage.    Discussed the use of AI scribe software for clinical note transcription with the patient, who gave verbal consent to proceed.  History of Present Illness [name] with a history of type 2 diabetes presents with concerns about a foot ulcer. The patient's last recorded A1c on July 11, 2023, was 12.4. The patient has a history of hospital admissions and surgeries related to foot wounds and abscesses. In December 2024, the patient was admitted to the hospital for a diabetic foot wound and abscess. The patient underwent surgery for abscess and ulcer excision, incision and drainage, and antibiotic bead placement.  The patient reports that the wound on the bottom of the foot at the big toe was initially small but got infected to the point of causing pain up to the groin and hip. The patient was diagnosed with septic shock and underwent surgery. The wound healed well after the surgery, and the patient followed all postoperative care instructions. However, the patient believes that a suture from the surgery might still be in the foot, causing reinfection.  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 The patient has been managing these wounds with Neosporin and changing the dressing daily. The patient also reports having neuropathy and not being able to feel the wounds.  Currently denies any fevers or chills.      Objective:    Physical Exam General: AAO x3, NAD  Dermatological: Wound on left foot ball 1.7x1x0.1 cm. Wound on left second toe 0.5x0.5 cm. Wound  on right second toe 0.6x0.5 cm. Dried blood under big toe on the right.  There is no probing, undermining or tunneling to any of the wounds.  There is mild edema present to the foot on the left but there is no erythema, warmth.  There is no areas of fluctuation or crepitation bilaterally.  There is no malodor.  There is no purulence.  All the area of concern on the left foot submetatarsal 1 we had surgery previously not able to visualize or palpate any suture.  Vascular: Dorsalis Pedis artery and Posterior Tibial artery pedal pulses are 2/4 bilateral with immedate capillary fill time.There is no pain with calf compression, swelling, warmth, erythema.   Neruologic: Sensation decreased with Semmes Weinstein monofilament.  Musculoskeletal: No pain on exam  Gait: Unassisted, Nonantalgic-presents wearing boots           Results   LABS A1c: 12.4 (07/11/2023)   Assessment:   1. Ulcer of left foot with fat layer exposed (HCC)   2. Ulcer of right foot with fat layer exposed (HCC)      Plan:  Patient was evaluated and treated and all questions answered.  Assessment and Plan Assessment & Plan Diabetic foot ulcer with reinfection Reinfection of diabetic foot ulcer on left big toe, likely due to uncontrolled diabetes and peripheral neuropathy. No retained suture found. New ulceration present. - Prescribed silvadene cream for topical application. 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  Advised to  wear this when he is not working.  Unfortunately he has to continue to work and wear steel toed shoes. - Instructed to monitor for signs of infection such as fever or chills and seek medical attention if these occur.  If symptoms worsen to contact us or go to the emergency department. - I did not order new ABI today as he has strong palpable pulses today.   Medically necessary wound debridement was performed of the left  foot ulceration.  Sharply debrided the wound utilizing the 312 with scalpel down to granular, healthy tissue.  Pre and post measurements of the same I debrided hyperkeratotic periwound.  There is no blood loss.  Tolerated well.  I cleaned the area with saline.  Silvadene applied followed by dressing.  Peripheral neuropathy Peripheral neuropathy secondary to poorly controlled type 2 diabetes, contributing to foot ulcers due to decreased sensation. - Advised on importance of glycemic control to manage neuropathy and prevent complications. - Encouraged regular foot inspections to identify new wounds or pressure points early.  Type 2 diabetes mellitus with poor glycemic control Poorly controlled type 2 diabetes with A1c of 12.4%, increasing risk for foot ulcers, infections, and neuropathy. - Emphasized importance of diabetes management, including medication adherence, diet, and lifestyle modifications to improve glycemic control.   Return in about 2 weeks (around 08/11/2023).   Vivi Barrack DPM

## 2023-07-28 NOTE — Progress Notes (Deleted)
 Subjective:  Patient ID: [name], male    DOB: 1980/07/12,  MRN: [id number]  Chief Complaint  Patient presents with   Wound Check    RM#11 Patient states had an ulcer with infection on left foot had surgery reinfected and presents today with discomfort of left foot and ulcer minor foot odor minor drainage.    Discussed the use of AI scribe software for clinical note transcription with the patient, who gave verbal consent to proceed.  History of Present Illness [name] with a history of type 2 diabetes presents with concerns about a foot ulcer. The patient's last recorded A1c on July 11, 2023, was 12.4. The patient has a history of hospital admissions and surgeries related to foot wounds and abscesses. In December 2024, the patient was admitted to the hospital for a diabetic foot wound and abscess. The patient underwent surgery for abscess and ulcer excision, incision and drainage, and antibiotic bead placement.  The patient reports that the wound on the bottom of the foot at the big toe was initially small but got infected to the point of causing pain up to the groin and hip. The patient was diagnosed with septic shock and underwent surgery. The wound healed well after the surgery, and the patient followed all postoperative care instructions. However, the patient believes that a suture from the surgery might still be in the foot, causing reinfection.  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 The patient has been managing these wounds with Neosporin and changing the dressing daily. The patient also reports having neuropathy and not being able to feel the wounds.      Objective:    Physical Exam CARDIOVASCULAR: Pulses palpable in feet. NEUROLOGICAL: Decreased sensation in feet. SKIN: Wound on left foot ball 1.7x1x0.1 cm. Wound on left second toe 0.5x0.5  cm. Wound on right second toe 0.6x0.5 cm. Dried blood under big toe.          Results Procedure: Wound debridement Description: Necrotic tissue and callous were debrided from the left foot wound. No retained suture was found. A small area of eschar was debrided.  LABS A1c: 12.4 (07/11/2023)   Assessment:   1. Ulcer of left foot with fat layer exposed (HCC)   2. Ulcer of right foot with fat layer exposed (HCC)      Plan:  Patient was evaluated and treated and all questions answered.  Assessment and Plan Assessment & Plan Diabetic foot ulcer with reinfection Reinfection of diabetic foot ulcer on left big toe, likely due to uncontrolled diabetes and peripheral neuropathy. No retained suture found. New ulceration present. - Prescribed silvadene cream for topical application. - Prescribed oral antibiotics for reinfection. - Advised wearing open-toed shoe or foot defender boot when not at work. - Instructed to monitor for signs of infection such as fever or chills and seek medical attention if these occur.  Peripheral neuropathy Peripheral neuropathy secondary to poorly controlled type 2 diabetes, contributing to foot ulcers due to decreased sensation. - Advised on importance of glycemic control to manage neuropathy and prevent complications. - Encouraged regular foot inspections to identify new wounds or pressure points early.  Type 2 diabetes mellitus with poor glycemic control Poorly controlled type 2 diabetes with A1c of 12.4%, increasing risk for foot ulcers, infections, and neuropathy. - Emphasized importance of diabetes management, including medication adherence, diet, and lifestyle modifications to improve glycemic  control.      No follow-ups on file.

## 2023-08-04 ENCOUNTER — Encounter (HOSPITAL_BASED_OUTPATIENT_CLINIC_OR_DEPARTMENT_OTHER): Admitting: Internal Medicine

## 2023-08-11 ENCOUNTER — Ambulatory Visit: Payer: BC Managed Care – PPO | Admitting: Neurology

## 2023-08-11 ENCOUNTER — Other Ambulatory Visit (HOSPITAL_COMMUNITY): Payer: Self-pay | Admitting: Psychiatry

## 2023-08-11 DIAGNOSIS — F901 Attention-deficit hyperactivity disorder, predominantly hyperactive type: Secondary | ICD-10-CM

## 2023-08-11 DIAGNOSIS — F321 Major depressive disorder, single episode, moderate: Secondary | ICD-10-CM

## 2023-08-12 ENCOUNTER — Ambulatory Visit (INDEPENDENT_AMBULATORY_CARE_PROVIDER_SITE_OTHER): Admitting: Podiatry

## 2023-08-12 ENCOUNTER — Encounter: Payer: Self-pay | Admitting: Podiatry

## 2023-08-12 DIAGNOSIS — L97522 Non-pressure chronic ulcer of other part of left foot with fat layer exposed: Secondary | ICD-10-CM

## 2023-08-12 DIAGNOSIS — L97521 Non-pressure chronic ulcer of other part of left foot limited to breakdown of skin: Secondary | ICD-10-CM | POA: Diagnosis not present

## 2023-08-12 NOTE — Progress Notes (Signed)
  Subjective:  Patient ID: [name], male    DOB: 1981-01-04,  MRN: [id number]  Chief Complaint  Patient presents with   Wound Check    Patient states everything has been fine since last visit.     Discussed the use of AI scribe software for clinical note transcription with the patient, who gave verbal consent to proceed.  History of Present Illness The patient, with a history of chronic foot wounds, presents with a new lesion on the fourth toe that started two days ago. He denies any changes in shoes, activity, or systemic symptoms such as fevers or chills. He also denies any drainage from the new lesion. However, he reports some drainage from the second toe and a lesion on the right side of the left foot. He has been applying silvadene cream to the wounds and has completed a course of antibiotics. He wears a foot Psychologist, forensic when going out but finds it a nuisance to wear around the house.      Objective:    Physical Exam General: AAO x3, NAD  Dermatological: On the left foot there is an ulceration present submetatarsal which measures 1.5 x 0.8 x 0.2 cm granular wound base.  Also wound present on the dorsal aspect of the second toe which measures 0.5 x 0.3 cm.  There is no probing to bone, entire time.  There is no fluctuation or crepitation there is no.  1 also present on the right second toe there are any probing or signs of infection.  There is new purulence on the lateral aspect of the fourth toe on the left foot which appears to be a dried hemorrhagic blister.  There is no drainage or positive purulence there is no areas of fluctuation or crepitation.  There is no malodor.  Vascular: Dorsalis Pedis artery and Posterior Tibial artery pedal pulses are 2/4 bilateral with immedate capillary fill time. There is no pain with calf compression, swelling, warmth, erythema.   Neruologic: Sensation decreased.  Musculoskeletal: No pain on exam.         Results    Assessment:    1. Ulcer of left foot with fat layer exposed (HCC)      Plan:  Patient was evaluated and treated and all questions answered.  Assessment and Plan Assessment & Plan Foot ulcer Chronic ulcers on left foot improving with treatment. New spot on fourth toe is a dry blood blister.  Left submetatarsal ulcer -On the left foot medically necessary wound debridement was performed today.  Utilizing the 312 scalpel sharply debrided the ulceration on the plantar aspect of foot with without any complications.  I debrided any nonviable, debrided tissue in order to promote wound healing.  There is minimal blood loss.  Hemostasis achieved with manual compression.  Silvadene applied followed by dressing.  Continue with daily dressing changes to all the wounds.  Lesser toe ulcerations/preulcerative lesion - Continue Silvadene cream on ulcers. - Monitor fourth toe spot for changes. - Wear foot defender boot when going out.  Offloading.  - Watch for infection signs: swelling, redness, flu-like symptoms. Contact if present. - Schedule follow-up in three weeks.  Vivi Barrack DPM     Return in about 3 weeks (around 09/02/2023) for left foot ulcer.

## 2023-08-13 ENCOUNTER — Other Ambulatory Visit: Payer: Self-pay | Admitting: Emergency Medicine

## 2023-08-26 DIAGNOSIS — L97522 Non-pressure chronic ulcer of other part of left foot with fat layer exposed: Secondary | ICD-10-CM | POA: Diagnosis not present

## 2023-09-01 ENCOUNTER — Other Ambulatory Visit: Payer: Self-pay | Admitting: Emergency Medicine

## 2023-09-01 ENCOUNTER — Encounter: Payer: Self-pay | Admitting: Emergency Medicine

## 2023-09-01 ENCOUNTER — Ambulatory Visit: Admitting: Emergency Medicine

## 2023-09-01 VITALS — BP 120/88 | HR 112 | Temp 98.5°F | Ht 75.0 in | Wt 305.0 lb

## 2023-09-01 DIAGNOSIS — E1159 Type 2 diabetes mellitus with other circulatory complications: Secondary | ICD-10-CM | POA: Diagnosis not present

## 2023-09-01 DIAGNOSIS — Z7985 Long-term (current) use of injectable non-insulin antidiabetic drugs: Secondary | ICD-10-CM

## 2023-09-01 DIAGNOSIS — Z794 Long term (current) use of insulin: Secondary | ICD-10-CM | POA: Diagnosis not present

## 2023-09-01 DIAGNOSIS — Z7984 Long term (current) use of oral hypoglycemic drugs: Secondary | ICD-10-CM

## 2023-09-01 DIAGNOSIS — E291 Testicular hypofunction: Secondary | ICD-10-CM | POA: Insufficient documentation

## 2023-09-01 DIAGNOSIS — F32A Depression, unspecified: Secondary | ICD-10-CM | POA: Diagnosis not present

## 2023-09-01 DIAGNOSIS — Z23 Encounter for immunization: Secondary | ICD-10-CM

## 2023-09-01 DIAGNOSIS — I152 Hypertension secondary to endocrine disorders: Secondary | ICD-10-CM

## 2023-09-01 LAB — TESTOSTERONE: Testosterone: 192.88 ng/dL — ABNORMAL LOW (ref 300.00–890.00)

## 2023-09-01 MED ORDER — XYOSTED 100 MG/0.5ML ~~LOC~~ SOAJ: 100.0000 mg | SUBCUTANEOUS | 5 refills | Status: DC

## 2023-09-01 NOTE — Assessment & Plan Note (Signed)
 Still active and affecting quality of life Not sure if he is still taking Effexor  Needs evaluation by psychiatrist Referral placed today.

## 2023-09-01 NOTE — Progress Notes (Signed)
 [name] 43 y.o.   Chief Complaint  Patient presents with   Anxiety    Patient states wanting his testosterone  levels checked. Also wants to discuss anxiety, depression, and weight loss.     HISTORY OF PRESENT ILLNESS: This is a 43 y.o. male complaining of chronic depression and anxiety Needs new referral to psychiatry Has history of hypogonadism.  Wants testosterone  levels checked Also wants to talk about weight loss No other complaints or medical concerns today.  Anxiety Patient reports no chest pain, dizziness, nausea, palpitations or shortness of breath.       Prior to Admission medications   Medication Sig Start Date End Date Taking? Authorizing Provider  acetaminophen  (TYLENOL ) 500 MG tablet Take 1,000 mg by mouth every 6 (six) hours as needed for mild pain.   Yes [provider]  amLODipine  (NORVASC ) 10 MG tablet TAKE 1 TABLET BY MOUTH EVERY DAY 09/13/22  Yes Bird Tailor, Isidro Margo, MD  atomoxetine  (STRATTERA ) 40 MG capsule Take 1 capsule (40 mg total) by mouth daily. 10/11/22 10/11/23 Yes Yves Herb, MD  blood glucose meter kit and supplies Dispense based on patient and insurance preference. Use up to four times daily as directed. (FOR ICD-10 E10.9, E11.9). 01/16/19  Yes Armenta Landau, MD  Continuous Blood Gluc Sensor (FREESTYLE LIBRE 3 SENSOR) MISC 1 application by Does not apply route 2 (two) times daily. Place 1 sensor on the skin every 14 days. Use to check glucose continuously 04/23/21  Yes Nakeem Murnane, Isidro Margo, MD  cyclobenzaprine  (FLEXERIL ) 10 MG tablet TAKE 1 TABLET BY MOUTH THREE TIMES A DAY AS NEEDED FOR MUSCLE SPASM 08/14/23  Yes Owens Hara, Isidro Margo, MD  doxycycline  (VIBRA -TABS) 100 MG tablet Take 1 tablet (100 mg total) by mouth 2 (two) times daily. 07/28/23  Yes Charity Conch, DPM  Dulaglutide  (TRULICITY ) 1.5 MG/0.5ML SOAJ Inject 1.5 mg into the skin once a week. 07/11/23  Yes Lakita Sahlin, Isidro Margo, MD  empagliflozin  (JARDIANCE ) 10 MG TABS  tablet Take 1 tablet (10 mg total) by mouth daily before breakfast. 07/11/23  Yes Sereen Schaff, Isidro Margo, MD  gabapentin  (NEURONTIN ) 300 MG capsule Take 1 capsule (300 mg total) by mouth 3 (three) times daily. 06/27/23 09/25/23 Yes Ammarie Matsuura, Isidro Margo, MD  glipiZIDE  (GLUCOTROL ) 10 MG tablet TAKE 1 TABLET (10 MG TOTAL) BY MOUTH TWICE A DAY BEFORE A MEAL 09/13/22  Yes Elvira Hammersmith, MD  hydrOXYzine  (ATARAX ) 10 MG tablet TAKE 1 TABLET (10 MG TOTAL) BY MOUTH AT BEDTIME AS NEEDED FOR ANXIETY 09/29/22  Yes Yves Herb, MD  insulin  glargine (LANTUS  SOLOSTAR) 100 UNIT/ML Solostar Pen Inject 15 Units into the skin daily. 11/09/22  Yes Jerene Yeager, Isidro Margo, MD  Insulin  Pen Needle (PEN NEEDLES) 32G X 4 MM MISC Use as directed with insulin  pen 01/27/23  Yes Justn Quale Jose, MD  loratadine (CLARITIN) 10 MG tablet Take 10 mg by mouth as needed for allergies.   Yes [provider]  losartan -hydrochlorothiazide (HYZAAR) 100-12.5 MG tablet TAKE 1 TABLET BY MOUTH EVERY DAY 09/13/22  Yes Diamond Martucci, Isidro Margo, MD  mirtazapine  (REMERON ) 15 MG tablet Take 1 tablet (15 mg total) by mouth at bedtime. 10/11/22  Yes Yves Herb, MD  omeprazole  (PRILOSEC) 40 MG capsule TAKE 1 CAPSULE (40 MG TOTAL) BY MOUTH 2 (TWO) TIMES DAILY BEFORE A MEAL. 11/10/22  Yes Kenney Peacemaker, MD  rosuvastatin  (CRESTOR ) 20 MG tablet TAKE 1 TABLET BY MOUTH EVERY DAY 09/13/22  Yes Korin Setzler Jose, MD  sildenafil  (VIAGRA ) 100 MG tablet TAKE 0.5-1 TABLETS BY MOUTH DAILY AS NEEDED FOR ERECTILE DYSFUNCTION. 05/15/22  Yes Keimya Briddell, Isidro Margo, MD  silver  sulfADIAZINE  (SILVADENE ) 1 % cream Apply 1 Application topically daily. 07/28/23  Yes Charity Conch, DPM  tadalafil  (CIALIS ) 20 MG tablet Take 0.5-1 tablets (10-20 mg total) by mouth every other day as needed for erectile dysfunction. 03/08/22  Yes Tajai Ihde, Isidro Margo, MD  Testosterone  Enanthate (XYOSTED ) 75 MG/0.5ML SOAJ Inject 75 mg into the skin once a week. 06/27/23  Yes  Allene Furuya Jose, MD  venlafaxine  XR (EFFEXOR -XR) 150 MG 24 hr capsule Take 1 capsule (150 mg total) by mouth daily. 09/06/22 09/06/23 Yes Yves Herb, MD  OXcarbazepine  (TRILEPTAL ) 150 MG tablet Take 1 tablet (150 mg total) by mouth 2 (two) times daily. 08/10/22 08/05/23  Camara, Amadou, MD    Allergies  Allergen Reactions   Amoxicillin Anaphylaxis and Other (See Comments)    Has patient had a PCN reaction causing immediate rash, facial/tongue/throat swelling, SOB or lightheadedness with hypotension: yes Has patient had a PCN reaction causing severe rash involving mucus membranes or skin necrosis: no Has patient had a PCN reaction that required hospitalization yes Has patient had a PCN reaction occurring within the last 10 years: yes If all of the above answers are "NO", then may proceed with Cephalosporin use.   Penicillins Anaphylaxis and Other (See Comments)    Patient was told to list this as an allergy because he is allergic to Amoxicillin Has patient had a PCN reaction causing immediate rash, facial/tongue/throat swelling, SOB or lightheadedness with hypotension: yes for Amoxicillin Has patient had a PCN reaction causing severe rash involving mucus membranes or skin necrosis: no Has patient had a PCN reaction that required hospitalization yes for Amoxicillin Has patient had a PCN reaction occurring within the last 10 years: yes for Amoxicillin If all o   Metformin  And Related Other (See Comments)    SEVERE GI UPSET   Other Other (See Comments)    Powder in some gloves - localized itching but not allergic to latex, benadryl  usually helps with this reaction   Latex Itching   Zolpidem Other (See Comments)    Passed out. 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) 02/07/2020   Morbid obesity, unspecified obesity type (HCC) 01/10/2020   Hypertension associated with diabetes (HCC) 04/17/2018   Dyslipidemia 04/17/2018   PTSD (post-traumatic stress disorder) 08/11/2016   Spinal stenosis, lumbar region with neurogenic claudication 05/05/2016   Dysthymia 09/02/2015   Chronic low back pain 09/02/2015   Dyslipidemia associated with type 2 diabetes mellitus (HCC) 03/11/2008   UNSPECIFIED ANEMIA 03/11/2008    Past Medical History:  Diagnosis Date   Allergies    Allergy    Anemia    Anxiety    Depression    Diabetes mellitus    GERD (gastroesophageal reflux disease)    Hyperlipidemia    Hypertension    Lumbar disc herniation    Neuromuscular disorder (HCC)    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    PTSD (post-traumatic stress disorder)    Scoliosis    Sleep  apnea     Past Surgical History:  Procedure Laterality Date   LUMBAR LAMINECTOMY/DECOMPRESSION MICRODISCECTOMY Left 05/05/2016   Procedure: CENTRAL DECOMPRESSION L4-L5 AND FORAMINOTOMY FOR L4 ROOT AND L5 ROOT ON THE LEFT;  Surgeon: Hazle Lites, MD;  Location: [facility];  Service: Orthopedics;  Laterality: Left;   NO PAST SURGERIES      Social History   Socioeconomic History   Marital status: Divorced    Spouse name: Not on file   Number of children: Not on file   Years of education: Not on file   Highest education level: Associate degree: occupational, Scientist, product/process development, or vocational program  Occupational History   Not on file  Tobacco Use   Smoking status: Former    Types: Cigars   Smokeless tobacco: Current    Types: Snuff  Vaping Use   Vaping status: Never Used  Substance and Sexual Activity   Alcohol use: Yes    Alcohol/week: 5.0 standard drinks of alcohol    Types: 5 Shots of liquor per week    Comment: few  times year   Drug use: No   Sexual activity: Not on file  Other Topics Concern   Not on file  Social History Narrative   Not on file   Social Drivers of Health   Financial Resource Strain: High Risk (07/07/2023)   Overall Financial Resource Strain (CARDIA)    Difficulty of Paying Living Expenses: Hard  Food Insecurity: Food Insecurity Present (07/07/2023)   Hunger Vital Sign    Worried About Running Out of Food in the Last Year: Sometimes true    Ran Out of Food in the Last Year: Sometimes true  Transportation Needs: No Transportation Needs (07/07/2023)   PRAPARE - Administrator, Civil Service (Medical): No    Lack of Transportation (Non-Medical): No  Physical Activity: Insufficiently Active (07/07/2023)   Exercise Vital Sign    Days of Exercise per Week: 2 days    Minutes of Exercise per Session: 60 min  Stress: Stress Concern Present (07/07/2023)   [employer] of Occupational Health - Occupational Stress Questionnaire    Feeling of Stress : Very much  Social Connections: Moderately Integrated (07/07/2023)   Social Connection and Isolation Panel [NHANES]    Frequency of Communication with Friends and Family: More than three times a week    Frequency of Social Gatherings with Friends and Family: Twice a week    Attends Religious Services: More than 4 times per year    Active Member of [employer] or Organizations: Yes    Attends Engineer, structural: More than 4 times per year    Marital Status: Divorced  Intimate Partner Violence: Not At Risk (04/18/2023)   Humiliation, Afraid, Rape, and Kick questionnaire    Fear of Current or Ex-Partner: No    Emotionally Abused: No    Physically Abused: No    Sexually Abused: No    Family History  Problem Relation Age of Onset   Stroke Maternal Grandmother    Colon cancer Neg Hx    Esophageal cancer Neg Hx    Rectal cancer Neg Hx    Stomach cancer Neg Hx      Review of Systems  Constitutional: Negative.   Negative for chills and fever.  HENT: Negative.  Negative for congestion and sore throat.   Respiratory: Negative.  Negative for cough and shortness of breath.   Cardiovascular: Negative.  Negative for chest pain and palpitations.  Gastrointestinal:  Negative for abdominal  pain, nausea and vomiting.  Genitourinary: Negative.  Negative for dysuria and urgency.  Skin: Negative.  Negative for rash.  Neurological: Negative.  Negative for dizziness and headaches.  All other systems reviewed and are negative.   Today's Vitals   09/01/23 1101  BP: 120/88  Pulse: (!) 112  Temp: 98.5 F (36.9 C)  TempSrc: Oral  SpO2: 96%  Weight: (!) 305 lb (138.3 kg)  Height: 6\' 3"  (1.905 m)   Body mass index is 38.12 kg/m.   Physical Exam Vitals reviewed.  Constitutional:      Appearance: Normal appearance.  HENT:     Head: Normocephalic.  Eyes:     Extraocular Movements: Extraocular movements intact.  Cardiovascular:     Rate and Rhythm: Normal rate.  Pulmonary:     Effort: Pulmonary effort is normal.  Skin:    General: Skin is warm and dry.  Neurological:     Mental Status: He is alert and oriented to person, place, and time.  Psychiatric:        Mood and Affect: Mood normal.        Behavior: Behavior normal.      ASSESSMENT & PLAN: A total of 33 minutes was spent with the patient and counseling/coordination of care regarding preparing for this visit, review of most recent office visit notes, review of multiple chronic medical conditions and their management, diagnosis of depression and need for psychiatric evaluation, review of all medications, review of most recent bloodwork results, review of health maintenance items, education on nutrition, prognosis, documentation, and need for follow up.   Problem List Items Addressed This Visit       Cardiovascular and Mediastinum   Hypertension associated with diabetes (HCC)   Well-controlled hypertension Continue amlodipine  10 mg daily  and Hyzaar 100-12.5 mg daily Uncontrolled diabetes with hemoglobin A1c at 12.4 Only taking glipizide  and insulin  Continue glipizide  10 mg twice a day and Lantus  insulin  40 units daily Restart Trulicity  at 1.5 mg weekly.  Has been off Trulicity  for the past 4 months at least Restart Jardiance  10 mg daily Cardiovascular risks associated with uncontrolled diabetes discussed Diet and nutrition discussed Needs endocrinology evaluation.  Referral placed today We will follow-up after that        Endocrine   Hypogonadism in male   Recommend testosterone  level today Has been on weekly testosterone  supplementation for 2 months No significant changes in his overall health      Relevant Orders   Testosterone      Other   Chronic depression - Primary   Still active and affecting quality of life Not sure if he is still taking Effexor  Needs evaluation by psychiatrist Referral placed today.      Relevant Orders   Ambulatory referral to Psychiatry   Other Visit Diagnoses       Need for vaccination       Relevant Orders   Tdap vaccine greater than or equal to 7yo IM (Completed)   Pneumococcal conjugate vaccine 20-valent (Prevnar 20) (Completed)      Patient Instructions  Major Depressive Disorder, Adult Major depressive disorder (MDD) is a mental health condition. People with this disorder feel very sad, hopeless, and lose interest in things. Symptoms last most of the day, almost every day, for 2 weeks. MDD can affect: Relationships. Work and school. Things you usually like to do. What are the causes? The cause of MDD is not known. What increases the risk? Having family members with depression. Being male. Family problems.  Alcohol or drug misuse. A lot of stress in your life, such as from: Living without basic needs such as food and housing. Being treated poorly because of race, sex, or religion (discrimination). Things that caused you pain as a child, especially if you lost  a parent or were abused. Health and mental problems that you have had for a long time. What are the signs or symptoms? The main symptoms of this condition are: Being sad all the time. Being grouchy (irritable) all the time. Not enjoying the things you usually like. Sleeping too much or too little. Eating too much or too little. Feeling tired. Other symptoms include: Gaining or losing weight, without knowing why. Being restless and weak. Feeling hopeless, worthless, or guilty. Trouble thinking or making decisions. Thoughts of hurting yourself or others, or thoughts of ending your life. Spending a lot of time alone. Being unable to do daily tasks. If you have very bad MDD, you may: Believe things that are not true. Hear, see, taste, or feel things that are not there. Have mild depression that lasts for at least 2 years. Feel very sad and hopeless. Have trouble speaking or moving. Feel very sad during some seasons. How is this treated? Talk therapy. This teaches you about thoughts, feelings, and actions and how to change them. This can also help you to talk with others. This can be done with members of your family. Medicines. Lifestyle changes. You may need to: Limit alcohol use. Stop using drugs, if you use them. Exercise. Get plenty of sleep. Eat healthy. Spend more time outdoors. Brain stimulation. This may be done when symptoms are very bad or have not gotten better. Follow these instructions at home: Alcohol use Do not drink alcohol if: Your health care provider tells you not to drink. You are pregnant, may be pregnant, or are planning to become pregnant. If you drink alcohol: Limit how much you use to: 0-1 drink a day for women. 0-2 drinks a day for men. Know how much alcohol is in your drink. In the [place] one drink equals one 12 oz bottle of beer (355 mL), one 5 oz glass of wine (148 mL), or one 1 oz glass of hard liquor (44 mL). Activity Exercise as told by your  doctor. Spend time outdoors. Make time to do the things you enjoy. Find ways to deal with stress. Try to: Meditate. Do deep breathing. Spend time in nature. Keep a journal. Return to your normal activities when your doctor says that it is safe. General instructions  Take over-the-counter and prescription medicines only as told by your doctor. Talk to your doctor about: Alcohol use. It can affect medicines. Any drug use. Eat healthy foods. Get a lot of sleep. Think about joining a support group. Ask your doctor about that. Keep all follow-up visits. Your doctor will need to check on your mood, behavior, and medicines, and change your treatment as needed. Where to find more information: [employer] on Mental Illness: nami.[employer] of Mental Health: BloggerCourse.com American Psychiatric Association: psychiatry.org Contact a doctor if: You feel worse. You get new symptoms. Get help right away if: You hurt yourself on purpose (self-harm). You have thoughts about hurting yourself or others. You see, hear, taste, smell, or feel things that are not there. Get help right away if you feel like you may hurt yourself or others, or have thoughts about taking your own life. Go to your nearest emergency room or: Call 911. Call the [facility] Suicide  Prevention Lifeline at 7863751494 or 988. This is open 24 hours a day. Text the Crisis Text Line at (805)461-4413. This information is not intended to replace advice given to you by your health care provider. Make sure you discuss any questions you have with your health care provider. Document Revised: 09/01/2021 Document Reviewed: 09/01/2021 Elsevier Patient Education  2024 Elsevier Inc.    Maryagnes Small, MD  Primary Care at [facility]

## 2023-09-01 NOTE — Assessment & Plan Note (Signed)
 Recommend testosterone  level today Has been on weekly testosterone  supplementation for 2 months No significant changes in his overall health

## 2023-09-01 NOTE — Patient Instructions (Signed)
 Major Depressive Disorder, Adult Major depressive disorder (MDD) is a mental health condition. People with this disorder feel very sad, hopeless, and lose interest in things. Symptoms last most of the day, almost every day, for 2 weeks. MDD can affect: Relationships. Work and school. Things you usually like to do. What are the causes? The cause of MDD is not known. What increases the risk? Having family members with depression. Being male. Family problems. Alcohol or drug misuse. A lot of stress in your life, such as from: Living without basic needs such as food and housing. Being treated poorly because of race, sex, or religion (discrimination). Things that caused you pain as a child, especially if you lost a parent or were abused. Health and mental problems that you have had for a long time. What are the signs or symptoms? The main symptoms of this condition are: Being sad all the time. Being grouchy (irritable) all the time. Not enjoying the things you usually like. Sleeping too much or too little. Eating too much or too little. Feeling tired. Other symptoms include: Gaining or losing weight, without knowing why. Being restless and weak. Feeling hopeless, worthless, or guilty. Trouble thinking or making decisions. Thoughts of hurting yourself or others, or thoughts of ending your life. Spending a lot of time alone. Being unable to do daily tasks. If you have very bad MDD, you may: Believe things that are not true. Hear, see, taste, or feel things that are not there. Have mild depression that lasts for at least 2 years. Feel very sad and hopeless. Have trouble speaking or moving. Feel very sad during some seasons. How is this treated? Talk therapy. This teaches you about thoughts, feelings, and actions and how to change them. This can also help you to talk with others. This can be done with members of your family. Medicines. Lifestyle changes. You may need to: Limit  alcohol use. Stop using drugs, if you use them. Exercise. Get plenty of sleep. Eat healthy. Spend more time outdoors. Brain stimulation. This may be done when symptoms are very bad or have not gotten better. Follow these instructions at home: Alcohol use Do not drink alcohol if: Your health care provider tells you not to drink. You are pregnant, may be pregnant, or are planning to become pregnant. If you drink alcohol: Limit how much you use to: 0-1 drink a day for women. 0-2 drinks a day for men. Know how much alcohol is in your drink. In the [place] one drink equals one 12 oz bottle of beer (355 mL), one 5 oz glass of wine (148 mL), or one 1 oz glass of hard liquor (44 mL). Activity Exercise as told by your doctor. Spend time outdoors. Make time to do the things you enjoy. Find ways to deal with stress. Try to: Meditate. Do deep breathing. Spend time in nature. Keep a journal. Return to your normal activities when your doctor says that it is safe. General instructions  Take over-the-counter and prescription medicines only as told by your doctor. Talk to your doctor about: Alcohol use. It can affect medicines. Any drug use. Eat healthy foods. Get a lot of sleep. Think about joining a support group. Ask your doctor about that. Keep all follow-up visits. Your doctor will need to check on your mood, behavior, and medicines, and change your treatment as needed. Where to find more information: The [employer] on Mental Illness: nami.[employer] of Mental Health: BloggerCourse.com American Psychiatric Association: psychiatry.org Contact a doctor  if: You feel worse. You get new symptoms. Get help right away if: You hurt yourself on purpose (self-harm). You have thoughts about hurting yourself or others. You see, hear, taste, smell, or feel things that are not there. Get help right away if you feel like you may hurt yourself or others, or have thoughts about taking  your own life. Go to your nearest emergency room or: Call 911. Call the National Suicide Prevention Lifeline at (513) 004-4926 or 988. This is open 24 hours a day. Text the Crisis Text Line at (314) 815-6587. This information is not intended to replace advice given to you by your health care provider. Make sure you discuss any questions you have with your health care provider. Document Revised: 09/01/2021 Document Reviewed: 09/01/2021 Elsevier Patient Education  2024 [employer].

## 2023-09-01 NOTE — Assessment & Plan Note (Signed)
 Well-controlled hypertension Continue amlodipine  10 mg daily and Hyzaar 100-12.5 mg daily Uncontrolled diabetes with hemoglobin A1c at 12.4 Only taking glipizide  and insulin  Continue glipizide  10 mg twice a day and Lantus  insulin  40 units daily Restart Trulicity  at 1.5 mg weekly.  Has been off Trulicity  for the past 4 months at least Restart Jardiance  10 mg daily Cardiovascular risks associated with uncontrolled diabetes discussed Diet and nutrition discussed Needs endocrinology evaluation.  Referral placed today We will follow-up after that

## 2023-09-04 ENCOUNTER — Other Ambulatory Visit: Payer: Self-pay | Admitting: Emergency Medicine

## 2023-09-04 DIAGNOSIS — M5090 Cervical disc disorder, unspecified, unspecified cervical region: Secondary | ICD-10-CM

## 2023-09-04 DIAGNOSIS — M5412 Radiculopathy, cervical region: Secondary | ICD-10-CM

## 2023-09-06 ENCOUNTER — Encounter: Payer: Self-pay | Admitting: Emergency Medicine

## 2023-09-06 ENCOUNTER — Other Ambulatory Visit: Payer: Self-pay | Admitting: Emergency Medicine

## 2023-09-06 MED ORDER — GABAPENTIN 300 MG PO CAPS
300.0000 mg | ORAL_CAPSULE | Freq: Three times a day (TID) | ORAL | 0 refills | Status: DC
Start: 2023-09-06 — End: 2023-12-23

## 2023-09-06 MED ORDER — TRAMADOL HCL 50 MG PO TABS: 50.0000 mg | ORAL_TABLET | Freq: Three times a day (TID) | ORAL | 1 refills | Status: AC | PRN

## 2023-09-06 NOTE — Telephone Encounter (Signed)
 Regarding diabetic medications refer to clinical pharmacist Christy referral [id number] for diabetic medication assistance.  I will send a prescription for tramadol to help with pain.

## 2023-09-07 ENCOUNTER — Other Ambulatory Visit: Payer: Self-pay | Admitting: Radiology

## 2023-09-08 ENCOUNTER — Other Ambulatory Visit: Payer: Self-pay | Admitting: Radiology

## 2023-09-08 DIAGNOSIS — E1165 Type 2 diabetes mellitus with hyperglycemia: Secondary | ICD-10-CM

## 2023-09-09 ENCOUNTER — Ambulatory Visit (INDEPENDENT_AMBULATORY_CARE_PROVIDER_SITE_OTHER)

## 2023-09-09 ENCOUNTER — Ambulatory Visit (INDEPENDENT_AMBULATORY_CARE_PROVIDER_SITE_OTHER): Admitting: Podiatry

## 2023-09-09 ENCOUNTER — Encounter: Payer: Self-pay | Admitting: Podiatry

## 2023-09-09 ENCOUNTER — Telehealth: Payer: Self-pay | Admitting: *Deleted

## 2023-09-09 DIAGNOSIS — L97522 Non-pressure chronic ulcer of other part of left foot with fat layer exposed: Secondary | ICD-10-CM | POA: Diagnosis not present

## 2023-09-09 DIAGNOSIS — M86172 Other acute osteomyelitis, left ankle and foot: Secondary | ICD-10-CM

## 2023-09-09 DIAGNOSIS — M84375A Stress fracture, left foot, initial encounter for fracture: Secondary | ICD-10-CM

## 2023-09-09 MED ORDER — DOXYCYCLINE HYCLATE 100 MG PO TABS
100.0000 mg | ORAL_TABLET | Freq: Two times a day (BID) | ORAL | 0 refills | Status: DC
Start: 2023-09-09 — End: 2023-12-14

## 2023-09-09 NOTE — Progress Notes (Unsigned)
 Care Guide Pharmacy Note  09/09/2023 Name: [name] MRN: [id number] DOB: [birth year]  Referred By: Elvira Hammersmith, MD Reason for referral: Complex Care Management and Call Attempt #1 (Outreach to schedule referral with pharmacist )   [name] is a 43 y.o. year old male who is a primary care patient of Sagardia, Miguel Jose, MD.  [name] was referred to the pharmacist for assistance related to: DMII  An unsuccessful telephone outreach was attempted today to contact the patient who was referred to the pharmacy team for assistance with medication assistance. Additional attempts will be made to contact the patient.  Kandis Ormond, CMA [employer]  [facility], [facility] Guide Direct Dial : [phone]  Fax: [phone] Website: [email or web address]

## 2023-09-10 LAB — BASIC METABOLIC PANEL WITH GFR
BUN: 13 mg/dL (ref 7–25)
CO2: 29 mmol/L (ref 20–32)
Calcium: 9.9 mg/dL (ref 8.6–10.3)
Chloride: 95 mmol/L — ABNORMAL LOW (ref 98–110)
Creat: 0.92 mg/dL (ref 0.60–1.29)
Glucose, Bld: 363 mg/dL — ABNORMAL HIGH (ref 65–99)
Potassium: 4.2 mmol/L (ref 3.5–5.3)
Sodium: 134 mmol/L — ABNORMAL LOW (ref 135–146)
eGFR: 108 mL/min/{1.73_m2} (ref >=60)

## 2023-09-10 LAB — CBC WITH DIFFERENTIAL/PLATELET
Absolute Lymphocytes: 2714 {cells}/uL (ref 850–3900)
Absolute Monocytes: 496 {cells}/uL (ref 200–950)
Basophils Absolute: 35 {cells}/uL (ref 0–200)
Basophils Relative: 0.3 %
Eosinophils Absolute: 142 {cells}/uL (ref 15–500)
Eosinophils Relative: 1.2 %
HCT: 44 % (ref 38.5–50.0)
Hemoglobin: 14 g/dL (ref 13.2–17.1)
MCH: 25.7 pg — ABNORMAL LOW (ref 27.0–33.0)
MCHC: 31.8 g/dL — ABNORMAL LOW (ref 32.0–36.0)
MCV: 80.9 fL (ref 80.0–100.0)
MPV: 9.6 fL (ref 7.5–12.5)
Monocytes Relative: 4.2 %
Neutro Abs: 8413 {cells}/uL — ABNORMAL HIGH (ref 1500–7800)
Neutrophils Relative %: 71.3 %
Platelets: 339 10*3/uL (ref 140–400)
RBC: 5.44 10*6/uL (ref 4.20–5.80)
RDW: 13.4 % (ref 11.0–15.0)
Total Lymphocyte: 23 %
WBC: 11.8 10*3/uL — ABNORMAL HIGH (ref 3.8–10.8)

## 2023-09-10 LAB — C-REACTIVE PROTEIN: CRP: 8.9 mg/L — ABNORMAL HIGH (ref <8.0)

## 2023-09-11 NOTE — Progress Notes (Signed)
  Subjective:  Patient ID: [name], male    DOB: May 11, 1980,  MRN: [id number]  Chief Complaint  Patient presents with   Diabetic Ulcer    RM#11 left foot ulcer patient has concerns of infection.Fourth and fifth toes have areas of concern.Drainage is present today and odor.     History of Present Illness The patient, with a history of chronic foot wounds.  States that last week he started noticing drainage coming from the wound.  He still working and wearing his work boots as he is not able to identify work but does not report any significant increase in activity compared to what he has.  Does not report any fevers or chills.     Objective:    Physical Exam General: AAO x3, NAD  Dermatological: On the left foot there is an ulceration present submetatarsal which measures 1.6 x 1 x 0.3 cm prior to debridement hyperkeratotic periwound.  After appointment the wound was larger measuring 1.8 x 1.2 x 0.5 cm.  The area does not probe to bone.  There is some malodor coming purulence identified.  There is no fluctuation or crepitation.  Ulceration also noted along the fourth interspace.       Vascular: Dorsalis Pedis artery and Posterior Tibial artery pedal pulses are 2/4 bilateral with immedate capillary fill time. There is no pain with calf compression, swelling, warmth, erythema.   Neruologic: Sensation decreased.  Musculoskeletal: No pain on exam.    Results X-rays were obtained reviewed.  There are likely healing stress fractures noted the 4th and 5th metatarsal which are new.  Also along the third metatarsal I am concerned about cortical changes suggest early osteomyelitis.  This is new compared to prior x-rays.    Assessment:   1. Ulcer of left foot with fat layer exposed (HCC)      Plan:  Patient was evaluated and treated and all questions answered.  Assessment and Plan Assessment & Plan Foot ulcer Chronic ulcers on left foot improving with treatment. New spot  on fourth toe is a dry blood blister.  Left submetatarsal ulcer -On the left foot medically necessary wound debridement was performed today.  Utilizing the 312 scalpel sharply debrided the ulceration on the plantar aspect of foot with without any complications.  I debrided any nonviable, debrided tissue in order to promote wound healing.  There is minimal blood loss.  Hemostasis achieved with manual compression.  Antibiotic ointment was applied followed by dressing.  Continue with daily dressing changes to all the wounds. - Ordered including CRP, sed rate, CBC - MRI ordered - Doxycyline  - Trying to stay out of work and stay off of it is much as possible to allow healing.  Discussed with him that he is at risk of amputation.  Lesser toe ulcerations/preulcerative lesion - Continue and changes daily.  Stress fractures - Likely subacute.  There is bone callus formation.  Discussed immobilization, stiffer soled shoes.  [name] DPM

## 2023-09-12 ENCOUNTER — Other Ambulatory Visit: Payer: Self-pay | Admitting: Podiatry

## 2023-09-12 ENCOUNTER — Encounter: Payer: Self-pay | Admitting: Podiatry

## 2023-09-13 ENCOUNTER — Other Ambulatory Visit: Payer: Self-pay | Admitting: Podiatry

## 2023-09-13 ENCOUNTER — Ambulatory Visit
Admission: RE | Admit: 2023-09-13 | Discharge: 2023-09-13 | Disposition: A | Discharge status: Home / Self Care | Source: Ambulatory Visit | Attending: Podiatry | Admitting: Podiatry

## 2023-09-13 ENCOUNTER — Other Ambulatory Visit: Payer: Self-pay | Admitting: Emergency Medicine

## 2023-09-13 ENCOUNTER — Ambulatory Visit (INDEPENDENT_AMBULATORY_CARE_PROVIDER_SITE_OTHER): Admitting: Podiatry

## 2023-09-13 DIAGNOSIS — N5201 Erectile dysfunction due to arterial insufficiency: Secondary | ICD-10-CM

## 2023-09-13 DIAGNOSIS — M86172 Other acute osteomyelitis, left ankle and foot: Secondary | ICD-10-CM | POA: Diagnosis not present

## 2023-09-13 DIAGNOSIS — L97522 Non-pressure chronic ulcer of other part of left foot with fat layer exposed: Secondary | ICD-10-CM

## 2023-09-13 DIAGNOSIS — M869 Osteomyelitis, unspecified: Secondary | ICD-10-CM | POA: Diagnosis not present

## 2023-09-13 DIAGNOSIS — R600 Localized edema: Secondary | ICD-10-CM | POA: Diagnosis not present

## 2023-09-13 DIAGNOSIS — M84375A Stress fracture, left foot, initial encounter for fracture: Secondary | ICD-10-CM

## 2023-09-13 DIAGNOSIS — S92352A Displaced fracture of fifth metatarsal bone, left foot, initial encounter for closed fracture: Secondary | ICD-10-CM | POA: Diagnosis not present

## 2023-09-13 DIAGNOSIS — S92342A Displaced fracture of fourth metatarsal bone, left foot, initial encounter for closed fracture: Secondary | ICD-10-CM | POA: Diagnosis not present

## 2023-09-13 NOTE — Progress Notes (Signed)
 Care Guide Pharmacy Note  09/13/2023 Name: JKAI ELFMAN MRN: [id number] DOB: 1980-05-12  Referred By: Elvira Hammersmith, MD Reason for referral: Complex Care Management and Call Attempt #1 (Outreach to schedule referral with pharmacist )   Rada Buerger is a 43 y.o. year old male who is a primary care patient of Sagardia, Miguel Jose, MD.  DAMIAN PROVANCE was referred to the pharmacist for assistance related to: DMII  Successful contact was made with the patient to discuss pharmacy services including being ready for the pharmacist to call at least 5 minutes before the scheduled appointment time and to have medication bottles and any blood pressure readings ready for review. The patient agreed to meet with the pharmacist via telephone visit on 09/27/2023  Kandis Ormond, CMA [place]  [facility], [facility] Guide Direct Dial : [phone]  Fax: [phone] Website: [email or web address]

## 2023-09-14 NOTE — Progress Notes (Signed)
 Subjective:  Patient ID: [name], male    DOB: [birth year],  MRN: [id number]  Chief Complaint  Patient presents with   Foot Ulcer    RM#13 Left foot foot ulcer follow up draining patient states has had puss pocket.    Discussed the use of AI scribe software for clinical note transcription with the patient, who gave verbal consent to proceed.  History of Present Illness [name] is a 43 year old male with diabetes who presents with concerns of a possible bone infection in his foot.  He noticed pus from his foot wound which is new since he was last seen. The dressing was last changed on Saturday afternoon, and he did not change it on Sunday. A blister developed next to the wound, which was not present during the last visit. He trimmed the skin around the blister, and currently, there is no pus drainage from the wound.  He has diabetes, which complicates his healing process. He experiences a loss of sensation from his calf down due to a previous back injury affecting his sciatic nerve, contributing to his current foot problems.  He is currently on doxycycline  for his foot condition. He has no fevers or chills.      Objective:    Physical Exam General: AAO x3, NAD-wearing regular boots  Dermatological: Left foot submetatarsalFull-thickness ulceration.  There is new area of blister just proximal to this area and upon debridement I am not able to appreciate any purulence.  Picture below is after I debrided the area of the blister.  There is no probing to bone, undermining or tunneling.  There is no fluctuation or crepitation.  There is no malodor.  Slight increased temperature to the foot.  No streaking noted.  Vascular: Dorsalis Pedis artery and Posterior Tibial artery pedal pulses are 2/4 bilateral with immedate capillary fill time.  There is no pain with calf compression, swelling, warmth, erythema.   Neruologic: Sensation decreased.  Musculoskeletal: No pain on exam  although he does have neuropathy.         Results   RADIOLOGY Foot MRI:-The reviewed the MRI.  Concern for osteomyelitis of the third metatarsal.  Discussed findings with the patient.   Assessment:   1. Foot ulcer with fat layer exposed, left (HCC)   2. Other acute osteomyelitis of left foot (HCC)      Plan:  Patient was evaluated and treated and all questions answered.  Assessment and Plan Assessment & Plan Bone infection (osteomyelitis) Suspected osteomyelitis beneath wound site. MRI suggests possible bone infection. Discussed long-term antibiotics or surgical intervention. Bone biopsy may be necessary for bacterial identification and antibiotic guidance. Explained risks of untreated infection, including rapid progression and potential amputation. Discussed surgery for biopsy or bone removal, and importance of stopping antibiotics before biopsy for accurate culture. - Continue doxycycline  until MRI results are available. - Await MRI report and call with results. - Instruct to seek immediate care if symptoms worsen (increased drainage, pus, fever, chills, swelling, flu-like symptoms).  Peripheral neuropathy Peripheral neuropathy likely secondary to diabetes, causing loss of sensation from calf down, contributing to foot injury.  Diabetes mellitus Diabetes mellitus with elevated blood glucose levels, impairing healing and increasing infection risk. High A1c has delayed treatment, complicating foot infection management.  Sepsis (historical) Sepsis previously diagnosed as SIRS, likely related to foot infection. No current signs of systemic infection.  Follow-up - Schedule follow-up appointment for next week with another provider as current provider will be unavailable. -  Maintain current Friday appointment with Dr. Edmundo Gourd. - Call later today with MRI results and further instructions.  *I contacted patient on 09/13/2023 to let him know with the MRI results.  After discussion  he is amendable to surgery for amputation.  Unfortunately I we will be out of the office.  I will discuss with another provider in the office to proceed with this.  He is aware of this.  If any symptoms worsen he is to report directly to emergency room.  [name]

## 2023-09-15 NOTE — Progress Notes (Signed)
 Dawn contacted the patient to let him know that she will be contacting him tomorrow to schedule. Dr. Rosemarie Conquest will take the patient to the OR next week.

## 2023-09-16 ENCOUNTER — Encounter: Payer: Self-pay | Admitting: Podiatry

## 2023-09-16 ENCOUNTER — Ambulatory Visit (INDEPENDENT_AMBULATORY_CARE_PROVIDER_SITE_OTHER): Admitting: Podiatry

## 2023-09-16 ENCOUNTER — Telehealth: Payer: Self-pay | Admitting: Podiatry

## 2023-09-16 VITALS — Ht 75.0 in | Wt 305.0 lb

## 2023-09-16 DIAGNOSIS — M86172 Other acute osteomyelitis, left ankle and foot: Secondary | ICD-10-CM | POA: Diagnosis not present

## 2023-09-16 DIAGNOSIS — B353 Tinea pedis: Secondary | ICD-10-CM

## 2023-09-16 DIAGNOSIS — L97522 Non-pressure chronic ulcer of other part of left foot with fat layer exposed: Secondary | ICD-10-CM

## 2023-09-16 DIAGNOSIS — L97523 Non-pressure chronic ulcer of other part of left foot with necrosis of muscle: Secondary | ICD-10-CM

## 2023-09-16 MED ORDER — KETOCONAZOLE 2 % EX CREA: 1.0000 | TOPICAL_CREAM | Freq: Every day | CUTANEOUS | 2 refills | Status: AC

## 2023-09-16 NOTE — Telephone Encounter (Signed)
 Thank you :)

## 2023-09-16 NOTE — Progress Notes (Unsigned)
 Chief Complaint  Patient presents with   Diabetic Ulcer    Pt is here to f/u on foot ulcer on left foot and to schedule upcoming surgery.    HPI: 43 y.o. male presenting today for left foot ulcer check and for preoperative evaluation.  Had MRI concerning for developing osteomyelitis left third metatarsal head.  Ulceration has remained stable since last seen Dr. Clydia Dart. He also has small ulceration to left dorsal 2nd toe and skin breakdown to the left 4th interspace. He has been on doxycycline   Past Medical History:  Diagnosis Date   Allergies    Allergy    Anemia    Anxiety    Depression    Diabetes mellitus    GERD (gastroesophageal reflux disease)    Hyperlipidemia    Hypertension    Lumbar disc herniation    Neuromuscular disorder (HCC)    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    PTSD (post-traumatic stress disorder)    Scoliosis    Sleep apnea    Past Surgical History:  Procedure Laterality Date   LUMBAR LAMINECTOMY/DECOMPRESSION MICRODISCECTOMY Left 05/05/2016   Procedure: CENTRAL DECOMPRESSION L4-L5 AND FORAMINOTOMY FOR L4 ROOT AND L5 ROOT ON THE LEFT;  Surgeon: Hazle Lites, MD;  Location: [facility];  Service: Orthopedics;  Laterality: Left;   NO PAST SURGERIES     Allergies  Allergen Reactions   Amoxicillin Anaphylaxis and Other (See Comments)    Has patient had a PCN reaction causing immediate rash, facial/tongue/throat swelling, SOB or lightheadedness with hypotension: yes Has patient had a PCN reaction causing severe rash involving mucus membranes or skin necrosis: no Has patient had a PCN reaction that required hospitalization yes Has patient had a PCN reaction occurring within the last 10 years: yes If all of the above answers are "NO", then may proceed with Cephalosporin use.   Penicillins Anaphylaxis and Other (See Comments)    Patient was told to list this as an allergy because he is allergic to Amoxicillin Has patient had a PCN reaction causing immediate rash,  facial/tongue/throat swelling, SOB or lightheadedness with hypotension: yes for Amoxicillin Has patient had a PCN reaction causing severe rash involving mucus membranes or skin necrosis: no Has patient had a PCN reaction that required hospitalization yes for Amoxicillin Has patient had a PCN reaction occurring within the last 10 years: yes for Amoxicillin If all o   Metformin  And Related Other (See Comments)    SEVERE GI UPSET   Other Other (See Comments)    Powder in some gloves - localized itching but not allergic to latex, benadryl  usually helps with this reaction   Latex Itching   Zolpidem Other (See Comments)    Passed out. And memory loss      PHYSICAL EXAM: There were no vitals filed for this visit.  General: The patient is alert and oriented x3 in no acute distress.  Dermatology: Skin is warm, dry and supple bilateral lower extremities. Interspaces are clear of maceration and debris.      Wound 1:  Location:  Left subthird metatarsal        Depth:  exposed subcutaneous tissue        Wound Border:  mild maceration        Wound Base:  granulation tissue        Drainage: scant serous       Odor?:  musky        Surrounding Tissue:  macerated, non erythematous  Infected?:  evidence of underlying osteomyelitis and possible abscess on MRI        Necrosis?:  wound bed viable        Pain?:  no        Tunneling:  no       Dimensions (cm):  3 x 1.2 cm in total    Wound 2:  Location:  Dorsal left 2nd DIPJ       Depth:  Subcutaneous issue       Wound Border:  hyperkeratotic       Wound Base:  Viable, subcutaneous tissue       Drainage: scant purulence       Odor?:  none       Surrounding Tissue:  erythematous       Infected?:  surrounding cellulitis       Necrosis?:  necrosis of skin       Pain?:  no       Tunneling:  no       Dimensions (cm):  0.4 cm post debridement, pre debridement loosely adhered callus  There is also interdigital callus to fourth toe without  underlying wound, there is some maceration to the webspace  Vascular: Pedal pulses are palpable 2/4 bilaterally for DP and PT pulse.  Capillary refill less than 3 seconds to the digits.  Neurological: Light touch sensation sensation decreased  Musculoskeletal Exam: No pain on exam, does have neuropathy     Latest Ref Rng & Units 07/11/2023    4:04 PM 11/09/2022    4:49 PM  Hemoglobin A1C  Hemoglobin-A1c 4.0 - 5.6 % 12.4  12.3      RADIOGRAPHIC EXAM:  RADIOLOGY Foot MRI:-The reviewed the MRI.  Concern for osteomyelitis of the third metatarsal.  Discussed findings with the patient. Osseous changes to the 2nd toe not appreciated that image quality somewhat limited  ASSESSMENT / PLAN OF CARE: 1. Foot ulcer with fat layer exposed, left (HCC)   2. Other acute osteomyelitis of left foot (HCC)   3. Tinea pedis of left foot   4. Toe ulcer, left, with fat layer exposed (HCC)      Meds ordered this encounter  Medications   ketoconazole (NIZORAL) 2 % cream    Sig: Apply 1 Application topically daily.    Dispense:  60 g    Refill:  2   None  # Left third metatarsal osteomyelitis - Patient regularly follows with Dr. Clydia Dart.  We discussed plan to proceed with surgical intervention describing partial versus complete third metatarsal resection, bone biopsies.  Biopsies may need to be obtained from the third toe as well.  Discussion of risks, benefits and alternative therapies were discussed at length.  Potential risks include pain, bleeding, wound healing complications, need for further procedures and amputation, deformity, limp, loss of function, loss of limb, death. Patient is concerned about losing 2nd toe but understands this may be necessary due to extent of infection. Also included consent for intrapoerative debridement of the second toe ulcer if necessary.  Patient is agreeable.  Written consent obtained.  Plan to proceed with surgery at [facility] with Dr. Rosemarie Conquest  on 5/14. - Wound did not need excisional debridement today.  Silvadene  bandage applied today. - Offloading applied - Patient states that he has home Psychologist, forensic.  Instructed him to bring this with him to surgery - Continue p.o. doxycycline  - Instructed patient to hold oral diabetes medications day before surgery.  He denies taking any insulin   right now.  # Left second toe ulceration with fat layer exposed - Medically necessary excisional debridement with 312 scalpel blade performed today of nonviable skin and subcutaneous tissue.  Bleeding wound bed noted.  Does not appear to involve the underlying bone at this point of there is minimal soft tissue coverage. - Erythema and edema present to the toe, continue antibiotics.  May need debridement intraoperatively as described above. - I personally reviewed the MRI today and did not appreciate signs of bone involvement of the second toe here, images of the toe were somewhat limited however and may benefit from bone biopsy  # Left fourth interspace interdigital maceration concerning for tinea pedis - Prescription for topical ketoconazole cream 2% sent to patient's pharmacy to be applied daily. - Monitor for signs of further ulceration or skin breakdown.  Return in about 2 weeks (around 09/30/2023) for Post op Check.   Reina Cara, DPM, AACFAS [facility]     [address] [place], [place] [address]                Office [phone]  Fax [phone]

## 2023-09-16 NOTE — Telephone Encounter (Signed)
 DOS:   09/21/23  OSTECT . COMP MET HEAD (LT) 3RD -28112      EFFECTIVE DATE:   05/11/23    PER THE [employer] AUTOMATED SYSTEM NO PRIOR AUTH IS REQ FOR CPT CODE 40981

## 2023-09-18 ENCOUNTER — Encounter: Payer: Self-pay | Admitting: Podiatry

## 2023-09-20 ENCOUNTER — Encounter
Admission: RE | Admit: 2023-09-20 | Discharge: 2023-09-20 | Disposition: A | Discharge status: Home / Self Care | Source: Ambulatory Visit | Attending: Podiatry | Admitting: Podiatry

## 2023-09-20 ENCOUNTER — Other Ambulatory Visit: Payer: Self-pay

## 2023-09-20 HISTORY — DX: Pneumonia, unspecified organism: J18.9

## 2023-09-20 HISTORY — DX: Unspecified osteoarthritis, unspecified site: M19.90

## 2023-09-20 HISTORY — DX: Other intervertebral disc degeneration, lumbar region without mention of lumbar back pain or lower extremity pain: M51.369

## 2023-09-20 HISTORY — DX: Epilepsy, unspecified, not intractable, without status epilepticus: G40.909

## 2023-09-20 NOTE — Patient Instructions (Addendum)
 Your procedure is scheduled on: 09/21/23 - Wednesday Report to the Registration Desk on the 1st floor of the [facility]. To find out your arrival time, please call [phone] between 1PM - 3PM on: 09/20/23 - Tuesday If your arrival time is 6:00 am, do not arrive before that time as the [facility] entrance doors do not open until 6:00 am.  REMEMBER: Instructions that are not followed completely may result in serious medical risk, up to and including death; or upon the discretion of your surgeon and anesthesiologist your surgery may need to be rescheduled.  Do not eat food or drink any liquids after midnight the night before surgery.  No gum chewing or hard candies.  One week prior to surgery: Stop Anti-inflammatories (NSAIDS) such as Advil , Aleve , Ibuprofen , Motrin , Naproxen , Naprosyn  and Aspirin  based products such as Excedrin, Goody's Powder, BC Powder. You may take Tylenol  if needed for pain up until the day of surgery.  Stop ANY OVER THE COUNTER supplements until after surgery.  sildenafil  (VIAGRA ) hold 2 days prior to your surgery  ON THE DAY OF SURGERY ONLY TAKE THESE MEDICATIONS WITH SIPS OF WATER:  amLODipine  (NORVASC )  doxycycline  (VIBRA -TABS)  omeprazole  (PRILOSEC)  OXcarbazepine  (TRILEPTAL )    No Alcohol for 24 hours before or after surgery.  No Smoking including e-cigarettes for 24 hours before surgery.  No chewable tobacco products for at least 6 hours before surgery.  No nicotine patches on the day of surgery.  Do not use any "recreational" drugs for at least a week (preferably 2 weeks) before your surgery.  Please be advised that the combination of cocaine and anesthesia may have negative outcomes, up to and including death. If you test positive for cocaine, your surgery will be cancelled.  On the morning of surgery brush your teeth with toothpaste and water, you may rinse your mouth with mouthwash if you wish. Do not swallow any toothpaste or  mouthwash.  Do not wear jewelry, make-up, hairpins, clips or nail polish.  For welded (permanent) jewelry: bracelets, anklets, waist bands, etc.  Please have this removed prior to surgery.  If it is not removed, there is a chance that hospital personnel will need to cut it off on the day of surgery.  Do not wear lotions, powders, or perfumes.   Do not shave body hair from the neck down 48 hours before surgery.  Contact lenses, hearing aids and dentures may not be worn into surgery.  Do not bring valuables to the hospital. [facility] is not responsible for any missing/lost belongings or valuables.   Notify your doctor if there is any change in your medical condition (cold, fever, infection).  Wear comfortable clothing (specific to your surgery type) to the hospital.  After surgery, you can help prevent lung complications by doing breathing exercises.  Take deep breaths and cough every 1-2 hours. Your doctor may order a device called an Incentive Spirometer to help you take deep breaths.  When coughing or sneezing, hold a pillow firmly against your incision with both hands. This is called "splinting." Doing this helps protect your incision. It also decreases belly discomfort.  If you are being admitted to the hospital overnight, leave your suitcase in the car. After surgery it may be brought to your room.  In case of increased patient census, it may be necessary for you, the patient, to continue your postoperative care in the Same Day Surgery department.  If you are being discharged the day of surgery, you will  not be allowed to drive home. You will need a responsible individual to drive you home and stay with you for 24 hours after surgery.   If you are taking public transportation, you will need to have a responsible individual with you.  Please call the Pre-admissions Testing Dept. at [phone] if you have any questions about these instructions.  Surgery Visitation  Policy:  Patients having surgery or a procedure may have two visitors.  Children under the age of 62 must have an adult with them who is not the patient.  Inpatient Visitation:    Visiting hours are 7 a.m. to 8 p.m. Up to four visitors are allowed at one time in a patient room. The visitors may rotate out with other people during the day.  One visitor age 28 or older may stay with the patient overnight and must be in the room by 8 p.m.

## 2023-09-21 ENCOUNTER — Ambulatory Visit

## 2023-09-21 ENCOUNTER — Ambulatory Visit
Admission: RE | Admit: 2023-09-21 | Discharge: 2023-09-21 | Disposition: A | Discharge status: Home / Self Care | Attending: Podiatry | Admitting: Podiatry

## 2023-09-21 ENCOUNTER — Ambulatory Visit: Admitting: Anesthesiology

## 2023-09-21 ENCOUNTER — Other Ambulatory Visit: Payer: Self-pay

## 2023-09-21 ENCOUNTER — Encounter: Payer: Self-pay | Admitting: Podiatry

## 2023-09-21 ENCOUNTER — Encounter
Admission: RE | Disposition: A | Discharge status: Home / Self Care | Payer: Self-pay | Source: Home / Self Care | Attending: Podiatry

## 2023-09-21 DIAGNOSIS — E119 Type 2 diabetes mellitus without complications: Secondary | ICD-10-CM | POA: Diagnosis not present

## 2023-09-21 DIAGNOSIS — Z87891 Personal history of nicotine dependence: Secondary | ICD-10-CM | POA: Diagnosis not present

## 2023-09-21 DIAGNOSIS — G4730 Sleep apnea, unspecified: Secondary | ICD-10-CM | POA: Diagnosis not present

## 2023-09-21 DIAGNOSIS — E11621 Type 2 diabetes mellitus with foot ulcer: Secondary | ICD-10-CM | POA: Insufficient documentation

## 2023-09-21 DIAGNOSIS — M1990 Unspecified osteoarthritis, unspecified site: Secondary | ICD-10-CM | POA: Insufficient documentation

## 2023-09-21 DIAGNOSIS — L97423 Non-pressure chronic ulcer of left heel and midfoot with necrosis of muscle: Secondary | ICD-10-CM | POA: Diagnosis not present

## 2023-09-21 DIAGNOSIS — L97522 Non-pressure chronic ulcer of other part of left foot with fat layer exposed: Secondary | ICD-10-CM | POA: Insufficient documentation

## 2023-09-21 DIAGNOSIS — S92352D Displaced fracture of fifth metatarsal bone, left foot, subsequent encounter for fracture with routine healing: Secondary | ICD-10-CM | POA: Diagnosis not present

## 2023-09-21 DIAGNOSIS — I10 Essential (primary) hypertension: Secondary | ICD-10-CM | POA: Insufficient documentation

## 2023-09-21 DIAGNOSIS — K219 Gastro-esophageal reflux disease without esophagitis: Secondary | ICD-10-CM | POA: Diagnosis not present

## 2023-09-21 DIAGNOSIS — S92342D Displaced fracture of fourth metatarsal bone, left foot, subsequent encounter for fracture with routine healing: Secondary | ICD-10-CM | POA: Diagnosis not present

## 2023-09-21 DIAGNOSIS — E1169 Type 2 diabetes mellitus with other specified complication: Secondary | ICD-10-CM | POA: Diagnosis not present

## 2023-09-21 DIAGNOSIS — M86172 Other acute osteomyelitis, left ankle and foot: Secondary | ICD-10-CM | POA: Diagnosis not present

## 2023-09-21 DIAGNOSIS — M86672 Other chronic osteomyelitis, left ankle and foot: Secondary | ICD-10-CM | POA: Diagnosis not present

## 2023-09-21 HISTORY — PX: METATARSAL HEAD EXCISION: SHX5027

## 2023-09-21 LAB — GLUCOSE, CAPILLARY
Glucose-Capillary: 335 mg/dL — ABNORMAL HIGH (ref 70–99)
Glucose-Capillary: 340 mg/dL — ABNORMAL HIGH (ref 70–99)

## 2023-09-21 SURGERY — EXCISION, METATARSAL BONE, HEAD
Anesthesia: General | Site: Foot | Laterality: Left

## 2023-09-21 MED ORDER — CHLORHEXIDINE GLUCONATE 0.12 % MT SOLN
OROMUCOSAL | Status: AC
  Filled 2023-09-21: qty 15

## 2023-09-21 MED ORDER — CHLORHEXIDINE GLUCONATE 0.12 % MT SOLN
15.0000 mL | Freq: Once | OROMUCOSAL | Status: AC
  Administered 2023-09-21: 15 mL via OROMUCOSAL

## 2023-09-21 MED ORDER — BUPIVACAINE HCL (PF) 0.5 % IJ SOLN
INTRAMUSCULAR | Status: AC
  Filled 2023-09-21: qty 30

## 2023-09-21 MED ORDER — OXYCODONE HCL 5 MG PO TABS: 5.0000 mg | ORAL_TABLET | Freq: Once | ORAL | Status: DC | PRN

## 2023-09-21 MED ORDER — FENTANYL CITRATE (PF) 100 MCG/2ML IJ SOLN: 25.0000 ug | INTRAMUSCULAR | Status: DC | PRN

## 2023-09-21 MED ORDER — ORAL CARE MOUTH RINSE: 15.0000 mL | Freq: Once | OROMUCOSAL | Status: AC

## 2023-09-21 MED ORDER — MIDAZOLAM HCL 2 MG/2ML IJ SOLN
INTRAMUSCULAR | Status: AC
  Filled 2023-09-21: qty 2

## 2023-09-21 MED ORDER — VANCOMYCIN HCL IN DEXTROSE 1-5 GM/200ML-% IV SOLN
1000.0000 mg | Freq: Once | INTRAVENOUS | Status: AC
  Administered 2023-09-21: 1000 mg via INTRAVENOUS

## 2023-09-21 MED ORDER — INSULIN ASPART 100 UNIT/ML IJ SOLN
11.0000 [IU] | Freq: Once | INTRAMUSCULAR | Status: AC
  Administered 2023-09-21: 11 [IU] via SUBCUTANEOUS

## 2023-09-21 MED ORDER — OXYCODONE-ACETAMINOPHEN 5-325 MG PO TABS: 1.0000 | ORAL_TABLET | ORAL | 0 refills | Status: DC | PRN

## 2023-09-21 MED ORDER — 0.9 % SODIUM CHLORIDE (POUR BTL) OPTIME
TOPICAL | Status: DC | PRN
Start: 2023-09-21 — End: 2023-09-21
  Administered 2023-09-21: 3000 mL

## 2023-09-21 MED ORDER — SODIUM CHLORIDE 0.9 % IV SOLN: INTRAVENOUS | Status: DC | PRN

## 2023-09-21 MED ORDER — BUPIVACAINE HCL 0.5 % IJ SOLN
INTRAMUSCULAR | Status: DC | PRN
  Administered 2023-09-21: 10 mL

## 2023-09-21 MED ORDER — AMOXICILLIN-POT CLAVULANATE 875-125 MG PO TABS: 1.0000 | ORAL_TABLET | Freq: Two times a day (BID) | ORAL | 0 refills | Status: AC

## 2023-09-21 MED ORDER — PHENYLEPHRINE 80 MCG/ML (10ML) SYRINGE FOR IV PUSH (FOR BLOOD PRESSURE SUPPORT)
PREFILLED_SYRINGE | INTRAVENOUS | Status: AC
  Filled 2023-09-21: qty 10

## 2023-09-21 MED ORDER — VANCOMYCIN HCL IN DEXTROSE 1-5 GM/200ML-% IV SOLN
INTRAVENOUS | Status: AC
  Filled 2023-09-21: qty 200

## 2023-09-21 MED ORDER — SODIUM CHLORIDE 0.9% FLUSH
3.0000 mL | Freq: Two times a day (BID) | INTRAVENOUS | Status: DC
Start: 2023-09-21 — End: 2023-09-21

## 2023-09-21 MED ORDER — MIDAZOLAM HCL 2 MG/2ML IJ SOLN
INTRAMUSCULAR | Status: DC | PRN
  Administered 2023-09-21: 2 mg via INTRAVENOUS

## 2023-09-21 MED ORDER — INSULIN ASPART 100 UNIT/ML IJ SOLN
10.0000 [IU] | Freq: Once | INTRAMUSCULAR | Status: AC
  Administered 2023-09-21: 10 [IU] via SUBCUTANEOUS

## 2023-09-21 MED ORDER — PHENYLEPHRINE 80 MCG/ML (10ML) SYRINGE FOR IV PUSH (FOR BLOOD PRESSURE SUPPORT)
PREFILLED_SYRINGE | INTRAVENOUS | Status: DC | PRN
  Administered 2023-09-21 (×4): 80 ug via INTRAVENOUS

## 2023-09-21 MED ORDER — PROPOFOL 10 MG/ML IV BOLUS
INTRAVENOUS | Status: AC
  Filled 2023-09-21: qty 20

## 2023-09-21 MED ORDER — FENTANYL CITRATE (PF) 100 MCG/2ML IJ SOLN
INTRAMUSCULAR | Status: AC
  Filled 2023-09-21: qty 2

## 2023-09-21 MED ORDER — FENTANYL CITRATE (PF) 100 MCG/2ML IJ SOLN
INTRAMUSCULAR | Status: DC | PRN
  Administered 2023-09-21 (×2): 50 ug via INTRAVENOUS

## 2023-09-21 MED ORDER — OXYCODONE HCL 5 MG/5ML PO SOLN: 5.0000 mg | Freq: Once | ORAL | Status: DC | PRN

## 2023-09-21 MED ORDER — INSULIN ASPART 100 UNIT/ML IJ SOLN
INTRAMUSCULAR | Status: AC
  Filled 2023-09-21: qty 1

## 2023-09-21 MED ORDER — SODIUM CHLORIDE 0.9% FLUSH: 3.0000 mL | INTRAVENOUS | Status: DC | PRN

## 2023-09-21 MED ORDER — PROPOFOL 500 MG/50ML IV EMUL
INTRAVENOUS | Status: DC | PRN
  Administered 2023-09-21: 50 mg via INTRAVENOUS
  Administered 2023-09-21: 150 ug/kg/min via INTRAVENOUS

## 2023-09-21 SURGICAL SUPPLY — 50 items
BLADE MED AGGRESSIVE (BLADE) ×2 IMPLANT
BLADE OSC/SAGITTAL MD 5.5X18 (BLADE) IMPLANT
BLADE SURG 15 STRL LF DISP TIS (BLADE) ×4 IMPLANT
BLADE SURG MINI STRL (BLADE) ×2 IMPLANT
BNDG ELASTIC 4INX 5YD STR LF (GAUZE/BANDAGES/DRESSINGS) ×2 IMPLANT
BNDG ESMARCH 4X12 STRL LF (GAUZE/BANDAGES/DRESSINGS) ×2 IMPLANT
BNDG GAUZE DERMACEA FLUFF 4 (GAUZE/BANDAGES/DRESSINGS) ×2 IMPLANT
CNTNR URN SCR LID CUP LEK RST (MISCELLANEOUS) IMPLANT
CUFF TOURN SGL QUICK 12 (TOURNIQUET CUFF) IMPLANT
CUFF TOURN SGL QUICK 18X4 (TOURNIQUET CUFF) IMPLANT
DRAPE FLUOR MINI C-ARM 54X84 (DRAPES) IMPLANT
DRSG EMULSION OIL 3X3 NADH (GAUZE/BANDAGES/DRESSINGS) IMPLANT
DRSG EMULSION OIL 3X8 NADH (GAUZE/BANDAGES/DRESSINGS) IMPLANT
DRSG TELFA 3X8 NADH STRL (GAUZE/BANDAGES/DRESSINGS) ×2 IMPLANT
DURAPREP 26ML APPLICATOR (WOUND CARE) ×2 IMPLANT
ELECTRODE REM PT RTRN 9FT ADLT (ELECTROSURGICAL) ×2 IMPLANT
GAUZE PAD ABD 8X10 STRL (GAUZE/BANDAGES/DRESSINGS) ×2 IMPLANT
GAUZE SPONGE 4X4 12PLY STRL (GAUZE/BANDAGES/DRESSINGS) ×4 IMPLANT
GAUZE STRETCH 2X75IN STRL (MISCELLANEOUS) IMPLANT
GAUZE XEROFORM 1X8 LF (GAUZE/BANDAGES/DRESSINGS) ×2 IMPLANT
GLOVE BIOGEL PI IND STRL 7.5 (GLOVE) ×2 IMPLANT
GLOVE SURG SYN 7.5 E (GLOVE) ×1 IMPLANT
GLOVE SURG SYN 7.5 PF PI (GLOVE) ×2 IMPLANT
GOWN STRL REUS W/ TWL LRG LVL3 (GOWN DISPOSABLE) IMPLANT
GOWN STRL REUS W/ TWL XL LVL3 (GOWN DISPOSABLE) ×2 IMPLANT
GRAFT SKIN WND MICRO 38 (Tissue) IMPLANT
HANDPIECE VERSAJET DEBRIDEMENT (MISCELLANEOUS) IMPLANT
KIT TURNOVER KIT A (KITS) ×2 IMPLANT
LABEL OR SOLS (LABEL) ×2 IMPLANT
MANIFOLD NEPTUNE II (INSTRUMENTS) ×2 IMPLANT
NDL FILTER BLUNT 18X1 1/2 (NEEDLE) ×2 IMPLANT
NDL HYPO 25X1 1.5 SAFETY (NEEDLE) ×4 IMPLANT
NEEDLE FILTER BLUNT 18X1 1/2 (NEEDLE) ×1 IMPLANT
NEEDLE HYPO 25X1 1.5 SAFETY (NEEDLE) ×2 IMPLANT
NS IRRIG 500ML POUR BTL (IV SOLUTION) ×2 IMPLANT
PACK EXTREMITY ARMC (MISCELLANEOUS) ×2 IMPLANT
PAD ABD DERMACEA PRESS 5X9 (GAUZE/BANDAGES/DRESSINGS) ×2 IMPLANT
PAD PREP OB/GYN DISP 24X41 (PERSONAL CARE ITEMS) ×2 IMPLANT
PENCIL SMOKE EVACUATOR (MISCELLANEOUS) ×2 IMPLANT
SOL .9 NS 3000ML IRR UROMATIC (IV SOLUTION) IMPLANT
SOLUTION PREP PVP 2OZ (MISCELLANEOUS) ×2 IMPLANT
STAPLER SKIN PROX 35W (STAPLE) ×2 IMPLANT
SUT MNCRL AB 3-0 PS2 27 (SUTURE) ×2 IMPLANT
SUT PROLENE 3 0 PS 2 (SUTURE) ×2 IMPLANT
SWAB CULTURE AMIES ANAERIB BLU (MISCELLANEOUS) IMPLANT
SYR 10ML LL (SYRINGE) ×2 IMPLANT
TIP BRUSH PULSAVAC PLUS 24.33 (MISCELLANEOUS) IMPLANT
TIP FAN IRRIG PULSAVAC PLUS (DISPOSABLE) IMPLANT
TRAP FLUID SMOKE EVACUATOR (MISCELLANEOUS) ×2 IMPLANT
WATER STERILE IRR 500ML POUR (IV SOLUTION) ×2 IMPLANT

## 2023-09-21 NOTE — H&P (Signed)
 SURGICAL HISTORY and PHYSICAL FOOT & ANKLE SURGERY    Date Time: 09/21/23 10:10 AM Physician: Rosemarie Conquest    History of Presenting Illness: [name] is a 43 y.o. year old male presenting to [facility] for surgery on Left foot today. 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 Has been undergoing local wound care without improvement. Here for planned Left 3rd metatarsal head resection, wound debridement, possible graft application. Was also concern for possible 2nd toe ulceration though patient reports this is continuing to improve with wound care.   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    Pneumonia    PTSD (post-traumatic stress disorder)    Scoliosis    Sleep apnea     Past Surgical History: Past Surgical History:  Procedure Laterality Date   LUMBAR LAMINECTOMY/DECOMPRESSION MICRODISCECTOMY Left 05/05/2016   Procedure: CENTRAL DECOMPRESSION L4-L5 AND FORAMINOTOMY FOR L4 ROOT AND L5 ROOT ON THE LEFT;  Surgeon: Hazle Lites, MD;  Location: [facility];  Service: Orthopedics;  Laterality: Left;   NO PAST SURGERIES     ulcer excision and I&D of abscess by Podiatry   04/2023   [facility]    Allergies: Allergies  Allergen Reactions   Amoxicillin Anaphylaxis and Other (See Comments)    Has patient had a PCN reaction causing immediate rash, facial/tongue/throat swelling, SOB or lightheadedness with hypotension: yes Has patient had a PCN reaction causing severe rash involving mucus membranes or skin necrosis: no Has patient had a PCN reaction that required  hospitalization yes Has patient had a PCN reaction occurring within the last 10 years: yes If all of the above answers are "NO", then may proceed with Cephalosporin use.   Penicillins Anaphylaxis and Other (See Comments)    Patient was told to list this as an allergy because he is allergic to Amoxicillin Has patient had a PCN reaction causing immediate rash, facial/tongue/throat swelling, SOB or lightheadedness with hypotension: yes for Amoxicillin Has patient had a PCN reaction causing severe rash involving mucus membranes or skin necrosis: no Has patient had a PCN reaction that required hospitalization yes for Amoxicillin Has patient had a PCN reaction occurring within the last 10 years: yes for Amoxicillin If all o   Metformin  And Related Other (See Comments)    SEVERE GI UPSET   Other Other (See Comments)    Powder in some gloves - localized itching but not allergic to latex, benadryl  usually helps with this reaction   Latex Itching   Zolpidem Other (See Comments)    Passed out. And memory loss    Home Medications: Current Facility-Administered Medications  Medication Dose Route Frequency Provider Last Rate Last Admin   sodium chloride  flush (NS) 0.9 % injection 3-10 mL  3-10 mL Intravenous Q12H Lattie Poli, MD       sodium chloride  flush (NS) 0.9 % injection 3-10 mL  3-10 mL Intravenous PRN Lattie Poli, MD       vancomycin  (VANCOCIN ) IVPB 1000 mg/200 mL  premix  1,000 mg Intravenous Once Lexany Belknap F, DPM 200 mL/hr at 09/21/23 0956 1,000 mg at 09/21/23 7829   Medications Prior to Admission  Medication Sig Dispense Refill Last Dose/Taking   acetaminophen  (TYLENOL ) 500 MG tablet Take 1,000 mg by mouth every 6 (six) hours as needed for mild pain.   Past Week   amLODipine  (NORVASC ) 10 MG tablet TAKE 1 TABLET BY MOUTH EVERY DAY 90 tablet 3 09/21/2023 Morning   cyclobenzaprine  (FLEXERIL ) 10 MG tablet TAKE 1 TABLET BY MOUTH THREE TIMES A DAY AS NEEDED FOR MUSCLE SPASM 90 tablet 1  09/20/2023 Bedtime   doxycycline  (VIBRA -TABS) 100 MG tablet Take 1 tablet (100 mg total) by mouth 2 (two) times daily. 20 tablet 0 09/21/2023 Morning   glipiZIDE  (GLUCOTROL ) 10 MG tablet TAKE 1 TABLET (10 MG TOTAL) BY MOUTH TWICE A DAY BEFORE A MEAL 180 tablet 3 09/20/2023 Evening   hydrOXYzine  (ATARAX ) 10 MG tablet TAKE 1 TABLET (10 MG TOTAL) BY MOUTH AT BEDTIME AS NEEDED FOR ANXIETY 90 tablet 1 Past Month   loratadine (CLARITIN) 10 MG tablet Take 10 mg by mouth as needed for allergies.   09/20/2023 Morning   losartan -hydrochlorothiazide (HYZAAR) 100-12.5 MG tablet TAKE 1 TABLET BY MOUTH EVERY DAY 90 tablet 3 09/20/2023 Morning   mirtazapine  (REMERON ) 15 MG tablet Take 1 tablet (15 mg total) by mouth at bedtime. (Patient taking differently: Take 15 mg by mouth as needed.) 30 tablet 2 Past Month   omeprazole  (PRILOSEC) 40 MG capsule TAKE 1 CAPSULE (40 MG TOTAL) BY MOUTH 2 (TWO) TIMES DAILY BEFORE A MEAL. 180 capsule 0 09/21/2023 Morning   OXcarbazepine  (TRILEPTAL ) 150 MG tablet Take 1 tablet (150 mg total) by mouth 2 (two) times daily. 180 tablet 3 09/21/2023   rosuvastatin  (CRESTOR ) 20 MG tablet TAKE 1 TABLET BY MOUTH EVERY DAY 90 tablet 3 09/20/2023 Morning   sildenafil  (VIAGRA ) 100 MG tablet TAKE 1/2 - 1 TABLET BY MOUTH DAILY AS NEEDED FOR ERECTILE DYSFUNCTION 5 tablet 11 Past Week   tadalafil  (CIALIS ) 20 MG tablet Take 0.5-1 tablets (10-20 mg total) by mouth every other day as needed for erectile dysfunction. 10 tablet 11 Past Month   atomoxetine  (STRATTERA ) 40 MG capsule Take 1 capsule (40 mg total) by mouth daily. (Patient not taking: Reported on 09/20/2023) 30 capsule 2    blood glucose meter kit and supplies Dispense based on patient and insurance preference. Use up to four times daily as directed. (FOR ICD-10 E10.9, E11.9). 1 each 0    Continuous Blood Gluc Sensor (FREESTYLE LIBRE 3 SENSOR) MISC 1 application by Does not apply route 2 (two) times daily. Place 1 sensor on the skin every 14 days. Use to  check glucose continuously (Patient not taking: Reported on 09/20/2023) 2 each 5    Dulaglutide  (TRULICITY ) 1.5 MG/0.5ML SOAJ Inject 1.5 mg into the skin once a week. (Patient not taking: Reported on 09/20/2023) 2 mL 5    empagliflozin  (JARDIANCE ) 10 MG TABS tablet Take 1 tablet (10 mg total) by mouth daily before breakfast. (Patient not taking: Reported on 09/20/2023) 90 tablet 3    gabapentin  (NEURONTIN ) 300 MG capsule Take 1 capsule (300 mg total) by mouth 3 (three) times daily. (Patient not taking: Reported on 09/20/2023) 270 capsule 0    insulin  glargine (LANTUS  SOLOSTAR) 100 UNIT/ML Solostar Pen Inject 15 Units into the skin daily. (Patient not taking: Reported on 09/20/2023) 3 mL 5    Insulin  Pen Needle (PEN NEEDLES) 32G X 4 MM MISC  Use as directed with insulin  pen 100 each 3    ketoconazole (NIZORAL) 2 % cream Apply 1 Application topically daily. 60 g 2    silver  sulfADIAZINE  (SILVADENE ) 1 % cream Apply 1 Application topically daily. 50 g 0    Testosterone  Enanthate (XYOSTED ) 100 MG/0.5ML SOAJ Inject 0.5 mLs (100 mg total) into the skin once a week. 1.96 mL 5    venlafaxine  XR (EFFEXOR -XR) 150 MG 24 hr capsule Take 1 capsule (150 mg total) by mouth daily. (Patient not taking: Reported on 09/20/2023) 30 capsule 2      Social History: @IPSOCHX @  Family History: Family History  Problem Relation Age of Onset   Stroke Maternal Grandmother    Colon cancer Neg Hx    Esophageal cancer Neg Hx    Rectal cancer Neg Hx    Stomach cancer Neg Hx     Review of Systems: As per HPI. A complete 10 point review of systems was performed and all other systems reviewed were negative.  Physical Exam: Vitals:   09/21/23 0929  BP: (!) 128/95  Pulse: (!) 111  Temp: (!) 96.9 F (36.1 C)  SpO2: 99%    Lower Extremity Physical Exam  Vasc: Dp and PT pulses 2+ Neuro: Sensation absent to toes MSK: edema of left forefoot Derm: Healing ulcer left 2nd toe without evidence of active infection. Ulceration with  granular tissue bed sub 3rd met head. Lungs: Unlabored, no cough/wheezing Heart: RRR     Labs:  - Reviewed prior to examination  Radiology:  - Reviewed prior to examination  Assessment: DEUNTE CRITCHER is 43 y.o. male with plantar left forefoot ulceration concern for underlying osteomyelitis of the 3rd met head. Also with healing 2nd toe ulcer.   Plan: - Will proceed with surgical management debridement of left foot ulceration and 3rd met head resection. Possible abx beads or graft application. . - Procedure, benefits, risks, and alternatives discussed with patient and informed consent obtained. - Prophylactic antibiotics: 1 gm vancomycin . - Surgical site marked.  Karlene Overcast Zyia Kaneko

## 2023-09-21 NOTE — Op Note (Signed)
 Full Operative Report  Date of Operation: 11:07 AM, 09/21/2023   Patient: [name] - 43 y.o. male  Surgeon: Evertt Hoe, DPM   Assistant: None  Diagnosis: Osteomyelitis third metatarsal head, ulceration left foot with sub cutaneous fat exposed  Procedure:  1.  Third metatarsal head resection, left foot 2.  Irrigation debridement of ulceration with prep for graft, left foot 3.  Application dermal allograft 38 cm, left foot    Anesthesia: General  Piscitello, Portia Brittle, MD  Anesthesiologist: Piscitello, Portia Brittle, MD CRNA: Simona Dublin, CRNA   Estimated Blood Loss: Minimal   Hemostasis: 1) Anatomical dissection, mechanical compression, electrocautery 2) ankle tourniquet inflated 250 mmHg 15 minutes  Implants: Implant Name Type Inv. Item Serial No. Manufacturer Lot No. LRB No. Used Action  GRAFT SKIN WND MICRO 38 - [id number] Tissue GRAFT SKIN WND MICRO 38  [employer] [id number] Left 1 Implanted    Materials: Prolene 3-0 skin staples  Injectables: 1) Pre-operatively: 10 cc of 0.5% marcaine  plain 2) Post-operatively: None   Specimens: - Pathology: Third metatarsal head - Microbiology: Deep tissue culture left foot   Antibiotics: 1 g vancomycin  given preoperatively  Drains: None  Complications: Patient tolerated the procedure well without complication.   Operative findings: As below in detailed report  Indications for Procedure: [name] presents to Evertt Hoe, DPM with a chief complaint of chronic ulceration subthird metatarsal head with concern for underlying osteomyelitis.   the patient has failed conservative treatments of various modalities. At this time the patient has elected to proceed with surgical correction. All alternatives, risks, and complications of the procedures were thoroughly explained to the patient. Patient exhibits appropriate understanding of all discussion points and informed consent was signed and  obtained in the chart with no guarantees to surgical outcome given or implied.  Description of Procedure: Patient was brought to the operating room. Patient remained on their hospital bed in the supine position. A surgical timeout was performed and all members of the operating room, the procedure, and the surgical site were identified. anesthesia occurred as per anesthesia record. Local anesthetic as previously described was then injected about the operative field in a local infiltrative block.  The operative lower extremity as noted above was then prepped and draped in the usual sterile manner. The following procedure then began.  Esmarch was used to exsanguinate the foot and tourniquet was inflated.  Attention was directed to the dorsal aspect of the third metatarsal phalangeal joint and third metatarsal distally.  15 blade was used to make incision overlying the distal aspect of the third metatarsal including the head and the third MPJ.  Dissection was carried down to the third MPJ which was incised and the third metatarsal head was exposed.  15 blade was used to incise the periosteal tissue and expose the distal aspect of the third met.  There was no obvious infection about the third metatarsal no abscess was identified.  Sagittal saw was then used to make transverse osteotomy at the metaphyseal region of the third metatarsal.  The third metatarsal head was then resected with 15 blade and passed the back table was sent for pathology.  Deep tissue culture was then obtained from the surgical cavity with rongeur.  This was sent for aerobic anaerobic culture.  There was no abscess identified no purulence and no evidence of gross infection.  The surgical site was then irrigated with 1/2 L of sterile saline via power pulse lavage  Attention was  then directed to the plantar ulceration of the plantar forefoot.  Measured approximately 1 x 2 cm.  There was healthy granular tissue in the wound bed.  The wound was  debrided in preparation for graft application.  15 blade was used to excisionally debride of any fibrotic tissue overlying the wound bed.  This was carried to subcutaneous fat layer.  There was no deep probing aspect into the surgical cavity.  Hemostasis was achieved with electrocautery.  Another 1/2 L of sterile saline was used via power pulse lavage to irrigate the wound.  Then to add structure to the wound to promote healing and fill dead space Kerecis micronized dermal allograft 38 cm was then implanted within the surgical site approximately half the graft was used in the surgical cavity and the remainder was mixed with a small amount of saline this was placed overlying the plantar ulceration.  No graft was wasted.  The graft that was placed over the plantar ulceration was adhered with Adaptic and bolster dressing.  Then the dorsal incision was closed with Prolene 3-0 and skin staples.  The surgical site was then dressed with Xeroform dorsally and Adaptic overlying the graft plantarly with 4 x 4 gauze ABD pad Kerlix and Ace roll. The patient tolerated both the procedure and anesthesia well with vital signs stable throughout. The patient was transferred in good condition and all vital signs stable  from the OR to recovery under the discretion of anesthesia.  Condition: Vital signs stable, neurovascular status unchanged from preoperative   Surgical plan:  No evidence of remaining osseous or soft tissue infection.  Plantar ulceration appears very healthy and believe continue local wound care as well as pressure relief from third metatarsal head resection should facilitate healing of this ulceration.  Nonweightbearing left foot and defender boot.  Short course of Augmentin.  The patient will be nonweightbearing in a cam boot to the operative limb until further instructed. The dressing is to remain clean, dry, and intact. Will continue to follow unless noted elsewhere.   Russ Course, DPM [facility]

## 2023-09-21 NOTE — Anesthesia Postprocedure Evaluation (Signed)
 Anesthesia Post Note  Patient: [name]  Procedure(s) Performed: EXCISION, METATARSAL BONE, HEAD and graft application (Left: Foot)  Patient location during evaluation: PACU Anesthesia Type: General Level of consciousness: awake and alert Pain management: pain level controlled Vital Signs Assessment: post-procedure vital signs reviewed and stable Respiratory status: spontaneous breathing, nonlabored ventilation, respiratory function stable and patient connected to nasal cannula oxygen Cardiovascular status: blood pressure returned to baseline and stable Postop Assessment: no apparent nausea or vomiting Anesthetic complications: no   There were no known notable events for this encounter.   Last Vitals:  Vitals:   09/21/23 1145 09/21/23 1154  BP: 119/86 125/80  Pulse: 100 96  Resp: 17 16  Temp: 36.4 C 36.4 C  SpO2: 97% 98%    Last Pain:  Vitals:   09/21/23 1154  TempSrc: Temporal  PainSc: 0-No pain                 Portia Brittle Simora Dingee

## 2023-09-21 NOTE — Transfer of Care (Signed)
 Immediate Anesthesia Transfer of Care Note  Patient: DREDAN TIMBERS  Procedure(s) Performed: EXCISION, METATARSAL BONE, HEAD and graft application (Left: Foot)  Patient Location: PACU  Anesthesia Type:General  Level of Consciousness: awake and drowsy  Airway & Oxygen Therapy: Patient Spontanous Breathing  Post-op Assessment: Report given to RN and Post -op Vital signs reviewed and stable  Post vital signs: Reviewed and stable  Last Vitals:  Vitals Value Taken Time  BP    Temp    Pulse 105 09/21/23 1113  Resp 13 09/21/23 1113  SpO2 95 % 09/21/23 1113  Vitals shown include unfiled device data.  Last Pain:  Vitals:   09/21/23 0929  TempSrc: Temporal         Complications: There were no known notable events for this encounter.

## 2023-09-21 NOTE — Anesthesia Preprocedure Evaluation (Signed)
 Anesthesia Evaluation  Patient identified by MRN, date of birth, ID band Patient awake    Reviewed: Allergy & Precautions, NPO status , Patient's Chart, lab work & pertinent test results  Airway Mallampati: III  TM Distance: <3 FB Neck ROM: full    Dental  (+) Chipped   Pulmonary neg shortness of breath, sleep apnea , former smoker   Pulmonary exam normal        Cardiovascular hypertension, (-) angina (-) Past MI negative cardio ROS Normal cardiovascular exam     Neuro/Psych Seizures -, Well Controlled,  PSYCHIATRIC DISORDERS       Neuromuscular disease    GI/Hepatic Neg liver ROS,GERD  Controlled,,  Endo/Other  diabetes, Type 2    Renal/GU negative Renal ROS  negative genitourinary   Musculoskeletal   Abdominal   Peds  Hematology negative hematology ROS (+)   Anesthesia Other Findings Past Medical History: No date: Allergies No date: Allergy No date: Anemia No date: Anxiety No date: Arthritis No date: DDD (degenerative disc disease), lumbar     Comment:  cervical No date: Depression No date: Diabetes mellitus No date: Epilepsy (HCC) No date: GERD (gastroesophageal reflux disease) No date: Hyperlipidemia No date: Hypertension No date: Lumbar disc herniation No date: Neuromuscular disorder (HCC)     Comment:  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  No date: Pneumonia No date: PTSD (post-traumatic stress disorder) No date: Scoliosis No date: Sleep apnea  Past Surgical History: 05/05/2016: LUMBAR LAMINECTOMY/DECOMPRESSION MICRODISCECTOMY; Left     Comment:  Procedure: CENTRAL DECOMPRESSION L4-L5 AND FORAMINOTOMY               FOR L4 ROOT AND L5 ROOT ON THE LEFT;  Surgeon: Hazle Lites, MD;  Location: [facility];  Service: Orthopedics;                Laterality: Left; No date: NO PAST SURGERIES 04/2023: ulcer excision and I&D of abscess by Podiatry      Comment:  [facility]  BMI    Body Mass Index:  38.12 kg/m      Reproductive/Obstetrics negative OB ROS                             Anesthesia Physical Anesthesia Plan  ASA: 3  Anesthesia Plan: General   Post-op Pain Management:    Induction: Intravenous  PONV Risk Score and Plan: Propofol  infusion and TIVA  Airway Management Planned: Natural Airway and Nasal Cannula  Additional Equipment:   Intra-op Plan:   Post-operative Plan:   Informed Consent: I have reviewed the patients History and Physical, chart, labs and discussed the procedure including the risks, benefits and alternatives for the proposed anesthesia with the patient or authorized representative who has indicated his/her understanding and acceptance.     Dental Advisory Given  Plan Discussed with: Anesthesiologist, CRNA and Surgeon  Anesthesia Plan Comments: (Patient consented for risks of anesthesia including but not limited to:  - adverse reactions to medications - risk of airway placement if required - damage to eyes, teeth, lips or other oral mucosa - nerve damage due to positioning  - sore throat or hoarseness - Damage to heart, brain, nerves, lungs, other parts of body or loss of life  Patient voiced understanding and assent.)       Anesthesia Quick Evaluation

## 2023-09-22 ENCOUNTER — Encounter: Payer: Self-pay | Admitting: Podiatry

## 2023-09-23 LAB — SURGICAL PATHOLOGY

## 2023-09-26 LAB — AEROBIC/ANAEROBIC CULTURE W GRAM STAIN (SURGICAL/DEEP WOUND)
Culture: NO GROWTH
Gram Stain: NONE SEEN

## 2023-09-27 ENCOUNTER — Other Ambulatory Visit (INDEPENDENT_AMBULATORY_CARE_PROVIDER_SITE_OTHER): Admitting: Pharmacist

## 2023-09-27 DIAGNOSIS — E1165 Type 2 diabetes mellitus with hyperglycemia: Secondary | ICD-10-CM

## 2023-09-27 DIAGNOSIS — E1159 Type 2 diabetes mellitus with other circulatory complications: Secondary | ICD-10-CM

## 2023-09-27 MED ORDER — OZEMPIC (0.25 OR 0.5 MG/DOSE) 2 MG/3ML ~~LOC~~ SOPN
0.2500 mg | PEN_INJECTOR | SUBCUTANEOUS | 1 refills | Status: DC
Start: 2023-09-27 — End: 2023-10-06

## 2023-09-27 MED ORDER — LANTUS SOLOSTAR 100 UNIT/ML ~~LOC~~ SOPN: 30.0000 [IU] | PEN_INJECTOR | Freq: Every day | SUBCUTANEOUS | 2 refills | Status: DC

## 2023-09-27 MED ORDER — FREESTYLE LIBRE 3 SENSOR MISC: 5 refills | Status: DC

## 2023-09-27 NOTE — Progress Notes (Signed)
 09/27/2023 Name: [name] MRN: [id number] DOB: 10-04-1980  Chief Complaint  Patient presents with   Diabetes   Medication Management    [name] is a 43 y.o. year old male who presented for a telephone visit.   They were referred to the pharmacist by their PCP for assistance in managing diabetes.   Subjective:  Care Team: Primary Care Provider: Elvira Hammersmith, MD ; Next Scheduled Visit: not scheduled  Medication Access/Adherence  Current Pharmacy:  [id number] [place], [place] - [address] AT [facility] [address] [place] [address] Phone: [phone] Fax: [phone]  [facility] [address] [place] [address] Phone: [phone] Fax: [phone]  [id number] [place], [place] - [address] AT [name] [address] [place] [address] Phone: [phone] Fax: [phone]  [facility] [place], [place] - [address] [place] [address] Phone: [phone] Fax: [phone]   Patient reports affordability concerns with their medications: No  Patient reports access/transportation concerns to their pharmacy: No  Patient reports adherence concerns with their medications:  Yes     Diabetes:  Current medications: glipizide  10 mg twice daily Medications tried in the past: metformin   **Has not been on Trulicity  since end of last year. Was all the way up to 4.5 mg previously with no side effects noted. **Has not taken Lantus  in at least a year per patient. He was also on Novolog  at one point. ** Jardiance  is on his medication list but he has never started it  Current glucose readings: not checking at home. He used the [employer] at one point and prefers to use CGM   Patient denies hypoglycemic s/sx including dizziness, shakiness,  sweating.  Patient reports hyperglycemic symptoms including polyuria, polydipsia, polyphagia, nocturia, neuropathy, blurred vision.   Current medication access support: Medicaid   Objective:  Lab Results  Component Value Date   HGBA1C 12.4 (A) 07/11/2023    Lab Results  Component Value Date   CREATININE 0.92 09/09/2023   BUN 13 09/09/2023   NA 134 (L) 09/09/2023   K 4.2 09/09/2023   CL 95 (L) 09/09/2023   CO2 29 09/09/2023    Lab Results  Component Value Date   CHOL 145 07/11/2023   HDL 32.20 (L) 07/11/2023   LDLCALC 34 07/11/2023   LDLDIRECT 46.0 11/09/2022   TRIG 396.0 (H) 07/11/2023   CHOLHDL 5 07/11/2023    Medications Reviewed Today     Reviewed by [name], RPH (Pharmacist) on 09/27/23 at 1030  Med List Status: <None>   Medication Order Taking? Sig Documenting Provider Last Dose Status Informant  acetaminophen  (TYLENOL ) 500 MG tablet [id number]  Take 1,000 mg by mouth every 6 (six) hours as needed for mild pain. [provider]  Active Self  amLODipine  (NORVASC ) 10 MG tablet [id number]  TAKE 1 TABLET BY MOUTH EVERY DAY [name], Gerald Jose, MD  Active   atomoxetine  (STRATTERA ) 40 MG capsule [id number]  Take 1 capsule (40 mg total) by mouth daily.  Patient not taking: Reported on 09/20/2023   Gerald Herb, MD  Active Self  blood glucose meter kit and supplies [id number]  Dispense based on patient and insurance preference. Use up to four times daily as directed. (FOR ICD-10 E10.9, E11.9). [name] Landau, MD  Active  Med Note [name], CYNTHIA A   Tue Jun 10, 2020  9:14 AM) sometimes  Continuous Blood Gluc Sensor (FREESTYLE LIBRE 3 SENSOR) MISC [id number]  1 application by Does not apply route 2 (two) times daily. Place 1 sensor on the skin every 14 days. Use to check glucose continuously  Patient not taking: Reported on 09/20/2023   Gerald Hammersmith, MD  Active Self  cyclobenzaprine  (FLEXERIL ) 10 MG tablet [id number]  TAKE 1 TABLET  BY MOUTH THREE TIMES A DAY AS NEEDED FOR MUSCLE SPASM Gerald Hammersmith, MD  Active   doxycycline  (VIBRA -TABS) 100 MG tablet [id number]  Take 1 tablet (100 mg total) by mouth 2 (two) times daily. [name], DPM  Active   Dulaglutide  (TRULICITY ) 1.5 MG/0.5ML Stevens [name] [id number] No Inject 1.5 mg into the skin once a week.  Patient not taking: Reported on 09/20/2023   Gerald Hammersmith, MD Not Taking Active Self  empagliflozin  (JARDIANCE ) 10 MG TABS tablet [id number] No Take 1 tablet (10 mg total) by mouth daily before breakfast.  Patient not taking: Reported on 09/20/2023   [name], Gerald Jose, MD Not Taking Active Self  gabapentin  (NEURONTIN ) 300 MG capsule [id number]  Take 1 capsule (300 mg total) by mouth 3 (three) times daily.  Patient not taking: Reported on 09/20/2023   [name], Gerald Jose, MD  Active Self  glipiZIDE  (GLUCOTROL ) 10 MG tablet [id number] Yes TAKE 1 TABLET (10 MG TOTAL) BY MOUTH TWICE A DAY BEFORE A MEAL Gerald Hammersmith, MD Taking Active   hydrOXYzine  (ATARAX ) 10 MG tablet [id number]  TAKE 1 TABLET (10 MG TOTAL) BY MOUTH AT BEDTIME AS NEEDED FOR ANXIETY Gerald Herb, MD  Active            Med Note [name]   Mon Jul 11, 2023  3:50 PM) As needed   insulin  glargine (LANTUS  SOLOSTAR) 100 UNIT/ML Solostar Pen [id number] No Inject 15 Units into the skin daily.  Patient not taking: Reported on 09/20/2023   Gerald Hammersmith, MD Not Taking Active Self           Med Note [name] Apr 18, 2023 10:26 AM) 06/18/22: Reports during TOC call taking 40 U q HS-- dose changed/ prescribed after discharge from [facility] on 04/15/23  Insulin  Pen Needle (PEN NEEDLES) 32G X 4 MM MISC [id number]  Use as directed with insulin  pen Gerald Hammersmith, MD  Active   ketoconazole  (NIZORAL ) 2 % cream [id number]  Apply 1 Application topically daily. [name], DPM  Active   loratadine (CLARITIN) 10 MG tablet [id number]  Take 10 mg by mouth as needed for allergies.  [provider]  Active   losartan -hydrochlorothiazide (HYZAAR) 100-12.5 MG tablet [id number]  TAKE 1 TABLET BY MOUTH EVERY DAY [name], Gerald Margo, MD  Active   mirtazapine  (REMERON ) 15 MG tablet [id number]  Take 1 tablet (15 mg total) by mouth at bedtime.  Patient taking differently: Take 15 mg by mouth as needed.   Gerald Herb, MD  Active Self  omeprazole  (PRILOSEC) 40 MG capsule [id number]  TAKE 1 CAPSULE (40 MG TOTAL) BY MOUTH 2 (TWO) TIMES DAILY BEFORE A MEAL. Kenney Peacemaker, MD  Active   OXcarbazepine  (TRILEPTAL ) 150 MG tablet [id number]  Take 1 tablet (150 mg total) by mouth 2 (two) times daily. Cassandra Cleveland, MD  Expired 09/21/23 2359   oxyCODONE -acetaminophen  (PERCOCET) 5-325 MG tablet [id number]  Take 1 tablet by mouth every 4 (four) hours as  needed for severe pain (pain score 7-10). Standiford, Karlene Overcast, [place]  Active   rosuvastatin  (CRESTOR ) 20 MG tablet [id number]  TAKE 1 TABLET BY MOUTH EVERY DAY Gerald Hammersmith, MD  Active   sildenafil  (VIAGRA ) 100 MG tablet [id number]  TAKE 1/2 - 1 TABLET BY MOUTH DAILY AS NEEDED FOR ERECTILE DYSFUNCTION Gerald Hammersmith, MD  Active   silver  sulfADIAZINE  (SILVADENE ) 1 % cream [id number]  Apply 1 Application topically daily. [name], DPM  Active   tadalafil  (CIALIS ) 20 MG tablet [id number]  Take 0.5-1 tablets (10-20 mg total) by mouth every other day as needed for erectile dysfunction. [name], Gerald Jose, MD  Active   Testosterone  Enanthate (XYOSTED ) 100 MG/0.5ML Stevens [name] [id number]  Inject 0.5 mLs (100 mg total) into the skin once a week. [name], Gerald Jose, MD  Active   venlafaxine  XR (EFFEXOR -XR) 150 MG 24 hr capsule [id number]  Take 1 capsule (150 mg total) by mouth daily.  Patient not taking: Reported on 09/20/2023   Gerald Herb, MD  Expired 09/06/23 2359 Self              Assessment/Plan:   Diabetes: - Currently uncontrolled, A1c goal <7% - Reviewed long term cardiovascular and renal outcomes of  uncontrolled blood sugar - Recommend to change Trulicity  to Ozempic - submitted PA (Key: [id number]), approved. Start Ozempic 0.25 mg SQ once weekly x4 weeks then increase to 0.5 mg weekly  - Start Lantus  30 units daily - Submitted PA for [employer] 3 Plus sensors. (Key: [id number]) Approved. Sent Rx. - recommend holding off on starting Jardiance  due to increased risk of DKA in A1c >10%   Follow Up Plan: 5/29  Rainelle Bur, PharmD, BCPS, CPP Clinical Pharmacist Practitioner [place] [facility] at [facility] [phone]

## 2023-09-27 NOTE — Patient Instructions (Signed)
 It was a pleasure speaking with you today!  Plan for your diabetes management: -Start Ozempic 0.25 mg once weekly for 4 weeks, then increase to 0.5 mg weekly. - Start Lantus  30 units once daily - Start using [employer] 3 Plus sensors and share you data with me to better adjust your medications.  Feel free to call with any questions or concerns!  Rainelle Bur, PharmD, BCPS, CPP Clinical Pharmacist Practitioner [place] [facility] at [facility] [phone]

## 2023-09-29 ENCOUNTER — Ambulatory Visit (INDEPENDENT_AMBULATORY_CARE_PROVIDER_SITE_OTHER): Admitting: Podiatry

## 2023-09-29 DIAGNOSIS — Z9889 Other specified postprocedural states: Secondary | ICD-10-CM

## 2023-09-29 DIAGNOSIS — M86172 Other acute osteomyelitis, left ankle and foot: Secondary | ICD-10-CM

## 2023-09-29 DIAGNOSIS — L97522 Non-pressure chronic ulcer of other part of left foot with fat layer exposed: Secondary | ICD-10-CM

## 2023-09-29 MED ORDER — MUPIROCIN 2 % EX OINT: 1.0000 | TOPICAL_OINTMENT | Freq: Every day | CUTANEOUS | 0 refills | Status: AC

## 2023-09-29 NOTE — Progress Notes (Signed)
  Subjective:  Patient ID: BARUCH LEWERS, male    DOB: 06-09-1980,  MRN: [id number]  Chief Complaint  Patient presents with   Post-op Follow-up    Reports increased pain since surgery, which he attributes to increased sensation and feeling since having the surgery. Staples intact. Some redness and swelling in lower extremity. He has finished the Percocet prescription. Wearing cam boot all the time. He is doing as little weight-bearing as possible.     DOS: 09/21/2023 Procedure: 1.  Third metatarsal head resection, left foot 2.  Irrigation debridement of ulceration with prep for graft, left foot 3.  Application dermal allograft 38 cm, left foot  43 y.o. male seen for post op check.  Patient reports he has been doing well he does have some pain in the left foot he has been trying to stay off his foot using a defender walking boot.  Finishes course of antibiotics and Percocet.  Review of Systems: Negative except as noted in the HPI. Denies N/V/F/Ch.   Objective:   Constitutional Well developed. Well nourished.  Vascular Foot warm and well perfused. Capillary refill normal to all digits.   No calf pain with palpation  Neurologic Normal speech. Oriented to person, place, and time. Epicritic sensation diminished to foot  Dermatologic Dorsal foot incision well coapted without evidence of dehiscence drainage or residual infection.  Plantar foot wound with healthy granular tissue bed with some hyperkeratotic tissue margins.  Debrided to healthy bleeding base approximately dime size.   Orthopedic: Status post third metatarsal head resection   Radiographs: 09/21/2023 postop XR 2 views left foot: 1. Interval resection of the distal third metatarsal. 2. Healing proximal fourth and fifth metatarsal fractures.  Pathology: 1. 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 Foot ulcer with fat layer exposed, left (HCC)   2. Other acute osteomyelitis of left foot (HCC)   3. Post-operative state     Plan:  Patient was evaluated and treated and all questions answered.  1 week s/p third metatarsal head resection left foot with graft application -Progressing well postoperatively.  Wound appears healthy without evidence of necrotic tissues.  Debrided down to healthy base. -Continue local wound care with collagen dressing to the plantar foot wound.  Cover with dry gauze gauze roll and Ace wrap change 3 times weekly -XR: Expected postoperative changes -WB Status: Minimal weightbearing as tolerated in postop shoe -Sutures: Remain intact. -Medications/ABX: No further antibiotics indicated -Dressing: Dressing changes 3 times weekly as above - F/u Plan: Follow-up in 1 week        Maridee Shoemaker, DPM [facility] / [facility]

## 2023-09-30 ENCOUNTER — Encounter: Payer: Self-pay | Admitting: Podiatry

## 2023-09-30 ENCOUNTER — Other Ambulatory Visit: Payer: Self-pay | Admitting: Podiatry

## 2023-09-30 MED ORDER — OXYCODONE-ACETAMINOPHEN 5-325 MG PO TABS: 1.0000 | ORAL_TABLET | ORAL | 0 refills | Status: AC | PRN

## 2023-09-30 NOTE — Progress Notes (Signed)
Refill of percocet sent.

## 2023-10-06 ENCOUNTER — Ambulatory Visit (INDEPENDENT_AMBULATORY_CARE_PROVIDER_SITE_OTHER): Admitting: Podiatry

## 2023-10-06 ENCOUNTER — Other Ambulatory Visit (INDEPENDENT_AMBULATORY_CARE_PROVIDER_SITE_OTHER): Admitting: Pharmacist

## 2023-10-06 ENCOUNTER — Ambulatory Visit (HOSPITAL_COMMUNITY)
Admission: RE | Admit: 2023-10-06 | Discharge: 2023-10-06 | Disposition: A | Discharge status: Home / Self Care | Source: Ambulatory Visit | Attending: Podiatry | Admitting: Podiatry

## 2023-10-06 DIAGNOSIS — L97522 Non-pressure chronic ulcer of other part of left foot with fat layer exposed: Secondary | ICD-10-CM

## 2023-10-06 DIAGNOSIS — M86172 Other acute osteomyelitis, left ankle and foot: Secondary | ICD-10-CM

## 2023-10-06 DIAGNOSIS — E1165 Type 2 diabetes mellitus with hyperglycemia: Secondary | ICD-10-CM

## 2023-10-06 DIAGNOSIS — Z9889 Other specified postprocedural states: Secondary | ICD-10-CM

## 2023-10-06 LAB — VAS US PAD ABI
Left ABI: 1.33
Right ABI: 1.22

## 2023-10-06 MED ORDER — AMOXICILLIN-POT CLAVULANATE 875-125 MG PO TABS: 1.0000 | ORAL_TABLET | Freq: Two times a day (BID) | ORAL | 0 refills | Status: DC

## 2023-10-06 MED ORDER — OZEMPIC (0.25 OR 0.5 MG/DOSE) 2 MG/3ML ~~LOC~~ SOPN: 0.5000 mg | PEN_INJECTOR | SUBCUTANEOUS | 1 refills | Status: DC

## 2023-10-06 NOTE — Progress Notes (Signed)
  Subjective:  Patient ID: [name], male    DOB: March 25, 1981,  MRN: [id number]  Chief Complaint  Patient presents with   Post-op Follow-up    Patient is concerned about possible infection due to redness and drainage from surgical site. Denies systemic symptoms (fever)    DOS: 09/21/2023 Procedure: 1.  Third metatarsal head resection, left foot 2.  Irrigation debridement of ulceration with prep for graft, left foot 3.  Application dermal allograft 38 cm, left foot  43 y.o. male seen for post op check.  Patient concerned about possible infection at the surgical site.  Has noticed some drainage and redness at the dorsal incision.  Not currently taking antibiotics.  Review of Systems: Negative except as noted in the HPI. Denies N/V/F/Ch.   Objective:   Constitutional Well developed. Well nourished.  Vascular Foot warm and well perfused. Capillary refill normal to all digits.   No calf pain with palpation  Neurologic Normal speech. Oriented to person, place, and time. Epicritic sensation diminished to foot  Dermatologic Dorsal foot incision with mild superficial dehiscence after suture and staple removedmild erythema dorsally.  Plantar foot wound with healthy granular tissue bed is much smaller from prior visit much improved with local wound care and offloading after metatarsal head resection   Orthopedic: Status post third metatarsal head resection   Radiographs: 09/21/2023 postop XR 2 views left foot: 1. Interval resection of the distal third metatarsal. 2. Healing proximal fourth and fifth metatarsal fractures.  Pathology: 1. 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 Other acute osteomyelitis of left foot (HCC)   2. Foot ulcer with fat layer exposed, left (HCC)   3. Post-operative state       Plan:  Patient was evaluated and treated and all questions answered.  2 week s/p third metatarsal head resection left foot with graft application -Progressing well postoperatively.  Superficial dehiscence of the dorsal incision and drainage is likely due to [facility] graft application.  However the plantar ulceration is healing very nicely is much smaller with offloading wound care -Continue local wound care with collagen dressing to the plantar foot wound.  Cleanse the dorsal incision with Betadine and then apply dry bandage adhesive versus gauze.  For plantar wound cover with dry gauze or adhesive bandage gauze roll and Ace wrap change 3 times weekly.  -XR: Expected postoperative changes -WB Status: Minimal weightbearing as tolerated in defender boot -Sutures: Remain dorsal Staples and suture today -Medications/ABX: E Rx for Augmentin  10 days given mild erythema dorsally -Dressing: Dressing changes 3 times weekly as above - F/u Plan: Follow-up in 2 week        Maridee Shoemaker, DPM [facility] / [facility]

## 2023-10-06 NOTE — Progress Notes (Signed)
 10/06/2023 Name: [name] MRN: [id number] DOB: 03-30-1981  Chief Complaint  Patient presents with   Diabetes   Medication Management    [name] is a 43 y.o. year old male who presented for a telephone visit.   They were referred to the pharmacist by their PCP for assistance in managing diabetes.   Subjective:  Care Team: Primary Care Provider: Elvira Hammersmith, MD ; Next Scheduled Visit: not scheduled  Medication Access/Adherence  Current Pharmacy:  [id number] [place], [place] - [address] AT [facility] [address]  [place] [address] Phone: [phone] Fax: [phone]  [facility] [address] [place] [address] Phone: [phone] Fax: [phone]  [phone] [place], [place] - [address] AT [facility] [address] [place] [address] Phone: [phone] Fax: [phone]  [facility] - [place], [place] - [address] [place] [address] Phone: [phone] Fax: [phone]   Patient reports affordability concerns with their medications: No  Patient reports access/transportation concerns to their pharmacy: No  Patient reports adherence concerns with their medications:  Yes    Diabetes:  Current medications: glipizide  10 mg twice daily, Ozempic  0.25 mg daily (has taken 2 injections) Medications tried in the past: metformin , Novolog   -Has not started Lantus . He states the pharmacy has not gotten it ready for pick up yet.  **Has not been on Trulicity  since end of last year. Was all the way up to 4.5 mg previously with no side effects noted.   Current glucose readings: Got pt set up with [facility]    Patient denies hypoglycemic s/sx including dizziness, shakiness, sweating.  Patient reports hyperglycemic symptoms including  polyuria, polydipsia, polyphagia, nocturia, neuropathy, blurred vision.   Current medication access support: Medicaid  Diet: Water/Pepsi Zero, Orange juice and grape fruit juice a few times week Mostly eating at home  Breakfast: sausage, egg, biscuit, bacon Lunch: not usually eating Dinner:    Objective:  Lab Results  Component Value Date   HGBA1C 12.4 (A) 07/11/2023    Lab Results  Component Value Date   CREATININE 0.92 09/09/2023   BUN 13 09/09/2023   NA 134 (L) 09/09/2023   K 4.2 09/09/2023   CL 95 (L) 09/09/2023   CO2 29 09/09/2023    Lab Results  Component Value Date   CHOL 145 07/11/2023   HDL 32.20 (L) 07/11/2023   LDLCALC 34 07/11/2023   LDLDIRECT 46.0 11/09/2022   TRIG 396.0 (H) 07/11/2023   CHOLHDL 5 07/11/2023    Medications Reviewed Today     Reviewed by Dion Frankel, RPH (Pharmacist) on 10/06/23 at 1328  Med List Status: <None>   Medication Order Taking? Sig Documenting Provider Last Dose Status Informant  acetaminophen  (TYLENOL ) 500 MG tablet [id number]  Take 1,000 mg by mouth every 6 (six) hours as needed for mild pain. [provider]  Active Self  amLODipine  (NORVASC ) 10 MG tablet [id number]  TAKE 1 TABLET BY MOUTH EVERY DAY Elvira Hammersmith, MD  Active   atomoxetine  (STRATTERA ) 40 MG capsule [id number]  Take 1 capsule (40 mg total) by mouth daily. Yves Herb, MD  Active Self  blood glucose meter kit and supplies [id number]  Dispense based on patient and insurance preference. Use up to four times daily as directed. (FOR ICD-10 E10.9, E11.9). Armenta Landau,  MD  Active            Med Note Lenon Radar, CYNTHIA A   Tue Jun 10, 2020  9:14 AM) sometimes  Continuous Glucose Sensor (FREESTYLE LIBRE 3 SENSOR) [place] [id number]  Place 1 sensor on the skin every 15 days. Use to check glucose continuously Elvira Hammersmith, MD  Active   cyclobenzaprine  (FLEXERIL ) 10 MG tablet [id number]  TAKE 1 TABLET BY MOUTH THREE TIMES A DAY AS NEEDED  FOR MUSCLE SPASM Elvira Hammersmith, MD  Active   doxycycline  (VIBRA -TABS) 100 MG tablet [id number]  Take 1 tablet (100 mg total) by mouth 2 (two) times daily. Charity Conch, DPM  Active   gabapentin  (NEURONTIN ) 300 MG capsule [id number]  Take 1 capsule (300 mg total) by mouth 3 (three) times daily. Sagardia, Miguel Jose, MD  Active Self  glipiZIDE  (GLUCOTROL ) 10 MG tablet [id number] Yes TAKE 1 TABLET (10 MG TOTAL) BY MOUTH TWICE A DAY BEFORE A MEAL Elvira Hammersmith, MD Taking Active   hydrOXYzine  (ATARAX ) 10 MG tablet [id number]  TAKE 1 TABLET (10 MG TOTAL) BY MOUTH AT BEDTIME AS NEEDED FOR ANXIETY Yves Herb, MD  Active            Med Note Mont Antis   Mon Jul 11, 2023  3:50 PM) As needed   insulin  glargine (LANTUS  SOLOSTAR) 100 UNIT/ML Solostar Pen [id number] No Inject 30 Units into the skin daily.  Patient not taking: Reported on 10/06/2023   Elvira Hammersmith, MD Not Taking Active            Med Note Lolly Riser, Arrabella Westerman R   Thu Oct 06, 2023  1:28 PM) Pharmacy has not filled yet  Insulin  Pen Needle (PEN NEEDLES) 32G X 4 MM MISC [id number]  Use as directed with insulin  pen Elvira Hammersmith, MD  Active   ketoconazole  (NIZORAL ) 2 % cream [id number]  Apply 1 Application topically daily. Reina Cara, DPM  Active   loratadine (CLARITIN) 10 MG tablet [id number]  Take 10 mg by mouth as needed for allergies. [provider]  Active   losartan -hydrochlorothiazide (HYZAAR) 100-12.5 MG tablet [id number]  TAKE 1 TABLET BY MOUTH EVERY DAY Sagardia, Isidro Margo, MD  Active   mirtazapine  (REMERON ) 15 MG tablet [id number]  Take 1 tablet (15 mg total) by mouth at bedtime.  Patient taking differently: Take 15 mg by mouth as needed.   Yves Herb, MD  Active Self  mupirocin  ointment (BACTROBAN ) 2 % [id number]  Apply 1 Application topically daily. Standiford, Karlene Overcast, [place]  Active   omeprazole  (PRILOSEC) 40 MG capsule [id number]  TAKE 1 CAPSULE (40 MG TOTAL) BY MOUTH 2 (TWO)  TIMES DAILY BEFORE A MEAL. Kenney Peacemaker, MD  Active   OXcarbazepine  (TRILEPTAL ) 150 MG tablet [id number]  Take 1 tablet (150 mg total) by mouth 2 (two) times daily. Cassandra Cleveland, MD  Expired 09/21/23 2359   rosuvastatin  (CRESTOR ) 20 MG tablet [id number]  TAKE 1 TABLET BY MOUTH EVERY DAY Sagardia, Isidro Margo, MD  Active   Semaglutide ,0.25 or 0.5MG /DOS, (OZEMPIC , 0.25 OR 0.5 MG/DOSE,) 2 MG/3ML SOPN [id number] Yes Inject 0.25 mg into the skin once a week. Elvira Hammersmith, MD Taking Active   sildenafil  (VIAGRA ) 100 MG tablet [id number]  TAKE 1/2 - 1 TABLET BY MOUTH DAILY AS NEEDED FOR ERECTILE DYSFUNCTION Elvira Hammersmith, MD  Active   silver  sulfADIAZINE  (SILVADENE ) 1 % cream [id number]  Apply 1 Application topically daily. Charity Conch, DPM  Active   tadalafil  (CIALIS ) 20 MG tablet [id number]  Take 0.5-1 tablets (10-20 mg total) by mouth every other day as needed for erectile dysfunction. Sagardia, Miguel Jose, MD  Active   Testosterone  Enanthate (XYOSTED ) 100 MG/0.5ML [place] [id number]  Inject 0.5 mLs (100 mg total) into the skin once a week. Elvira Hammersmith, MD  Active               Assessment/Plan:   Diabetes: - Currently uncontrolled, A1c goal <7% - Reviewed long term cardiovascular and renal outcomes of uncontrolled blood sugar - Discussed dietary modifications: reduce juice consumption, increase protein/fiber, encouraged balanced meals - Continue Ozempic  0.25 mg weekly. Increase to 0.5 mg weekly on week 5 - Start Lantus  30 units daily - [facility], [facility] informed [facility] in [facility] - Continue using Freestyle Libre to monitor glucose levels - recommend holding off on starting Jardiance  due to increased risk of DKA in A1c >10%   Follow Up Plan: 6/10  Rainelle Bur, PharmD, BCPS, CPP Clinical Pharmacist Practitioner [place] [facility] at [facility] [phone]

## 2023-10-06 NOTE — Patient Instructions (Signed)
 It was a pleasure speaking with you today!  Increase Ozempic to 0.5 mg once weekly after completing 4 weeks of the 0.25 mg.  Pick up Lantus  when it is ready and start 30 units once daily.  Feel free to call with any questions or concerns!  Rainelle Bur, PharmD, BCPS, CPP Clinical Pharmacist Practitioner [place] [facility] at [facility] [phone]

## 2023-10-07 ENCOUNTER — Ambulatory Visit: Payer: Self-pay | Admitting: Podiatry

## 2023-10-11 ENCOUNTER — Encounter: Admitting: Podiatry

## 2023-10-12 ENCOUNTER — Encounter: Payer: Self-pay | Admitting: Podiatry

## 2023-10-12 ENCOUNTER — Other Ambulatory Visit: Payer: Self-pay | Admitting: Podiatry

## 2023-10-12 MED ORDER — DOXYCYCLINE HYCLATE 100 MG PO TABS: 100.0000 mg | ORAL_TABLET | Freq: Two times a day (BID) | ORAL | 0 refills | Status: DC

## 2023-10-12 NOTE — Progress Notes (Signed)
 Rx for doxcycline sent

## 2023-10-13 ENCOUNTER — Other Ambulatory Visit: Payer: Self-pay | Admitting: Emergency Medicine

## 2023-10-13 ENCOUNTER — Other Ambulatory Visit: Payer: Self-pay | Admitting: Neurology

## 2023-10-13 ENCOUNTER — Encounter: Payer: Self-pay | Admitting: Pharmacist

## 2023-10-13 DIAGNOSIS — I10 Essential (primary) hypertension: Secondary | ICD-10-CM

## 2023-10-13 DIAGNOSIS — E785 Hyperlipidemia, unspecified: Secondary | ICD-10-CM

## 2023-10-13 DIAGNOSIS — E1159 Type 2 diabetes mellitus with other circulatory complications: Secondary | ICD-10-CM

## 2023-10-18 ENCOUNTER — Ambulatory Visit (INDEPENDENT_AMBULATORY_CARE_PROVIDER_SITE_OTHER): Admitting: Podiatry

## 2023-10-18 ENCOUNTER — Other Ambulatory Visit (INDEPENDENT_AMBULATORY_CARE_PROVIDER_SITE_OTHER): Admitting: Pharmacist

## 2023-10-18 ENCOUNTER — Other Ambulatory Visit

## 2023-10-18 DIAGNOSIS — E1165 Type 2 diabetes mellitus with hyperglycemia: Secondary | ICD-10-CM

## 2023-10-18 DIAGNOSIS — M86172 Other acute osteomyelitis, left ankle and foot: Secondary | ICD-10-CM

## 2023-10-18 DIAGNOSIS — Z7985 Long-term (current) use of injectable non-insulin antidiabetic drugs: Secondary | ICD-10-CM

## 2023-10-18 DIAGNOSIS — I152 Hypertension secondary to endocrine disorders: Secondary | ICD-10-CM

## 2023-10-18 DIAGNOSIS — L97522 Non-pressure chronic ulcer of other part of left foot with fat layer exposed: Secondary | ICD-10-CM

## 2023-10-18 DIAGNOSIS — Z9889 Other specified postprocedural states: Secondary | ICD-10-CM

## 2023-10-18 MED ORDER — LANTUS SOLOSTAR 100 UNIT/ML ~~LOC~~ SOPN: 40.0000 [IU] | PEN_INJECTOR | Freq: Every day | SUBCUTANEOUS | Status: DC

## 2023-10-18 NOTE — Progress Notes (Signed)
  Subjective:  Patient ID: [name], male    DOB: [birth year],  MRN: [id number]  Chief Complaint  Patient presents with   Routine Post Op    Foot ulcer with fat layer exposed, left  healing well, small visible sore spot ulcer on planar side closed    DOS: 09/21/2023 Procedure: 1.  Third metatarsal head resection, left foot 2.  Irrigation debridement of ulceration with prep for graft, left foot 3.  Application dermal allograft 38 cm, left foot  43 y.o. male seen for post op check.   Patient is now approximately 4 weeks postoperative.  He reports the dorsal incision is healing well and that the plantar ulcer is nearly fully healed.  He has been putting mupirocin  ointment on the plantar ulcer and covering with bandage and a Betadine and Band-Aid dressing on the dorsal incision.  Denies drainage from either spot.  Switch from Augmentin  to doxycycline  reports no side effects at this point in time.  He has about 5 days left.  Review of Systems: Negative except as noted in the HPI. Denies N/V/F/Ch.   Objective:   Constitutional Well developed. Well nourished.  Vascular Foot warm and well perfused. Capillary refill normal to all digits.   No calf pain with palpation  Neurologic Normal speech. Oriented to person, place, and time. Epicritic sensation diminished to foot  Dermatologic Dorsal foot incision with improved healing from prior good infill and nearly fully healed.  Debrided some fibrotic and necrotic tissues from the wound margins and base centrally is nearly fully healed.  Plantar ulceration is also improved with good healthy tissue and fill mild hyperkeratotic border that was debrided.   Orthopedic: Status post third metatarsal head resection   Radiographs: 09/21/2023 postop XR 2 views left foot: 1. Interval resection of the distal third metatarsal. 2. Healing proximal fourth and fifth metatarsal fractures.  Pathology: 1. 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 Other acute osteomyelitis of left foot (HCC)   2. Foot ulcer with fat layer exposed, left (HCC)   3. Post-operative state       Plan:  Patient was evaluated and treated and all questions answered.  4 week s/p third metatarsal head resection left foot with graft application -Progressing well postoperatively.   Wounds are nearly fully healed both dorsally and plantarly -Continue local wound care with mupirocin  ointment and Band-Aid dressing to the plantar foot wound.  Cleanse the dorsal incision with Betadine and then apply dry bandage adhesive versus gauze.  change 3 times weekly.  -XR: Expected postoperative changes -WB Status: Minimal weightbearing as tolerated in Psychologist, forensic -Medications/ABX: Finished course of doxycycline  and then no further antibiotics indicated -Dressing: Dressing changes 3 times weekly as above - F/u Plan: Follow-up in 2 week        Maridee Shoemaker, DPM [facility] / [facility]

## 2023-10-18 NOTE — Patient Instructions (Signed)
 It was a pleasure speaking with you today!  Increase Ozempic  to 0.5 mg next week. Increase Lantus  to 40 units daily. Continue monitoring BG using Freestyle Libre 3.  Feel free to call with any questions or concerns!  Rainelle Bur, PharmD, BCPS, CPP Clinical Pharmacist Practitioner [facility] [phone]

## 2023-10-18 NOTE — Progress Notes (Signed)
 10/18/2023 Name: [name]: [id number] DOB: [birth year]  Chief Complaint  Patient presents with   Diabetes   Medication Management    [name] is a 43 y.o. year old male who presented for a telephone visit.   They were referred to the pharmacist by their PCP for assistance in managing diabetes.   Subjective:  Care Team: Primary Care Provider: Elvira Hammersmith, MD ; Next Scheduled Visit: not scheduled Endocrinology initial visit 12/14/23  Medication Access/Adherence  Current Pharmacy:  [id number] [place], [place] - [address] AT [facility] [address] [place] [address] Phone: [phone] Fax: [phone]  [facility] [address], [address] [place] [address] Phone: [phone] Fax: [phone]  [id number] - [place], [place] - [address] AT [facility] [address] [place] [address] Phone: [phone] Fax: [phone]  [facility] - [place], [place] - [address] [place] [address] Phone: [phone] Fax: [phone]   Patient reports affordability concerns with their medications: No  Patient reports access/transportation concerns to their pharmacy: No  Patient reports adherence concerns with their medications:  Yes    Diabetes:  Current medications: glipizide  10 mg twice daily, Ozempic  0.25 mg daily (has taken 4 injections), Lantus  30 units daily  Medications tried in the past: metformin , Novolog   **Has not been on Trulicity  since end of last year. Was all the way up to 4.5 mg previously with no side effects noted.   Current glucose readings: Got pt set up with [facility]    Patient denies hypoglycemic s/sx including dizziness, shakiness, sweating.  Patient reports hyperglycemic symptoms including polyuria, polydipsia, polyphagia,  nocturia, neuropathy, blurred vision.   Current medication access support: Medicaid  Diet: Water/Pepsi Zero, Orange juice and grape fruit juice a few times week Mostly eating at home  Breakfast: sausage, egg, biscuit, bacon Lunch: not usually eating Dinner:    Objective:  Lab Results  Component Value Date   HGBA1C 12.4 (A) 07/11/2023    Lab Results  Component Value Date   CREATININE 0.92 09/09/2023   BUN 13 09/09/2023   NA 134 (L) 09/09/2023   K 4.2 09/09/2023   CL 95 (L) 09/09/2023   CO2 29 09/09/2023    Lab Results  Component Value Date   CHOL 145 07/11/2023   HDL 32.20 (L) 07/11/2023   LDLCALC 34 07/11/2023   LDLDIRECT 46.0 11/09/2022   TRIG 396.0 (H) 07/11/2023   CHOLHDL 5 07/11/2023    Medications Reviewed Today     Reviewed by Dion Frankel, RPH (Pharmacist) on 10/18/23 at 1150  Med List Status: <None>   Medication Order Taking? Sig Documenting Provider Last Dose Status Informant  acetaminophen  (TYLENOL ) 500 MG tablet [id number]  Take 1,000 mg by mouth every 6 (six) hours as needed for mild pain. [provider]  Active Self  amLODipine  (NORVASC ) 10 MG tablet [id number]  TAKE 1 TABLET BY MOUTH EVERY DAY Sagardia, Miguel Jose, MD  Active   atomoxetine  (STRATTERA ) 40 MG capsule [id number]  Take 1 capsule (40 mg total) by mouth daily. Yves Herb, MD  Expired 10/11/23 2359 Self  blood glucose meter kit and supplies [id number]  Dispense based on patient and insurance preference. Use up to four times daily as directed. (FOR ICD-10 E10.9, E11.9). Armenta Landau, MD  Active  Med Note Lenon Radar, CYNTHIA A   Tue Jun 10, 2020  9:14 AM) sometimes  Continuous Glucose Sensor (FREESTYLE LIBRE 3 SENSOR) [place] [id number] Yes Place 1 sensor on the skin every 15 days. Use to check glucose continuously Elvira Hammersmith, MD Taking Active   cyclobenzaprine  (FLEXERIL ) 10 MG tablet [id number]  TAKE 1 TABLET BY MOUTH THREE TIMES A DAY AS NEEDED FOR MUSCLE  SPASM Elvira Hammersmith, MD  Active   doxycycline  (VIBRA -TABS) 100 MG tablet [id number]  Take 1 tablet (100 mg total) by mouth 2 (two) times daily. Charity Conch, DPM  Active   doxycycline  (VIBRA -TABS) 100 MG tablet [id number]  Take 1 tablet (100 mg total) by mouth 2 (two) times daily for 10 days. Standiford, Karlene Overcast, DPM  Active   gabapentin  (NEURONTIN ) 300 MG capsule [id number]  Take 1 capsule (300 mg total) by mouth 3 (three) times daily. Sagardia, Miguel Jose, MD  Active Self  glipiZIDE  (GLUCOTROL ) 10 MG tablet [id number]  TAKE 1 TABLET (10 MG TOTAL) BY MOUTH TWICE A DAY BEFORE A MEAL Elvira Hammersmith, MD  Active   hydrOXYzine  (ATARAX ) 10 MG tablet [id number]  TAKE 1 TABLET (10 MG TOTAL) BY MOUTH AT BEDTIME AS NEEDED FOR ANXIETY Yves Herb, MD  Active            Med Note Mont Antis   Mon Jul 11, 2023  3:50 PM) As needed   insulin  glargine (LANTUS  SOLOSTAR) 100 UNIT/ML Solostar Pen [id number] Yes Inject 30 Units into the skin daily. Sagardia, Miguel Jose, MD Taking Active            Med Note Lolly Riser, Dade Rodin R   Tue Oct 18, 2023 11:49 AM)    Insulin  Pen Needle (PEN NEEDLES) 32G X 4 MM MISC [id number]  Use as directed with insulin  pen Elvira Hammersmith, MD  Active   ketoconazole  (NIZORAL ) 2 % cream [id number]  Apply 1 Application topically daily. Reina Cara, DPM  Active   loratadine (CLARITIN) 10 MG tablet [id number]  Take 10 mg by mouth as needed for allergies. [provider]  Active   losartan -hydrochlorothiazide (HYZAAR) 100-12.5 MG tablet [id number]  TAKE 1 TABLET BY MOUTH EVERY DAY Sagardia, Miguel Jose, MD  Active   mirtazapine  (REMERON ) 15 MG tablet [id number]  Take 1 tablet (15 mg total) by mouth at bedtime.  Patient taking differently: Take 15 mg by mouth as needed.   Yves Herb, MD  Active Self  mupirocin  ointment (BACTROBAN ) 2 % [id number]  Apply 1 Application topically daily. Standiford, Karlene Overcast, DPM  Active   omeprazole  (PRILOSEC) 40 MG  capsule [id number]  TAKE 1 CAPSULE (40 MG TOTAL) BY MOUTH 2 (TWO) TIMES DAILY BEFORE A MEAL. Kenney Peacemaker, MD  Active   OXcarbazepine  (TRILEPTAL ) 150 MG tablet [id number]  TAKE 1 TABLET BY MOUTH TWICE A DAY Camara, Amadou, MD  Active   rosuvastatin  (CRESTOR ) 20 MG tablet [id number]  TAKE 1 TABLET BY MOUTH EVERY DAY Sagardia, Isidro Margo, MD  Active   Semaglutide ,0.25 or 0.5MG /DOS, (OZEMPIC , 0.25 OR 0.5 MG/DOSE,) 2 MG/3ML SOPN [id number] Yes Inject 0.5 mg into the skin once a week. Elvira Hammersmith, MD Taking Active   sildenafil  (VIAGRA ) 100 MG tablet [id number]  TAKE 1/2 - 1 TABLET BY MOUTH DAILY AS NEEDED FOR ERECTILE DYSFUNCTION Elvira Hammersmith, MD  Active   silver  sulfADIAZINE  (SILVADENE ) 1 % cream [id number]  Apply 1 Application topically daily. Charity Conch, DPM  Active   tadalafil  (  CIALIS ) 20 MG tablet [id number]  Take 0.5-1 tablets (10-20 mg total) by mouth every other day as needed for erectile dysfunction. Sagardia, Miguel Jose, MD  Active   Testosterone  Enanthate (XYOSTED ) 100 MG/0.5ML [place] [id number]  Inject 0.5 mLs (100 mg total) into the skin once a week. Elvira Hammersmith, MD  Active               Assessment/Plan:   Diabetes: - Currently uncontrolled, A1c goal <7% - Average BG remains very elevated but has improved from 366 to 310 since starting Lantus . Expect further BG lowering once increasing to Ozempic  0.5 mg next week - Reviewed long term cardiovascular and renal outcomes of uncontrolled blood sugar - Discussed dietary modifications: reduce juice consumption, increase protein/fiber, encouraged balanced meals - Increase ozempic  to 0.5 mg weekly next week - Increase Lantus  to 40 units daily - Continue using Freestyle Libre to monitor glucose levels - recommend holding off on starting Jardiance  due to increased risk of DKA in A1c >10%   Follow Up Plan: 6/24  Rainelle Bur, PharmD, BCPS, CPP Clinical Pharmacist Practitioner [facility]  [facility] [phone]

## 2023-10-19 ENCOUNTER — Ambulatory Visit (INDEPENDENT_AMBULATORY_CARE_PROVIDER_SITE_OTHER): Admitting: Licensed Clinical Social Worker

## 2023-10-19 ENCOUNTER — Encounter (HOSPITAL_COMMUNITY): Payer: Self-pay | Admitting: Licensed Clinical Social Worker

## 2023-10-19 DIAGNOSIS — F4310 Post-traumatic stress disorder, unspecified: Secondary | ICD-10-CM | POA: Diagnosis not present

## 2023-10-19 DIAGNOSIS — F32A Depression, unspecified: Secondary | ICD-10-CM

## 2023-10-19 DIAGNOSIS — F411 Generalized anxiety disorder: Secondary | ICD-10-CM | POA: Insufficient documentation
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
## 2023-10-26 ENCOUNTER — Telehealth: Payer: Self-pay | Admitting: Pharmacist

## 2023-10-26 NOTE — Progress Notes (Signed)
 Reviewed patient's Libre CGM report to see if insulin  adjustment is needed since last week. Overall, BG average has improved from 310 to 252. Time in Target has improved from 1% to 12% after increasing Lantus  to 40 units. He also started Ozempic  0.5 mg this week as well.  Recommended to patient he increase Lantus  to 50 units daily and continue Ozempic  0.5 mg weekly. Expect more BG lowering with increased Ozempic .   F/u already scheduled for 6/24.  Rainelle Bur, PharmD, BCPS, CPP Clinical Pharmacist Practitioner [facility] at [facility] [phone]

## 2023-10-27 ENCOUNTER — Other Ambulatory Visit: Payer: Self-pay | Admitting: Emergency Medicine

## 2023-11-01 ENCOUNTER — Encounter: Payer: Self-pay | Admitting: Podiatry

## 2023-11-01 ENCOUNTER — Other Ambulatory Visit (INDEPENDENT_AMBULATORY_CARE_PROVIDER_SITE_OTHER): Admitting: Pharmacist

## 2023-11-01 ENCOUNTER — Ambulatory Visit (INDEPENDENT_AMBULATORY_CARE_PROVIDER_SITE_OTHER): Admitting: Podiatry

## 2023-11-01 DIAGNOSIS — L97522 Non-pressure chronic ulcer of other part of left foot with fat layer exposed: Secondary | ICD-10-CM

## 2023-11-01 DIAGNOSIS — M86172 Other acute osteomyelitis, left ankle and foot: Secondary | ICD-10-CM

## 2023-11-01 DIAGNOSIS — Z9889 Other specified postprocedural states: Secondary | ICD-10-CM

## 2023-11-01 DIAGNOSIS — E1165 Type 2 diabetes mellitus with hyperglycemia: Secondary | ICD-10-CM

## 2023-11-01 DIAGNOSIS — I152 Hypertension secondary to endocrine disorders: Secondary | ICD-10-CM

## 2023-11-01 MED ORDER — LANTUS SOLOSTAR 100 UNIT/ML ~~LOC~~ SOPN: 60.0000 [IU] | PEN_INJECTOR | Freq: Every day | SUBCUTANEOUS | 3 refills | Status: DC

## 2023-11-01 NOTE — Progress Notes (Signed)
 11/01/2023 Name: [name] MRN: [id number] DOB: 1981-05-09  Chief Complaint  Patient presents with   Diabetes   Medication Management    [name] is [name] 43 y.o. year old male who presented for [name] telephone visit.   They were referred to the pharmacist by their PCP for assistance in managing diabetes.   Subjective:  Care Team: Primary Care Provider: [place] Gerald Schanz, [name] ; Next Scheduled Visit: not scheduled Endocrinology initial visit 12/14/23  Medication Access/Adherence  Current Pharmacy:  [id number] [place], [place] - [address] AT [facility] [address] [place] [address] Phone: [phone] Fax: [phone]  [facility] - [facility] [address] [place] [address] Phone: [phone] Fax: [phone]  [id number] [place], [place] - [address] AT [facility] [address] [place] [address] Phone: [phone] Fax: [phone]  [facility] [place], [place] - [address] [place] [address] Phone: [phone] Fax: [phone]   Patient reports affordability concerns with their medications: No  Patient reports access/transportation concerns to their pharmacy: No  Patient reports adherence concerns with their medications:  Yes    Diabetes:  Current medications: glipizide  10 mg twice daily, Ozempic  0.5 mg daily (on second week), Lantus  50 units daily  Medications tried in the past: metformin , Novolog   **Has not been on Trulicity  since end of last year. Was all the way up to 4.5 mg previously with no side effects noted.   Current glucose readings: Got pt set up with [facility]     Patient denies hypoglycemic s/sx including dizziness, shakiness, sweating.  Patient reports hyperglycemic symptoms including polyuria, polydipsia, polyphagia,  nocturia, neuropathy, blurred vision.   Current medication access support: Medicaid  Diet: Water/Pepsi Zero, Orange juice and grape fruit juice [name] few times week - notes he has cut back on juice/soda Mostly eating at home  Breakfast: sausage, egg, biscuit, bacon Lunch: not usually eating Dinner:    Objective:  Lab Results  Component Value Date   HGBA1C 12.4 ([name]) 07/11/2023    Lab Results  Component Value Date   CREATININE 0.92 09/09/2023   BUN 13 09/09/2023   NA 134 (L) 09/09/2023   K 4.2 09/09/2023   CL 95 (L) 09/09/2023   CO2 29 09/09/2023    Lab Results  Component Value Date   CHOL 145 07/11/2023   HDL 32.20 (L) 07/11/2023   LDLCALC 34 07/11/2023   LDLDIRECT 46.0 11/09/2022   TRIG 396.0 (H) 07/11/2023   CHOLHDL 5 07/11/2023    Medications Reviewed Today     Reviewed by [name], RPH (Pharmacist) on 11/01/23 at 1046  Med List Status: <None>   Medication Order Taking? Sig Documenting Provider Last Dose Status Informant  acetaminophen  (TYLENOL ) 500 MG tablet [id number]  Take 1,000 mg by mouth every 6 (six) hours as needed for mild pain. Provider, Historical, [name]  Active Self  amLODipine  (NORVASC ) 10 MG tablet [id number]  TAKE 1 TABLET BY MOUTH EVERY DAY Sagardia, Gerald Schanz, [name]  Active   atomoxetine  (STRATTERA ) 40 MG capsule [id number]  Take 1 capsule (40 mg total) by mouth daily. Gerald Arvella CHRISTELLA, [name]  Expired 10/11/23 2359 Self  blood glucose meter kit and supplies [id number]  Dispense based on patient and insurance preference. Use up to four times daily as directed. (FOR ICD-10 E10.9, E11.9). Gerald Sieving  [name], [name]  Active            Med Note [name]   Tue Jun 10, 2020  9:14 AM) sometimes  Continuous Glucose Sensor (FREESTYLE LIBRE 3 SENSOR) [place] [id number]  Place 1 sensor on the skin every 15 days. Use to check glucose continuously Gerald Gerald Schanz, [name]  Active   cyclobenzaprine  (FLEXERIL ) 10 MG tablet [id number]  TAKE 1 TABLET BY MOUTH THREE TIMES  [name] DAY AS NEEDED FOR MUSCLE SPASM Sagardia, Gerald Schanz, [name]  Active   doxycycline  (VIBRA -TABS) 100 MG tablet [id number]  Take 1 tablet (100 mg total) by mouth 2 (two) times daily. [name], DPM  Active   doxycycline  (VIBRA -TABS) 100 MG tablet [id number]  Take 1 tablet (100 mg total) by mouth 2 (two) times daily for 10 days. [name], DPM  Expired 10/22/23 2359   gabapentin  (NEURONTIN ) 300 MG capsule [id number]  Take 1 capsule (300 mg total) by mouth 3 (three) times daily. [name]  Active Self  glipiZIDE  (GLUCOTROL ) 10 MG tablet [id number] Yes TAKE 1 TABLET (10 MG TOTAL) BY MOUTH TWICE [name] DAY BEFORE [name] MEAL Gerald Gerald Schanz, [name]  Active   hydrOXYzine  (ATARAX ) 10 MG tablet [id number]  TAKE 1 TABLET (10 MG TOTAL) BY MOUTH AT BEDTIME AS NEEDED FOR ANXIETY Gerald Arvella HERO, [name]  Active            Med Note [name]   Mon Jul 11, 2023  3:50 PM) As needed   insulin  glargine (LANTUS  SOLOSTAR) 100 UNIT/ML Solostar Pen [id number]  Inject 60 Units into the skin daily. Gerald Gerald Schanz, [name]  Active   Insulin  Pen Needle (PEN NEEDLES) 32G X 4 MM MISC [id number]  Use as directed with insulin  pen [name]  Active   ketoconazole  (NIZORAL ) 2 % cream [id number]  Apply 1 Application topically daily. [name], DPM  Active   loratadine (CLARITIN) 10 MG tablet [id number]  Take 10 mg by mouth as needed for allergies. Provider, Historical, [name]  Active   losartan -hydrochlorothiazide (HYZAAR) 100-12.5 MG tablet [id number]  TAKE 1 TABLET BY MOUTH EVERY DAY Sagardia, Gerald Schanz, [name]  Active   mirtazapine  (REMERON ) 15 MG tablet [id number]  Take 1 tablet (15 mg total) by mouth at bedtime.  Patient taking differently: Take 15 mg by mouth as needed.   Gerald Arvella HERO, [name]  Active Self  mupirocin  ointment (BACTROBAN ) 2 % [id number]  Apply 1 Application topically daily. [name], DPM  Active   omeprazole  (PRILOSEC) 40 MG capsule [id number]  TAKE 1 CAPSULE  (40 MG TOTAL) BY MOUTH 2 (TWO) TIMES DAILY BEFORE [name] MEAL. Gerald Lupita BRAVO, [name]  Active   OXcarbazepine  (TRILEPTAL ) 150 MG tablet [id number]  TAKE 1 TABLET BY MOUTH TWICE [name] DAY Camara, Amadou, [name]  Active   rosuvastatin  (CRESTOR ) 20 MG tablet [id number]  TAKE 1 TABLET BY MOUTH EVERY DAY Sagardia, Gerald Schanz, [name]  Active   Semaglutide ,0.25 or 0.5MG /DOS, (OZEMPIC , 0.25 OR 0.5 MG/DOSE,) 2 MG/3ML SOPN [id number] Yes Inject 0.5 mg into the skin once [name] week. [name]  Active   sildenafil  (VIAGRA ) 100 MG tablet [id number]  TAKE 1/2 - 1 TABLET BY MOUTH DAILY AS NEEDED FOR ERECTILE DYSFUNCTION Gerald Gerald Schanz, [name]  Active   silver  sulfADIAZINE  (SILVADENE ) 1 % cream [id number]  Apply 1 Application topically daily. [name], DPM  Active   tadalafil  (CIALIS ) 20 MG tablet [id number]  Take 0.5-1  tablets (10-20 mg total) by mouth every other day as needed for erectile dysfunction. [name]  Active   Testosterone  Enanthate (XYOSTED ) 100 MG/0.5ML EMMANUEL [id number]  Inject 0.5 mLs (100 mg total) into the skin once [name] week. Gerald Gerald Schanz, [name]  Active               Assessment/Plan:   Diabetes: - Currently uncontrolled, A1c goal <7% - Average BG remains very elevated but has improved from 310 to 237 in the last 2 weeks. Time in target has improved from 1% to 21% - Reviewed long term cardiovascular and renal outcomes of uncontrolled blood sugar - Discussed dietary modifications: reduce juice consumption, increase protein/fiber, encouraged balanced meals - Increase ozempic  to 1 mg weekly after 4 weeks on 0.5 mg - Increase Lantus  to 60 units daily - Continue using Freestyle Libre to monitor glucose levels - recommend holding off on starting Jardiance  due to increased risk of DKA in A1c >10%  Follow Up Plan: 7/15  Darrelyn Drum, PharmD, BCPS, CPP Clinical Pharmacist Practitioner [place] [facility] [phone]

## 2023-11-01 NOTE — Patient Instructions (Signed)
 It was a pleasure speaking with you today!  Continue Ozempic  0.5 mg weekly for 4 more weeks. Increase Lantus  to 60 units daily.  Feel free to call with any questions or concerns!  Darrelyn Drum, PharmD, BCPS, CPP Clinical Pharmacist Practitioner [place] [facility] at [facility] [phone]

## 2023-11-01 NOTE — Progress Notes (Signed)
  Subjective:  Patient ID: [name], male    DOB: [birth year],  MRN: [id number]  Chief Complaint  Patient presents with   Post-op Follow-up    Doing well today. Pain level max at 2/10. No drainage since Saturday. No bandage or other covering for the past couple of days. Wears cam boot at when away from home.     DOS: 09/21/2023 Procedure: 1.  Third metatarsal head resection, left foot 2.  Irrigation debridement of ulceration with prep for graft, left foot 3.  Application dermal allograft 38 cm, left foot  43 y.o. male seen for post op check.   Patient is now approximately 6 weeks postoperative.   He reports the dorsal incision is now fully healed no drainage since this past weekend, wearing defender boot to keep pressure off the left foot plantarly.  Review of Systems: Negative except as noted in the HPI. Denies N/V/F/Ch.   Objective:   Constitutional Well developed. Well nourished.  Vascular Foot warm and well perfused. Capillary refill normal to all digits.   No calf pain with palpation  Neurologic Normal speech. Oriented to person, place, and time. Epicritic sensation diminished to foot  Dermatologic Dorsal foot incision fully healed at this time no evidence of drainage no opening no maceration, some scar tissue Plantar left foot ulceration is fully healed with mild hyperkeratotic tissue subthird metatarsal head this was debrided no ulceration underlying       Orthopedic: Status post third metatarsal head resection   Radiographs: 09/21/2023 postop XR 2 views left foot: 1. Interval resection of the distal third metatarsal. 2. Healing proximal fourth and fifth metatarsal fractures.  Pathology: 1. 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 Other acute osteomyelitis of left foot (HCC)   2.  Foot ulcer with fat layer exposed, left (HCC)   3. Post-operative state      Plan:  Patient was evaluated and treated and all questions answered.  6 week s/p third metatarsal head resection left foot with graft application -Progressing well postoperatively.   Wounds are now fully healed both dorsal and plantar. -Okay to wash foot with warm soapy water as needed at this toe. -Okay to return to work without restrictions - No further wound care needed -XR: Expected postoperative changes -WB Status: Weight bearing as tolerated in regular shoe gear, okay to get out of defender boot -Medications/ABX: None antibiotics indicated -Dressing: No dressing changes required no wounds - F/u Plan: Follow-up in 3 months for [facility]        Marolyn JULIANNA Honour, DPM [facility] / [facility]

## 2023-11-16 ENCOUNTER — Ambulatory Visit (HOSPITAL_COMMUNITY): Admitting: Licensed Clinical Social Worker

## 2023-11-22 ENCOUNTER — Other Ambulatory Visit (INDEPENDENT_AMBULATORY_CARE_PROVIDER_SITE_OTHER): Admitting: Pharmacist

## 2023-11-22 DIAGNOSIS — F901 Attention-deficit hyperactivity disorder, predominantly hyperactive type: Secondary | ICD-10-CM

## 2023-11-22 DIAGNOSIS — E1165 Type 2 diabetes mellitus with hyperglycemia: Secondary | ICD-10-CM

## 2023-11-22 DIAGNOSIS — E1159 Type 2 diabetes mellitus with other circulatory complications: Secondary | ICD-10-CM

## 2023-11-22 DIAGNOSIS — F321 Major depressive disorder, single episode, moderate: Secondary | ICD-10-CM

## 2023-11-22 MED ORDER — SEMAGLUTIDE (1 MG/DOSE) 4 MG/3ML ~~LOC~~ SOPN: 1.0000 mg | PEN_INJECTOR | SUBCUTANEOUS | 0 refills | Status: DC

## 2023-11-22 MED ORDER — ATOMOXETINE HCL 40 MG PO CAPS: 40.0000 mg | ORAL_CAPSULE | Freq: Every day | ORAL | 0 refills | Status: AC

## 2023-11-22 MED ORDER — LANTUS SOLOSTAR 100 UNIT/ML ~~LOC~~ SOPN: 70.0000 [IU] | PEN_INJECTOR | Freq: Every day | SUBCUTANEOUS | 3 refills | Status: DC

## 2023-11-22 NOTE — Progress Notes (Signed)
 11/22/2023 Name: [name] MRN: [id number] DOB: 09/07/1980  Chief Complaint  Patient presents with   Diabetes   Medication Management    [name] is [name] 43 y.o. year old male who presented for [name] telephone visit.   They were referred to the pharmacist by their PCP for assistance in managing diabetes.   Subjective:  Care Team: Primary Care Provider: [place] Gerald Schanz, MD ; Next Scheduled Visit: not scheduled Endocrinology initial visit 12/14/23  Medication Access/Adherence  Current Pharmacy:  [id number] [place], [place] - [address] AT [facility] [address] [place] [address] Phone: [phone] Fax: [phone]  [facility] [address], [address] [place] [address] Phone: [phone] Fax: [phone]  [id number] [place], [place] - [address] AT [facility] [address] [place] [address] Phone: [phone] Fax: [phone]  [facility] - [place], [place] - [address] [place] [address] Phone: [phone] Fax: [phone]   Patient reports affordability concerns with their medications: No  Patient reports access/transportation concerns to their pharmacy: No  Patient reports adherence concerns with their medications:  Yes    Diabetes:  Current medications: glipizide  10 mg twice daily, Ozempic  0.5 mg daily, Lantus  60 units daily  Medications tried in the past: metformin , Novolog   **Has not been on Trulicity  since end of last year. Was all the way up to 4.5 mg previously with no side effects noted.   Current glucose readings: Got pt set up with [facility]     Patient denies hypoglycemic s/sx including dizziness, shakiness, sweating.  Patient reports hyperglycemic symptoms including polyuria, polydipsia, polyphagia, nocturia, neuropathy,  blurred vision.   Current medication access support: Medicaid  Diet: Water/Pepsi Zero, Orange juice and grape fruit juice [name] few times week - notes he has cut back on juice/soda Mostly eating at home  Breakfast: sausage, egg, biscuit, bacon Lunch: not usually eating Dinner:   *Pt notes he started back at work 2 weeks ago. He drives [name] lot and eats on the go or tends to snack more while working. He has continued working on reducing soda/juice.   Objective:  Lab Results  Component Value Date   HGBA1C 12.4 ([name]) 07/11/2023    Lab Results  Component Value Date   CREATININE 0.92 09/09/2023   BUN 13 09/09/2023   NA 134 (L) 09/09/2023   K 4.2 09/09/2023   CL 95 (L) 09/09/2023   CO2 29 09/09/2023    Lab Results  Component Value Date   CHOL 145 07/11/2023   HDL 32.20 (L) 07/11/2023   LDLCALC 34 07/11/2023   LDLDIRECT 46.0 11/09/2022   TRIG 396.0 (H) 07/11/2023   CHOLHDL 5 07/11/2023    Medications Reviewed Today     Reviewed by [name], RPH (Pharmacist) on 11/22/23 at 0957  Med List Status: <None>   Medication Order Taking? Sig Documenting Provider Last Dose Status Informant  acetaminophen  (TYLENOL ) 500 MG tablet [id number]  Take 1,000 mg by mouth every 6 (six) hours as needed for mild pain. [provider]  Active Self  amLODipine  (NORVASC ) 10 MG tablet [id number]  TAKE 1 TABLET BY MOUTH EVERY DAY Sagardia, Gerald Schanz, MD  Active   atomoxetine  (STRATTERA ) 40 MG capsule [id number]  Take 1 capsule (40 mg total) by mouth daily.  Patient not taking: Reported on 11/22/2023  Gerald Arvella HERO, MD  Active Self  blood glucose meter kit and supplies [id number]  Dispense based on patient and insurance preference. Use up to four times daily as directed. (FOR ICD-10 E10.9, E11.9). Gerald Toribio GAILS, MD  Active            Med Note [name]   Tue Jun 10, 2020  9:14 AM) sometimes  Continuous Glucose Sensor (FREESTYLE LIBRE 3 SENSOR) [name] [id number]  Place 1 sensor  on the skin every 15 days. Use to check glucose continuously Gerald Gerald Schanz, MD  Active   cyclobenzaprine  (FLEXERIL ) 10 MG tablet [id number]  TAKE 1 TABLET BY MOUTH THREE TIMES [name] DAY AS NEEDED FOR MUSCLE SPASM Gerald Gerald Schanz, MD  Active   doxycycline  (VIBRA -TABS) 100 MG tablet [id number]  Take 1 tablet (100 mg total) by mouth 2 (two) times daily. [name], [name]  Active   doxycycline  (VIBRA -TABS) 100 MG tablet [id number]  Take 1 tablet (100 mg total) by mouth 2 (two) times daily for 10 days. [name], [name]  Active   gabapentin  (NEURONTIN ) 300 MG capsule [id number]  Take 1 capsule (300 mg total) by mouth 3 (three) times daily. Sagardia, Gerald Jose, MD  Active Self  glipiZIDE  (GLUCOTROL ) 10 MG tablet [id number] Yes TAKE 1 TABLET (10 MG TOTAL) BY MOUTH TWICE [name] DAY BEFORE [name] MEAL Gerald Gerald Schanz, MD  Active   hydrOXYzine  (ATARAX ) 10 MG tablet [id number]  TAKE 1 TABLET (10 MG TOTAL) BY MOUTH AT BEDTIME AS NEEDED FOR ANXIETY Gerald Arvella HERO, MD  Active            Med Note [name]   Mon Jul 11, 2023  3:50 PM) As needed   insulin  glargine (LANTUS  SOLOSTAR) 100 UNIT/ML Solostar Pen [id number] Yes Inject 60 Units into the skin daily. Gerald Gerald Schanz, MD  Active   Insulin  Pen Needle (PEN NEEDLES) 32G X 4 MM MISC [id number]  Use as directed with insulin  pen Gerald Gerald Schanz, MD  Active   ketoconazole  (NIZORAL ) 2 % cream [id number]  Apply 1 Application topically daily. [place] [name], [name]  Active   loratadine (CLARITIN) 10 MG tablet [id number]  Take 10 mg by mouth as needed for allergies. [provider]  Active   losartan -hydrochlorothiazide (HYZAAR) 100-12.5 MG tablet [id number]  TAKE 1 TABLET BY MOUTH EVERY DAY Sagardia, Gerald Jose, MD  Active   mirtazapine  (REMERON ) 15 MG tablet [id number]  Take 1 tablet (15 mg total) by mouth at bedtime.  Patient taking differently: Take 15 mg by mouth as needed.   Gerald Arvella HERO, MD  Active Self  mupirocin   ointment (BACTROBAN ) 2 % [id number]  Apply 1 Application topically daily. [name], [name]  Active   omeprazole  (PRILOSEC) 40 MG capsule [id number]  TAKE 1 CAPSULE (40 MG TOTAL) BY MOUTH 2 (TWO) TIMES DAILY BEFORE [name] MEAL. Avram Lupita BRAVO, MD  Active   OXcarbazepine  (TRILEPTAL ) 150 MG tablet [id number]  TAKE 1 TABLET BY MOUTH TWICE [name] DAY Camara, Amadou, MD  Active   rosuvastatin  (CRESTOR ) 20 MG tablet [id number]  TAKE 1 TABLET BY MOUTH EVERY DAY Sagardia, Gerald Schanz, MD  Active   Semaglutide ,0.25 or 0.5MG /DOS, (OZEMPIC , 0.25 OR 0.5 MG/DOSE,) 2 MG/3ML SOPN [id number] Yes Inject 0.5 mg into the skin once [name] week. Sagardia, Gerald Jose, MD  Active   sildenafil  (VIAGRA ) 100 MG tablet [id number]  TAKE 1/2 - 1 TABLET BY MOUTH DAILY AS NEEDED FOR ERECTILE DYSFUNCTION Gerald Erwin,  MD  Active   silver  sulfADIAZINE  (SILVADENE ) 1 % cream [id number]  Apply 1 Application topically daily. [name], [name]  Active   tadalafil  (CIALIS ) 20 MG tablet [id number]  Take 0.5-1 tablets (10-20 mg total) by mouth every other day as needed for erectile dysfunction. Sagardia, Gerald Jose, MD  Active   Testosterone  Enanthate (XYOSTED ) 100 MG/0.5ML EMMANUEL [id number]  Inject 0.5 mLs (100 mg total) into the skin once [name] week. Gerald Gerald Schanz, MD  Active             Assessment/Plan:   Diabetes: - Currently uncontrolled, A1c goal <7% - Average BG increased from previous f/u, possibly due to change in diet habits since he has returned to work - Reviewed long term cardiovascular and renal outcomes of uncontrolled blood sugar - Discussed dietary modifications: reduce juice consumption, increase protein/fiber, encouraged balanced meals - Increase ozempic  to 1 mg weekly  - Increase Lantus  to 70 units daily - Continue using Freestyle Libre to monitor glucose levels - recommend holding off on starting Jardiance  due to increased risk of DKA in A1c >10%  -Sending 1 month refill of Strattera  - pt has upcoming  psych appt in 2 weeks  Follow Up Plan: 7/24  Darrelyn Drum, PharmD, BCPS, CPP Clinical Pharmacist Practitioner [facility] [phone]

## 2023-11-22 NOTE — Patient Instructions (Signed)
 It was a pleasure speaking with you today!  Increase Ozempic  to 1 mg weekly and Lantus  to 70 units daily.  Feel free to call with any questions or concerns!  Darrelyn Drum, PharmD, BCPS, CPP Clinical Pharmacist Practitioner [facility] [phone]

## 2023-12-01 ENCOUNTER — Ambulatory Visit (HOSPITAL_COMMUNITY): Admitting: Physician Assistant

## 2023-12-01 VITALS — BP 126/90 | HR 104 | Temp 97.7°F | Ht 75.0 in | Wt 305.0 lb

## 2023-12-01 DIAGNOSIS — F333 Major depressive disorder, recurrent, severe with psychotic symptoms: Secondary | ICD-10-CM | POA: Diagnosis not present

## 2023-12-01 DIAGNOSIS — F4310 Post-traumatic stress disorder, unspecified: Secondary | ICD-10-CM

## 2023-12-01 DIAGNOSIS — F411 Generalized anxiety disorder: Secondary | ICD-10-CM

## 2023-12-01 MED ORDER — HYDROXYZINE HCL 25 MG PO TABS: 25.0000 mg | ORAL_TABLET | Freq: Every evening | ORAL | 1 refills | Status: DC | PRN

## 2023-12-01 MED ORDER — ARIPIPRAZOLE 5 MG PO TABS: 5.0000 mg | ORAL_TABLET | Freq: Every day | ORAL | 1 refills | Status: DC

## 2023-12-01 MED ORDER — VORTIOXETINE HBR 10 MG PO TABS: 10.0000 mg | ORAL_TABLET | Freq: Every day | ORAL | 1 refills | Status: DC

## 2023-12-01 NOTE — Progress Notes (Signed)
 Psychiatric Initial Adult Assessment   Patient Identification: [name] MRN:  [id number] Date of Evaluation:  12/01/2023 Referral Source: Not applicable Chief Complaint:   Chief Complaint  Patient presents with   Establish Care   Medication Management   Visit Diagnosis:    ICD-10-CM   1. PTSD (post-traumatic stress disorder)  F43.10     2. GAD (generalized anxiety disorder)  F41.1 hydrOXYzine  (ATARAX ) 25 MG tablet    vortioxetine  HBr (TRINTELLIX ) 10 MG TABS tablet    3. Severe episode of recurrent major depressive disorder, with psychotic features (HCC)  F33.3 ARIPiprazole  (ABILIFY ) 5 MG tablet    vortioxetine  HBr (TRINTELLIX ) 10 MG TABS tablet      History of Present Illness:   [name]. [name] is a 43 year old male with a past psychiatric history significant for anxiety, depression, PTSD, mood disorder, and insomnia who presents to [facility] to establish psychiatric care and for medication management.  Patient presents to the encounter stating that he has been diagnosed with several different psychiatric diagnoses and is requesting medication management.  Patient reports that he has been receiving help on and off with his mental health throughout the years.  He reports that when his mental health is well managed, he does not reach out for help but when he is not doing well, he will seek help from a psychiatric provider.  Patient reports that his anxiety and depression are his greatest concerns today.  Patient has been dealing with depression for the past 10 years.  Patient rates his depression an 8 out of 10 with 10 being most severe.  Patient endorses depressive episodes every day with symptoms lasting the whole day.  Patient endorses the following depressive symptoms: feelings of sadness, lack of motivation, decreased concentration, decreased energy, irritability, feelings of guilt/worthlessness, and hopelessness.  Patient denies any  worsening factors to his depression.  He denies any alleviating factors to his depression.  Patient reports that he has been on the following psychiatric medications in the past: Zoloft, Lexapro, Prozac , Celexa , Effexor , trazodone , and duloxetine.  In addition to his depression, patient endorses anxiety and rates his anxiety a 9 out of 10.  Patient reports that he experiences panic attacks a few times per week and denies any specific triggers.  Patient does report that he recently was evicted from his previous place of residence.  He reports that his sons are living with his family while he is currently living within a daycare center owned by his mother.  He reports that his relationship with his parents could be better; however, he reports that he would like to spend the least amount of time with his family as he possibly can.  Patient denies having any motivation to find a place of his own and believes that it is attributed to his depression and anxiety.  In addition to his depression and anxiety, patient reports that he has experienced visual hallucinations in the past.  Patient reports that he sees spots and shapes that he knows are not there.  He also reports that he sees flashes of light in his field of vision every day.  Patient reports that these symptoms have been going on for roughly 3 years.  Patient reports that he has been hospitalized in the past.  He reports that his ex-wife had him IVC'd in 2018 and he ended up spending a night at [facility] ED.  Patient denies a past history of suicide attempt. A PHQ-9 screen was  performed with the patient scoring a 21.  A GAD-7 screen was also performed with the patient scoring a 21.  Patient is alert and oriented x 4, calm, cooperative, and fully engaged in conversation during the encounter.  Patient endorses irritable mood.  Patient exhibits depressed mood with appropriate affect.  Patient denies suicidal or homicidal ideations.  He further denies  auditory or visual hallucinations and does not appear to be responding to internal/external stimuli.  Patient denies paranoia but states that he is always aware of his surroundings.  Patient denies delusional thoughts.  Patient endorses poor sleep and receives on average 4 hours of sleep per night.  Patient endorses fair appetite and eats on average 2 meals per day.  Patient endorses alcohol consumption sparingly.  Patient endorses the use of smokeless tobacco and uses smokeless tobacco every day.  Patient denies illicit drug use.  Associated Signs/Symptoms: Depression Symptoms:  depressed mood, anhedonia, insomnia, hypersomnia, psychomotor agitation, fatigue, feelings of worthlessness/guilt, difficulty concentrating, hopelessness, anxiety, panic attacks, loss of energy/fatigue, disturbed sleep, weight loss, weight gain, decreased labido, increased appetite, decreased appetite, (Hypo) Manic Symptoms:  Distractibility, Elevated Mood, Flight of Ideas, Licensed conveyancer, Hallucinations, Impulsivity, Irritable Mood, Labiality of Mood, Anxiety Symptoms:  Excessive Worry, Panic Symptoms, Obsessive Compulsive Symptoms:   Checking, Specific Phobias, Psychotic Symptoms:  Hallucinations: Visual Paranoia, PTSD Symptoms: Had a traumatic exposure:  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 Had a traumatic exposure in the last month:  N/A Re-experiencing:  Flashbacks Intrusive Thoughts Hypervigilance:  Yes Hyperarousal:  Difficulty Concentrating Emotional Numbness/Detachment Irritability/Anger Sleep Avoidance:  Foreshortened Future  Past Psychiatric History:  Patient endorses a past psychiatric history significant for anxiety, depression, PTSD, mood disorder, and insomnia.  Patient endorses a past history of hospitalization due to mental health. -  In 2018, he reports that his ex-wife had an IVC and he stayed overnight at [facility] ED  Patient denies a past history of suicide attempt.  Patient denies a past history of homicide attempt.  Previous Psychotropic Medications: Yes , patient has been on the following psychiatric medications: Strattera , mirtazapine , Zoloft, Lexapro, Prozac , Celexa , Effexor , trazodone , and duloxetine.  Substance Abuse History in the last 12 months:  No.  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    Pneumonia    PTSD (post-traumatic stress disorder)    Scoliosis    Sleep apnea     Past Surgical History:  Procedure Laterality Date   LUMBAR LAMINECTOMY/DECOMPRESSION MICRODISCECTOMY Left 05/05/2016   Procedure: CENTRAL DECOMPRESSION L4-L5 AND FORAMINOTOMY FOR L4 ROOT AND L5 ROOT ON THE LEFT;  Surgeon: Tanda Heading, MD;  Location: [facility] ORS;  Service: Orthopedics;  Laterality: Left;   METATARSAL HEAD EXCISION Left 09/21/2023   Procedure: EXCISION, METATARSAL BONE, HEAD and graft application;  Surgeon: Malvin Marsa FALCON, DPM;  Location: [facility] ORS;  Service: Orthopedics/Podiatry;  Laterality: Left;  3RD LEFT TOE  AND BIOPSY OF 2ND toe   NO PAST SURGERIES     ulcer excision and I&D of abscess by Podiatry   04/2023   [facility]    Family Psychiatric History:  Patient denies a family history of mental illness.  Family history of suicide attempt: Patient denies Family history of homicide attempt: Patient denies Family history of substance abuse: Patient reports that his biological father abused  alcohol.  Family History:  Family History  Problem Relation Age of Onset   Stroke Maternal Grandmother    Colon cancer Neg Hx    Esophageal cancer Neg Hx    Rectal cancer Neg Hx    Stomach cancer Neg Hx     Social History:   Social History   Socioeconomic History   Marital status: Divorced    Spouse name: Not on file   Number of children: Not on file   Years of education: Not on file   Highest education level: Associate degree: occupational, Scientist, product/process development, or vocational program  Occupational History   Not on file  Tobacco Use   Smoking status: Former    Types: Cigars   Smokeless tobacco: Current    Types: Snuff  Vaping Use   Vaping status: Never Used  Substance and Sexual Activity   Alcohol use: Not Currently    Comment: few times year   Drug use: No   Sexual activity: Yes    Partners: Female    Comment: multiple partners.  Other Topics Concern   Not on file  Social History Narrative   Lives with family   Social Drivers of Health   Financial Resource Strain: High Risk (10/19/2023)   Overall Financial Resource Strain (CARDIA)    Difficulty of Paying Living Expenses: Hard  Food Insecurity: No Food Insecurity (10/19/2023)   Hunger Vital Sign    Worried About Running Out of Food in the Last Year: Never true    Ran Out of Food in the Last Year: Never true  Transportation Needs: No Transportation Needs (07/07/2023)   PRAPARE - Administrator, Civil Service (Medical): No    Lack of Transportation (Non-Medical): No  Physical Activity: Inactive (10/19/2023)   Exercise Vital Sign    Days of Exercise per Week: 0 days    Minutes of Exercise per Session: 0 min  Stress: Stress Concern Present (10/19/2023)   [employer] of Occupational Health - Occupational Stress Questionnaire    Feeling of Stress : Very much  Social Connections: Moderately Integrated (07/07/2023)   Social Connection and Isolation Panel    Frequency of Communication with Friends and Family:  More than three times a week    Frequency of Social Gatherings with Friends and Family: Twice a week    Attends Religious Services: More than 4 times per year    Active Member of [employer] or Organizations: Yes    Attends Engineer, structural: More than 4 times per year    Marital Status: Divorced    Additional Social History:  Patient denies social support.  Patient endorses having children of the time.  Patient denies housing.  Patient is currently employed.  Patient denies a past history of military experience.  Patient reports that he spent 32 hours in jail once.  Highest education earned by the patient is high school.  Patient endorses access to weapons and states that they are in a secure location.  Allergies:   Allergies  Allergen Reactions   Metformin  And Related Other (See Comments)  SEVERE GI UPSET   Other Other (See Comments)    Powder in some gloves - localized itching but not allergic to latex, benadryl  usually helps with this reaction   Latex Itching   Zolpidem Other (See Comments)    Passed out. And memory loss    Metabolic Disorder Labs: Lab Results  Component Value Date   HGBA1C 12.4 (A) 07/11/2023   MPG 275 01/15/2019   MPG 240 04/28/2016   No results found for: PROLACTIN Lab Results  Component Value Date   CHOL 145 07/11/2023   TRIG 396.0 (H) 07/11/2023   HDL 32.20 (L) 07/11/2023   CHOLHDL 5 07/11/2023   VLDL 79.2 (H) 07/11/2023   LDLCALC 34 07/11/2023   LDLCALC 100 (H) 04/23/2021   Lab Results  Component Value Date   TSH 2.359 07/11/2014    Therapeutic Level Labs: No results found for: LITHIUM No results found for: CBMZ No results found for: VALPROATE  Current Medications: Current Outpatient Medications  Medication Sig Dispense Refill   ARIPiprazole  (ABILIFY ) 5 MG tablet Take 1 tablet (5 mg total) by mouth daily. 30 tablet 1   vortioxetine  HBr (TRINTELLIX ) 10 MG TABS tablet Take 1 tablet (10 mg total) by mouth daily. 30 tablet  1   acetaminophen  (TYLENOL ) 500 MG tablet Take 1,000 mg by mouth every 6 (six) hours as needed for mild pain.     amLODipine  (NORVASC ) 10 MG tablet TAKE 1 TABLET BY MOUTH EVERY DAY 90 tablet 3   atomoxetine  (STRATTERA ) 40 MG capsule Take 1 capsule (40 mg total) by mouth daily. Needs appt with behavioral health for refill. 30 capsule 0   blood glucose meter kit and supplies Dispense based on patient and insurance preference. Use up to four times daily as directed. (FOR ICD-10 E10.9, E11.9). 1 each 0   Continuous Glucose Sensor (FREESTYLE LIBRE 3 SENSOR) MISC Place 1 sensor on the skin every 15 days. Use to check glucose continuously 2 each 5   cyclobenzaprine  (FLEXERIL ) 10 MG tablet TAKE 1 TABLET BY MOUTH THREE TIMES A DAY AS NEEDED FOR MUSCLE SPASM 90 tablet 1   doxycycline  (VIBRA -TABS) 100 MG tablet Take 1 tablet (100 mg total) by mouth 2 (two) times daily. 20 tablet 0   doxycycline  (VIBRA -TABS) 100 MG tablet Take 1 tablet (100 mg total) by mouth 2 (two) times daily for 10 days. 20 tablet 0   gabapentin  (NEURONTIN ) 300 MG capsule Take 1 capsule (300 mg total) by mouth 3 (three) times daily. 270 capsule 0   glipiZIDE  (GLUCOTROL ) 10 MG tablet TAKE 1 TABLET (10 MG TOTAL) BY MOUTH TWICE A DAY BEFORE A MEAL 180 tablet 3   hydrOXYzine  (ATARAX ) 25 MG tablet Take 1 tablet (25 mg total) by mouth at bedtime as needed for anxiety. 30 tablet 1   insulin  glargine (LANTUS  SOLOSTAR) 100 UNIT/ML Solostar Pen Inject 70 Units into the skin daily. 15 mL 3   Insulin  Pen Needle (PEN NEEDLES) 32G X 4 MM MISC Use as directed with insulin  pen 100 each 3   ketoconazole  (NIZORAL ) 2 % cream Apply 1 Application topically daily. 60 g 2   loratadine (CLARITIN) 10 MG tablet Take 10 mg by mouth as needed for allergies.     losartan -hydrochlorothiazide (HYZAAR) 100-12.5 MG tablet TAKE 1 TABLET BY MOUTH EVERY DAY 90 tablet 3   mupirocin  ointment (BACTROBAN ) 2 % Apply 1 Application topically daily. 22 g 0   omeprazole  (PRILOSEC) 40  MG capsule TAKE 1 CAPSULE (40 MG TOTAL) BY MOUTH  2 (TWO) TIMES DAILY BEFORE A MEAL. 180 capsule 0   OXcarbazepine  (TRILEPTAL ) 150 MG tablet TAKE 1 TABLET BY MOUTH TWICE A DAY 180 tablet 1   rosuvastatin  (CRESTOR ) 20 MG tablet TAKE 1 TABLET BY MOUTH EVERY DAY 90 tablet 3   Semaglutide , 1 MG/DOSE, 4 MG/3ML SOPN Inject 1 mg as directed once a week. 3 mL 0   sildenafil  (VIAGRA ) 100 MG tablet TAKE 1/2 - 1 TABLET BY MOUTH DAILY AS NEEDED FOR ERECTILE DYSFUNCTION 5 tablet 11   silver  sulfADIAZINE  (SILVADENE ) 1 % cream Apply 1 Application topically daily. 50 g 0   tadalafil  (CIALIS ) 20 MG tablet Take 0.5-1 tablets (10-20 mg total) by mouth every other day as needed for erectile dysfunction. 10 tablet 11   Testosterone  Enanthate (XYOSTED ) 100 MG/0.5ML SOAJ Inject 0.5 mLs (100 mg total) into the skin once a week. 1.96 mL 5   No current facility-administered medications for this visit.    Musculoskeletal: Strength & Muscle Tone: within normal limits Gait & Station: normal Patient leans: N/A  Psychiatric Specialty Exam: Review of Systems  Psychiatric/Behavioral:  Positive for dysphoric mood, hallucinations and sleep disturbance. Negative for decreased concentration, self-injury and suicidal ideas. The patient is nervous/anxious. The patient is not hyperactive.     Blood pressure (!) 126/90, pulse (!) 104, temperature 97.7 F (36.5 C), temperature source Oral, height 6' 3 (1.905 m), weight (!) 305 lb (138.3 kg), SpO2 99%.Body mass index is 38.12 kg/m.  General Appearance: Well Groomed  Eye Contact:  Good  Speech:  Clear and Coherent and Normal Rate  Volume:  Normal  Mood:  Anxious and Depressed  Affect:  Congruent  Thought Process:  Coherent, Goal Directed, and Descriptions of Associations: Intact  Orientation:  Full (Time, Place, and Person)  Thought Content:  WDL and Hallucinations: Visual  Suicidal Thoughts:  No  Homicidal Thoughts:  No  Memory:  Immediate;   Good Recent;   Good Remote;    Fair  Judgement:  Good  Insight:  Good  Psychomotor Activity:  Normal  Concentration:  Concentration: Good and Attention Span: Good  Recall:  Good  Fund of Knowledge:Good  Language: Good  Akathisia:  No  Handed:  Right  AIMS (if indicated):  not done  Assets:  Communication Skills Desire for Improvement Financial Resources/Insurance Resilience Transportation Vocational/Educational  ADL's:  Intact  Cognition: WNL  Sleep:  Poor   Screenings: AUDIT    Loss adjuster, chartered Office Visit from 07/11/2023 in [facility]  Alcohol Use Disorder Identification Test Final Score (AUDIT) 3    GAD-7    Flowsheet Row Office Visit from 12/01/2023 in [facility] Counselor from 10/19/2023 in [facility] Office Visit from 09/01/2023 in [facility] Office Visit from 11/09/2022 in [facility] Office Visit from 02/07/2020 in Primary Care at [facility]  Total GAD-7 Score 21 20 19 21  0   PHQ2-9    Flowsheet Row Office Visit from 12/01/2023 in [facility] Counselor from 10/19/2023 in [facility] Office Visit from 09[facility]01/2023 in [facility] at [facility] Office Visit from 07/11/2023 in [facility] at [facility] Visit from 11/09/2022 in [facility] at [facility]  PHQ-2 Total Score 5 4 2 3 6   PHQ-9 Total Score 21 20 16 14 22    Flowsheet Row Office Visit from 12/01/2023  in [facility] Counselor from 10/19/2023 in [facility] Admission (Discharged) from 09/21/2023 in [facility]  C-SSRS RISK CATEGORY No Risk No Risk No Risk    Assessment and Plan:   Diamond Martucci. Garrette is a 43 year old male with a past psychiatric history significant for anxiety,  depression, PTSD, mood disorder, and insomnia who presents to [facility] to establish psychiatric care and for medication management.  Patient presents to the encounter requesting medication management for his depression and anxiety.  Patient reports that he has been dealing with depression for roughly 10-year and has been on several different psychiatric medications including the following: Zoloft, hydroxyzine , duloxetine, trazodone , Lexapro, Prozac , Celexa , and Effexor .  In addition to his depression, patient endorses anxiety and panic attacks.  A PHQ-9 screen was performed with the patient scoring a 21.  A GAD-7 screen was also performed with the patient scoring a 21.  Patient notes that he has also been experiencing poor sleep and receives on average 4 hours of sleep per night.  In addition to his depression and anxiety, patient reports that he has experienced visual hallucinations characterized by shapes and flashes of light.  He reports that these symptoms have been going on for 3 years.  Provider recommended patient be placed on hydroxyzine  25 mg at bedtime as needed for the management of his sleep.  Provider also recommended patient be placed on Trintellix  10 mg daily for the management of his depressive symptoms and anxiety.  Lastly, provider recommended patient be placed on Abilify  5 mg daily for the management of psychotic features (visual hallucinations).  Patient was agreeable to recommendations.  Patient's medications to be e-prescribed to pharmacy of choice.  Provider discussed side effects associated with the use of patient's medications prior to the conclusion of the encounter.  A [place] Suicide Severity Rating Scale was performed with the patient being considered no risk.  Patient denies suicidal ideations and is able to contract for safety following the conclusion of the encounter.  Collaboration of Care: Medication Management AEB provider  managing patient's psychiatric medications, Primary Care Provider AEB patient being seen by a primary care provider (internal medicine), Psychiatrist AEB patient being followed by mental health provider at this facility, Other provider involved in patient's care AEB patient being seen by podiatry, neurology, and endocrinology, and Referral or follow-up with counselor/therapist AEB patient to be set up with a licensed clinical social worker following the conclusion of the encounter  Patient/Guardian was advised Release of Information must be obtained prior to any record release in order to collaborate their care with an outside provider. Patient/Guardian was advised if they have not already done so to contact the registration department to sign all necessary forms in order for us  to release information regarding their care.   Consent: Patient/Guardian gives verbal consent for treatment and assignment of benefits for services provided during this visit. Patient/Guardian expressed understanding and agreed to proceed.   1. PTSD (post-traumatic stress disorder) (Primary)  2. GAD (generalized anxiety disorder)  - hydrOXYzine  (ATARAX ) 25 MG tablet; Take 1 tablet (25 mg total) by mouth at bedtime as needed for anxiety.  Dispense: 30 tablet; Refill: 1 - vortioxetine  HBr (TRINTELLIX ) 10 MG TABS tablet; Take 1 tablet (10 mg total) by mouth daily.  Dispense: 30 tablet; Refill: 1  3. Severe episode of recurrent major depressive disorder, with psychotic features (HCC)  - ARIPiprazole  (ABILIFY ) 5 MG tablet; Take 1 tablet (  5 mg total) by mouth daily.  Dispense: 30 tablet; Refill: 1 - vortioxetine  HBr (TRINTELLIX ) 10 MG TABS tablet; Take 1 tablet (10 mg total) by mouth daily.  Dispense: 30 tablet; Refill: 1  Patient to follow up in 6 weeks with Sahil Kapoor, MD [place] spent a total of 60+ minutes with the patient/reviewing patient's chart  Reginia FORBES Bolster, PA 7/24/202510:35 AM

## 2023-12-02 ENCOUNTER — Encounter (HOSPITAL_COMMUNITY): Payer: Self-pay | Admitting: Physician Assistant

## 2023-12-05 ENCOUNTER — Other Ambulatory Visit (INDEPENDENT_AMBULATORY_CARE_PROVIDER_SITE_OTHER): Admitting: Pharmacist

## 2023-12-05 DIAGNOSIS — E1159 Type 2 diabetes mellitus with other circulatory complications: Secondary | ICD-10-CM

## 2023-12-05 DIAGNOSIS — I152 Hypertension secondary to endocrine disorders: Secondary | ICD-10-CM

## 2023-12-05 MED ORDER — LANTUS SOLOSTAR 100 UNIT/ML ~~LOC~~ SOPN: 40.0000 [IU] | PEN_INJECTOR | Freq: Two times a day (BID) | SUBCUTANEOUS | 0 refills | Status: DC

## 2023-12-05 NOTE — Patient Instructions (Signed)
 It was a pleasure speaking with you today!  Increase Lantus  to 40 units twice daily Continue Ozempic  1 mg weekly and glipizide  10 mg twice daily with food.  Continue working on diet changes to help lower blood sugars.  Feel free to call with any questions or concerns!  Darrelyn Drum, PharmD, BCPS, CPP Clinical Pharmacist Practitioner [facility] [phone]

## 2023-12-05 NOTE — Progress Notes (Signed)
 12/05/2023 Name: SHAKIL DIRK MRN: [id number] DOB: February 06, 1981  Chief Complaint  Patient presents with   Diabetes   Medication Management    [name] is a 43 y.o. year old male who presented for a telephone visit.   They were referred to the pharmacist by their PCP for assistance in managing diabetes.   Subjective:  Care Team: Primary Care Provider: Purcell Emil Schanz, MD ; Next Scheduled Visit: not scheduled Endocrinology initial visit 12/14/23  Medication Access/Adherence  Current Pharmacy:  [id number] [place], [place] - [address] AT [facility] [address] [place] [address] Phone: [phone] Fax: [phone]  [facility] - [facility] [address] [place] [address] Phone: [phone] Fax: [phone]  [id number] - [place], [place] - [address] AT [facility] [address] [place] [address] Phone: [phone] Fax: [phone]  [facility] - [place], [place] - [address] [place] [address] Phone: [phone] Fax: [phone]   Patient reports affordability concerns with their medications: No  Patient reports access/transportation concerns to their pharmacy: No  Patient reports adherence concerns with their medications:  Yes    Diabetes:  Current medications: glipizide  10 mg twice daily, Ozempic  1 mg daily (started 1 week ago), Lantus  70 units daily  Medications tried in the past: metformin , Novolog   **Has not been on Trulicity  since end of last year. Was all the way up to 4.5 mg previously with no side effects noted.   Current glucose readings: Got pt set up with [facility]     Patient denies hypoglycemic s/sx including dizziness, shakiness, sweating.  Patient reports hyperglycemic symptoms including polyuria, polydipsia, polyphagia,  nocturia, neuropathy, blurred vision.   Current medication access support: Medicaid  Diet: Water/Pepsi Zero, Orange juice and grape fruit juice a few times week - notes he has cut back on juice/soda Mostly eating at home  Breakfast: sausage, egg, biscuit, bacon Lunch: not usually eating Dinner:   *Pt notes he started back at work 4 weeks ago. He drives a lot and eats on the go or tends to snack more while working. He has continued working on reducing soda/juice.   Objective:  Lab Results  Component Value Date   HGBA1C 12.4 (A) 07/11/2023    Lab Results  Component Value Date   CREATININE 0.92 09/09/2023   BUN 13 09/09/2023   NA 134 (L) 09/09/2023   K 4.2 09/09/2023   CL 95 (L) 09/09/2023   CO2 29 09/09/2023    Lab Results  Component Value Date   CHOL 145 07/11/2023   HDL 32.20 (L) 07/11/2023   LDLCALC 34 07/11/2023   LDLDIRECT 46.0 11/09/2022   TRIG 396.0 (H) 07/11/2023   CHOLHDL 5 07/11/2023    Medications Reviewed Today     Reviewed by Merceda Lela SAUNDERS, RPH (Pharmacist) on 12/05/23 at [phone]  Med List Status: <None>   Medication Order Taking? Sig Documenting Provider Last Dose Status Informant  acetaminophen  (TYLENOL ) 500 MG tablet [id number]  Take 1,000 mg by mouth every 6 (six) hours as needed for mild pain. [provider]  Active Self  amLODipine  (NORVASC ) 10 MG tablet [id number]  TAKE 1 TABLET BY MOUTH EVERY DAY Sagardia, Emil Schanz, MD  Active   ARIPiprazole  (ABILIFY ) 5 MG tablet [id number]  Take 1 tablet (5 mg total) by mouth daily. Nwoko, Uchenna E, PA  Active   atomoxetine  (STRATTERA ) 40 MG capsule [id number]  Take 1 capsule (40 mg total) by mouth daily. Needs appt with behavioral health for refill. Purcell Emil Schanz, MD  Active   blood glucose meter kit and supplies [id number]  Dispense based on patient and insurance preference. Use up to four times daily as directed. (FOR ICD-10 E10.9, E11.9). Sebastian Toribio GAILS, MD  Active            Med Note  JULES, CYNTHIA A   Tue Jun 10, 2020  9:14 AM) sometimes  Continuous Glucose Sensor (FREESTYLE LIBRE 3 SENSOR) [place] [id number]  Place 1 sensor on the skin every 15 days. Use to check glucose continuously Purcell Emil Schanz, MD  Active   cyclobenzaprine  (FLEXERIL ) 10 MG tablet [id number]  TAKE 1 TABLET BY MOUTH THREE TIMES A DAY AS NEEDED FOR MUSCLE SPASM Purcell Emil Schanz, MD  Active   doxycycline  (VIBRA -TABS) 100 MG tablet [id number]  Take 1 tablet (100 mg total) by mouth 2 (two) times daily. Gershon Donnice SAUNDERS, DPM  Active   doxycycline  (VIBRA -TABS) 100 MG tablet [id number]  Take 1 tablet (100 mg total) by mouth 2 (two) times daily for 10 days. Standiford, Marsa FALCON, DPM  Active   gabapentin  (NEURONTIN ) 300 MG capsule [id number]  Take 1 capsule (300 mg total) by mouth 3 (three) times daily. Sagardia, Miguel Jose, MD  Active Self  glipiZIDE  (GLUCOTROL ) 10 MG tablet [id number] Yes TAKE 1 TABLET (10 MG TOTAL) BY MOUTH TWICE A DAY BEFORE A MEAL Sagardia, Emil Schanz, MD  Active   hydrOXYzine  (ATARAX ) 25 MG tablet [id number]  Take 1 tablet (25 mg total) by mouth at bedtime as needed for anxiety. Nwoko, Uchenna E, PA  Active   insulin  glargine (LANTUS  SOLOSTAR) 100 UNIT/ML Solostar Pen [id number] Yes Inject 70 Units into the skin daily. Purcell Emil Schanz, MD  Active   Insulin  Pen Needle (PEN NEEDLES) 32G X 4 MM MISC [id number]  Use as directed with insulin  pen Sagardia, Miguel Jose, MD  Active   ketoconazole  (NIZORAL ) 2 % cream [id number]  Apply 1 Application topically daily. Lamount Ethan CROME, DPM  Active   loratadine (CLARITIN) 10 MG tablet [id number]  Take 10 mg by mouth as needed for allergies. [provider]  Active   losartan -hydrochlorothiazide (HYZAAR) 100-12.5 MG tablet [id number]  TAKE 1 TABLET BY MOUTH EVERY DAY Sagardia, Emil Schanz, MD  Active   mupirocin  ointment (BACTROBAN ) 2 % [id number]  Apply 1 Application topically daily. Standiford, Marsa FALCON, DPM  Active   omeprazole  (PRILOSEC)  40 MG capsule [id number]  TAKE 1 CAPSULE (40 MG TOTAL) BY MOUTH 2 (TWO) TIMES DAILY BEFORE A MEAL. Avram Lupita BRAVO, MD  Active   OXcarbazepine  (TRILEPTAL ) 150 MG tablet [id number]  TAKE 1 TABLET BY MOUTH TWICE A DAY Camara, Amadou, MD  Active   rosuvastatin  (CRESTOR ) 20 MG tablet [id number]  TAKE 1 TABLET BY MOUTH EVERY DAY Sagardia, Miguel Jose, MD  Active   Semaglutide , 1 MG/DOSE, 4 MG/3ML SOPN [id number] Yes Inject 1 mg as directed once a week. Sagardia, Miguel Jose, MD  Active   sildenafil  (VIAGRA ) 100 MG tablet [id number]  TAKE 1/2 - 1 TABLET BY MOUTH DAILY AS NEEDED FOR ERECTILE DYSFUNCTION Purcell Emil Schanz, MD  Active   silver  sulfADIAZINE  (SILVADENE ) 1 % cream [id number]  Apply 1 Application topically daily. Gershon Donnice SAUNDERS, DPM  Active   tadalafil  (CIALIS ) 20 MG tablet [id number]  Take 0.5-1 tablets (10-20 mg total) by mouth every other day  as needed for erectile dysfunction. Sagardia, Miguel Jose, MD  Active   Testosterone  Enanthate (XYOSTED ) 100 MG/0.5ML EMMANUEL [id number]  Inject 0.5 mLs (100 mg total) into the skin once a week. Sagardia, Miguel Jose, MD  Active   vortioxetine  HBr (TRINTELLIX ) 10 MG TABS tablet [id number]  Take 1 tablet (10 mg total) by mouth daily. Collene Reginia BRAVO, PA  Active             Assessment/Plan:   Diabetes: - Currently uncontrolled, A1c goal <7% - Average BG decreased from 286 to 259 in the last 2 weeks - remains elevated, in target range only 12% - Reviewed long term cardiovascular and renal outcomes of uncontrolled blood sugar - Discussed dietary modifications: reduce juice consumption, increase protein/fiber, encouraged balanced meals - Continue ozempic  to 1 mg weekly  - Increase Lantus  to 80 units daily but split dose 40 units twice daily - Continue using Freestyle Libre to monitor glucose levels - recommend holding off on starting Jardiance  due to increased risk of DKA in A1c >10% -Scheduled patient for 3 month PCP f/u due to pt concern for  testosterone  level/low energy   Follow Up Plan: 8/11  Darrelyn Drum, PharmD, BCPS, CPP Clinical Pharmacist Practitioner [facility] [phone]

## 2023-12-08 ENCOUNTER — Ambulatory Visit: Admitting: Emergency Medicine

## 2023-12-08 ENCOUNTER — Ambulatory Visit: Payer: Self-pay | Admitting: Emergency Medicine

## 2023-12-08 ENCOUNTER — Encounter: Payer: Self-pay | Admitting: Emergency Medicine

## 2023-12-08 VITALS — BP 122/84 | HR 114 | Temp 97.6°F | Ht 75.0 in | Wt 297.4 lb

## 2023-12-08 DIAGNOSIS — I152 Hypertension secondary to endocrine disorders: Secondary | ICD-10-CM

## 2023-12-08 DIAGNOSIS — E785 Hyperlipidemia, unspecified: Secondary | ICD-10-CM | POA: Diagnosis not present

## 2023-12-08 DIAGNOSIS — E1169 Type 2 diabetes mellitus with other specified complication: Secondary | ICD-10-CM | POA: Diagnosis not present

## 2023-12-08 DIAGNOSIS — Z125 Encounter for screening for malignant neoplasm of prostate: Secondary | ICD-10-CM

## 2023-12-08 DIAGNOSIS — E11621 Type 2 diabetes mellitus with foot ulcer: Secondary | ICD-10-CM

## 2023-12-08 DIAGNOSIS — E1165 Type 2 diabetes mellitus with hyperglycemia: Secondary | ICD-10-CM | POA: Diagnosis not present

## 2023-12-08 DIAGNOSIS — Z7984 Long term (current) use of oral hypoglycemic drugs: Secondary | ICD-10-CM | POA: Diagnosis not present

## 2023-12-08 DIAGNOSIS — E291 Testicular hypofunction: Secondary | ICD-10-CM

## 2023-12-08 DIAGNOSIS — L97429 Non-pressure chronic ulcer of left heel and midfoot with unspecified severity: Secondary | ICD-10-CM | POA: Diagnosis not present

## 2023-12-08 DIAGNOSIS — E1159 Type 2 diabetes mellitus with other circulatory complications: Secondary | ICD-10-CM | POA: Diagnosis not present

## 2023-12-08 LAB — LIPID PANEL
Cholesterol: 110 mg/dL (ref 0–200)
HDL: 29.5 mg/dL — ABNORMAL LOW (ref >39.00)
LDL Cholesterol: 49 mg/dL (ref 0–99)
NonHDL: 80.28
Total CHOL/HDL Ratio: 4
Triglycerides: 157 mg/dL — ABNORMAL HIGH (ref 0.0–149.0)
VLDL: 31.4 mg/dL (ref 0.0–40.0)

## 2023-12-08 LAB — COMPREHENSIVE METABOLIC PANEL WITH GFR
ALT: 22 U/L (ref 0–53)
AST: 19 U/L (ref 0–37)
Albumin: 4.1 g/dL (ref 3.5–5.2)
Alkaline Phosphatase: 49 U/L (ref 39–117)
BUN: 13 mg/dL (ref 6–23)
CO2: 33 meq/L — ABNORMAL HIGH (ref 19–32)
Calcium: 9.3 mg/dL (ref 8.4–10.5)
Chloride: 98 meq/L (ref 96–112)
Creatinine, Ser: 1.02 mg/dL (ref 0.40–1.50)
GFR: 91.49 mL/min (ref >60.00)
Glucose, Bld: 209 mg/dL — ABNORMAL HIGH (ref 70–99)
Potassium: 3.2 meq/L — ABNORMAL LOW (ref 3.5–5.1)
Sodium: 138 meq/L (ref 135–145)
Total Bilirubin: 0.4 mg/dL (ref 0.2–1.2)
Total Protein: 7.5 g/dL (ref 6.0–8.3)

## 2023-12-08 LAB — TESTOSTERONE: Testosterone: 263.84 ng/dL — ABNORMAL LOW (ref 300.00–890.00)

## 2023-12-08 LAB — POCT GLYCOSYLATED HEMOGLOBIN (HGB A1C): HbA1c, POC (controlled diabetic range): 10.4 % — AB (ref 0.0–7.0)

## 2023-12-08 MED ORDER — EMPAGLIFLOZIN 10 MG PO TABS: 10.0000 mg | ORAL_TABLET | Freq: Every day | ORAL | 3 refills | Status: DC

## 2023-12-08 NOTE — Progress Notes (Signed)
 [name] 43 y.o.   Chief Complaint  Patient presents with   Follow-up    Patient states testosterone  level. Asking about A1c being checked. Patient wants to show you his foot.     HISTORY OF PRESENT ILLNESS: This is a 43 y.o. male here for 43-month follow-up of diabetes Overall doing better Clinical pharmacist reaching out to him on a regular basis Presently on glipizide  10 mg twice a day, Lantus  insulin  40 units twice a day and weekly semaglutide  1 mg No other complaints or medical concerns today. Wt Readings from Last 3 Encounters:  12/08/23 297 lb 6.4 oz (134.9 kg)  09/21/23 (!) 305 lb (138.3 kg)  09/16/23 (!) 305 lb (138.3 kg)     HPI   Prior to Admission medications   Medication Sig Start Date End Date Taking? Authorizing Provider  acetaminophen  (TYLENOL ) 500 MG tablet Take 1,000 mg by mouth every 6 (six) hours as needed for mild pain.   Yes [provider]  amLODipine  (NORVASC ) 10 MG tablet TAKE 1 TABLET BY MOUTH EVERY DAY 10/13/23  Yes Alivya Wegman, Emil Schanz, MD  ARIPiprazole  (ABILIFY ) 5 MG tablet Take 1 tablet (5 mg total) by mouth daily. 12/01/23  Yes Nwoko, Uchenna E, PA  atomoxetine  (STRATTERA ) 40 MG capsule Take 1 capsule (40 mg total) by mouth daily. Needs appt with behavioral health for refill. 11/22/23  Yes Toshiko Kemler, Emil Schanz, MD  blood glucose meter kit and supplies Dispense based on patient and insurance preference. Use up to four times daily as directed. (FOR ICD-10 E10.9, E11.9). 01/16/19  Yes Sebastian Toribio GAILS, MD  Continuous Glucose Sensor (FREESTYLE LIBRE 3 SENSOR) MISC Place 1 sensor on the skin every 15 days. Use to check glucose continuously 09/27/23  Yes Chaniah Cisse, Emil Schanz, MD  cyclobenzaprine  (FLEXERIL ) 10 MG tablet TAKE 1 TABLET BY MOUTH THREE TIMES A DAY AS NEEDED FOR MUSCLE SPASM 10/27/23  Yes Alvenia Treese, Emil Schanz, MD  gabapentin  (NEURONTIN ) 300 MG capsule Take 1 capsule (300 mg total) by mouth 3 (three) times daily. 09/06/23 12/08/23 Yes  Kaidan Spengler, Emil Schanz, MD  glipiZIDE  (GLUCOTROL ) 10 MG tablet TAKE 1 TABLET (10 MG TOTAL) BY MOUTH TWICE A DAY BEFORE A MEAL 10/13/23  Yes Nadene Witherspoon, Emil Schanz, MD  hydrOXYzine  (ATARAX ) 25 MG tablet Take 1 tablet (25 mg total) by mouth at bedtime as needed for anxiety. 12/01/23  Yes Nwoko, Uchenna E, PA  insulin  glargine (LANTUS  SOLOSTAR) 100 UNIT/ML Solostar Pen Inject 40 Units into the skin 2 (two) times daily. 12/05/23  Yes Sanjay Broadfoot, Emil Schanz, MD  Insulin  Pen Needle (PEN NEEDLES) 32G X 4 MM MISC Use as directed with insulin  pen 01/27/23  Yes Jemimah Cressy, Emil Schanz, MD  ketoconazole  (NIZORAL ) 2 % cream Apply 1 Application topically daily. 09/16/23  Yes Semon, Jake L, DPM  loratadine (CLARITIN) 10 MG tablet Take 10 mg by mouth as needed for allergies.   Yes [provider]  losartan -hydrochlorothiazide (HYZAAR) 100-12.5 MG tablet TAKE 1 TABLET BY MOUTH EVERY DAY 10/13/23  Yes Sienna Stonehocker Jose, MD  mupirocin  ointment (BACTROBAN ) 2 % Apply 1 Application topically daily. 09/29/23  Yes Standiford, Marsa FALCON, DPM  omeprazole  (PRILOSEC) 40 MG capsule TAKE 1 CAPSULE (40 MG TOTAL) BY MOUTH 2 (TWO) TIMES DAILY BEFORE A MEAL. 11/10/22  Yes Avram Lupita BRAVO, MD  OXcarbazepine  (TRILEPTAL ) 150 MG tablet TAKE 1 TABLET BY MOUTH TWICE A DAY 10/13/23  Yes Camara, Amadou, MD  rosuvastatin  (CRESTOR ) 20 MG tablet TAKE 1 TABLET BY MOUTH EVERY DAY  10/13/23  Yes Margarite Vessel, Emil Schanz, MD  Semaglutide , 1 MG/DOSE, 4 MG/3ML SOPN Inject 1 mg as directed once a week. 11/22/23  Yes Savahna Casados, Emil Schanz, MD  sildenafil  (VIAGRA ) 100 MG tablet TAKE 1/2 - 1 TABLET BY MOUTH DAILY AS NEEDED FOR ERECTILE DYSFUNCTION 09/13/23  Yes Tunis Gentle, Emil Schanz, MD  Testosterone  Enanthate (XYOSTED ) 100 MG/0.5ML SOAJ Inject 0.5 mLs (100 mg total) into the skin once a week. 09/01/23  Yes Tauren Delbuono, Emil Schanz, MD  vortioxetine  HBr (TRINTELLIX ) 10 MG TABS tablet Take 1 tablet (10 mg total) by mouth daily. 12/01/23  Yes Nwoko, Uchenna E, PA   doxycycline  (VIBRA -TABS) 100 MG tablet Take 1 tablet (100 mg total) by mouth 2 (two) times daily. Patient not taking: Reported on 12/08/2023 09/09/23   Gershon Donnice SAUNDERS, DPM  doxycycline  (VIBRA -TABS) 100 MG tablet Take 1 tablet (100 mg total) by mouth 2 (two) times daily for 10 days. Patient not taking: Reported on 12/08/2023 10/12/23   Standiford, Marsa FALCON, DPM  silver  sulfADIAZINE  (SILVADENE ) 1 % cream Apply 1 Application topically daily. Patient not taking: Reported on 12/08/2023 07/28/23   Gershon Donnice SAUNDERS, DPM  tadalafil  (CIALIS ) 20 MG tablet Take 0.5-1 tablets (10-20 mg total) by mouth every other day as needed for erectile dysfunction. Patient not taking: Reported on 12/08/2023 03/08/22   Purcell Emil Schanz, MD    Allergies  Allergen Reactions   Amoxicillin  Anaphylaxis and Other (See Comments)    Has patient had a PCN reaction causing immediate rash, facial/tongue/throat swelling, SOB or lightheadedness with hypotension: yes Has patient had a PCN reaction causing severe rash involving mucus membranes or skin necrosis: no Has patient had a PCN reaction that required hospitalization yes Has patient had a PCN reaction occurring within the last 10 years: yes If all of the above answers are NO, then may proceed with Cephalosporin use.   Penicillins Anaphylaxis and Other (See Comments)    Patient was told to list this as an allergy because he is allergic to Amoxicillin  Has patient had a PCN reaction causing immediate rash, facial/tongue/throat swelling, SOB or lightheadedness with hypotension: yes for Amoxicillin  Has patient had a PCN reaction causing severe rash involving mucus membranes or skin necrosis: no Has patient had a PCN reaction that required hospitalization yes for Amoxicillin  Has patient had a PCN reaction occurring within the last 10 years: yes for Amoxicillin  If all o   Metformin  And Related Other (See Comments)    SEVERE GI UPSET   Other Other (See Comments)     Powder in some gloves - localized itching but not allergic to latex, benadryl  usually helps with this reaction   Latex Itching   Zolpidem Other (See Comments)    Passed out. 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) 02/07/2020   Morbid obesity, unspecified obesity type (HCC) 01/10/2020   Hypertension associated with diabetes (HCC) 04/17/2018   Dyslipidemia 04/17/2018   PTSD (post-traumatic stress disorder) 08/11/2016   Spinal stenosis, lumbar region with neurogenic claudication 05/05/2016   Dysthymia 09/02/2015   Chronic low back  pain 09/02/2015   Dyslipidemia associated with type 2 diabetes mellitus (HCC) 03/11/2008   UNSPECIFIED ANEMIA 03/11/2008    Past Medical History:  Diagnosis Date   Allergies    Allergy    Anemia    Anxiety    Arthritis    DDD (degenerative disc disease), lumbar    cervical   Depression    Diabetes mellitus    Epilepsy (HCC)    GERD (gastroesophageal reflux disease)    Hyperlipidemia    Hypertension    Lumbar disc herniation    Neuromuscular disorder (HCC)    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    Pneumonia    PTSD (post-traumatic stress disorder)    Scoliosis    Sleep apnea     Past Surgical History:  Procedure Laterality Date   LUMBAR LAMINECTOMY/DECOMPRESSION MICRODISCECTOMY Left 05/05/2016   Procedure: CENTRAL DECOMPRESSION L4-L5 AND FORAMINOTOMY FOR L4 ROOT AND L5 ROOT ON THE LEFT;  Surgeon: Tanda Heading, MD;  Location: [facility];  Service: Orthopedics;  Laterality: Left;    METATARSAL HEAD EXCISION Left 09/21/2023   Procedure: EXCISION, METATARSAL BONE, HEAD and graft application;  Surgeon: Malvin Marsa FALCON, DPM;  Location: [facility];  Service: Orthopedics/Podiatry;  Laterality: Left;  3RD LEFT TOE AND BIOPSY OF 2ND toe   NO PAST SURGERIES     ulcer excision and I&D of abscess by Podiatry   04/2023   [facility]    Social History   Socioeconomic History   Marital status: Divorced    Spouse name: Not on file   Number of children: Not on file   Years of education: Not on file   Highest education level: Associate degree: occupational, Scientist, product/process development, or vocational program  Occupational History   Not on file  Tobacco Use   Smoking status: Former    Types: Cigars   Smokeless tobacco: Current    Types: Snuff  Vaping Use   Vaping status: Never Used  Substance and Sexual Activity   Alcohol use: Not Currently    Comment: few times year   Drug use: No   Sexual activity: Yes    Partners: Female    Comment: multiple partners.  Other Topics Concern   Not on file  Social History Narrative   Lives with family   Social Drivers of Health   Financial Resource Strain: High Risk (10/19/2023)   Overall Financial Resource Strain (CARDIA)    Difficulty of Paying Living Expenses: Hard  Food Insecurity: No Food Insecurity (10/19/2023)   Hunger Vital Sign    Worried About Running Out of Food in the Last Year: Never true    Ran Out of Food in the Last Year: Never true  Transportation Needs: No Transportation Needs (07/07/2023)   PRAPARE - Administrator, Civil Service (Medical): No    Lack of Transportation (Non-Medical): No  Physical Activity: Inactive (10/19/2023)   Exercise Vital Sign    Days of Exercise per Week: 0 days    Minutes of Exercise per Session: 0 min  Stress: Stress Concern Present (10/19/2023)   [employer] of Occupational Health - Occupational Stress Questionnaire    Feeling of Stress : Very much  Social Connections: Moderately  Integrated (07/07/2023)   Social Connection and Isolation Panel    Frequency of Communication with Friends and Family: More than three times a week    Frequency of Social Gatherings with Friends and Family: Twice a week    Attends Religious Services: More than 4 times per year  Active Member of Clubs or Organizations: Yes    Attends Banker Meetings: More than 4 times per year    Marital Status: Divorced  Intimate Partner Violence: Not At Risk (04/18/2023)   Humiliation, Afraid, Rape, and Kick questionnaire    Fear of Current or Ex-Partner: No    Emotionally Abused: No    Physically Abused: No    Sexually Abused: No    Family History  Problem Relation Age of Onset   Stroke Maternal Grandmother    Colon cancer Neg Hx    Esophageal cancer Neg Hx    Rectal cancer Neg Hx    Stomach cancer Neg Hx      Review of Systems  Constitutional: Negative.  Negative for chills and fever.  HENT: Negative.  Negative for congestion and sore throat.   Respiratory: Negative.  Negative for cough and shortness of breath.   Cardiovascular: Negative.  Negative for chest pain and palpitations.  Gastrointestinal:  Negative for abdominal pain, nausea and vomiting.  Genitourinary: Negative.  Negative for dysuria and hematuria.  Skin: Negative.  Negative for rash.  Neurological:  Negative for dizziness and headaches.  All other systems reviewed and are negative.   Vitals:   12/08/23 0834  BP: 122/84  Pulse: (!) 114  Temp: 97.6 F (36.4 C)  SpO2: 95%    Physical Exam Vitals reviewed.  Constitutional:      Appearance: Normal appearance. He is obese.  HENT:     Head: Normocephalic.     Mouth/Throat:     Mouth: Mucous membranes are moist.     Pharynx: Oropharynx is clear.  Eyes:     Extraocular Movements: Extraocular movements intact.     Pupils: Pupils are equal, round, and reactive to light.  Cardiovascular:     Rate and Rhythm: Normal rate and regular rhythm.     Pulses:  Normal pulses.     Heart sounds: Normal heart sounds.  Pulmonary:     Effort: Pulmonary effort is normal.     Breath sounds: Normal breath sounds.  Musculoskeletal:     Cervical back: No tenderness.  Lymphadenopathy:     Cervical: No cervical adenopathy.  Skin:    General: Skin is warm and dry.     Capillary Refill: Capillary refill takes less than 2 seconds.  Neurological:     General: No focal deficit present.     Mental Status: He is alert and oriented to person, place, and time.  Psychiatric:        Mood and Affect: Mood normal.        Behavior: Behavior normal.     Results for orders placed or performed in visit on 12/08/23 (from the past 24 hours)  POCT HgB A1C     Status: Abnormal   Collection Time: 12/08/23  8:50 AM  Result Value Ref Range   Hemoglobin A1C     HbA1c POC (<> result, manual entry)     HbA1c, POC (prediabetic range)     HbA1c, POC (controlled diabetic range) 10.4 (A) 0.0 - 7.0 %    ASSESSMENT & PLAN: A total of 45 minutes was spent with the patient and counseling/coordination of care regarding preparing for this visit, review of most recent office visit notes, review of multiple chronic medical conditions and their management, cardiovascular risks associated with uncontrolled diabetes, review of all medications and changes made, review of most recent bloodwork results including interpretation of today's hemoglobin A1c, review of health maintenance items, education  on nutrition, prognosis, documentation, and need for follow up.   Problem List Items Addressed This Visit       Cardiovascular and Mediastinum   Hypertension associated with diabetes (HCC) - Primary   Well-controlled hypertension Continue amlodipine  10 mg daily and Hyzaar 100-12.5 mg daily Uncontrolled diabetes with hemoglobin A1c at 10.4 Continue glipizide  10 mg twice a day and Lantus  insulin  40 units twice a day Continue weekly Ozempic  at 1 mg Restart Jardiance  10 mg daily Cardiovascular  risks associated with uncontrolled diabetes discussed Diet and nutrition discussed Needs endocrinology evaluation.  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)   Much improved      Relevant Medications   empagliflozin  (JARDIANCE ) 10 MG TABS tablet   Hypogonadism in male   Recommend testosterone  level today Testosterone  supplementation seems to be helping      Relevant Orders   Testosterone      Other   Morbid obesity, unspecified obesity type (HCC)   Diet and nutrition discussed Advised to decrease amount of daily carbohydrate intake and daily calories and increase amount of plant-based protein in his diet Benefits of exercise discussed      Relevant Medications   empagliflozin  (JARDIANCE ) 10 MG TABS tablet   Other Visit Diagnoses       Type 2 diabetes mellitus with hyperglycemia, without long-term current use of insulin  (HCC)       Relevant Medications   empagliflozin  (JARDIANCE ) 10 MG TABS tablet   Other Relevant Orders   POCT HgB A1C (Completed)      Patient Instructions  Diabetes Mellitus and Nutrition, Adult When you have diabetes, or diabetes mellitus, it is very important to have healthy eating habits because your blood sugar (glucose) levels are greatly affected by what you eat and drink. Eating healthy foods in the right amounts, at about the same times every day, can help you: Manage your  blood glucose. Lower your risk of heart disease. Improve your blood pressure. Reach or maintain a healthy weight. What can affect my meal plan? Every person with diabetes is different, and each person has different needs for a meal plan. Your health care provider may recommend that you work with a dietitian to make a meal plan that is best for you. Your meal plan may vary depending on factors such as: The calories you need. The medicines you take. Your weight. Your blood glucose, blood pressure, and cholesterol levels. Your activity level. Other health conditions you have, such as heart or kidney disease. How do carbohydrates affect me? Carbohydrates, also called carbs, affect your blood glucose level more than any other type of food. Eating carbs raises the amount of glucose in your blood. It is important to know how many carbs you can safely have in each meal. This is different for every person. Your dietitian can help you calculate how many carbs you should have  at each meal and for each snack. How does alcohol affect me? Alcohol can cause a decrease in blood glucose (hypoglycemia), especially if you use insulin  or take certain diabetes medicines by mouth. Hypoglycemia can be a life-threatening condition. Symptoms of hypoglycemia, such as sleepiness, dizziness, and confusion, are similar to symptoms of having too much alcohol. Do not drink alcohol if: Your health care provider tells you not to drink. You are pregnant, may be pregnant, or are planning to become pregnant. If you drink alcohol: Limit how much you have to: 0-1 drink a day for women. 0-2 drinks a day for men. Know how much alcohol is in your drink. In the [place] one drink equals one 12 oz bottle of beer (355 mL), one 5 oz glass of wine (148 mL), or one 1 oz glass of hard liquor (44 mL). Keep yourself hydrated with water, diet soda, or unsweetened iced tea. Keep in mind that regular soda, juice, and other mixers may contain a  lot of sugar and must be counted as carbs. What are tips for following this plan?  Reading food labels Start by checking the serving size on the Nutrition Facts label of packaged foods and drinks. The number of calories and the amount of carbs, fats, and other nutrients listed on the label are based on one serving of the item. Many items contain more than one serving per package. Check the total grams (g) of carbs in one serving. Check the number of grams of saturated fats and trans fats in one serving. Choose foods that have a low amount or none of these fats. Check the number of milligrams (mg) of salt (sodium) in one serving. Most people should limit total sodium intake to less than 2,300 mg per day. Always check the nutrition information of foods labeled as low-fat or nonfat. These foods may be higher in added sugar or refined carbs and should be avoided. Talk to your dietitian to identify your daily goals for nutrients listed on the label. Shopping Avoid buying canned, pre-made, or processed foods. These foods tend to be high in fat, sodium, and added sugar. Shop around the outside edge of the grocery store. This is where you will most often find fresh fruits and vegetables, bulk grains, fresh meats, and fresh dairy products. Cooking Use low-heat cooking methods, such as baking, instead of high-heat cooking methods, such as deep frying. Cook using healthy oils, such as olive, canola, or sunflower oil. Avoid cooking with butter, cream, or high-fat meats. Meal planning Eat meals and snacks regularly, preferably at the same times every day. Avoid going long periods of time without eating. Eat foods that are high in fiber, such as fresh fruits, vegetables, beans, and whole grains. Eat 4-6 oz (112-168 g) of lean protein each day, such as lean meat, chicken, fish, eggs, or tofu. One ounce (oz) (28 g) of lean protein is equal to: 1 oz (28 g) of meat, chicken, or fish. 1 egg.  cup (62 g) of  tofu. Eat some foods each day that contain healthy fats, such as avocado, nuts, seeds, and fish. What foods should I eat? Fruits Berries. Apples. Oranges. Peaches. Apricots. Plums. Grapes. Mangoes. Papayas. Pomegranates. Kiwi. Cherries. Vegetables Leafy greens, including lettuce, spinach, kale, chard, collard greens, mustard greens, and cabbage. Beets. Cauliflower. Broccoli. Carrots. Green beans. Tomatoes. Peppers. Onions. Cucumbers. Brussels sprouts. Grains Whole grains, such as whole-wheat or whole-grain bread, crackers, tortillas, cereal, and pasta. Unsweetened oatmeal. Quinoa. Brown or wild rice. Meats and other proteins  Seafood. Poultry without skin. Lean cuts of poultry and beef. Tofu. Nuts. Seeds. Dairy Low-fat or fat-free dairy products such as milk, yogurt, and cheese. The items listed above may not be a complete list of foods and beverages you can eat and drink. Contact a dietitian for more information. What foods should I avoid? Fruits Fruits canned with syrup. Vegetables Canned vegetables. Frozen vegetables with butter or cream sauce. Grains Refined white flour and flour products such as bread, pasta, snack foods, and cereals. Avoid all processed foods. Meats and other proteins Fatty cuts of meat. Poultry with skin. Breaded or fried meats. Processed meat. Avoid saturated fats. Dairy Full-fat yogurt, cheese, or milk. Beverages Sweetened drinks, such as soda or iced tea. The items listed above may not be a complete list of foods and beverages you should avoid. Contact a dietitian for more information. Questions to ask a health care provider Do I need to meet with a certified diabetes care and education specialist? Do I need to meet with a dietitian? What number can I call if I have questions? When are the best times to check my blood glucose? Where to find more information: American Diabetes Association: diabetes.org Academy of Nutrition and Dietetics:  eatright.[employer] of Diabetes and Digestive and Kidney Diseases: StageSync.si Association of Diabetes Care & Education Specialists: diabeteseducator.org Summary It is important to have healthy eating habits because your blood sugar (glucose) levels are greatly affected by what you eat and drink. It is important to use alcohol carefully. A healthy meal plan will help you manage your blood glucose and lower your risk of heart disease. Your health care provider may recommend that you work with a dietitian to make a meal plan that is best for you. This information is not intended to replace advice given to you by your health care provider. Make sure you discuss any questions you have with your health care provider. Document Revised: 11/27/2019 Document Reviewed: 11/28/2019 Elsevier Patient Education  2024 Elsevier Inc.     Emil Schaumann, MD [facility]

## 2023-12-08 NOTE — Assessment & Plan Note (Signed)
 Recommend testosterone  level today Testosterone  supplementation seems to be helping

## 2023-12-08 NOTE — Assessment & Plan Note (Signed)
 Much improved

## 2023-12-08 NOTE — Assessment & Plan Note (Signed)
 Well-controlled hypertension Continue amlodipine  10 mg daily and Hyzaar 100-12.5 mg daily Uncontrolled diabetes with hemoglobin A1c at 10.4 Continue glipizide  10 mg twice a day and Lantus  insulin  40 units twice a day Continue weekly Ozempic  at 1 mg Restart Jardiance  10 mg daily Cardiovascular risks associated with uncontrolled diabetes discussed Diet and nutrition discussed Needs endocrinology evaluation.  Referral placed today We will follow-up after that

## 2023-12-08 NOTE — Assessment & Plan Note (Signed)
Uncontrolled diabetes as described above Continue rosuvastatin 20 mg daily Lipid profile done today

## 2023-12-08 NOTE — Assessment & Plan Note (Signed)
Diet and nutrition discussed.  Advised to decrease amount of daily carbohydrate intake and daily calories and increase amount of plant-based protein in his diet. Benefits of exercise discussed. 

## 2023-12-08 NOTE — Patient Instructions (Signed)

## 2023-12-13 ENCOUNTER — Telehealth (HOSPITAL_COMMUNITY): Payer: Self-pay

## 2023-12-13 ENCOUNTER — Ambulatory Visit (INDEPENDENT_AMBULATORY_CARE_PROVIDER_SITE_OTHER): Admitting: Licensed Clinical Social Worker

## 2023-12-13 DIAGNOSIS — F4310 Post-traumatic stress disorder, unspecified: Secondary | ICD-10-CM | POA: Diagnosis not present

## 2023-12-13 DIAGNOSIS — F411 Generalized anxiety disorder: Secondary | ICD-10-CM

## 2023-12-13 NOTE — Progress Notes (Signed)
 THERAPIST PROGRESS NOTE  Session Time: 60  Participation Level: Active  Behavioral Response: CasualAlertAnxious and Depressed  Type of Therapy: Individual Therapy  Treatment Goals addressed:  Active     BH CCP Acute or Chronic Trauma Reaction     LTG: Recall traumatic events without becoming overwhelmed with negative emotions (Progressing)     Start:  10/19/23    Expected End:  05/11/24         STG: [name] will complete at least 80% of assigned homework  (Progressing)     Start:  10/19/23    Expected End:  05/11/24         STG: [name] will cooperate with and complete psychological testing to help evaluate trauma symptoms (Progressing)     Start:  10/19/23    Expected End:  05/11/24         STG: [name] will describe the signs and symptoms of PTSD that are experienced and how they interfere with daily living     Start:  10/19/23    Expected End:  05/11/24         STG: [name] will identify internal and external stimuli that trigger PTSD symptoms     Start:  10/19/23    Expected End:  05/11/24         STG: [name] will acknowledge that healing from PTSD is a gradual process     Start:  10/19/23    Expected End:  05/11/24         STG: [name] will identify coping strategies to deal with trauma memories and the associated emotional reaction     Start:  10/19/23    Expected End:  05/11/24         STG: [name] will cooperate with a medication evaluation by accurately reporting symptoms, if present     Start:  10/19/23    Expected End:  05/11/24         Work with [name] to track symptoms, triggers, and/or skill use through a mood chart, diary card, or journal     Start:  10/19/23         Encourage [name] to participate in recovery peer support activities      Start:  10/19/23         [name] to take psychotropic medication as prescribed     Start:  10/19/23         [name] to communicate effects of prescribed medications      Start:  10/19/23         Provide [name] with education on trauma-oriented therapy     Start:  10/19/23         Provide and outline the treatment process to [name], explaining that it will include a gradual processing of the details and feelings associated with the trauma and developing new, more appropriate coping strategies     Start:  10/19/23         Cooperate with trauma-focused psychotherapy techniques to reduce emotional reaction to the traumatic event      Start:  10/19/23         Refer [name] to a psychiatrist for a consultation regarding medication management of symptoms      Start:  10/19/23         Assess [name]'s substance use pattern; refer or treat the victim for chemical dependence if indicated      Start:  10/19/23           OP Depression  LTG: Reduce frequency, intensity, and duration of depression symptoms so that daily functioning is improved (Progressing)     Start:  10/19/23    Expected End:  05/11/24         LTG: Increase coping skills to manage depression and improve ability to perform daily activities (Progressing)     Start:  10/19/23    Expected End:  05/11/24         STG: [name] will attend at least 80% of scheduled family sessions  (Not Applicable)     Start:  10/19/23    Expected End:  05/11/24    Resolved:  12/13/23      Work with [name] to track symptoms, triggers, and/or skill use through a mood chart, diary card, or journal     Start:  10/19/23         Encourage [name] to participate in recovery peer support activities weekly  (Completed)     Start:  10/19/23    End:  12/13/23      Provide [name] educational information and reading material on dissociation, its causes, and symptoms (Completed)     Start:  10/19/23    End:  12/13/23      Work with [name] to identify the major components of a recent episode of depression: physical symptoms, major thoughts and images, and major behaviors they experienced  (Completed)     Start:  10/19/23    End:  12/13/23      Therapist will educate patient on cognitive distortions and the rationale for treatment of depression (Completed)     Start:  10/19/23    End:  12/13/23         ProgressTowards Goals: Progressing  Interventions: CBT, Motivational Interviewing, and Supportive   Suicidal/Homicidal: Nowithout intent/plan  Therapist Response:    [name] was alert and oriented x 5.  He was pleasant, cooperative, maintained good eye contact.  Patient engaged well in therapy session was dressed casually.  [name] came in today with depressed and anxious mood\affect.  Patient comes in today stating that he has started medications for hydroxyzine  and Abilify .  He reports that he was also prescribed Trixellex but needs a prior authorization.  LCSW utilized intervention for solution-focused providing patient feedback about the process of obtaining a prior authorization and information needed that would be obtained by the nursing  staff.  LCSW provided patient with nurses station number for further information.  Primary stressors for patient are financial.  This is as evidenced by patient impulse spending or gambling.  He reports that this is nothing new but that it has become a problem in his life.  Intervention for patient was writing down 3 consequences for his behavior for excessive spending or gambling.  Consequences talked about in session were not having money for her needs, feeling like a failure\feeling defeated, and anxiety for tension and worry.  Other stressors for patient include family conflict.  Patient has been struggling with his relationship with his parents.  Patient reports that they want to help him but can be overbearing.  LCSW and patient spoke about setting healthy boundaries and family life.  Interventions: LCSW provided patient worksheet for gratitude exercises to increase self worth and decrease hopelessness feelings.  LCSW safety  plan with patient due to history of suicidal ideation feelings with no plan or intent.  Patient does report that due to EMS calls that he has been on where family is in chaos after a suicide he does not feel like  he would ever go through with suicidal ideations.  Patient was also provided resources for behavioral health urgent care and suicide prevention hotline.  LCSW educated patient on the stages of change for precontemplation, contemplation, action, and maintenance.  Plan: Return again in 4 weeks.  Diagnosis: PTSD (post-traumatic stress disorder)  GAD (generalized anxiety disorder)  Collaboration of Care: Other Continued individual        therapy   Patient/Guardian was advised Release of Information must be obtained prior to any record release in order to collaborate their care with an outside provider. Patient/Guardian was advised if they have not already done so to contact the registration department to sign all necessary forms in order for us  to release information regarding their care.   Consent: Patient/Guardian gives verbal consent for treatment and assignment of benefits for services provided during this visit. Patient/Guardian expressed understanding and agreed to proceed.   Gerald GORMAN Patee, LCSW 12/13/2023

## 2023-12-14 ENCOUNTER — Encounter: Payer: Self-pay | Admitting: Endocrinology

## 2023-12-14 ENCOUNTER — Ambulatory Visit (INDEPENDENT_AMBULATORY_CARE_PROVIDER_SITE_OTHER): Admitting: Endocrinology

## 2023-12-14 VITALS — BP 128/88 | HR 101 | Resp 20 | Ht 75.0 in | Wt 306.8 lb

## 2023-12-14 DIAGNOSIS — E1165 Type 2 diabetes mellitus with hyperglycemia: Secondary | ICD-10-CM

## 2023-12-14 DIAGNOSIS — Z794 Long term (current) use of insulin: Secondary | ICD-10-CM

## 2023-12-14 MED ORDER — INSULIN PEN NEEDLE 32G X 4 MM MISC
3 refills | Status: AC
Start: 2023-12-14 — End: ?

## 2023-12-14 MED ORDER — SEMAGLUTIDE (2 MG/DOSE) 8 MG/3ML ~~LOC~~ SOPN: 2.0000 mg | PEN_INJECTOR | SUBCUTANEOUS | 3 refills | Status: DC

## 2023-12-14 MED ORDER — LANTUS SOLOSTAR 100 UNIT/ML ~~LOC~~ SOPN: 50.0000 [IU] | PEN_INJECTOR | Freq: Every day | SUBCUTANEOUS | 4 refills | Status: DC

## 2023-12-14 MED ORDER — NOVOLOG FLEXPEN 100 UNIT/ML ~~LOC~~ SOPN: 15.0000 [IU] | PEN_INJECTOR | Freq: Three times a day (TID) | SUBCUTANEOUS | 5 refills | Status: DC

## 2023-12-14 NOTE — Progress Notes (Signed)
 Outpatient Endocrinology Note Gerald Nazire Fruth, [name]   Patient's Name: [name]    DOB: December 26, 1980    MRN: [id number]                                                    REASON OF VISIT: New consult for type 2 diabetes mellitus  REFERRING PROVIDER: Purcell Emil Schanz, [name]  PCP: Purcell Emil Schanz, [name]  HISTORY OF PRESENT ILLNESS:   [name] is a 43 y.o. old male with past medical history listed below, is here for new consult for type 2 diabetes mellitus.   Pertinent Diabetes History: Patient was diagnosed with type 2 diabetes mellitus in 2008.  He was seen in this clinic in 2017 timeframe, after that he lost follow-up with endocrinology, was following with primary care provider, lately has been seeing pharmacist as well.  Patient referred to endocrinology for evaluation and management of uncontrolled type 2 diabetes mellitus and initial visit on August 6 of 2025.  He has uncontrolled type 2 diabetes mellitus with hemoglobin A1c in the range of 12%, improved to 10.4% in December 08, 2023.  Diabetes regimen has been gradually adjusted currently on basal insulin  along with Ozempic  has been on Ozempic  1 mg weekly for about 3 weeks.  He has also been taking glipizide .  Diabetes control and compliance had been challenging in the past due to mental health problems /depression as well.  History of DKA or diabetes related hospitalizations: none  Previous diabetes education: no ?  No personal history of pancreatitis and / or family history of medullary thyroid carcinoma or MEN 2B syndrome.   Chronic Diabetes Complications : Retinopathy: no reportedly. Last ophthalmology exam was done on annually, following with ophthalmology regularly.  Nephropathy: no, on ACE/ARB /losartan  Peripheral neuropathy: ? no Coronary artery disease: no Stroke: no  Relevant comorbidities and cardiovascular risk factors: Obesity: yes Body mass index is 38.35 kg/m.  Hypertension: Yes  Hyperlipidemia :  Yes, on statin   Current / Home Diabetic regimen includes:  Lantus  40 units two times a day.  Ozempic  1 mg weekly.  Glipizide  10 mg two times a day.   Prior diabetic medications: Metformin  in the past he stopped due to GI intolerance.  He had taken Invokana  in the past.  He had taken Victoza  in the past.  He had taken Trulicity  was switched to Ozempic .  Glycemic data:    CONTINUOUS GLUCOSE MONITORING SYSTEM (CGMS) INTERPRETATION: At today's visit, we reviewed CGM downloads. The full report is scanned in the media. Reviewing the CGM trends, blood glucose are as follows:  FreeStyle Libre 3+ CGM-  Sensor Download (Sensor download was reviewed and summarized below.) Dates: July 24 to December 14, 2023, 14 days Sensor Average: 227  Glucose Management Indicator: 8.7%  % data captured: 96%   Interpretation: - Frequent hyperglycemia postprandially related to high carb meal with blood sugar in the range of 350-400s, at different times of the day especially overnight around suppertime and sometime late morning.  Mostly blood sugar overnight and in between the meals are improving with in the range of 180-200s.  In the last 3 days blood sugar has been mostly in the acceptable range with no significant hyperglycemia, however had blood sugar at times especially around late afternoon in the low normal range of 90s.  No hypoglycemia.  Hypoglycemia: Patient has no hypoglycemic episodes. Patient has hypoglycemia awareness  Factors modifying glucose control: 1.  Diabetic diet assessment: 2-3 meals a day.  He sometimes eats bedtime snack.  Cut down on soda significantly.  2.  Staying active or exercising: Active at work.  3.  Medication compliance: compliant all of the time.  # ? Hypogonadism : Currently on testosterone  therapy.  No detail workup of hypogonadism available to review of the chart.  Managed by primary care provider.  Interval history  Patient presented to establish endocrinology  care for uncontrolled type 2 diabetes mellitus.  He has gradual improvement on diabetes control.  Diabetes regimen is reviewed and noted above.  CGM glucose data is reviewed and noted above.  REVIEW OF SYSTEMS As per history of present illness.   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    Pneumonia    PTSD (post-traumatic stress disorder)    Scoliosis    Sleep apnea     PAST SURGICAL HISTORY: Past Surgical History:  Procedure Laterality Date   LUMBAR LAMINECTOMY/DECOMPRESSION MICRODISCECTOMY Left 05/05/2016   Procedure: CENTRAL DECOMPRESSION L4-L5 AND FORAMINOTOMY FOR L4 ROOT AND L5 ROOT ON THE LEFT;  Surgeon: Tanda Heading, [name];  Location: [facility];  Service: Orthopedics;  Laterality: Left;   METATARSAL HEAD EXCISION Left 09/21/2023   Procedure: EXCISION, METATARSAL BONE, HEAD and graft application;  Surgeon: Malvin Marsa FALCON, DPM;  Location: [facility] OR[facility];  Service: Orthopedics/Podiatry;  Laterality: Left;  3RD LEFT TOE AND BIOPSY OF 2ND toe   NO PAST SURGERIES     ulcer excision and I&D of abscess by Podiatry   04/2023   [facility]    ALLERGIES: Allergies  Allergen Reactions   Amoxicillin  Anaphylaxis and Other (See Comments)    Has patient had a PCN reaction causing immediate rash, facial/tongue/throat swelling, SOB or lightheadedness with hypotension: yes Has patient had a PCN reaction causing severe rash involving mucus membranes or skin necrosis: no Has patient had a PCN reaction that required hospitalization yes Has patient had a PCN reaction occurring within the last 10 years: yes If all of the above answers are  NO, then may proceed with Cephalosporin use.   Penicillins Anaphylaxis and Other (See Comments)    Patient was told to list this as an allergy because he is allergic to Amoxicillin  Has patient had a PCN reaction causing immediate rash, facial/tongue/throat swelling, SOB or lightheadedness with hypotension: yes for Amoxicillin  Has patient had a PCN reaction causing severe rash involving mucus membranes or skin necrosis: no Has patient had a PCN reaction that required hospitalization yes for Amoxicillin  Has patient had a PCN reaction occurring within the last 10 years: yes for Amoxicillin  If all o   Metformin  And Related Other (See Comments)    SEVERE GI UPSET   Other Other (See Comments)    Powder in some gloves - localized itching but not allergic to latex, benadryl  usually helps with this reaction   Latex Itching   Zolpidem Other (See Comments)    Passed out.  And memory loss    FAMILY HISTORY:  Family History  Problem Relation Age of Onset   Stroke Maternal Grandmother    Colon cancer Neg Hx    Esophageal cancer Neg Hx    Rectal cancer Neg Hx    Stomach cancer Neg Hx     SOCIAL HISTORY: Social History   Socioeconomic History   Marital status: Divorced    Spouse name: Not on file   Number of children: Not on file   Years of education: Not on file   Highest education level: Associate degree: occupational, Scientist, product/process development, or vocational program  Occupational History   Not on file  Tobacco Use   Smoking status: Former    Types: Cigars   Smokeless tobacco: Current    Types: Snuff   Tobacco comments:    Patient states will consider quitting in future but not interested right now.   Vaping Use   Vaping status: Never Used  Substance and Sexual Activity   Alcohol use: Not Currently    Comment: few times year   Drug use: No   Sexual activity: Yes    Partners: Female    Comment: multiple partners.  Other Topics Concern   Not on file  Social History Narrative   Lives with  family   Social Drivers of Health   Financial Resource Strain: High Risk (10/19/2023)   Overall Financial Resource Strain (CARDIA)    Difficulty of Paying Living Expenses: Hard  Food Insecurity: No Food Insecurity (10/19/2023)   Hunger Vital Sign    Worried About Running Out of Food in the Last Year: Never true    Ran Out of Food in the Last Year: Never true  Transportation Needs: No Transportation Needs (07/07/2023)   PRAPARE - Administrator, Civil Service (Medical): No    Lack of Transportation (Non-Medical): No  Physical Activity: Inactive (10/19/2023)   Exercise Vital Sign    Days of Exercise per Week: 0 days    Minutes of Exercise per Session: 0 min  Stress: Stress Concern Present (10/19/2023)   [employer] of Occupational Health - Occupational Stress Questionnaire    Feeling of Stress : Very much  Social Connections: Moderately Integrated (07/07/2023)   Social Connection and Isolation Panel    Frequency of Communication with Friends and Family: More than three times a week    Frequency of Social Gatherings with Friends and Family: Twice a week    Attends Religious Services: More than 4 times per year    Active Member of [employer] or Organizations: Yes    Attends Engineer, structural: More than 4 times per year    Marital Status: Divorced    MEDICATIONS:  Current Outpatient Medications  Medication Sig Dispense Refill   acetaminophen  (TYLENOL ) 500 MG tablet Take 1,000 mg by mouth every 6 (six) hours as needed for mild pain.     amLODipine  (NORVASC ) 10 MG tablet TAKE 1 TABLET BY MOUTH EVERY DAY 90 tablet 3   ARIPiprazole  (ABILIFY ) 5 MG tablet Take 1 tablet (5 mg total) by mouth daily. 30 tablet 1   atomoxetine  (STRATTERA ) 40 MG capsule Take 1 capsule (40 mg total) by mouth daily. Needs appt with behavioral health for refill. 30 capsule 0   blood glucose meter kit and supplies Dispense based on patient and insurance preference. Use up to four times daily as  directed. (FOR ICD-10 E10.9, E11.9). 1 each 0   Continuous Glucose Sensor (FREESTYLE LIBRE 3 SENSOR) MISC Place  1 sensor on the skin every 15 days. Use to check glucose continuously 2 each 5   cyclobenzaprine  (FLEXERIL ) 10 MG tablet TAKE 1 TABLET BY MOUTH THREE TIMES A DAY AS NEEDED FOR MUSCLE SPASM 90 tablet 1   gabapentin  (NEURONTIN ) 300 MG capsule Take 1 capsule (300 mg total) by mouth 3 (three) times daily. 270 capsule 0   glipiZIDE  (GLUCOTROL ) 10 MG tablet TAKE 1 TABLET (10 MG TOTAL) BY MOUTH TWICE A DAY BEFORE A MEAL 180 tablet 3   hydrOXYzine  (ATARAX ) 25 MG tablet Take 1 tablet (25 mg total) by mouth at bedtime as needed for anxiety. 30 tablet 1   insulin  aspart (NOVOLOG  FLEXPEN) 100 UNIT/ML FlexPen Inject 15 Units into the skin 3 (three) times daily with meals. 30 mL 5   Insulin  Pen Needle 32G X 4 MM MISC Use 4x a day 300 each 3   ketoconazole  (NIZORAL ) 2 % cream Apply 1 Application topically daily. (Patient taking differently: Apply 1 Application topically daily as needed.) 60 g 2   loratadine (CLARITIN) 10 MG tablet Take 10 mg by mouth as needed for allergies.     losartan -hydrochlorothiazide (HYZAAR) 100-12.5 MG tablet TAKE 1 TABLET BY MOUTH EVERY DAY 90 tablet 3   mupirocin  ointment (BACTROBAN ) 2 % Apply 1 Application topically daily. (Patient taking differently: Apply 1 Application topically as needed.) 22 g 0   omeprazole  (PRILOSEC) 40 MG capsule TAKE 1 CAPSULE (40 MG TOTAL) BY MOUTH 2 (TWO) TIMES DAILY BEFORE A MEAL. 180 capsule 0   OXcarbazepine  (TRILEPTAL ) 150 MG tablet TAKE 1 TABLET BY MOUTH TWICE A DAY 180 tablet 1   rosuvastatin  (CRESTOR ) 20 MG tablet TAKE 1 TABLET BY MOUTH EVERY DAY 90 tablet 3   Semaglutide , 2 MG/DOSE, 8 MG/3ML SOPN Inject 2 mg as directed once a week. 9 mL 3   sildenafil  (VIAGRA ) 100 MG tablet TAKE 1/2 - 1 TABLET BY MOUTH DAILY AS NEEDED FOR ERECTILE DYSFUNCTION (Patient taking differently: as needed.) 5 tablet 11   Testosterone  Enanthate (XYOSTED ) 100  MG/0.5ML SOAJ Inject 0.5 mLs (100 mg total) into the skin once a week. 1.96 mL 5   insulin  glargine (LANTUS  SOLOSTAR) 100 UNIT/ML Solostar Pen Inject 50 Units into the skin daily. 30 mL 4   silver  sulfADIAZINE  (SILVADENE ) 1 % cream Apply 1 Application topically daily. (Patient not taking: Reported on 12/14/2023) 50 g 0   tadalafil  (CIALIS ) 20 MG tablet Take 0.5-1 tablets (10-20 mg total) by mouth every other day as needed for erectile dysfunction. (Patient not taking: Reported on 12/14/2023) 10 tablet 11   vortioxetine  HBr (TRINTELLIX ) 10 MG TABS tablet Take 1 tablet (10 mg total) by mouth daily. (Patient not taking: Reported on 12/14/2023) 30 tablet 1   No current facility-administered medications for this visit.    PHYSICAL EXAM: Vitals:   12/14/23 0850  BP: 128/88  Pulse: (!) 101  Resp: 20  SpO2: 98%  Weight: (!) 306 lb 12.8 oz (139.2 kg)  Height: 6' 3 (1.905 m)   Body mass index is 38.35 kg/m.  Wt Readings from Last 3 Encounters:  12/14/23 (!) 306 lb 12.8 oz (139.2 kg)  12/08/23 297 lb 6.4 oz (134.9 kg)  09/21/23 (!) 305 lb (138.3 kg)    General: Well developed, well nourished male in no apparent distress.  HEENT: AT/[place], no external lesions.  Eyes: Conjunctiva clear and no icterus. Neck: Neck supple  Lungs: Respirations not labored Neurologic: Alert, oriented, normal speech Extremities / Skin: Dry.  Psychiatric: Does not  appear depressed or anxious  Diabetic Foot Exam - Simple   No data filed     LABS Reviewed Lab Results  Component Value Date   HGBA1C 10.4 (A) 12/08/2023   HGBA1C 12.4 (A) 07/11/2023   HGBA1C 12.3 (A) 11/09/2022   Lab Results  Component Value Date   FRUCTOSAMINE 254 03/11/2016   Lab Results  Component Value Date   CHOL 110 12/08/2023   HDL 29.50 (L) 12/08/2023   LDLCALC 49 12/08/2023   LDLDIRECT 46.0 11/09/2022   TRIG 157.0 (H) 12/08/2023   CHOLHDL 4 12/08/2023   Lab Results  Component Value Date   MICRALBCREAT 16.9 07/11/2023    MICRALBCREAT 4.5 04/26/2008   Lab Results  Component Value Date   CREATININE 1.02 12/08/2023   Lab Results  Component Value Date   GFR 91.49 12/08/2023    ASSESSMENT / PLAN  1. Type 2 diabetes mellitus with hyperglycemia, with long-term current use of insulin  (HCC)   2. Uncontrolled type 2 diabetes mellitus with hyperglycemia (HCC)     Diabetes Mellitus type 2, complicated by no other known complication. - Diabetic status / severity: Uncontrolled, improving  Lab Results  Component Value Date   HGBA1C 10.4 (A) 12/08/2023    - Hemoglobin A1c goal : <6.5%  Discussed about type 2 diabetes mellitus and potential chronic diabetic complications including diabetic retinopathy, neuropathy and nephropathy.  Discussed about importance of controlling blood sugar.  He has gradual improvement on diabetes control.  GMI on CGM 8.7%.  TSH and hemoglobin A1c improved from 12 to 10.4%.  - Medications: See below  Currently on relatively large dose of basal insulin  with no mealtime insulin .  He has significant hyperglycemia postprandially.  I would like to start mealtime insulin .  Adjusted diabetes regimen as follows.  I) decrease Lantus  from 40 units 2 times a day to 50 units daily. II) start NovoLog  15 units with meals 3 times a day.  Advised to take preferably 15 minutes before eating. III) increase Ozempic  from 1 mg to 2 mg weekly after 4 injections of 1 mg dose. IV) continue glipizide  10 mg 2 times a day for now.   - Home glucose testing: CGM Freestyle libre 3+.  Check as needed.  - Discussed/ Gave Hypoglycemia treatment plan.  # Consult : Referred to diabetic educator/dietitian.  Follow-up with pharmacist as well.  # Annual urine for microalbuminuria/ creatinine ratio, no microalbuminuria currently, continue ACE/ARB /losartan . Last  Lab Results  Component Value Date   MICRALBCREAT 16.9 07/11/2023    # Foot check nightly.  # Annual dilated diabetic eye exams.   - Diet: Make  healthy diabetic food choices, discussed about limiting carbohydrates.  Discussed about avoiding regular sodas. - Life style / activity / exercise: Discussed.  2. Blood pressure  -  BP Readings from Last 1 Encounters:  12/14/23 128/88    - Control is in target.  - No change in current plans.  3. Lipid status / Hyperlipidemia - Last  Lab Results  Component Value Date   LDLCALC 49 12/08/2023   - Continue rosuvastatin  20 mg daily.  Managed by PCP.  Diagnoses and all orders for this visit:  Type 2 diabetes mellitus with hyperglycemia, with long-term current use of insulin  (HCC) -     insulin  aspart (NOVOLOG  FLEXPEN) 100 UNIT/ML FlexPen; Inject 15 Units into the skin 3 (three) times daily with meals. -     Insulin  Pen Needle 32G X 4 MM MISC; Use 4x a day -  Semaglutide , 2 MG/DOSE, 8 MG/3ML SOPN; Inject 2 mg as directed once a week. -     Amb Referral to Nutrition and Diabetic Education  Uncontrolled type 2 diabetes mellitus with hyperglycemia (HCC) -     insulin  glargine (LANTUS  SOLOSTAR) 100 UNIT/ML Solostar Pen; Inject 50 Units into the skin daily.    DISPOSITION Follow up in clinic in 3  months suggested.   All questions answered and patient verbalized understanding of the plan.  Gerald Tillie Viverette, [name] [facility] Endocrinology [facility] [address] [place], [address] [place], [place] [address] Phone # [phone]  At least part of this note was generated using voice recognition software. Inadvertent word errors may have occurred, which were not recognized during the proofreading process.

## 2023-12-14 NOTE — Patient Instructions (Signed)
 Diabetes regimen:  Decrease lantus  to 50 units daily. Start novolog  15 units with meals three times a day, take 15 minutes before eating. Increase ozempic  to 2 mg weekly after 4 weeks of 1 mg dose. Continue glipizide  same for now.

## 2023-12-15 ENCOUNTER — Other Ambulatory Visit: Payer: Self-pay | Admitting: Emergency Medicine

## 2023-12-15 DIAGNOSIS — F901 Attention-deficit hyperactivity disorder, predominantly hyperactive type: Secondary | ICD-10-CM

## 2023-12-15 DIAGNOSIS — F321 Major depressive disorder, single episode, moderate: Secondary | ICD-10-CM

## 2023-12-15 NOTE — Telephone Encounter (Signed)
 PA was approved for patients [employer]  for the rest of this year per pharmacist when calling.     JNL

## 2023-12-18 ENCOUNTER — Other Ambulatory Visit: Payer: Self-pay | Admitting: Emergency Medicine

## 2023-12-18 DIAGNOSIS — E1165 Type 2 diabetes mellitus with hyperglycemia: Secondary | ICD-10-CM

## 2023-12-19 ENCOUNTER — Telehealth: Payer: Self-pay | Admitting: Pharmacy Technician

## 2023-12-19 ENCOUNTER — Other Ambulatory Visit

## 2023-12-19 NOTE — Progress Notes (Signed)
   12/19/2023  Patient ID: [name], male   DOB: [birth year], 43 y.o.   MRN: [id number]  Patient engaged with clinical pharmacist for management of diabetes on 12/05/2023. Outreach by [employer] technician was requested.   Outreached patient to discuss diabetes medication management. Phone just rings and then disconnects. Voicemail unable to be left.  Shaunte Tuft, CPhT [facility] Office: [phone] Email: [email or web address]

## 2023-12-20 DIAGNOSIS — H5213 Myopia, bilateral: Secondary | ICD-10-CM | POA: Diagnosis not present

## 2023-12-21 ENCOUNTER — Telehealth: Payer: Self-pay | Admitting: Pharmacy Technician

## 2023-12-21 ENCOUNTER — Other Ambulatory Visit: Payer: Self-pay | Admitting: Endocrinology

## 2023-12-21 ENCOUNTER — Encounter: Attending: Endocrinology | Admitting: Nutrition

## 2023-12-21 DIAGNOSIS — E1165 Type 2 diabetes mellitus with hyperglycemia: Secondary | ICD-10-CM

## 2023-12-21 MED ORDER — CEQUR SIMPLICITY INSERTER MISC: 0 refills | Status: DC

## 2023-12-21 MED ORDER — CEQUR SIMPLICITY 2U DEVI: 1.0000 | 11 refills | Status: DC

## 2023-12-21 NOTE — Progress Notes (Signed)
   12/21/2023 Name: [name] MRN: [id number] DOB: 01-21-1981  Patient is appearing on a report for [employer] [place] Metric Diabetes and last engaged with the clinical pharmacist to discuss diabetes on 12/05/2023. Contacted patient today to discuss diabetes management and completed medication review.   Diabetes Plan from last clinical pharmacist appointment:  Diabetes: - Currently uncontrolled, A1c goal <7% - Average BG decreased from 286 to 259 in the last 2 weeks - remains elevated, in target range only 12% - Reviewed long term cardiovascular and renal outcomes of uncontrolled blood sugar - Discussed dietary modifications: reduce juice consumption, increase protein/fiber, encouraged balanced meals - Continue ozempic  to 1 mg weekly  - Increase Lantus  to 80 units daily but split dose 40 units twice daily - Continue using Freestyle Libre to monitor glucose levels - recommend holding off on starting Jardiance  due to increased risk of DKA in A1c >10% -Scheduled patient for 3 month PCP f/u due to pt concern for testosterone  level/low energy     Medication Adherence Barriers Identified:  Patient made recommended medication changes per plan: Yes Patient has seen Endocrinology since is last office visit with PCP. Patient informs [name] decreased Lantus  dose to 50 units daily and added meal time Novolog  at 15 units 3 times a day. Inquired if patient was using this 15 minutes prior to eating and he informs yes. Patient also informs he will be increasing Ozempic  to 2mg  weekly on 12/25/23 as he took last dose of Ozempic  1mg  on 12/18/23. Patient also reports he takes Glipizide  10mg  twice a day.  Access issues with any new medication or testing device: No Per [name], 90 day supply of Lantus  filled on 12/15/23, Novolog  filled for 66 day supply on 12/15/23, Ozempic  2mg  filled on 8/7 for 90 day supply, Glipizide  sold on 6/6 for 90 days supply and Freestyle Sensors filled on 12/18/23 for 30 day supply.Patient does  not report any cost concerns at this time.  Patient is checking blood sugars as prescribed: Yes Patient uses CGM for checking blood sugar. He informs it is improving since starting mealtime insulin . He informs his blood sugars are ranging a little above 200 and he is not having as many instances of it being over 350. He reports he has only had 1 instance in the past week of a blood sugar less than 70. He reports headache when his blood sugar goes low. He reports his blood sugar did come back up [place] ifnorms he went to a nutrition appointment today and found the information useful. He informs nutrioinst was able to provide him tips and tricks to eating more healthy, carb counting and how to adjust mealtime insulin  based on the amount of carbs he eats.  Medication Adherence Barriers Addressed/Actions Taken:  Reviewed medication changes per plan from last clinical pharmacist note Educated patient to contact pharmacy regarding new prescriptions Reviewed instructions for monitoring blood sugars at home and reminded patient to keep a written log to review with pharmacist Reminded patient of date/time of upcoming clinical pharmacist follow up and any upcoming PCP/specialists visits. Patient denies transportation barriers to the appointment. Yes  Next clinical pharmacist appointment is scheduled for: 01/02/24 at 9:30am   [name], CPhT [facility] Office: [phone] Email: [name]

## 2023-12-21 NOTE — Patient Instructions (Signed)
 Limit sweet drinks-start using 1/2 and 1/2 and have only with low carb meal.  Then gradually limit the amount of sweet soda or tea and more diet or unsweet tee.  Flavor with lemon.  Try flavored waters.   Treat low blood sugar with 1/2 cup or 4 ounces of fruit juice wait 20-30 minutes.  Make sure all meals have protein, carbohydrates and small amount of fat at every meal.   Take Novolog  10 minutes before meals. Take 2u of Novolog  in the AM to prevent blood sugars from rising in the mornings.

## 2023-12-21 NOTE — Progress Notes (Signed)
 Patient is here today to review his diet and learn about insulin .   Medications;  Lantus  50u q AM  10:00AM                         Novolog  15u ac  just started last night.  FBS today was 100                       Ozempic :  just increased to 2mg  this week.  Says side effect is more gurd and reflux.  Says weight is down 10 pounds and feels like he is eating less. 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 No planned exercise.  Recovering from foot surgery X 3 months.  Suggested walking on days off for 20-30                minutes Diet:  all meals are eaten out.  Very high in fat and sugar.  Says would like to loose 30 pounds.   SBGM:  Libre 3 Plus.  FBS today was 100, but blood sugar rose to over 200  by 9AM  with no food                       Typical day: 7AM:  awake: up drinks water only until 2PM: 1:30PM  goes to work 2PM: meets crew and eats at gas station for breakfast:  slim and biscuit or roll.  6-7PM  [employer] 2X/wk; 2 pieces fried chicken mash potatoes and gravy biscuit with 1/2 1/2 sweet tea.  Drinks only 1/2 of this 16 ounce drink 11-12  something from 711 or like hamburger with cheese.  No fries  1/2 of a regular soda  2AM:  Bed Drinks water all day except with meals, needs a sweet drink   Discussion:   Timing of the 2 kinds of insulin  and why he needs both.  Storage, injection locations,  and importance of site rotation.   3 basic food groups and what foods are I each group, need for all three groups in each meal, but not too much of any one in the meal.  Stressed the need for limitation of sugared sweet drinks and will need more insulin  when drinking sweet drinks, which can cause weight gain.   Need for fruits and vegetables.  He likes fruits and instructed that these are carbs, and he should have at least 2-3 per day.  Serving sizes given for this.  Likes watermelon, and grapes.  He is currently eating 3X the amount of grapes and 5 X amount of watermelon.   Suggested that he increase his does of Novolog  by 2 units when eating watermelon especially if he is eating other carbs in the meal like bread or other starches like potatoes and corn or peas.  He agreed to do this. Low blood sugar.  Pt. Says he has experienced one apprised when his blood sugar was in the low 100s, and one when it was in the 70s.  Discussed symptoms and treatment options.  Pt. Was drinking fruit juices and was reminded to only drink this with a meal and limit it to no more than 4-5 ounces.  He agreed to do this. He was shown the CeQur for Novolog  delivery, and is very interested in trying this.  Believe overall understanding is good and motivation for needed dietary changes and weight loss is also good.  Cequr

## 2023-12-23 ENCOUNTER — Other Ambulatory Visit (HOSPITAL_COMMUNITY): Payer: Self-pay | Admitting: Physician Assistant

## 2023-12-23 ENCOUNTER — Other Ambulatory Visit: Payer: Self-pay | Admitting: Emergency Medicine

## 2023-12-23 DIAGNOSIS — M5412 Radiculopathy, cervical region: Secondary | ICD-10-CM

## 2023-12-23 DIAGNOSIS — M5090 Cervical disc disorder, unspecified, unspecified cervical region: Secondary | ICD-10-CM

## 2023-12-23 DIAGNOSIS — F333 Major depressive disorder, recurrent, severe with psychotic symptoms: Secondary | ICD-10-CM

## 2023-12-23 DIAGNOSIS — F411 Generalized anxiety disorder: Secondary | ICD-10-CM

## 2023-12-27 ENCOUNTER — Other Ambulatory Visit: Payer: Self-pay | Admitting: Emergency Medicine

## 2023-12-28 ENCOUNTER — Other Ambulatory Visit: Payer: Self-pay | Admitting: Emergency Medicine

## 2023-12-28 DIAGNOSIS — F321 Major depressive disorder, single episode, moderate: Secondary | ICD-10-CM

## 2023-12-28 DIAGNOSIS — F901 Attention-deficit hyperactivity disorder, predominantly hyperactive type: Secondary | ICD-10-CM

## 2023-12-29 ENCOUNTER — Other Ambulatory Visit: Payer: Self-pay

## 2023-12-29 ENCOUNTER — Telehealth: Payer: Self-pay | Admitting: Dietician

## 2023-12-29 DIAGNOSIS — E1165 Type 2 diabetes mellitus with hyperglycemia: Secondary | ICD-10-CM

## 2023-12-29 MED ORDER — INSULIN ASPART 100 UNIT/ML IJ SOLN: 15.0000 [IU] | Freq: Three times a day (TID) | INTRAMUSCULAR | 0 refills | Status: DC

## 2023-12-29 NOTE — Telephone Encounter (Signed)
 Novolog  via vial sent to pharmacy as requested.

## 2023-12-29 NOTE — Telephone Encounter (Signed)
 Returned patient call. He states that he is using the CeQur Simplicity insulin  delivery patch and likes this very much.  Currently his insulin  is prescribed only in a pen.  Instructed him to use the pens to fill this but will also message the nurse to send in a prescription for the Novolog  in a vial to his pharmacy.  Leita Constable, RD, LDN, CDCES, DipACLM

## 2024-01-02 ENCOUNTER — Other Ambulatory Visit: Admitting: Pharmacist

## 2024-01-02 DIAGNOSIS — Z7985 Long-term (current) use of injectable non-insulin antidiabetic drugs: Secondary | ICD-10-CM

## 2024-01-02 DIAGNOSIS — Z794 Long term (current) use of insulin: Secondary | ICD-10-CM

## 2024-01-02 DIAGNOSIS — E1165 Type 2 diabetes mellitus with hyperglycemia: Secondary | ICD-10-CM

## 2024-01-02 DIAGNOSIS — Z7984 Long term (current) use of oral hypoglycemic drugs: Secondary | ICD-10-CM

## 2024-01-02 NOTE — Patient Instructions (Addendum)
 It was a pleasure speaking with you today!  - Continue ozempic  2 mg weekly, Lantus  50 units daily, and Novolog  15 units three times daily before meals - Discontinue glipizide  due to hypoglycemia/low blood sugars - Continue using Freestyle Libre to monitor glucose levels  Next phone call will be 9/8 at 2 PM.  Feel free to call with any questions or concerns!  Darrelyn Drum, PharmD, BCPS, CPP Clinical Pharmacist Practitioner [facility] [phone]

## 2024-01-02 NOTE — Progress Notes (Signed)
 01/02/2024 Name: UTAH DELAUDER MRN: [id number] DOB: 19-Feb-1981  Chief Complaint  Patient presents with   Diabetes   Medication Management    [name] is a 43 y.o. year old male who presented for a telephone visit.   They were referred to the pharmacist by their PCP for assistance in managing diabetes.   Subjective:  Care Team: Primary Care Provider: Purcell Emil Schanz, MD ; Next Scheduled Visit: not scheduled Endocrinology initial visit 12/14/23  Medication Access/Adherence  Current Pharmacy:  [id number] [place], [place] - [address] AT [facility] OF [address] [place] [address] Phone: [phone] Fax: [phone]  [facility] - [facility] [address] [place] [address] Phone: [phone] Fax: [phone]  [id number] [place], [place] - [address] AT [facility] OF [address] [place] [address] Phone: [phone] Fax: [phone]  [facility] - [place], [place] - [address] [place] [address] Phone: [phone] Fax: [phone]   Patient reports affordability concerns with their medications: No  Patient reports access/transportation concerns to their pharmacy: No  Patient reports adherence concerns with their medications:  Yes    Diabetes:  Current medications: glipizide  10 mg twice daily, Ozempic  2 mg daily (started 3 weeks ago), Lantus  50 units daily, Novolog  15 units TID AC with CeQur Simplicity Patch  Medications tried in the past: metformin , Novolog   Pt reports experiencing significant heartburn and some GI upset when first increasing Ozempic  to 2 mg, however this has since resolved  Current glucose readings: Got pt set up with [employer]      Patient reports hypoglycemic s/sx including dizziness, shakiness, sweating.  Patient denies  hyperglycemic symptoms including polyuria, polydipsia, polyphagia, nocturia, neuropathy, blurred vision.   Current medication access support: Medicaid  Diet: Water/Pepsi Zero, Orange juice and grape fruit juice a few times week - notes he has cut back on juice/soda Mostly eating at home  Breakfast: sausage, egg, biscuit, bacon Lunch: not usually eating Dinner:   *He drives a lot and eats on the go or tends to snack more while working. He has continued working on reducing soda/juice.   Objective:  Lab Results  Component Value Date   HGBA1C 10.4 (A) 12/08/2023    Lab Results  Component Value Date   CREATININE 1.02 12/08/2023   BUN 13 12/08/2023   NA 138 12/08/2023   K 3.2 (L) 12/08/2023   CL 98 12/08/2023   CO2 33 (H) 12/08/2023    Lab Results  Component Value Date   CHOL 110 12/08/2023   HDL 29.50 (L) 12/08/2023   LDLCALC 49 12/08/2023   LDLDIRECT 46.0 11/09/2022   TRIG 157.0 (H) 12/08/2023   CHOLHDL 4 12/08/2023    Medications Reviewed Today     Reviewed by Merceda Lela SAUNDERS, RPH (Pharmacist) on 01/02/24 at 1532  Med List Status: <None>   Medication Order Taking? Sig Documenting Provider Last Dose Status Informant  acetaminophen  (TYLENOL ) 500 MG tablet [id number]  Take 1,000 mg by mouth every 6 (six) hours as needed for mild pain. [provider]  Active Self  amLODipine  (NORVASC ) 10 MG tablet [id number]  TAKE 1 TABLET BY MOUTH EVERY DAY Sagardia, Emil Schanz, MD  Active   ARIPiprazole  (ABILIFY ) 5 MG tablet [id number]  Take 1 tablet (5 mg total) by mouth daily. Nwoko, Uchenna E, PA  Active  atomoxetine  (STRATTERA ) 40 MG capsule [id number]  Take 1 capsule (40 mg total) by mouth daily. Needs appt with behavioral health for refill. Purcell Emil Schanz, MD  Active   blood glucose meter kit and supplies [id number]  Dispense based on patient and insurance preference. Use up to four times daily as directed. (FOR ICD-10 E10.9, E11.9). Sebastian Toribio GAILS, MD  Active             Med Note JULES, CYNTHIA A   Tue Jun 10, 2020  9:14 AM) sometimes  Continuous Glucose Sensor (FREESTYLE LIBRE 3 SENSOR) [place] [id number]  Place 1 sensor on the skin every 15 days. Use to check glucose continuously Purcell Emil Schanz, MD  Active   cyclobenzaprine  (FLEXERIL ) 10 MG tablet [id number]  TAKE 1 TABLET BY MOUTH THREE TIMES A DAY AS NEEDED FOR MUSCLE SPASM Purcell Emil Schanz, MD  Active   gabapentin  (NEURONTIN ) 300 MG capsule [id number]  TAKE 1 CAPSULE BY MOUTH THREE TIMES A DAY Sagardia, Miguel Jose, MD  Active   glipiZIDE  (GLUCOTROL ) 10 MG tablet [id number] Yes TAKE 1 TABLET (10 MG TOTAL) BY MOUTH TWICE A DAY BEFORE A MEAL Sagardia, Emil Schanz, MD  Active   hydrOXYzine  (ATARAX ) 25 MG tablet [id number]  Take 1 tablet (25 mg total) by mouth at bedtime as needed for anxiety. Nwoko, Uchenna E, PA  Active   injection device for insulin  (CEQUR SIMPLICITY 2U) [place]VI [id number]  1 each by Other route every 3 (three) days. Use up to 7 click ( give 14 units) before meals 3 times a day. Thapa, [place], MD  Active   Injection Device for Insulin  (CEQUR SIMPLICITY INSERTER) MISC [id number]  Use as advised Thapa, [place], MD  Active   insulin  aspart (NOVOLOG  FLEXPEN) 100 UNIT/ML FlexPen [id number]  Inject 15 Units into the skin 3 (three) times daily with meals. Thapa, [place], MD  Active   insulin  aspart (NOVOLOG ) 100 UNIT/ML injection [id number] Yes Inject 15 Units into the skin 3 (three) times daily before meals. Thapa, [place], MD  Active   insulin  glargine (LANTUS  SOLOSTAR) 100 UNIT/ML Solostar Pen [id number] Yes Inject 50 Units into the skin daily. Thapa, [place], MD  Active   Insulin  Pen Needle 32G X 4 MM MISC [id number]  Use 4x a day Thapa, [place], MD  Active   ketoconazole  (NIZORAL ) 2 % cream [id number]  Apply 1 Application topically daily.  Patient taking differently: Apply 1 Application topically daily as needed.   Lamount Ethan CROME, DPM  Active   loratadine (CLARITIN) 10 MG tablet [id number]  Take 10 mg by  mouth as needed for allergies. [provider]  Active   losartan -hydrochlorothiazide (HYZAAR) 100-12.5 MG tablet [id number]  TAKE 1 TABLET BY MOUTH EVERY DAY Sagardia, Miguel Jose, MD  Active   mupirocin  ointment (BACTROBAN ) 2 % [id number]  Apply 1 Application topically daily.  Patient taking differently: Apply 1 Application topically as needed.   Standiford, Marsa FALCON, DPM  Active   omeprazole  (PRILOSEC) 40 MG capsule [id number]  TAKE 1 CAPSULE (40 MG TOTAL) BY MOUTH 2 (TWO) TIMES DAILY BEFORE A MEAL. Avram Lupita BRAVO, MD  Active   OXcarbazepine  (TRILEPTAL ) 150 MG tablet [id number]  TAKE 1 TABLET BY MOUTH TWICE A DAY Camara, Amadou, MD  Active   rosuvastatin  (CRESTOR ) 20 MG tablet [id number]  TAKE 1 TABLET BY MOUTH EVERY DAY Sagardia, Miguel Jose, MD  Active   Semaglutide , 2 MG/DOSE, 8 MG/3ML SOPN [id number] Yes Inject 2 mg as directed once a week. Thapa, [place], MD  Active   sildenafil  (VIAGRA ) 100 MG tablet [id number]  TAKE 1/2 - 1 TABLET BY MOUTH DAILY AS NEEDED FOR ERECTILE DYSFUNCTION  Patient taking differently: as needed.   Sagardia, Miguel Jose, MD  Active   silver  sulfADIAZINE  (SILVADENE ) 1 % cream [id number]  Apply 1 Application topically daily.  Patient not taking: Reported on 12/14/2023   Gershon Donnice SAUNDERS, DPM  Active   tadalafil  (CIALIS ) 20 MG tablet [id number]  Take 0.5-1 tablets (10-20 mg total) by mouth every other day as needed for erectile dysfunction.  Patient not taking: Reported on 12/14/2023   Sagardia, Miguel Jose, MD  Active   Testosterone  Enanthate (XYOSTED ) 100 MG/0.5ML EMMANUEL [id number]  Inject 0.5 mLs (100 mg total) into the skin once a week. Sagardia, Miguel Jose, MD  Active   vortioxetine  HBr (TRINTELLIX ) 10 MG TABS tablet [id number]  Take 1 tablet (10 mg total) by mouth daily.  Patient not taking: Reported on 12/14/2023   Nwoko, Uchenna E, PA  Active             Assessment/Plan:   Diabetes: - Currently uncontrolled, A1c goal <7% - per [facility] data, average BG  much improved since starting meal-time insulin . He is experiencing hypoglycemia overnight and occasionally in the afternoon/evening - Discussed dietary modifications: reduce juice consumption, increase protein/fiber, encouraged balanced meals - Continue ozempic  2 mg weekly, Lantus  50 units daily, and Novolog  15 units TID AC - Discontinue glipizide  due to hypoglycemia - Continue using Freestyle Libre to monitor glucose levels   Follow Up Plan: 9/8  Darrelyn Drum, PharmD, BCPS, CPP Clinical Pharmacist Practitioner [facility] [phone]

## 2024-01-04 NOTE — Progress Notes (Deleted)
 BH MD/PA/NP OP Progress Note  01/04/2024 9:06 AM [name]  MRN:  [id number]  Visit Diagnosis: No diagnosis found.  Assessment:  [name] is a 43 year old male with a past psychiatric history significant for anxiety, depression, PTSD, mood disorder, and insomnia who presents to [facility] to establish psychiatric care and for medication management.   Patient presents to the encounter requesting medication management for his depression and anxiety.  Patient reports that he has been dealing with depression for roughly 10-year and has been on several different psychiatric medications including the following: Zoloft, hydroxyzine , duloxetine, trazodone , Lexapro, Prozac , Celexa , and Effexor .  In addition to his depression, patient endorses anxiety and panic attacks.  A PHQ-9 screen was performed with the patient scoring a 21.  A GAD-7 screen was also performed with the patient scoring a 21.  Patient notes that he has also been experiencing poor sleep and receives on average 4 hours of sleep per night.   In addition to his depression and anxiety, patient reports that he has experienced visual hallucinations characterized by shapes and flashes of light.  He reports that these symptoms have been going on for 3 years.   Provider recommended patient be placed on hydroxyzine  25 mg at bedtime as needed for the management of his sleep.  Provider also recommended patient be placed on Trintellix  10 mg daily for the management of his depressive symptoms and anxiety.  Lastly, provider recommended patient be placed on Abilify  5 mg daily for the management of psychotic features (visual hallucinations).  Patient was agreeable to recommendations.  Patient's medications to be e-prescribed to pharmacy of choice.   Provider discussed side effects associated with the use of patient's medications prior to the conclusion of the encounte  Risk Assessment: An assessment of  suicide and violence risk factors was performed as part of this evaluation and is not significantly changed from the last visit. While future psychiatric events cannot be accurately predicted, the patient does not currently require acute inpatient psychiatric care and does not currently meet [place]  involuntary commitment criteria. Patient was given contact information for crisis resources, behavioral health clinic and was instructed to call 911 for emergencies.   [name] presents for follow-up evaluation. Today, 01/04/24, patient reports ***  1. PTSD (post-traumatic stress disorder) (Primary)   2. GAD (generalized anxiety disorder)   - hydrOXYzine  (ATARAX ) 25 MG tablet; Take 1 tablet (25 mg total) by mouth at bedtime as needed for anxiety.  Dispense: 30 tablet; Refill: 1 - vortioxetine  HBr (TRINTELLIX ) 10 MG TABS tablet; Take 1 tablet (10 mg total) by mouth daily.  Dispense: 30 tablet; Refill: 1   3. Severe episode of recurrent major depressive disorder, with psychotic features (HCC)   - ARIPiprazole  (ABILIFY ) 5 MG tablet; Take 1 tablet (5 mg total) by mouth daily.  Dispense: 30 tablet; Refill: 1 - vortioxetine  HBr (TRINTELLIX ) 10 MG TABS tablet; Take 1 tablet (10 mg total) by mouth daily.  Dispense: 30 tablet; Refill: 1  Chief Complaint: No chief complaint on file.  HPI:   [name] is a 43 year old male with a past psychiatric history significant for anxiety, depression, PTSD, mood disorder, and insomnia who presents to [facility] to establish psychiatric care and for medication management.   Patient presents to the encounter stating that he has been diagnosed with several different psychiatric diagnoses and is requesting medication management.  Patient reports that he has been receiving help  on and off with his mental health throughout the years.  He reports that when his mental health is well managed, he does not reach out for  help but when he is not doing well, he will seek help from a psychiatric provider.  Patient reports that his anxiety and depression are his greatest concerns today.   Patient has been dealing with depression for the past 10 years.  Patient rates his depression an 8 out of 10 with 10 being most severe.  Patient endorses depressive episodes every day with symptoms lasting the whole day.  Patient endorses the following depressive symptoms: feelings of sadness, lack of motivation, decreased concentration, decreased energy, irritability, feelings of guilt/worthlessness, and hopelessness.  Patient denies any worsening factors to his depression.  He denies any alleviating factors to his depression.  Patient reports that he has been on the following psychiatric medications in the past: Zoloft, Lexapro, Prozac , Celexa , Effexor , trazodone , and duloxetine.   In addition to his depression, patient endorses anxiety and rates his anxiety a 9 out of 10.  Patient reports that he experiences panic attacks a few times per week and denies any specific triggers.  Patient does report that he recently was evicted from his previous place of residence.  He reports that his sons are living with his family while he is currently living within a daycare center owned by his mother.  He reports that his relationship with his parents could be better; however, he reports that he would like to spend the least amount of time with his family as he possibly can.  Patient denies having any motivation to find a place of his own and believes that it is attributed to his depression and anxiety.   In addition to his depression and anxiety, patient reports that he has experienced visual hallucinations in the past.  Patient reports that he sees spots and shapes that he knows are not there.  He also reports that he sees flashes of light in his field of vision every day.  Patient reports that these symptoms have been going on for roughly 3 years.    Patient reports that he has been hospitalized in the past.  He reports that his ex-wife had him IVC'd in 2018 and he ended up spending a night at [facility] ED.  Patient denies a past history of suicide attempt. A PHQ-9 screen was performed with the patient scoring a 21.  A GAD-7 screen was also performed with the patient scoring a 21.   Patient is alert and oriented x 4, calm, cooperative, and fully engaged in conversation during the encounter.  Patient endorses irritable mood.  Patient exhibits depressed mood with appropriate affect.  Patient denies suicidal or homicidal ideations.  He further denies auditory or visual hallucinations and does not appear to be responding to internal/external stimuli.  Patient denies paranoia but states that he is always aware of his surroundings.  Patient denies delusional thoughts.  Patient endorses poor sleep and receives on average 4 hours of sleep per night.  Patient endorses fair appetite and eats on average 2 meals per day.  Patient endorses alcohol consumption sparingly.  Patient endorses the use of smokeless tobacco and uses smokeless tobacco every day.  Patient denies illicit drug use.   Past Psychiatric History:  Patient endorses a past psychiatric history significant for anxiety, depression, PTSD, mood disorder, and insomnia.   Patient endorses a past history of hospitalization due to mental health. - In 2018, he reports that his  ex-wife had an IVC and he stayed overnight at [facility] ED   Patient denies a past history of suicide attempt.   Patient denies a past history of homicide attempt.  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    Pneumonia    PTSD (post-traumatic stress disorder)    Scoliosis    Sleep apnea     Past Surgical History:  Procedure Laterality Date   LUMBAR LAMINECTOMY/DECOMPRESSION MICRODISCECTOMY Left 05/05/2016   Procedure: CENTRAL DECOMPRESSION L4-L5 AND FORAMINOTOMY FOR L4 ROOT AND L5 ROOT ON THE LEFT;  Surgeon: Tanda Heading, MD;  Location: [facility] ORS;  Service: Orthopedics;  Laterality: Left;   METATARSAL HEAD EXCISION Left 09/21/2023   Procedure: EXCISION, METATARSAL BONE, HEAD and graft application;  Surgeon: Malvin Marsa FALCON, DPM;  Location: [facility] ORS;  Service: Orthopedics/Podiatry;  Laterality: Left;  3RD LEFT TOE AND BIOPSY OF 2ND toe   NO PAST SURGERIES     ulcer excision and I&D of abscess by Podiatry   04/2023   [facility]    Family Psychiatric History:  Patient denies a family history of mental illness.   Family History:  Family History  Problem Relation Age of Onset   Stroke Maternal Grandmother    Colon cancer Neg Hx    Esophageal cancer Neg Hx    Rectal cancer Neg Hx    Stomach cancer Neg Hx     Social History:  Social History   Socioeconomic History   Marital status: Divorced    Spouse name: Not on file   Number of children: Not on file   Years of education: Not on file   Highest education level: Associate degree: occupational, Scientist, product/process development, or vocational program  Occupational History   Not on file  Tobacco Use   Smoking status: Former    Types: Cigars   Smokeless tobacco: Current    Types: Snuff   Tobacco comments:    Patient states will consider quitting in future but not interested right now.   Vaping Use   Vaping status: Never Used  Substance and Sexual Activity   Alcohol use: Not Currently    Comment: few times year   Drug use: No   Sexual activity: Yes    Partners: Female    Comment: multiple partners.  Other Topics Concern   Not on file  Social History Narrative   Lives with family   Social  Drivers of Health   Financial Resource Strain: High Risk (10/19/2023)   Overall Financial Resource Strain (CARDIA)    Difficulty of Paying Living Expenses: Hard  Food Insecurity: No Food Insecurity (10/19/2023)   Hunger Vital Sign    Worried About Running Out of Food in the Last Year: Never true    Ran Out of Food in the Last Year: Never true  Transportation Needs: No Transportation Needs (07/07/2023)   PRAPARE - Administrator, Civil Service (Medical): No    Lack of Transportation (Non-Medical): No  Physical Activity: Inactive (10/19/2023)   Exercise Vital Sign  Days of Exercise per Week: 0 days    Minutes of Exercise per Session: 0 min  Stress: Stress Concern Present (10/19/2023)   [employer] of Occupational Health - Occupational Stress Questionnaire    Feeling of Stress : Very much  Social Connections: Moderately Integrated (07/07/2023)   Social Connection and Isolation Panel    Frequency of Communication with Friends and Family: More than three times a week    Frequency of Social Gatherings with Friends and Family: Twice a week    Attends Religious Services: More than 4 times per year    Active Member of [employer] or Organizations: Yes    Attends Engineer, structural: More than 4 times per year    Marital Status: Divorced    Allergies:  Allergies  Allergen Reactions   Amoxicillin  Anaphylaxis and Other (See Comments)    Has patient had a PCN reaction causing immediate rash, facial/tongue/throat swelling, SOB or lightheadedness with hypotension: yes Has patient had a PCN reaction causing severe rash involving mucus membranes or skin necrosis: no Has patient had a PCN reaction that required hospitalization yes Has patient had a PCN reaction occurring within the last 10 years: yes If all of the above answers are NO, then may proceed with Cephalosporin use.   Penicillins Anaphylaxis and Other (See Comments)    Patient was told to list this as an allergy  because he is allergic to Amoxicillin  Has patient had a PCN reaction causing immediate rash, facial/tongue/throat swelling, SOB or lightheadedness with hypotension: yes for Amoxicillin  Has patient had a PCN reaction causing severe rash involving mucus membranes or skin necrosis: no Has patient had a PCN reaction that required hospitalization yes for Amoxicillin  Has patient had a PCN reaction occurring within the last 10 years: yes for Amoxicillin  If all o   Metformin  And Related Other (See Comments)    SEVERE GI UPSET   Other Other (See Comments)    Powder in some gloves - localized itching but not allergic to latex, benadryl  usually helps with this reaction   Latex Itching   Zolpidem Other (See Comments)    Passed out. And memory loss    Current Medications: Current Outpatient Medications  Medication Sig Dispense Refill   acetaminophen  (TYLENOL ) 500 MG tablet Take 1,000 mg by mouth every 6 (six) hours as needed for mild pain.     amLODipine  (NORVASC ) 10 MG tablet TAKE 1 TABLET BY MOUTH EVERY DAY 90 tablet 3   ARIPiprazole  (ABILIFY ) 5 MG tablet Take 1 tablet (5 mg total) by mouth daily. 30 tablet 1   atomoxetine  (STRATTERA ) 40 MG capsule Take 1 capsule (40 mg total) by mouth daily. Needs appt with behavioral health for refill. 30 capsule 0   blood glucose meter kit and supplies Dispense based on patient and insurance preference. Use up to four times daily as directed. (FOR ICD-10 E10.9, E11.9). 1 each 0   Continuous Glucose Sensor (FREESTYLE LIBRE 3 SENSOR) MISC Place 1 sensor on the skin every 15 days. Use to check glucose continuously 2 each 5   cyclobenzaprine  (FLEXERIL ) 10 MG tablet TAKE 1 TABLET BY MOUTH THREE TIMES A DAY AS NEEDED FOR MUSCLE SPASM 90 tablet 1   gabapentin  (NEURONTIN ) 300 MG capsule TAKE 1 CAPSULE BY MOUTH THREE TIMES A DAY 270 capsule 0   hydrOXYzine  (ATARAX ) 25 MG tablet Take 1 tablet (25 mg total) by mouth at bedtime as needed for anxiety. 30 tablet 1   injection  device for insulin  (CEQUR SIMPLICITY  2U) DEVI 1 each by Other route every 3 (three) days. Use up to 7 click ( give 14 units) before meals 3 times a day. 10 each 11   Injection Device for Insulin  (CEQUR SIMPLICITY INSERTER) MISC Use as advised 1 each 0   insulin  aspart (NOVOLOG  FLEXPEN) 100 UNIT/ML FlexPen Inject 15 Units into the skin 3 (three) times daily with meals. 30 mL 5   insulin  aspart (NOVOLOG ) 100 UNIT/ML injection Inject 15 Units into the skin 3 (three) times daily before meals. 50 mL 0   insulin  glargine (LANTUS  SOLOSTAR) 100 UNIT/ML Solostar Pen Inject 50 Units into the skin daily. 30 mL 4   Insulin  Pen Needle 32G X 4 MM MISC Use 4x a day 300 each 3   ketoconazole  (NIZORAL ) 2 % cream Apply 1 Application topically daily. (Patient taking differently: Apply 1 Application topically daily as needed.) 60 g 2   loratadine (CLARITIN) 10 MG tablet Take 10 mg by mouth as needed for allergies.     losartan -hydrochlorothiazide (HYZAAR) 100-12.5 MG tablet TAKE 1 TABLET BY MOUTH EVERY DAY 90 tablet 3   mupirocin  ointment (BACTROBAN ) 2 % Apply 1 Application topically daily. (Patient taking differently: Apply 1 Application topically as needed.) 22 g 0   omeprazole  (PRILOSEC) 40 MG capsule TAKE 1 CAPSULE (40 MG TOTAL) BY MOUTH 2 (TWO) TIMES DAILY BEFORE A MEAL. 180 capsule 0   OXcarbazepine  (TRILEPTAL ) 150 MG tablet TAKE 1 TABLET BY MOUTH TWICE A DAY 180 tablet 1   rosuvastatin  (CRESTOR ) 20 MG tablet TAKE 1 TABLET BY MOUTH EVERY DAY 90 tablet 3   Semaglutide , 2 MG/DOSE, 8 MG/3ML SOPN Inject 2 mg as directed once a week. 9 mL 3   sildenafil  (VIAGRA ) 100 MG tablet TAKE 1/2 - 1 TABLET BY MOUTH DAILY AS NEEDED FOR ERECTILE DYSFUNCTION (Patient taking differently: as needed.) 5 tablet 11   silver  sulfADIAZINE  (SILVADENE ) 1 % cream Apply 1 Application topically daily. (Patient not taking: Reported on 12/14/2023) 50 g 0   tadalafil  (CIALIS ) 20 MG tablet Take 0.5-1 tablets (10-20 mg total) by mouth every other day  as needed for erectile dysfunction. (Patient not taking: Reported on 12/14/2023) 10 tablet 11   Testosterone  Enanthate (XYOSTED ) 100 MG/0.5ML SOAJ Inject 0.5 mLs (100 mg total) into the skin once a week. 1.96 mL 5   vortioxetine  HBr (TRINTELLIX ) 10 MG TABS tablet Take 1 tablet (10 mg total) by mouth daily. (Patient not taking: Reported on 12/14/2023) 30 tablet 1   No current facility-administered medications for this visit.     Musculoskeletal: Strength & Muscle Tone: {desc; muscle tone:32375} Gait & Station: {PE GAIT ED WJUO:77474} Patient leans: {Patient Leans:21022755}  Psychiatric Specialty Exam: There were no vitals taken for this visit.There is no height or weight on file to calculate BMI. Review of Systems  General Appearance: {Appearance:22683}  Eye Contact:  {BHH EYE CONTACT:22684}  Speech:  {Speech:22685}  Volume:  {Volume (PAA):22686}  Mood:  {BHH MOOD:22306}  Affect:  {Affect (PAA):22687}  Thought Content: {Thought Content:22690}   Suicidal Thoughts:  {ST/HT (PAA):22692}  Homicidal Thoughts:  {ST/HT (PAA):22692}  Thought Process:  {Thought Process (PAA):22688}  Orientation:  {BHH ORIENTATION (PAA):22689}    Memory: {BHH MEMORY:22881}  Judgment:  {Judgement (PAA):22694}  Insight:  {Insight (PAA):22695}  Concentration:  {Concentration:21399}  Recall:  not formally assessed ***  Fund of Knowledge: {BHH GOOD/FAIR/POOR:22877}  Language: {BHH GOOD/FAIR/POOR:22877}  Psychomotor Activity:  {Psychomotor (PAA):22696}  Akathisia:  {BHH YES OR NO:22294}  AIMS (if indicated): {Desc; done/not:10129}  Assets:  {Assets (PAA):22698}  ADL's:  {BHH JIO'D:77709}  Cognition: {chl bhh cognition:304700322}  Sleep:  {BHH GOOD/FAIR/POOR:22877}   Metabolic Disorder Labs: Lab Results  Component Value Date   HGBA1C 10.4 (A) 12/08/2023   MPG 275 01/15/2019   MPG 240 04/28/2016   No results found for: PROLACTIN Lab Results  Component Value Date   CHOL 110 12/08/2023   TRIG 157.0 (H)  12/08/2023   HDL 29.50 (L) 12/08/2023   CHOLHDL 4 12/08/2023   VLDL 31.4 12/08/2023   LDLCALC 49 12/08/2023   LDLCALC 34 07/11/2023   Lab Results  Component Value Date   TSH 2.359 07/11/2014   TSH 1.03 03/11/2008    Therapeutic Level Labs: No results found for: LITHIUM No results found for: VALPROATE No results found for: CBMZ   Screenings: AUDIT    Flowsheet Row Office Visit from 07/11/2023 in [facility]  Alcohol Use Disorder Identification Test Final Score (AUDIT) 3    GAD-7    Flowsheet Row Office Visit from 12/01/2023 in [facility] Counselor from 10/19/2023 in [facility] Office Visit from 09/01/2023 in [facility] Office Visit from 11/09/2022 in [facility] [place] Office Visit from 02/07/2020 in [facility] Care at [facility]  Total GAD-7 Score 21 20 19 21  0   PHQ2-9    Flowsheet Row Office Visit from 12/01/2023 in [facility] Counselor from 10/19/2023 in [facility] Office Visit from 09/01/2023 in [facility] [place] Office Visit from 07/11/2023 in [facility] [place] Office Visit from 11/09/2022 in [facility] [place]  PHQ-2 Total Score 5 4 2 3 6   PHQ-9 Total Score 21 20 16 14 22    Flowsheet Row Office Visit from 12/01/2023 in [facility] Counselor from 10/19/2023 in  [facility] Admission (Discharged) from 09/21/2023 in [facility]  C-SSRS RISK CATEGORY No Risk No Risk No Risk    Collaboration of Care: Collaboration of Care: [facility] OP Collaboration of Care:[id number]}  Patient/Guardian was advised Release of Information must be obtained prior to any record release in order to collaborate their care with  an outside provider. Patient/Guardian was advised if they have not already done so to contact the registration department to sign all necessary forms in order for us  to release information regarding their care.   Consent: Patient/Guardian gives verbal consent for treatment and assignment of benefits for services provided during this visit. Patient/Guardian expressed understanding and agreed to proceed.    Jesten Cappuccio, MD 01/04/2024, 9:06 AM

## 2024-01-13 ENCOUNTER — Encounter (HOSPITAL_COMMUNITY)

## 2024-01-16 ENCOUNTER — Other Ambulatory Visit (INDEPENDENT_AMBULATORY_CARE_PROVIDER_SITE_OTHER): Admitting: Pharmacist

## 2024-01-16 DIAGNOSIS — E1165 Type 2 diabetes mellitus with hyperglycemia: Secondary | ICD-10-CM

## 2024-01-16 DIAGNOSIS — Z794 Long term (current) use of insulin: Secondary | ICD-10-CM

## 2024-01-16 NOTE — Patient Instructions (Signed)
 It was a pleasure speaking with you today!  Continue your current regimen.  Feel free to call with any questions or concerns!  Arbutus Leas, PharmD, BCPS, CPP Clinical Pharmacist Practitioner [facility] [phone]

## 2024-01-16 NOTE — Progress Notes (Signed)
 01/16/2024 Name: [name] MRN: [id number] DOB: [birth year]  Chief Complaint  Patient presents with   Diabetes   Medication Management    [name] is a 43 y.o. year old male who presented for a telephone visit.   They were referred to the pharmacist by their PCP for assistance in managing diabetes.   Subjective:  Care Team: Primary Care Provider: Purcell Emil Schanz, MD ; Next Scheduled Visit: not scheduled Endocrinology initial visit 12/14/23  Medication Access/Adherence  Current Pharmacy:  [id number] [place], [place] - [address] AT [facility] OF [address]  [place] [address] Phone: [phone] Fax: [phone]  [facility] - [facility] [address], [address] [place] [address] Phone: [phone] Fax: [phone]  [id number] - [place], [place] - [address] AT [facility] OF [address] [place] [address] Phone: [phone] Fax: [phone]  [facility] [place], [place] - [address] [place] [address] Phone: [phone] Fax: [phone]   Patient reports affordability concerns with their medications: No  Patient reports access/transportation concerns to their pharmacy: No  Patient reports adherence concerns with their medications:  Yes    Diabetes:  Current medications: Ozempic  2 mg daily (started 6 weeks ago), Lantus  50 units daily, Novolog  15 units TID AC with CeQur Simplicity Patch  Medications tried in the past: metformin , Novolog   Denies any recurring side effects from Ozempic  2 mg  Current glucose readings: Got pt set up with [name]      Patient reports hypoglycemic s/sx including dizziness, shakiness, sweating.  Patient denies hyperglycemic symptoms including polyuria, polydipsia, polyphagia, nocturia, neuropathy, blurred  vision.   Current medication access support: Medicaid  Diet: Water/Pepsi Zero, Orange juice and grape fruit juice a few times week - notes he has cut back on juice/soda Mostly eating at home  Breakfast: sausage, egg, biscuit, bacon Lunch: not usually eating Dinner:   *He drives a lot and eats on the go or tends to snack more while working. He has continued working on reducing soda/juice.   Objective:  Lab Results  Component Value Date   HGBA1C 10.4 (A) 12/08/2023    Lab Results  Component Value Date   CREATININE 1.02 12/08/2023   BUN 13 12/08/2023   NA 138 12/08/2023   K 3.2 (L) 12/08/2023   CL 98 12/08/2023   CO2 33 (H) 12/08/2023    Lab Results  Component Value Date   CHOL 110 12/08/2023   HDL 29.50 (L) 12/08/2023   LDLCALC 49 12/08/2023   LDLDIRECT 46.0 11/09/2022   TRIG 157.0 (H) 12/08/2023   CHOLHDL 4 12/08/2023    Medications Reviewed Today     Reviewed by Merceda Lela SAUNDERS, RPH (Pharmacist) on 01/16/24 at 1645  Med List Status: <None>   Medication Order Taking? Sig Documenting Provider Last Dose Status Informant  acetaminophen  (TYLENOL ) 500 MG tablet [id number]  Take 1,000 mg by mouth every 6 (six) hours as needed for mild pain. [provider]  Active Self  amLODipine  (NORVASC ) 10 MG tablet [id number]  TAKE 1 TABLET BY MOUTH EVERY DAY Sagardia, Emil Schanz, MD  Active   ARIPiprazole  (ABILIFY ) 5 MG tablet [id number]  Take 1 tablet (5 mg total) by mouth daily. Nwoko, Uchenna E, PA  Active   atomoxetine  (STRATTERA ) 40 MG capsule [id number]  Take 1 capsule (40 mg total) by mouth daily. Needs  appt with behavioral health for refill. Purcell Emil Schanz, MD  Active   blood glucose meter kit and supplies [id number]  Dispense based on patient and insurance preference. Use up to four times daily as directed. (FOR ICD-10 E10.9, E11.9). Sebastian Toribio GAILS, MD  Active            Med Note JULES, CYNTHIA A   Tue Jun 10, 2020  9:14 AM) sometimes  Continuous  Glucose Sensor (FREESTYLE LIBRE 3 SENSOR) [place] [id number]  Place 1 sensor on the skin every 15 days. Use to check glucose continuously Purcell Emil Schanz, MD  Active   cyclobenzaprine  (FLEXERIL ) 10 MG tablet [id number]  TAKE 1 TABLET BY MOUTH THREE TIMES A DAY AS NEEDED FOR MUSCLE SPASM Purcell Emil Schanz, MD  Active   gabapentin  (NEURONTIN ) 300 MG capsule [id number]  TAKE 1 CAPSULE BY MOUTH THREE TIMES A DAY Sagardia, Miguel Jose, MD  Active   hydrOXYzine  (ATARAX ) 25 MG tablet [id number]  Take 1 tablet (25 mg total) by mouth at bedtime as needed for anxiety. Nwoko, Uchenna E, PA  Active   injection device for insulin  (CEQUR SIMPLICITY 2U) [place] [id number] Yes 1 each by Other route every 3 (three) days. Use up to 7 click ( give 14 units) before meals 3 times a day. Thapa, [place], MD  Active   Injection Device for Insulin  [facility] SIMPLICITY INSERTER) MISC [id number] Yes Use as advised Thapa, [place], MD  Active     Discontinued 01/16/24 1645 (Duplicate)   insulin  aspart (NOVOLOG ) 100 UNIT/ML injection [id number] Yes Inject 15 Units into the skin 3 (three) times daily before meals. Thapa, [place], MD  Active   insulin  glargine (LANTUS  SOLOSTAR) 100 UNIT/ML Solostar Pen [id number] Yes Inject 50 Units into the skin daily. Thapa, [place], MD  Active   Insulin  Pen Needle 32G X 4 MM MISC [id number]  Use 4x a day Thapa, [place], MD  Active   ketoconazole  (NIZORAL ) 2 % cream [id number]  Apply 1 Application topically daily.  Patient taking differently: Apply 1 Application topically daily as needed.   Lamount Ethan CROME, DPM  Active   loratadine (CLARITIN) 10 MG tablet [id number]  Take 10 mg by mouth as needed for allergies. [provider]  Active   losartan -hydrochlorothiazide (HYZAAR) 100-12.5 MG tablet [id number]  TAKE 1 TABLET BY MOUTH EVERY DAY Purcell Emil Schanz, MD  Active   mupirocin  ointment (BACTROBAN ) 2 % [id number]  Apply 1 Application topically daily.  Patient taking differently: Apply 1 Application  topically as needed.   Standiford, Marsa FALCON, [place]  Active   omeprazole  (PRILOSEC) 40 MG capsule [id number]  TAKE 1 CAPSULE (40 MG TOTAL) BY MOUTH 2 (TWO) TIMES DAILY BEFORE A MEAL. Avram Lupita BRAVO, MD  Active   OXcarbazepine  (TRILEPTAL ) 150 MG tablet [id number]  TAKE 1 TABLET BY MOUTH TWICE A DAY Camara, Amadou, MD  Active   rosuvastatin  (CRESTOR ) 20 MG tablet [id number]  TAKE 1 TABLET BY MOUTH EVERY DAY Sagardia, Miguel Jose, MD  Active   Semaglutide , 2 MG/DOSE, 8 MG/3ML SOPN [id number] Yes Inject 2 mg as directed once a week. Thapa, [place], MD  Active   sildenafil  (VIAGRA ) 100 MG tablet [id number]  TAKE 1/2 - 1 TABLET BY MOUTH DAILY AS NEEDED FOR ERECTILE DYSFUNCTION  Patient taking differently: as needed.   Sagardia, Miguel Jose, MD  Active   silver  sulfADIAZINE  (SILVADENE ) 1 % cream [id number]  Apply 1 Application topically daily.  Patient not taking: Reported on 12/14/2023   Gershon Donnice SAUNDERS, DPM  Active   tadalafil  (CIALIS ) 20 MG tablet [id number]  Take 0.5-1 tablets (10-20 mg total) by mouth every other day as needed for erectile dysfunction.  Patient not taking: Reported on 12/14/2023   Sagardia, Miguel Jose, MD  Active   Testosterone  Enanthate (XYOSTED ) 100 MG/0.5ML EMMANUEL [id number]  Inject 0.5 mLs (100 mg total) into the skin once a week. Sagardia, Miguel Jose, MD  Active   vortioxetine  HBr (TRINTELLIX ) 10 MG TABS tablet [id number]  Take 1 tablet (10 mg total) by mouth daily.  Patient not taking: Reported on 12/14/2023   Collene Reginia BRAVO, PA  Active             Assessment/Plan:   Diabetes: - Currently controlled, A1c goal <7% - per [facility] data, average BG much improved and hypoglycemia has improved after discontinuing glipizide  - Discussed dietary modifications: reduce juice consumption, increase protein/fiber, encouraged balanced meals - Continue ozempic  2 mg weekly, Lantus  50 units daily, and Novolog  15 units TID AC - Continue using Freestyle Libre to monitor glucose levels - Consider  Jardiance  if BG remains borderline   Follow Up Plan: 10/6  Darrelyn Drum, PharmD, BCPS, CPP Clinical Pharmacist Practitioner [name] [phone]
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
## 2024-01-23 ENCOUNTER — Other Ambulatory Visit: Payer: Self-pay | Admitting: Emergency Medicine

## 2024-01-23 DIAGNOSIS — F901 Attention-deficit hyperactivity disorder, predominantly hyperactive type: Secondary | ICD-10-CM

## 2024-01-23 DIAGNOSIS — F321 Major depressive disorder, single episode, moderate: Secondary | ICD-10-CM

## 2024-01-31 ENCOUNTER — Telehealth (HOSPITAL_COMMUNITY): Payer: Self-pay

## 2024-01-31 NOTE — Telephone Encounter (Signed)
 received fax requesting a refill on the atomoxetine  hcl 40mg . pt was last seen on 07-24 next appt 09-25

## 2024-02-02 ENCOUNTER — Encounter: Payer: Self-pay | Admitting: Podiatry

## 2024-02-02 ENCOUNTER — Ambulatory Visit (INDEPENDENT_AMBULATORY_CARE_PROVIDER_SITE_OTHER): Admitting: Podiatry

## 2024-02-02 ENCOUNTER — Encounter (HOSPITAL_COMMUNITY): Payer: Self-pay

## 2024-02-02 ENCOUNTER — Ambulatory Visit (INDEPENDENT_AMBULATORY_CARE_PROVIDER_SITE_OTHER)

## 2024-02-02 ENCOUNTER — Ambulatory Visit: Admitting: Podiatry

## 2024-02-02 DIAGNOSIS — E119 Type 2 diabetes mellitus without complications: Secondary | ICD-10-CM

## 2024-02-02 DIAGNOSIS — F411 Generalized anxiety disorder: Secondary | ICD-10-CM

## 2024-02-02 DIAGNOSIS — M79675 Pain in left toe(s): Secondary | ICD-10-CM

## 2024-02-02 DIAGNOSIS — M79674 Pain in right toe(s): Secondary | ICD-10-CM

## 2024-02-02 DIAGNOSIS — F333 Major depressive disorder, recurrent, severe with psychotic symptoms: Secondary | ICD-10-CM

## 2024-02-02 DIAGNOSIS — B351 Tinea unguium: Secondary | ICD-10-CM | POA: Diagnosis not present

## 2024-02-02 MED ORDER — HYDROXYZINE HCL 25 MG PO TABS: 25.0000 mg | ORAL_TABLET | Freq: Two times a day (BID) | ORAL | 1 refills | Status: AC | PRN

## 2024-02-02 MED ORDER — ARIPIPRAZOLE 5 MG PO TABS: 5.0000 mg | ORAL_TABLET | Freq: Every day | ORAL | 1 refills | Status: AC

## 2024-02-02 MED ORDER — VORTIOXETINE HBR 10 MG PO TABS: 10.0000 mg | ORAL_TABLET | Freq: Every day | ORAL | 1 refills | Status: AC

## 2024-02-02 NOTE — Progress Notes (Signed)

## 2024-02-09 NOTE — Telephone Encounter (Signed)
 received fax requesting atomoxetine  40mg . 2nd request.

## 2024-02-13 ENCOUNTER — Telehealth (HOSPITAL_COMMUNITY): Payer: Self-pay

## 2024-02-13 ENCOUNTER — Other Ambulatory Visit (INDEPENDENT_AMBULATORY_CARE_PROVIDER_SITE_OTHER): Admitting: Pharmacist

## 2024-02-13 DIAGNOSIS — E1165 Type 2 diabetes mellitus with hyperglycemia: Secondary | ICD-10-CM

## 2024-02-13 NOTE — Patient Instructions (Signed)
 It was a pleasure speaking with you today!  Continue current regimen - work on diet and taking insulin  prior to each meal for improved blood sugars.  Feel free to call with any questions or concerns!  Darrelyn Drum, PharmD, BCPS, CPP Clinical Pharmacist Practitioner [place] [facility] [phone]

## 2024-02-13 NOTE — Telephone Encounter (Signed)
 received fax requesting a refill on the atomoxetine  hcl 40mg  . pt was last seen on 7-24 next appt 11-13

## 2024-02-13 NOTE — Progress Notes (Signed)
 02/13/2024 Name: [name] MRN: [id number] DOB: 1980/07/21  Chief Complaint  Patient presents with   Diabetes   Medication Management    [name] is a 43 y.o. year old male who presented for a telephone visit.   They were referred to the pharmacist by their PCP for assistance in managing diabetes.   Subjective:  Care Team: Primary Care Provider: Purcell Emil Schanz, MD ; Next Scheduled Visit: not scheduled Endocrinology initial visit 03/13/24  Medication Access/Adherence  Current Pharmacy:  [id number] [place], [place] - [address] AT [facility] OF [address] [place] [address] Phone: [phone] Fax: [phone]  [facility] [address] [place] [address] Phone: [phone] Fax: [phone]  [id number] [place], [place] - [address] AT [facility] OF [address] [place] [address] Phone: [phone] Fax: [phone]  [facility], [place] - [address] [place] [address] Phone: [phone] Fax: [phone]   Patient reports affordability concerns with their medications: No  Patient reports access/transportation concerns to their pharmacy: No  Patient reports adherence concerns with their medications:  Yes    Diabetes:  Current medications: Ozempic  2 mg daily, Lantus  50 units daily, Novolog  15 units TID AC with CeQur Simplicity Patch  Medications tried in the past: metformin , Novolog   Pt notes he was out of Cequr patch for about a week, which caused him to be less adherent with Novolog  His last [place] sensor fell off, waiting for refill to be available at the pharmacy  Current glucose readings: Got pt set up with [facility]      Patient reports hypoglycemic s/sx including dizziness, shakiness, sweating.  Patient denies  hyperglycemic symptoms including polyuria, polydipsia, polyphagia, nocturia, neuropathy, blurred vision.   Current medication access support: Medicaid  Diet: Water/Pepsi Zero, Orange juice and grape fruit juice a few times week - notes he has cut back on juice/soda Mostly eating at home  Breakfast: sausage, egg, biscuit, bacon Lunch: not usually eating Dinner:   *He drives a lot and eats on the go or tends to snack more while working. He has continued working on reducing soda/juice.   Objective:  Lab Results  Component Value Date   HGBA1C 10.4 (A) 12/08/2023    Lab Results  Component Value Date   CREATININE 1.02 12/08/2023   BUN 13 12/08/2023   NA 138 12/08/2023   K 3.2 (L) 12/08/2023   CL 98 12/08/2023   CO2 33 (H) 12/08/2023    Lab Results  Component Value Date   CHOL 110 12/08/2023   HDL 29.50 (L) 12/08/2023   LDLCALC 49 12/08/2023   LDLDIRECT 46.0 11/09/2022   TRIG 157.0 (H) 12/08/2023   CHOLHDL 4 12/08/2023    Medications Reviewed Today   Medications were not reviewed in this encounter     Assessment/Plan:   Diabetes: - Currently controlled, A1c goal <7% - per [facility] data, average BG has increased since last month, possibly related to nonadherence from lack of insulin  patch and possibly dietary changes. Average was at goal when previously using CeQur patch at current insulin  doses. - Discussed dietary modifications: reduce juice consumption, increase protein/fiber, encouraged balanced meals - Continue ozempic  2 mg weekly, Lantus  50 units daily, and Novolog  15 units TID AC using CeQur patch - Continue using Freestyle Libre to monitor glucose levels -  Consider Jardiance  if BG remains borderline   Follow Up Plan: 11/10  [name], PharmD, BCPS, CPP Clinical Pharmacist Practitioner [place] [facility] [phone]

## 2024-02-14 ENCOUNTER — Ambulatory Visit (HOSPITAL_COMMUNITY): Admitting: Licensed Clinical Social Worker

## 2024-02-16 NOTE — Telephone Encounter (Signed)
 Message urology and reviewed.  When patient was seen by this provider, provider was not prescribing atomoxetine  40 mg daily.  Per chart review, it appears that medication is prescribed by me Emil Aloysius Schaumann, MD and was last prescribed by this provider on 11/22/2023.

## 2024-02-22 ENCOUNTER — Other Ambulatory Visit (HOSPITAL_COMMUNITY): Payer: Self-pay

## 2024-02-27 ENCOUNTER — Ambulatory Visit (INDEPENDENT_AMBULATORY_CARE_PROVIDER_SITE_OTHER): Payer: MEDICAID | Admitting: Licensed Clinical Social Worker

## 2024-02-27 DIAGNOSIS — F411 Generalized anxiety disorder: Secondary | ICD-10-CM

## 2024-02-27 DIAGNOSIS — F4310 Post-traumatic stress disorder, unspecified: Secondary | ICD-10-CM

## 2024-02-27 NOTE — Progress Notes (Signed)
 THERAPIST PROGRESS NOTE  Virtual Visit via Video Note  I connected with Hershal CHRISTELLA Lease on 02/27/24 at  3:00 PM EDT by a video enabled telemedicine application and verified that I am speaking with the correct person using two identifiers.  Location: Patient: [facility]  Provider: Providers Home office    I discussed the limitations of evaluation and management by telemedicine and the availability of in person appointments. The patient expressed understanding and agreed to proceed.      I discussed the assessment and treatment plan with the patient. The patient was provided an opportunity to ask questions and all were answered. The patient agreed with the plan and demonstrated an understanding of the instructions.   The patient was advised to call back or seek an in-person evaluation if the symptoms worsen or if the condition fails to improve as anticipated.  I provided 45 minutes of non-face-to-face time during this encounter.   [name], LCSW   Participation Level: Active  Behavioral Response: CasualAlertAnxious and Depressed  Type of Therapy: Individual Therapy  Treatment Goals addressed:  Active     BH CCP Acute or Chronic Trauma Reaction     LTG: Recall traumatic events without becoming overwhelmed with negative emotions (Progressing)     Start:  10/19/23    Expected End:  05/11/24         STG: Hershal will complete at least 80% of assigned homework  (Progressing)     Start:  10/19/23    Expected End:  05/11/24         STG: Hershal will cooperate with and complete psychological testing to help evaluate trauma symptoms (Progressing)     Start:  10/19/23    Expected End:  05/11/24         STG: Hershal will describe the signs and symptoms of PTSD that are experienced and how they interfere with daily living (Progressing)     Start:  10/19/23    Expected End:  05/11/24         STG: Hershal will identify internal and external stimuli that trigger  PTSD symptoms     Start:  10/19/23    Expected End:  05/11/24         STG: Christie will acknowledge that healing from PTSD is a gradual process     Start:  10/19/23    Expected End:  05/11/24         STG: Javad will identify coping strategies to deal with trauma memories and the associated emotional reaction     Start:  10/19/23    Expected End:  05/11/24         STG: Darryon will cooperate with a medication evaluation by accurately reporting symptoms, if present     Start:  10/19/23    Expected End:  05/11/24         Work with Hershal to track symptoms, triggers, and/or skill use through a mood chart, diary card, or journal     Start:  10/19/23         Encourage Clee to participate in recovery peer support activities      Start:  10/19/23         Glorious Hershal to take psychotropic medication as prescribed     Start:  10/19/23         Glorious Hershal to communicate effects of prescribed medications     Start:  10/19/23         Provide Hershal  with education on trauma-oriented therapy     Start:  10/19/23         Provide and outline the treatment process to Jens, explaining that it will include a gradual processing of the details and feelings associated with the trauma and developing new, more appropriate coping strategies     Start:  10/19/23         Cooperate with trauma-focused psychotherapy techniques to reduce emotional reaction to the traumatic event      Start:  10/19/23         Refer Hershal to a psychiatrist for a consultation regarding medication management of symptoms      Start:  10/19/23         Assess Adynn's substance use pattern; refer or treat the victim for chemical dependence if indicated      Start:  10/19/23           OP Depression     LTG: Reduce frequency, intensity, and duration of depression symptoms so that daily functioning is improved (Progressing)     Start:  10/19/23    Expected End:  05/11/24          LTG: Increase coping skills to manage depression and improve ability to perform daily activities (Progressing)     Start:  10/19/23    Expected End:  05/11/24         STG: Hershal will attend at least 80% of scheduled family sessions  (Not Applicable)     Start:  10/19/23    Expected End:  05/11/24    Resolved:  12/13/23      Work with Hershal to track symptoms, triggers, and/or skill use through a mood chart, diary card, or journal     Start:  10/19/23         Encourage Hershal to participate in recovery peer support activities weekly  (Completed)     Start:  10/19/23    End:  12/13/23      Provide Hershal educational information and reading material on dissociation, its causes, and symptoms (Completed)     Start:  10/19/23    End:  12/13/23      Work with Hershal to identify the major components of a recent episode of depression: physical symptoms, major thoughts and images, and major behaviors they experienced (Completed)     Start:  10/19/23    End:  12/13/23      Therapist will educate patient on cognitive distortions and the rationale for treatment of depression (Completed)     Start:  10/19/23    End:  12/13/23         ProgressTowards Goals: Progressing  Interventions: CBT, Motivational Interviewing, and Supportive  Summary: SUTTON HIRSCH is a 43 y.o. male was alert and oriented x 5.  He was pleasant, cooperative, maintained good eye contact.  Patient engaged well in therapy session was dressed casually.  He presented with anxious and depressed mood\affect.  Patient comes in today with primary stressors as transition.  He reports that he plans on moving to [place]  in early January 2026.  This has been in the work for some time as he plans on moving in with a friend that he has been talking to romantically.  He reports that she has a place that they can all stay at but they will eventually need to find a bigger place that she has 3 kids and he has 2 kids.  Penne  states that he is nervous  because he still has not told his parents and there is a clause in his custody agreement with his wife that she needs to be notified of any move out of state.  Patient's next steps will be to discuss this with a lawyer to see what the steps he needs to take by the family court system.    LCSW and patient discussed today practicing communication in the near to discuss points of why he is moving with his parents and the justification that he has support.  LCSW encouraged patient to utilize assertive communication by respecting himself, respecting his parents, and setting firm, healthy boundaries.  LCSW and patient discussed today LCSW transfer on April 22, 2024 to [facility] health office.  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  LCSW also spoke with patient about getting/asking medication provider for a 90-day supply of his medications starting January 1 to bridge his medications while he finds medication provider in [place]  state.  Patient verbalized understanding.  Suicidal/Homicidal: No  Therapist Response:     Intervention/plan: LCSW utilized psychoanalytic therapy for patient to express thoughts, feelings and concerns and nonjudgmental environment.  LCSW educated patient on assertive communication as evidenced by statements above.  LCSW utilized person centered therapy for empowerment.  LCSW utilized motivational interviewing for open-ended questions, reflective listening, and positive affirmations.  Plan: Return again in 4 weeks.  Diagnosis: PTSD (post-traumatic stress disorder)  GAD (generalized anxiety disorder)  Collaboration of Care: Other Continued individual therapy   Patient/Guardian was advised Release of Information must be obtained prior to any record release in order to collaborate  their care with an outside provider. Patient/Guardian was advised if they have not already done so to contact the registration department to sign all necessary forms in order for us  to release information regarding their care.   Consent: Patient/Guardian gives verbal consent for treatment and assignment of benefits for services provided during this visit. Patient/Guardian expressed understanding and agreed to proceed.   [name], LCSW 02/27/2024

## 2024-02-29 ENCOUNTER — Ambulatory Visit: Admitting: Neurology

## 2024-03-05 ENCOUNTER — Telehealth: Payer: Self-pay

## 2024-03-05 DIAGNOSIS — Z7189 Other specified counseling: Secondary | ICD-10-CM

## 2024-03-05 NOTE — Progress Notes (Signed)
 Complex Care Management Note Care Guide Note  03/05/2024 Name: [name] MRN: [id number] DOB: 07/01/1980   Complex Care Management Outreach Attempts: An unsuccessful telephone outreach was attempted today to offer the patient information about available complex care management services.  Follow Up Plan:  Additional outreach attempts will be made to offer the patient complex care management information and services.   Encounter Outcome:  No Answer  Jeoffrey Buffalo , RMA     [place]  [facility] Guide  Direct Dial : [phone]  Website: [email or web address]

## 2024-03-06 ENCOUNTER — Ambulatory Visit (HOSPITAL_COMMUNITY): Admitting: Licensed Clinical Social Worker
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
## 2024-03-13 ENCOUNTER — Ambulatory Visit: Admitting: Emergency Medicine

## 2024-03-13 ENCOUNTER — Ambulatory Visit (INDEPENDENT_AMBULATORY_CARE_PROVIDER_SITE_OTHER): Payer: MEDICAID | Admitting: Licensed Clinical Social Worker

## 2024-03-13 DIAGNOSIS — F411 Generalized anxiety disorder: Secondary | ICD-10-CM

## 2024-03-13 DIAGNOSIS — F4310 Post-traumatic stress disorder, unspecified: Secondary | ICD-10-CM

## 2024-03-13 NOTE — Progress Notes (Signed)
 THERAPIST PROGRESS NOTE  Session Time: 27   Participation Level: Active  Behavioral Response: Casual and Well GroomedAlertAnxious and Depressed  Type of Therapy: Individual Therapy  Treatment Goals addressed:  Active     BH CCP Acute or Chronic Trauma Reaction     LTG: Recall traumatic events without becoming overwhelmed with negative emotions (Completed/Met)     Start:  10/19/23    Expected End:  05/11/24    Resolved:  03/13/24      STG: [name] will complete at least 80% of assigned homework  (Completed/Met)     Start:  10/19/23    Expected End:  05/11/24    Resolved:  03/13/24      STG: [name] will cooperate with and complete psychological testing to help evaluate trauma symptoms (Completed/Met)     Start:  10/19/23    Expected End:  05/11/24    Resolved:  03/13/24      STG: [name] will describe the signs and symptoms of PTSD that are experienced and how they interfere with daily living (Progressing)     Start:  10/19/23    Expected End:  05/11/24         STG: [name] will identify internal and external stimuli that trigger PTSD symptoms (Progressing)     Start:  10/19/23    Expected End:  05/11/24         STG: [name] will acknowledge that healing from PTSD is a gradual process (Progressing)     Start:  10/19/23    Expected End:  05/11/24         STG: [name] will identify coping strategies to deal with trauma memories and the associated emotional reaction (Progressing)     Start:  10/19/23    Expected End:  05/11/24         STG: [name] will cooperate with a medication evaluation by accurately reporting symptoms, if present (Progressing)     Start:  10/19/23    Expected End:  05/11/24         Work with [name] to track symptoms, triggers, and/or skill use through a mood chart, diary card, or journal     Start:  10/19/23         Encourage [name] to participate in recovery peer support activities      Start:  10/19/23         [name]  to take psychotropic medication as prescribed (Completed)     Start:  10/19/23    End:  03/13/24      [name] to communicate effects of prescribed medications (Completed)     Start:  10/19/23    End:  03/13/24      Provide [name] with education on trauma-oriented therapy (Completed)     Start:  10/19/23    End:  03/13/24      Provide and outline the treatment process to [name], explaining that it will include a gradual processing of the details and feelings associated with the trauma and developing new, more appropriate coping strategies (Completed)     Start:  10/19/23    End:  03/13/24      Cooperate with trauma-focused psychotherapy techniques to reduce emotional reaction to the traumatic event  (Completed)     Start:  10/19/23    End:  03/13/24      Refer [name] to a psychiatrist for a consultation regarding medication management of symptoms  (Completed)     Start:  10/19/23    End:  03/13/24      Assess [name]'s substance use pattern; refer or treat the victim for chemical dependence if indicated  (Completed)     Start:  10/19/23    End:  03/13/24        OP Depression     LTG: Reduce frequency, intensity, and duration of depression symptoms so that daily functioning is improved (Progressing)     Start:  10/19/23    Expected End:  05/11/24         LTG: Increase coping skills to manage depression and improve ability to perform daily activities (Completed/Met)     Start:  10/19/23    Expected End:  05/11/24    Resolved:  03/13/24      STG: [name] will attend at least 80% of scheduled family sessions  (Not Applicable)     Start:  10/19/23    Expected End:  05/11/24    Resolved:  12/13/23      Work with [name] to track symptoms, triggers, and/or skill use through a mood chart, diary card, or journal     Start:  10/19/23         Encourage [name] to participate in recovery peer support activities weekly  (Completed)     Start:  10/19/23     End:  12/13/23      Provide [name] educational information and reading material on dissociation, its causes, and symptoms (Completed)     Start:  10/19/23    End:  12/13/23      Work with [name] to identify the major components of a recent episode of depression: physical symptoms, major thoughts and images, and major behaviors they experienced (Completed)     Start:  10/19/23    End:  12/13/23      Therapist will educate patient on cognitive distortions and the rationale for treatment of depression (Completed)     Start:  10/19/23    End:  12/13/23         ProgressTowards Goals: Progressing  Interventions: CBT, Motivational Interviewing, and Supportive  Summary: [name] is a 43 y.o. male who presents with    Suicidal/Homicidal: Nowithout intent/plan  Therapist Response:   S: Subjective  A&O x5; pleasant, cooperative, maintained good eye contact; casually dressed.  Reports stress related to planned move to [place]  in January.  LCSW informed patient about need to establish new therapy/medication providers if relocation occurs.  Aware of LCSW's transfer to [facility] office; agreeable to transition plan.  Reports tension, worry, and restlessness about telling parents of move; has lived with them since divorce.  Feels confident move is right decision but fears parents' reaction.  O: Objective  Mood: Anxious; Affect: congruent.  Thought process: Logical, coherent.  Insight/Judgment: Fair-good.  Behavior: Engaged, cooperative.  No SI/HI.  A: Assessment  Anxiety r/t upcoming relocation and anticipated family conflict.  Demonstrates insight and motivation to improve coping and communication.  No acute safety concerns.  P: Plan / Intervention  Provided education on assertive communication and nonverbal skills for confidence in discussing move.  Utilized CBT for cognitive restructuring/reframing anxious thoughts.  Applied  psychodynamic therapy for emotional expression in nonjudgmental setting.  Discussed continuity of care options for both relocation and LCSW transfer.  Continue therapy to support transition and reduce anxiety.   Plan: Return again in 4 weeks.  Diagnosis: PTSD (post-traumatic stress disorder)  GAD (generalized anxiety disorder)  Collaboration of Care: Pt to have last session with LCSW in 4 week due  to LCSW transferring. Pt self reported moving to [place] and will seek services out there   Patient/Guardian was advised Release of Information must be obtained prior to any record release in order to collaborate their care with an outside provider. Patient/Guardian was advised if they have not already done so to contact the registration department to sign all necessary forms in order for us  to release information regarding their care.   Consent: Patient/Guardian gives verbal consent for treatment and assignment of benefits for services provided during this visit. Patient/Guardian expressed understanding and agreed to proceed.   [name], LCSW 03/13/2024

## 2024-03-14 ENCOUNTER — Telehealth: Payer: Self-pay

## 2024-03-14 ENCOUNTER — Encounter: Payer: Self-pay | Admitting: Emergency Medicine

## 2024-03-14 ENCOUNTER — Ambulatory Visit (INDEPENDENT_AMBULATORY_CARE_PROVIDER_SITE_OTHER): Payer: MEDICAID | Admitting: Emergency Medicine

## 2024-03-14 ENCOUNTER — Other Ambulatory Visit (HOSPITAL_COMMUNITY): Payer: Self-pay

## 2024-03-14 VITALS — BP 128/74 | HR 64 | Temp 98.4°F | Ht 75.0 in | Wt 292.0 lb

## 2024-03-14 DIAGNOSIS — E1165 Type 2 diabetes mellitus with hyperglycemia: Secondary | ICD-10-CM | POA: Diagnosis not present

## 2024-03-14 DIAGNOSIS — E1159 Type 2 diabetes mellitus with other circulatory complications: Secondary | ICD-10-CM

## 2024-03-14 DIAGNOSIS — E1169 Type 2 diabetes mellitus with other specified complication: Secondary | ICD-10-CM | POA: Diagnosis not present

## 2024-03-14 DIAGNOSIS — I152 Hypertension secondary to endocrine disorders: Secondary | ICD-10-CM

## 2024-03-14 DIAGNOSIS — Z7985 Long-term (current) use of injectable non-insulin antidiabetic drugs: Secondary | ICD-10-CM

## 2024-03-14 DIAGNOSIS — N5201 Erectile dysfunction due to arterial insufficiency: Secondary | ICD-10-CM | POA: Diagnosis not present

## 2024-03-14 DIAGNOSIS — E291 Testicular hypofunction: Secondary | ICD-10-CM | POA: Diagnosis not present

## 2024-03-14 DIAGNOSIS — E785 Hyperlipidemia, unspecified: Secondary | ICD-10-CM

## 2024-03-14 LAB — COMPREHENSIVE METABOLIC PANEL WITH GFR
ALT: 19 U/L (ref 0–53)
AST: 17 U/L (ref 0–37)
Albumin: 4.3 g/dL (ref 3.5–5.2)
Alkaline Phosphatase: 48 U/L (ref 39–117)
BUN: 12 mg/dL (ref 6–23)
CO2: 32 meq/L (ref 19–32)
Calcium: 9.2 mg/dL (ref 8.4–10.5)
Chloride: 99 meq/L (ref 96–112)
Creatinine, Ser: 0.98 mg/dL (ref 0.40–1.50)
GFR: 95.81 mL/min (ref >60.00)
Glucose, Bld: 229 mg/dL — ABNORMAL HIGH (ref 70–99)
Potassium: 3.1 meq/L — ABNORMAL LOW (ref 3.5–5.1)
Sodium: 139 meq/L (ref 135–145)
Total Bilirubin: 0.5 mg/dL (ref 0.2–1.2)
Total Protein: 7.4 g/dL (ref 6.0–8.3)

## 2024-03-14 LAB — POCT GLYCOSYLATED HEMOGLOBIN (HGB A1C): HbA1c POC (<> result, manual entry): 8.4 % (ref 4.0–5.6)

## 2024-03-14 LAB — LIPID PANEL
Cholesterol: 116 mg/dL (ref 0–200)
HDL: 33.9 mg/dL — ABNORMAL LOW (ref >39.00)
LDL Cholesterol: 47 mg/dL (ref 0–99)
NonHDL: 82.18
Total CHOL/HDL Ratio: 3
Triglycerides: 178 mg/dL — ABNORMAL HIGH (ref 0.0–149.0)
VLDL: 35.6 mg/dL (ref 0.0–40.0)

## 2024-03-14 NOTE — Assessment & Plan Note (Signed)
 Eating better and losing weight.  Semaglutide  helping Diet and nutrition discussed Advised to decrease amount of daily carbohydrate intake and daily calories and increase amount of plant-based protein in his diet Benefits of exercise discussed

## 2024-03-14 NOTE — Assessment & Plan Note (Signed)
 Much improved diabetes with hemoglobin A1c at 8.4 Continues insulin  and semaglutide  Continue rosuvastatin  20 mg daily Diet and nutrition discussed

## 2024-03-14 NOTE — Patient Instructions (Signed)
 Health Maintenance, Male  Adopting a healthy lifestyle and getting preventive care are important in promoting health and wellness. Ask your health care provider about:  The right schedule for you to have regular tests and exams.  Things you can do on your own to prevent diseases and keep yourself healthy.  What should I know about diet, weight, and exercise?  Eat a healthy diet    Eat a diet that includes plenty of vegetables, fruits, low-fat dairy products, and lean protein.  Do not eat a lot of foods that are high in solid fats, added sugars, or sodium.  Maintain a healthy weight  Body mass index (BMI) is a measurement that can be used to identify possible weight problems. It estimates body fat based on height and weight. Your health care provider can help determine your BMI and help you achieve or maintain a healthy weight.  Get regular exercise  Get regular exercise. This is one of the most important things you can do for your health. Most adults should:  Exercise for at least 150 minutes each week. The exercise should increase your heart rate and make you sweat (moderate-intensity exercise).  Do strengthening exercises at least twice a week. This is in addition to the moderate-intensity exercise.  Spend less time sitting. Even light physical activity can be beneficial.  Watch cholesterol and blood lipids  Have your blood tested for lipids and cholesterol at 41 years of age, then have this test every 5 years.  You may need to have your cholesterol levels checked more often if:  Your lipid or cholesterol levels are high.  You are older than 41 years of age.  You are at high risk for heart disease.  What should I know about cancer screening?  Many types of cancers can be detected early and may often be prevented. Depending on your health history and family history, you may need to have cancer screening at various ages. This may include screening for:  Colorectal cancer.  Prostate cancer.  Skin cancer.  Lung  cancer.  What should I know about heart disease, diabetes, and high blood pressure?  Blood pressure and heart disease  High blood pressure causes heart disease and increases the risk of stroke. This is more likely to develop in people who have high blood pressure readings or are overweight.  Talk with your health care provider about your target blood pressure readings.  Have your blood pressure checked:  Every 3-5 years if you are 41-41 years of age.  Every year if you are 3 years old or older.  If you are between the ages of 41 and 41 and are a current or former smoker, ask your health care provider if you should have a one-time screening for abdominal aortic aneurysm (AAA).  Diabetes  Have regular diabetes screenings. This checks your fasting blood sugar level. Have the screening done:  Once every three years after age 41 if you are at a normal weight and have a low risk for diabetes.  More often and at a younger age if you are overweight or have a high risk for diabetes.  What should I know about preventing infection?  Hepatitis B  If you have a higher risk for hepatitis B, you should be screened for this virus. Talk with your health care provider to find out if you are at risk for hepatitis B infection.  Hepatitis C  Blood testing is recommended for:  Everyone born from [birth year] through 1963.  Anyone  with known risk factors for hepatitis C.  Sexually transmitted infections (STIs)  You should be screened each year for STIs, including gonorrhea and chlamydia, if:  You are sexually active and are younger than 41 years of age.  You are older than 41 years of age and your health care provider tells you that you are at risk for this type of infection.  Your sexual activity has changed since you were last screened, and you are at increased risk for chlamydia or gonorrhea. Ask your health care provider if you are at risk.  Ask your health care provider about whether you are at high risk for HIV. Your health care provider  may recommend a prescription medicine to help prevent HIV infection. If you choose to take medicine to prevent HIV, you should first get tested for HIV. You should then be tested every 3 months for as long as you are taking the medicine.  Follow these instructions at home:  Alcohol use  Do not drink alcohol if your health care provider tells you not to drink.  If you drink alcohol:  Limit how much you have to 0-2 drinks a day.  Know how much alcohol is in your drink. In the [place] one drink equals one 12 oz bottle of beer (355 mL), one 5 oz glass of wine (148 mL), or one 1 oz glass of hard liquor (44 mL).  Lifestyle  Do not use any products that contain nicotine or tobacco. These products include cigarettes, chewing tobacco, and vaping devices, such as e-cigarettes. If you need help quitting, ask your health care provider.  Do not use street drugs.  Do not share needles.  Ask your health care provider for help if you need support or information about quitting drugs.  General instructions  Schedule regular health, dental, and eye exams.  Stay current with your vaccines.  Tell your health care provider if:  You often feel depressed.  You have ever been abused or do not feel safe at home.  Summary  Adopting a healthy lifestyle and getting preventive care are important in promoting health and wellness.  Follow your health care provider's instructions about healthy diet, exercising, and getting tested or screened for diseases.  Follow your health care provider's instructions on monitoring your cholesterol and blood pressure.  This information is not intended to replace advice given to you by your health care provider. Make sure you discuss any questions you have with your health care provider.  Document Revised: 09/15/2020 Document Reviewed: 09/15/2020  Elsevier Patient Education  2024 [employer].

## 2024-03-14 NOTE — Assessment & Plan Note (Signed)
 BP Readings from Last 3 Encounters:  03/14/24 130/80  12/14/23 128/88  12/08/23 122/84  Well-controlled hypertension Continue amlodipine  10 mg along with Hyzaar 100-12.5 mg daily Much improved diabetes with hemoglobin A1c at 8.4. Continue daily basal insulin  50 units along with Premeal insulin  10 to 15 units.  Continues weekly semaglutide  2 mg Not taking glipizide  due to hypoglycemic episodes Cardiovascular risks associated with hypertension and diabetes discussed Diet and nutrition discussed Follow-up in 3 months

## 2024-03-14 NOTE — Telephone Encounter (Signed)
 Pharmacy Patient Advocate Encounter   Received notification from CoverMyMeds that prior authorization for Ozempic  (2 MG/DOSE) 8MG /3ML pen-injectors is required/requested.   Insurance verification completed.   The patient is insured through [place] [employer]ICA[employer].   Per test claim: PA required; PA submitted to above mentioned insurance via Latent Key/confirmation #/[id number] Status is pending

## 2024-03-14 NOTE — Progress Notes (Signed)
 [name] 43 y.o.   Chief Complaint  Patient presents with   Follow-up    HISTORY OF PRESENT ILLNESS: This is a 43 y.o. male here for 80-month follow-up of chronic medical conditions including hypertension and diabetes Since our last visit he was able to follow-up with endocrinologist who made the following changes: Currently on relatively large dose of basal insulin  with no mealtime insulin .  He has significant hyperglycemia postprandially.  I would like to start mealtime insulin .  Adjusted diabetes regimen as follows.   I) decrease Lantus  from 40 units 2 times a day to 50 units daily. II) start NovoLog  15 units with meals 3 times a day.  Advised to take preferably 15 minutes before eating. III) increase Ozempic  from 1 mg to 2 mg weekly after 4 injections of 1 mg dose. IV) continue glipizide  10 mg 2 times a day for now.     - Home glucose testing: CGM Freestyle libre 3+.  Check as needed.   - Discussed/ Gave Hypoglycemia treatment plan.   # Consult : Referred to diabetic educator/dietitian.  Follow-up with pharmacist as well.  Lab Results  Component Value Date   HGBA1C 10.4 (A) 12/08/2023   Wt Readings from Last 3 Encounters:  03/14/24 292 lb (132.5 kg)  12/14/23 (!) 306 lb 12.8 oz (139.2 kg)  12/08/23 297 lb 6.4 oz (134.9 kg)    Feeling much better.  Has no complaints or medical concerns today.    HPI   Prior to Admission medications   Medication Sig Start Date End Date Taking? Authorizing Provider  acetaminophen  (TYLENOL ) 500 MG tablet Take 1,000 mg by mouth every 6 (six) hours as needed for mild pain.    [provider]  amLODipine  (NORVASC ) 10 MG tablet TAKE 1 TABLET BY MOUTH EVERY DAY 10/13/23   Purcell Emil Schanz, MD  ARIPiprazole  (ABILIFY ) 5 MG tablet Take 1 tablet (5 mg total) by mouth daily. 02/02/24   Kapoor, Sahil, MD  atomoxetine  (STRATTERA ) 40 MG capsule Take 1 capsule (40 mg total) by mouth daily. Needs appt with behavioral health for  refill. 11/22/23   Purcell Emil Schanz, MD  blood glucose meter kit and supplies Dispense based on patient and insurance preference. Use up to four times daily as directed. (FOR ICD-10 E10.9, E11.9). 01/16/19   Sebastian Toribio GAILS, MD  Continuous Glucose Sensor (FREESTYLE LIBRE 3 SENSOR) MISC Place 1 sensor on the skin every 15 days. Use to check glucose continuously 09/27/23   Purcell Emil Schanz, MD  cyclobenzaprine  (FLEXERIL ) 10 MG tablet TAKE 1 TABLET BY MOUTH THREE TIMES A DAY AS NEEDED FOR MUSCLE SPASM 12/27/23   Purcell Emil Schanz, MD  gabapentin  (NEURONTIN ) 300 MG capsule TAKE 1 CAPSULE BY MOUTH THREE TIMES A DAY 12/23/23   Purcell Emil Schanz, MD  hydrOXYzine  (ATARAX ) 25 MG tablet Take 1 tablet (25 mg total) by mouth 2 (two) times daily as needed for anxiety. 02/02/24   Kapoor, Sahil, MD  injection device for insulin  (CEQUR SIMPLICITY 2U) DEVI 1 each by Other route every 3 (three) days. Use up to 7 click ( give 14 units) before meals 3 times a day. 12/21/23   Thapa, Sudan, MD  Injection Device for Insulin  (CEQUR SIMPLICITY INSERTER) MISC Use as advised 12/21/23   Thapa, Sudan, MD  insulin  aspart (NOVOLOG ) 100 UNIT/ML injection Inject 15 Units into the skin 3 (three) times daily before meals. 12/29/23   Thapa, Sudan, MD  insulin  glargine (LANTUS  SOLOSTAR) 100 UNIT/ML Solostar Pen Inject  50 Units into the skin daily. 12/14/23   Thapa, Sudan, MD  Insulin  Pen Needle 32G X 4 MM MISC Use 4x a day 12/14/23   Thapa, Sudan, MD  ketoconazole  (NIZORAL ) 2 % cream Apply 1 Application topically daily. Patient taking differently: Apply 1 Application topically daily as needed. 09/16/23   Lamount Ethan CROME, DPM  loratadine (CLARITIN) 10 MG tablet Take 10 mg by mouth as needed for allergies.    [provider]  losartan -hydrochlorothiazide (HYZAAR) 100-12.5 MG tablet TAKE 1 TABLET BY MOUTH EVERY DAY 10/13/23   Purcell Emil Schanz, MD  mupirocin  ointment (BACTROBAN ) 2 % Apply 1 Application topically  daily. Patient taking differently: Apply 1 Application topically as needed. 09/29/23   Standiford, Marsa FALCON, DPM  omeprazole  (PRILOSEC) 40 MG capsule TAKE 1 CAPSULE (40 MG TOTAL) BY MOUTH 2 (TWO) TIMES DAILY BEFORE A MEAL. 11/10/22   Avram Lupita BRAVO, MD  OXcarbazepine  (TRILEPTAL ) 150 MG tablet TAKE 1 TABLET BY MOUTH TWICE A DAY 10/13/23   Gregg Lek, MD  rosuvastatin  (CRESTOR ) 20 MG tablet TAKE 1 TABLET BY MOUTH EVERY DAY 10/13/23   Purcell Emil Schanz, MD  Semaglutide , 2 MG/DOSE, 8 MG/3ML SOPN Inject 2 mg as directed once a week. 12/14/23   Thapa, Sudan, MD  sildenafil  (VIAGRA ) 100 MG tablet TAKE 1/2 - 1 TABLET BY MOUTH DAILY AS NEEDED FOR ERECTILE DYSFUNCTION Patient taking differently: as needed. 09/13/23   Purcell Emil Schanz, MD  silver  sulfADIAZINE  (SILVADENE ) 1 % cream Apply 1 Application topically daily. Patient not taking: Reported on 12/14/2023 07/28/23   Gershon Donnice SAUNDERS, DPM  tadalafil  (CIALIS ) 20 MG tablet Take 0.5-1 tablets (10-20 mg total) by mouth every other day as needed for erectile dysfunction. Patient not taking: Reported on 12/14/2023 03/08/22   Terrilynn Postell Jose, MD  Testosterone  Enanthate (XYOSTED ) 100 MG/0.5ML SOAJ Inject 0.5 mLs (100 mg total) into the skin once a week. 09/01/23   Briele Lagasse Jose, MD  vortioxetine  HBr (TRINTELLIX ) 10 MG TABS tablet Take 1 tablet (10 mg total) by mouth daily. 02/02/24   Kapoor, Sahil, MD    Allergies  Allergen Reactions   Amoxicillin  Anaphylaxis and Other (See Comments)    Has patient had a PCN reaction causing immediate rash, facial/tongue/throat swelling, SOB or lightheadedness with hypotension: yes Has patient had a PCN reaction causing severe rash involving mucus membranes or skin necrosis: no Has patient had a PCN reaction that required hospitalization yes Has patient had a PCN reaction occurring within the last 10 years: yes If all of the above answers are NO, then may proceed with Cephalosporin use.   Penicillins  Anaphylaxis and Other (See Comments)    Patient was told to list this as an allergy because he is allergic to Amoxicillin  Has patient had a PCN reaction causing immediate rash, facial/tongue/throat swelling, SOB or lightheadedness with hypotension: yes for Amoxicillin  Has patient had a PCN reaction causing severe rash involving mucus membranes or skin necrosis: no Has patient had a PCN reaction that required hospitalization yes for Amoxicillin  Has patient had a PCN reaction occurring within the last 10 years: yes for Amoxicillin  If all o   Metformin  And Related Other (See Comments)    SEVERE GI UPSET   Other Other (See Comments)    Powder in some gloves - localized itching but not allergic to latex, benadryl  usually helps with this reaction   Latex Itching   Zolpidem Other (See Comments)    Passed out. And memory loss    Patient  Active Problem List   Diagnosis Date Noted   GAD (generalized anxiety disorder) 10/19/2023   Hypogonadism in male 09/01/2023   Chronic depression 11/09/2022   Attention deficit hyperactivity disorder (ADHD) 09/06/2022   Seizure-like activity (HCC) 03/08/2022   Erectile dysfunction due to arterial insufficiency 03/08/2022   Bilateral swelling of feet and ankles 09/30/2021   Cervical disc disease 09/18/2020   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) 02/07/2020   Morbid obesity, unspecified obesity type (HCC) 01/10/2020   Hypertension associated with diabetes (HCC) 04/17/2018   Dyslipidemia 04/17/2018   PTSD (post-traumatic stress disorder) 08/11/2016   Spinal stenosis, lumbar region with neurogenic claudication 05/05/2016   Dysthymia 09/02/2015   Chronic low back pain 09/02/2015   Dyslipidemia associated with type 2 diabetes mellitus (HCC) 03/11/2008   UNSPECIFIED ANEMIA 03/11/2008    Past Medical History:  Diagnosis Date   Allergies    Allergy    Anemia    Anxiety    Arthritis    DDD (degenerative disc disease),  lumbar    cervical   Depression    Diabetes mellitus    Epilepsy (HCC)    GERD (gastroesophageal reflux disease)    Hyperlipidemia    Hypertension    Lumbar disc herniation    Neuromuscular disorder (HCC)    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    Pneumonia    PTSD (post-traumatic stress disorder)    Scoliosis    Sleep apnea     Past Surgical History:  Procedure Laterality Date   LUMBAR LAMINECTOMY/DECOMPRESSION MICRODISCECTOMY Left 05/05/2016   Procedure: CENTRAL DECOMPRESSION L4-L5 AND FORAMINOTOMY FOR L4 ROOT AND L5 ROOT ON THE LEFT;  Surgeon: Tanda Heading, MD;  Location: [facility];  Service: Orthopedics;  Laterality: Left;   METATARSAL HEAD EXCISION Left 09/21/2023   Procedure: EXCISION, METATARSAL BONE, HEAD and graft application;  Surgeon: Malvin Marsa FALCON, DPM;  Location: [facility];  Service: Orthopedics/Podiatry;  Laterality: Left;  3RD LEFT TOE AND BIOPSY OF 2ND toe   NO PAST SURGERIES     ulcer excision and I&D of abscess by Podiatry   04/2023   [facility]    Social History   Socioeconomic History   Marital status: Divorced    Spouse name: Not on file   Number of children: Not on file   Years of education: Not on file   Highest education level: 12th grade  Occupational History   Not on file  Tobacco Use   Smoking status: Former    Types: Cigars   Smokeless tobacco: Current    Types: Snuff   Tobacco comments:    Patient states will consider quitting in future but not interested right now.   Vaping Use   Vaping status: Never Used  Substance and Sexual Activity   Alcohol use: Not Currently    Comment: few times year   Drug use: No   Sexual activity: Yes    Partners: Female    Comment: multiple partners.  Other Topics Concern   Not on file  Social History Narrative   Lives with family   Social Drivers of Health   Financial Resource Strain: Medium Risk (03/13/2024)   Overall Financial Resource Strain (CARDIA)    Difficulty of Paying Living Expenses:  Somewhat hard  Food Insecurity: Food Insecurity Present (03/13/2024)   Hunger Vital Sign    Worried About Running Out of Food in the Last Year: Sometimes true    Ran Out of Food in the Last Year:  Often true  Transportation Needs: No Transportation Needs (03/13/2024)   PRAPARE - Administrator, Civil Service (Medical): No    Lack of Transportation (Non-Medical): No  Physical Activity: Sufficiently Active (03/13/2024)   Exercise Vital Sign    Days of Exercise per Week: 3 days    Minutes of Exercise per Session: 60 min  Stress: Stress Concern Present (03/13/2024)   [employer] of Occupational Health - Occupational Stress Questionnaire    Feeling of Stress: Very much  Social Connections: Moderately Integrated (03/13/2024)   Social Connection and Isolation Panel    Frequency of Communication with Friends and Family: More than three times a week    Frequency of Social Gatherings with Friends and Family: More than three times a week    Attends Religious Services: More than 4 times per year    Active Member of [employer] or Organizations: Yes    Attends Engineer, Structural: More than 4 times per year    Marital Status: Divorced  Intimate Partner Violence: Not At Risk (04/18/2023)   Humiliation, Afraid, Rape, and Kick questionnaire    Fear of Current or Ex-Partner: No    Emotionally Abused: No    Physically Abused: No    Sexually Abused: No    Family History  Problem Relation Age of Onset   Stroke Maternal Grandmother    Colon cancer Neg Hx    Esophageal cancer Neg Hx    Rectal cancer Neg Hx    Stomach cancer Neg Hx      Review of Systems  Constitutional: Negative.  Negative for chills and fever.  HENT: Negative.  Negative for congestion and sore throat.   Respiratory: Negative.  Negative for cough and shortness of breath.   Cardiovascular: Negative.  Negative for chest pain and palpitations.  Gastrointestinal:  Negative for abdominal pain, diarrhea, nausea and  vomiting.  Genitourinary: Negative.  Negative for dysuria and hematuria.  Skin: Negative.  Negative for rash.  Neurological: Negative.  Negative for dizziness and headaches.  All other systems reviewed and are negative.   Today's Vitals   03/14/24 1506  BP: 130/80  Pulse: 64  Temp: 98.4 F (36.9 C)  TempSrc: Oral  SpO2: 98%  Weight: 292 lb (132.5 kg)  Height: 6' 3 (1.905 m)   Body mass index is 36.5 kg/m.   Physical Exam Vitals reviewed.  Constitutional:      Appearance: Normal appearance.  HENT:     Head: Normocephalic.     Mouth/Throat:     Mouth: Mucous membranes are moist.     Pharynx: Oropharynx is clear.  Eyes:     Extraocular Movements: Extraocular movements intact.     Pupils: Pupils are equal, round, and reactive to light.  Cardiovascular:     Rate and Rhythm: Normal rate and regular rhythm.     Pulses: Normal pulses.     Heart sounds: Normal heart sounds.  Pulmonary:     Effort: Pulmonary effort is normal.     Breath sounds: Normal breath sounds.  Abdominal:     Palpations: Abdomen is soft.     Tenderness: There is no abdominal tenderness.  Musculoskeletal:     Cervical back: No tenderness.  Lymphadenopathy:     Cervical: No cervical adenopathy.  Skin:    General: Skin is warm and dry.  Neurological:     Mental Status: He is alert and oriented to person, place, and time.  Psychiatric:  Mood and Affect: Mood normal.        Behavior: Behavior normal.    Results for orders placed or performed in visit on 03/14/24 (from the past 24 hours)  POCT HgB A1C     Status: None   Collection Time: 03/14/24  3:23 PM  Result Value Ref Range   Hemoglobin A1C     HbA1c POC (<> result, manual entry) 8.4 4.0 - 5.6 %   HbA1c, POC (prediabetic range)     HbA1c, POC (controlled diabetic range)        ASSESSMENT & PLAN: A total of 44 minutes was spent with the patient and counseling/coordination of care regarding preparing for this visit, review of most  recent office visit notes, review of endocrinologist most recent office visit notes, review of multiple chronic medical conditions and their management, review of all medications, review of most recent bloodwork results including interpretation of today's hemoglobin A1c, review of health maintenance items, education on nutrition, prognosis, documentation, and need for follow up.   Problem List Items Addressed This Visit       Cardiovascular and Mediastinum   Hypertension associated with diabetes (HCC) - Primary   BP Readings from Last 3 Encounters:  03/14/24 130/80  12/14/23 128/88  12/08/23 122/84  Well-controlled hypertension Continue amlodipine  10 mg along with Hyzaar 100-12.5 mg daily Much improved diabetes with hemoglobin A1c at 8.4. Continue daily basal insulin  50 units along with Premeal insulin  10 to 15 units.  Continues weekly semaglutide  2 mg Not taking glipizide  due to hypoglycemic episodes Cardiovascular risks associated with hypertension and diabetes discussed Diet and nutrition discussed Follow-up in 3 months       Relevant Orders   Comprehensive metabolic panel with GFR   Lipid panel   Erectile dysfunction due to arterial insufficiency   Viagra  not working as well as before Will check testosterone  level today Recommend follow-up with endocrinologist regarding hypogonadism        Endocrine   Dyslipidemia associated with type 2 diabetes mellitus (HCC)   Much improved diabetes with hemoglobin A1c at 8.4 Continues insulin  and semaglutide  Continue rosuvastatin  20 mg daily Diet and nutrition discussed      Relevant Orders   Comprehensive metabolic panel with GFR   Lipid panel   Hypogonadism in male   Recommend testosterone  level today Testosterone  supplementation seems to be helping      Relevant Orders   Testosterone      Other   Morbid obesity, unspecified obesity type (HCC)   Eating better and losing weight.  Semaglutide  helping Diet and nutrition  discussed Advised to decrease amount of daily carbohydrate intake and daily calories and increase amount of plant-based protein in his diet Benefits of exercise discussed      Other Visit Diagnoses       Type 2 diabetes mellitus with hyperglycemia, without long-term current use of insulin  (HCC)       Relevant Orders   POCT HgB A1C (Completed)   Comprehensive metabolic panel with GFR   Lipid panel      Patient Instructions  Health Maintenance, Male Adopting a healthy lifestyle and getting preventive care are important in promoting health and wellness. Ask your health care provider about: The right schedule for you to have regular tests and exams. Things you can do on your own to prevent diseases and keep yourself healthy. What should I know about diet, weight, and exercise? Eat a healthy diet  Eat a diet that includes plenty of vegetables, fruits, low-fat  dairy products, and lean protein. Do not eat a lot of foods that are high in solid fats, added sugars, or sodium. Maintain a healthy weight Body mass index (BMI) is a measurement that can be used to identify possible weight problems. It estimates body fat based on height and weight. Your health care provider can help determine your BMI and help you achieve or maintain a healthy weight. Get regular exercise Get regular exercise. This is one of the most important things you can do for your health. Most adults should: Exercise for at least 150 minutes each week. The exercise should increase your heart rate and make you sweat (moderate-intensity exercise). Do strengthening exercises at least twice a week. This is in addition to the moderate-intensity exercise. Spend less time sitting. Even light physical activity can be beneficial. Watch cholesterol and blood lipids Have your blood tested for lipids and cholesterol at 41 years of age, then have this test every 5 years. You may need to have your cholesterol levels checked more often  if: Your lipid or cholesterol levels are high. You are older than 41 years of age. You are at high risk for heart disease. What should I know about cancer screening? Many types of cancers can be detected early and may often be prevented. Depending on your health history and family history, you may need to have cancer screening at various ages. This may include screening for: Colorectal cancer. Prostate cancer. Skin cancer. Lung cancer. What should I know about heart disease, diabetes, and high blood pressure? Blood pressure and heart disease High blood pressure causes heart disease and increases the risk of stroke. This is more likely to develop in people who have high blood pressure readings or are overweight. Talk with your health care provider about your target blood pressure readings. Have your blood pressure checked: Every 3-5 years if you are 83-57 years of age. Every year if you are 41 years old or older. If you are between the ages of 59 and 74 and are a current or former smoker, ask your health care provider if you should have a one-time screening for abdominal aortic aneurysm (AAA). Diabetes Have regular diabetes screenings. This checks your fasting blood sugar level. Have the screening done: Once every three years after age 66 if you are at a normal weight and have a low risk for diabetes. More often and at a younger age if you are overweight or have a high risk for diabetes. What should I know about preventing infection? Hepatitis B If you have a higher risk for hepatitis B, you should be screened for this virus. Talk with your health care provider to find out if you are at risk for hepatitis B infection. Hepatitis C Blood testing is recommended for: Everyone born from [birth year] through 1963. Anyone with known risk factors for hepatitis C. Sexually transmitted infections (STIs) You should be screened each year for STIs, including gonorrhea and chlamydia, if: You are sexually  active and are younger than 41 years of age. You are older than 40 years of age and your health care provider tells you that you are at risk for this type of infection. Your sexual activity has changed since you were last screened, and you are at increased risk for chlamydia or gonorrhea. Ask your health care provider if you are at risk. Ask your health care provider about whether you are at high risk for HIV. Your health care provider may recommend a prescription medicine to help prevent HIV  infection. If you choose to take medicine to prevent HIV, you should first get tested for HIV. You should then be tested every 3 months for as long as you are taking the medicine. Follow these instructions at home: Alcohol use Do not drink alcohol if your health care provider tells you not to drink. If you drink alcohol: Limit how much you have to 0-2 drinks a day. Know how much alcohol is in your drink. In the [place] one drink equals one 12 oz bottle of beer (355 mL), one 5 oz glass of wine (148 mL), or one 1 oz glass of hard liquor (44 mL). Lifestyle Do not use any products that contain nicotine or tobacco. These products include cigarettes, chewing tobacco, and vaping devices, such as e-cigarettes. If you need help quitting, ask your health care provider. Do not use street drugs. Do not share needles. Ask your health care provider for help if you need support or information about quitting drugs. General instructions Schedule regular health, dental, and eye exams. Stay current with your vaccines. Tell your health care provider if: You often feel depressed. You have ever been abused or do not feel safe at home. Summary Adopting a healthy lifestyle and getting preventive care are important in promoting health and wellness. Follow your health care provider's instructions about healthy diet, exercising, and getting tested or screened for diseases. Follow your health care provider's instructions on  monitoring your cholesterol and blood pressure. This information is not intended to replace advice given to you by your health care provider. Make sure you discuss any questions you have with your health care provider. Document Revised: 09/15/2020 Document Reviewed: 09/15/2020 Elsevier Patient Education  2024 Elsevier Inc.    Emil Schaumann, MD [facility]

## 2024-03-14 NOTE — Assessment & Plan Note (Signed)
 Recommend testosterone  level today Testosterone  supplementation seems to be helping

## 2024-03-14 NOTE — Assessment & Plan Note (Signed)
 Viagra  not working as well as before Will check testosterone  level today Recommend follow-up with endocrinologist regarding hypogonadism

## 2024-03-15 ENCOUNTER — Ambulatory Visit: Admitting: Endocrinology

## 2024-03-15 ENCOUNTER — Ambulatory Visit: Payer: Self-pay | Admitting: Emergency Medicine

## 2024-03-15 LAB — TESTOSTERONE: Testosterone: 333.96 ng/dL (ref 300.00–890.00)

## 2024-03-19 ENCOUNTER — Other Ambulatory Visit

## 2024-03-19 ENCOUNTER — Other Ambulatory Visit: Payer: Self-pay | Admitting: Emergency Medicine

## 2024-03-19 DIAGNOSIS — E1165 Type 2 diabetes mellitus with hyperglycemia: Secondary | ICD-10-CM

## 2024-03-19 MED ORDER — FREESTYLE LIBRE 3 PLUS SENSOR MISC: 3 refills | Status: DC

## 2024-03-19 NOTE — Progress Notes (Unsigned)
 03/19/2024 Name: [name] MRN: [id number] DOB: 08-19-1980  Chief Complaint  Patient presents with   Diabetes   Medication Management    [name] is [name] 43 y.o. year old male who presented for [name] telephone visit.   They were referred to the pharmacist by their PCP for assistance in managing diabetes.   Subjective:  Care Team: Primary Care Provider: Purcell Emil Schanz, MD ; Next Scheduled Visit: not scheduled Endocrinology initial visit 03/13/24  Medication Access/Adherence  Current Pharmacy:  [id number] [place], [place] - [address] AT [facility] [address] [place] [address] Phone: [phone] Fax: [phone]  [facility] [address] [place] [address] Phone: [phone] Fax: [phone]  [id number] - [place], [place] - [address] AT [facility] [address] [place] [address] Phone: [phone] Fax: [phone]  [facility] [name] - [place], [place] - [address] [place] [address] Phone: [phone] Fax: [phone]   Patient reports affordability concerns with their medications: No  Patient reports access/transportation concerns to their pharmacy: No  Patient reports adherence concerns with their medications:  Yes    *Pt has new insurance and notes he has been unable to get sensors through his new insurance  Diabetes:  Current medications: Ozempic  2 mg daily, Lantus  50 units daily, Novolog  15 units TID AC with CeQur Simplicity Patch  Medications tried in the past: metformin , Novolog    Current glucose readings: No CGM readings since 10/4   Patient denies hypoglycemic s/sx including dizziness, shakiness, sweating.  Patient denies hyperglycemic symptoms including polyuria, polydipsia, polyphagia, nocturia, neuropathy, blurred  vision.   Current medication access support: Medicaid  Diet: Water/Pepsi Zero, Orange juice and grape fruit juice [name] few times week - notes he has cut back on juice/soda Mostly eating at home  Breakfast: sausage, egg, biscuit, bacon Lunch: not usually eating Dinner:   *He drives [name] lot and eats on the go or tends to snack more while working. He has continued working on reducing soda/juice.   Objective:  Lab Results  Component Value Date   HGBA1C 8.4 03/14/2024    Lab Results  Component Value Date   CREATININE 0.98 03/14/2024   BUN 12 03/14/2024   NA 139 03/14/2024   K 3.1 (L) 03/14/2024   CL 99 03/14/2024   CO2 32 03/14/2024    Lab Results  Component Value Date   CHOL 116 03/14/2024   HDL 33.90 (L) 03/14/2024   LDLCALC 47 03/14/2024   LDLDIRECT 46.0 11/09/2022   TRIG 178.0 (H) 03/14/2024   CHOLHDL 3 03/14/2024    Medications Reviewed Today     Reviewed by [name], RPH (Pharmacist) on 03/19/24 at 1615  Med List Status: <None>   Medication Order Taking? Sig Documenting Provider Last Dose Status Informant  acetaminophen  (TYLENOL ) 500 MG tablet [id number]  Take 1,000 mg by mouth every 6 (six) hours as needed for mild pain. [provider]  Active Self  amLODipine  (NORVASC ) 10 MG tablet [id number]  TAKE 1 TABLET BY MOUTH EVERY DAY [name], Emil Schanz, MD  Active   ARIPiprazole  (ABILIFY ) 5 MG tablet [id number]  Take 1 tablet (5 mg total) by mouth daily. [name], Sahil, MD  Active   atomoxetine  (STRATTERA ) 40 MG capsule [id number]  Take 1 capsule (40 mg total) by mouth daily. Needs appt with behavioral  health for refill. Gerald Emil Schanz, MD  Active   blood glucose meter kit and supplies [id number]  Dispense based on patient and insurance preference. Use up to four times daily as directed. (FOR ICD-10 E10.9, E11.9). Gerald Toribio GAILS, MD  Active            Med Note [name]   Tue Jun 10, 2020  9:14 AM) sometimes  Continuous Glucose Sensor  (FREESTYLE LIBRE 3 PLUS SENSOR) [name] [id number]  Change sensor every 15 days.  Patient not taking: Reported on 03/19/2024   [name], Gerald Jose, MD  Active   cyclobenzaprine  (FLEXERIL ) 10 MG tablet [id number]  TAKE 1 TABLET BY MOUTH THREE TIMES [name] DAY AS NEEDED FOR MUSCLE SPASM Gerald Emil Schanz, MD  Active   gabapentin  (NEURONTIN ) 300 MG capsule [id number]  TAKE 1 CAPSULE BY MOUTH THREE TIMES [name] DAY [name], Emil Schanz, MD  Active   hydrOXYzine  (ATARAX ) 25 MG tablet [id number]  Take 1 tablet (25 mg total) by mouth 2 (two) times daily as needed for anxiety. [name], Sahil, MD  Active   injection device for insulin  [name] [employer] SIMPLICITY 2U) DEVI [id number] Yes 1 each by Other route every 3 (three) days. Use up to 7 click ( give 14 units) before meals 3 times [name] day. [name], Sudan, MD  Active   Injection Device for Insulin  (CEQUR SIMPLICITY INSERTER) MISC [id number]  Use as advised [name], Sudan, MD  Active   insulin  aspart (NOVOLOG ) 100 UNIT/ML injection [id number] Yes Inject 15 Units into the skin 3 (three) times daily before meals. [name], Sudan, MD  Active   insulin  glargine (LANTUS  SOLOSTAR) 100 UNIT/ML Solostar Pen [id number] Yes Inject 50 Units into the skin daily. [name], Sudan, MD  Active   Insulin  Pen Needle 32G X 4 MM MISC [id number]  Use 4x [name] day [name], Sudan, MD  Active   ketoconazole  (NIZORAL ) 2 % cream [id number]  Apply 1 Application topically daily.  Patient taking differently: Apply 1 Application topically daily as needed.   [name], DPM  Active   loratadine (CLARITIN) 10 MG tablet [id number]  Take 10 mg by mouth as needed for allergies. [provider]  Active   losartan -hydrochlorothiazide (HYZAAR) 100-12.5 MG tablet [id number]  TAKE 1 TABLET BY MOUTH EVERY DAY Gerald Emil Schanz, MD  Active   mupirocin  ointment (BACTROBAN ) 2 % [id number]  Apply 1 Application topically daily.  Patient taking differently: Apply 1 Application topically as needed.   [name], DPM   Active   omeprazole  (PRILOSEC) 40 MG capsule [id number]  TAKE 1 CAPSULE (40 MG TOTAL) BY MOUTH 2 (TWO) TIMES DAILY BEFORE [name] MEAL. Avram Lupita BRAVO, MD  Active   OXcarbazepine  (TRILEPTAL ) 150 MG tablet [id number]  TAKE 1 TABLET BY MOUTH TWICE [name] DAY Camara, Amadou, MD  Active   rosuvastatin  (CRESTOR ) 20 MG tablet [id number]  TAKE 1 TABLET BY MOUTH EVERY DAY [name], Gerald Jose, MD  Active   Semaglutide , 2 MG/DOSE, 8 MG/3ML SOPN [id number] Yes Inject 2 mg as directed once [name] week. [name], Sudan, MD  Active   sildenafil  (VIAGRA ) 100 MG tablet [id number]  TAKE 1/2 - 1 TABLET BY MOUTH DAILY AS NEEDED FOR ERECTILE DYSFUNCTION  Patient taking differently: as needed.   [name], Gerald Jose, MD  Active   silver  sulfADIAZINE  (SILVADENE ) 1 % cream [id number]  Apply 1 Application topically daily.  Patient not taking: Reported on 03/14/2024   Gershon Donnice [name], DPM  Active   tadalafil  (CIALIS ) 20 MG tablet  [id number]  Take 0.5-1 tablets (10-20 mg total) by mouth every other day as needed for erectile dysfunction.  Patient not taking: Reported on 03/14/2024   [name], Gerald Jose, MD  Active   Testosterone  Enanthate (XYOSTED ) 100 MG/0.5ML EMMANUEL [id number]  Inject 0.5 mLs (100 mg total) into the skin once [name] week. [name], Gerald Jose, MD  Active   vortioxetine  HBr (TRINTELLIX ) 10 MG TABS tablet [id number]  Take 1 tablet (10 mg total) by mouth daily. [name], Sahil, MD  Active             Assessment/Plan:   Diabetes: - Currently uncontrolled, A1c goal <7% - do not have updated CGM data. Recent A1c was improved but above goal. - Discussed dietary modifications: reduce juice consumption, increase protein/fiber, encouraged balanced meals - Continue ozempic  2 mg weekly, Lantus  50 units daily, and Novolog  15 units TID AC using CeQur patch - Submitted PA for [employer] 3 Plus sensors - approved. Sent Rx to pharmacy and notified patient to contact the pharmacy to fill the rx.  - Consider Jardiance  if BG remains  borderline   Follow Up Plan: 11/24  Darrelyn Drum, PharmD, BCPS, CPP Clinical Pharmacist Practitioner [facility] [phone]

## 2024-03-19 NOTE — Telephone Encounter (Signed)
 Pharmacy Patient Advocate Encounter  Received notification from [facility] MEDICAID that Prior Authorization for Ozempic  (2 MG/DOSE) 8MG /3ML pen-injectors  has been APPROVED from 03-14-2024 to 03-14-2025   PA #/Case ID/Reference #: [id number]

## 2024-03-20 NOTE — Progress Notes (Signed)
 Complex Care Management Note  Care Guide Note 03/20/2024 Name: MICHELE KERLIN MRN: [id number] DOB: 05/01/1981  HARUN BRUMLEY is a 43 y.o. year old male who sees Sagardia, Emil Schanz, MD for primary care. I reached out to Hershal CHRISTELLA Pottinger by phone today to offer complex care management services.  Mr. Quiroa was given information about Complex Care Management services today including:   The Complex Care Management services include support from the care team which includes your Nurse Care Manager, Clinical Social Worker, or Pharmacist.  The Complex Care Management team is here to help remove barriers to the health concerns and goals most important to you. Complex Care Management services are voluntary, and the patient may decline or stop services at any time by request to their care team member.   Complex Care Management Consent Status: Patient did not agree to participate in complex care management services at this time.  Follow up plan:    Encounter Outcome:  Patient Refused  Jeoffrey [place] , RMA     [facility] Guide  Direct Dial : [phone]  Website: [email or web address]

## 2024-03-22 ENCOUNTER — Ambulatory Visit: Admitting: Neurology

## 2024-03-22 ENCOUNTER — Encounter (HOSPITAL_COMMUNITY): Payer: MEDICAID

## 2024-03-23 ENCOUNTER — Other Ambulatory Visit (HOSPITAL_COMMUNITY): Payer: Self-pay | Admitting: Physician Assistant

## 2024-03-23 DIAGNOSIS — F333 Major depressive disorder, recurrent, severe with psychotic symptoms: Secondary | ICD-10-CM

## 2024-03-23 DIAGNOSIS — F411 Generalized anxiety disorder: Secondary | ICD-10-CM

## 2024-03-30 ENCOUNTER — Other Ambulatory Visit: Payer: Self-pay | Admitting: Emergency Medicine

## 2024-03-30 DIAGNOSIS — M5412 Radiculopathy, cervical region: Secondary | ICD-10-CM

## 2024-03-30 DIAGNOSIS — M5090 Cervical disc disorder, unspecified, unspecified cervical region: Secondary | ICD-10-CM

## 2024-04-02 ENCOUNTER — Telehealth: Payer: Self-pay

## 2024-04-02 ENCOUNTER — Other Ambulatory Visit: Payer: MEDICAID

## 2024-04-02 NOTE — Telephone Encounter (Signed)
 Copied from CRM #[id number]. Topic: General - Call Back - No Documentation >> Apr 02, 2024  2:44 PM Ashley R wrote: Reason for CRM: Call with pharmacist dropped. Calling back to continue conversation on [phone] - call dropped.

## 2024-04-11 ENCOUNTER — Ambulatory Visit (HOSPITAL_COMMUNITY): Payer: MEDICAID | Admitting: Licensed Clinical Social Worker

## 2024-04-12 ENCOUNTER — Encounter: Payer: Self-pay | Admitting: Endocrinology

## 2024-04-12 ENCOUNTER — Ambulatory Visit: Payer: MEDICAID | Admitting: Endocrinology

## 2024-04-12 DIAGNOSIS — Z794 Long term (current) use of insulin: Secondary | ICD-10-CM | POA: Diagnosis not present

## 2024-04-12 DIAGNOSIS — E1169 Type 2 diabetes mellitus with other specified complication: Secondary | ICD-10-CM

## 2024-04-12 DIAGNOSIS — E1165 Type 2 diabetes mellitus with hyperglycemia: Secondary | ICD-10-CM | POA: Diagnosis not present

## 2024-04-12 MED ORDER — CEQUR SIMPLICITY 2U DEVI: 1.0000 | 3 refills | Status: AC

## 2024-04-12 MED ORDER — LANTUS SOLOSTAR 100 UNIT/ML ~~LOC~~ SOPN: 30.0000 [IU] | PEN_INJECTOR | Freq: Every day | SUBCUTANEOUS | 4 refills | Status: AC

## 2024-04-12 MED ORDER — INSULIN ASPART 100 UNIT/ML IJ SOLN: 16.0000 [IU] | Freq: Three times a day (TID) | INTRAMUSCULAR | 3 refills | Status: AC

## 2024-04-12 MED ORDER — SEMAGLUTIDE (2 MG/DOSE) 8 MG/3ML ~~LOC~~ SOPN: 2.0000 mg | PEN_INJECTOR | SUBCUTANEOUS | 3 refills | Status: AC

## 2024-04-12 MED ORDER — FREESTYLE LIBRE 3 PLUS SENSOR MISC: 3 refills | Status: AC

## 2024-04-12 NOTE — Progress Notes (Signed)
 Outpatient Endocrinology Note Milianna Ericsson, MD   Patient's Name: [name]    DOB: December 15, 1980    MRN: [id number]                                                    REASON OF VISIT: Follow-up for type 2 diabetes mellitus  REFERRING PROVIDER: Purcell Emil Schanz, MD  PCP: Purcell Emil Schanz, MD  HISTORY OF PRESENT ILLNESS:   [name] is a 43 y.o. old male with past medical history listed below, is here for follow-up for type 2 diabetes mellitus.   Pertinent Diabetes History: Patient was diagnosed with type 2 diabetes mellitus in 2008.  He was seen in this clinic in 2017 timeframe, after that he lost follow-up with endocrinology, was following with primary care provider, lately has been seeing pharmacist as well.  Patient referred to endocrinology for evaluation and management of uncontrolled type 2 diabetes mellitus and initial visit on August 6 of 2025.  He has uncontrolled type 2 diabetes mellitus with hemoglobin A1c in the range of 10-12%.  Diabetes control and compliance had been challenging in the past due to mental health problems /depression as well.  History of DKA or diabetes related hospitalizations: none  Previous diabetes education: no ?  No personal history of pancreatitis and / or family history of medullary thyroid carcinoma or MEN 2B syndrome.   Chronic Diabetes Complications : Retinopathy: no reportedly. Last ophthalmology exam was done on annually, following with ophthalmology regularly.  Nephropathy: no, on ACE/ARB /losartan  Peripheral neuropathy: ? no Coronary artery disease: no Stroke: no  Relevant comorbidities and cardiovascular risk factors: Obesity: yes Body mass index is 38.62 kg/m.  Hypertension: Yes  Hyperlipidemia : Yes, on statin   Current / Home Diabetic regimen includes:  Lantus  15 units daily at bedtime.  NovoLog  using Cequr device 16 units with meals 3 times a day.  Ozempic  2 mg weekly.  Prior diabetic  medications: Metformin  in the past he stopped due to GI intolerance.  He had taken Invokana  in the past.  He had taken Victoza  in the past.  He had taken Trulicity , was switched to Ozempic .  Glipizide  stopped due to hypoglycemia.  Glycemic data:    CONTINUOUS GLUCOSE MONITORING SYSTEM (CGMS) INTERPRETATION: At today's visit, we reviewed CGM downloads. The full report is scanned in the media. Reviewing the CGM trends, blood glucose are as follows:  FreeStyle Libre 3+ CGM-  Sensor Download (Sensor download was reviewed and summarized below.) Dates: November 21 to December 4 , 2025, 14 days Sensor Average: 227  Glucose Management Indicator: 8.6% % data captured: 97%    Previous:    Interpretation: He has frequent hyperglycemia mostly with blood sugar in the range of 280-350 range postprandially related to meals.  Mild hyperglycemia in between the meals and overnight.  Some of the time blood sugar in the range of 120-140 range in between the meals.  No hypoglycemia.  Hypoglycemia: Patient has no hypoglycemic episodes. Patient has hypoglycemia awareness  Factors modifying glucose control: 1.  Diabetic diet assessment: 2-3 meals a day.  He sometimes eats bedtime snack.  Cut down on soda significantly.  2.  Staying active or exercising: Active at work.  3.  Medication compliance: compliant all of the time.  # Hypogonadism : Currently on testosterone  therapy.  No detail  workup of hypogonadism available to review of the chart.  He has erectile dysfunction and has been using sildenafil .  Managed by primary care provider.   Interval history  Hemoglobin A1c was 8.4% last performed in November 5, was improving.  CGM data as reviewed above.  He still with frequent hyperglycemia postprandially.  He has actually been using Lantus  15 units daily instead of recommendation of 50 units daily in the last visit.  Prior to last visit he was using Lantus  40 units 2 times a day.  He has been using  Cequr device for mealtime insulin  NovoLog .  He stopped taking glipizide  in between the visits in consultation with pharmacy due to hypoglycemia.  He has been taking Ozempic  2 mg weekly and tolerating well, denies any GI issues.  He has a question about testosterone  level which was 333 in the late afternoon last month and is appropriate.  He has been taking testosterone  injection every week, currently managed by primary care provider.  No other complaints today.  REVIEW OF SYSTEMS As per history of present illness.   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    Pneumonia    PTSD (post-traumatic stress disorder)    Scoliosis    Sleep apnea     PAST SURGICAL HISTORY: Past Surgical History:  Procedure Laterality Date   LUMBAR LAMINECTOMY/DECOMPRESSION MICRODISCECTOMY Left 05/05/2016   Procedure: CENTRAL DECOMPRESSION L4-L5 AND FORAMINOTOMY FOR L4 ROOT AND L5 ROOT ON THE LEFT;  Surgeon: Tanda Heading, MD;  Location: [facility];  Service: Orthopedics;  Laterality: Left;   METATARSAL HEAD EXCISION Left 09/21/2023   Procedure: EXCISION, METATARSAL BONE, HEAD and graft application;  Surgeon: Malvin Marsa FALCON, DPM;  Location: [facility];  Service: Orthopedics/Podiatry;  Laterality: Left;  3RD LEFT TOE AND BIOPSY OF 2ND toe   NO PAST SURGERIES     ulcer excision and I&D of abscess by Podiatry   04/2023   [facility]    ALLERGIES: Allergies  Allergen Reactions   Amoxicillin  Anaphylaxis and Other (See Comments)    Has patient had a PCN reaction causing immediate rash, facial/tongue/throat swelling, SOB or lightheadedness with  hypotension: yes Has patient had a PCN reaction causing severe rash involving mucus membranes or skin necrosis: no Has patient had a PCN reaction that required hospitalization yes Has patient had a PCN reaction occurring within the last 10 years: yes If all of the above answers are NO, then may proceed with Cephalosporin use.   Penicillins Anaphylaxis and Other (See Comments)    Patient was told to list this as an allergy because he is allergic to Amoxicillin  Has patient had a PCN reaction causing immediate rash, facial/tongue/throat swelling, SOB or lightheadedness with hypotension: yes for Amoxicillin  Has patient had a PCN reaction causing severe rash involving mucus membranes or skin necrosis: no Has patient had a PCN reaction that required hospitalization yes for Amoxicillin  Has patient had a PCN reaction occurring within the last 10 years: yes for Amoxicillin  If all o   Metformin  And Related Other (See  Comments)    SEVERE GI UPSET   Other Other (See Comments)    Powder in some gloves - localized itching but not allergic to latex, benadryl  usually helps with this reaction   Latex Itching   Zolpidem Other (See Comments)    Passed out. And memory loss    FAMILY HISTORY:  Family History  Problem Relation Age of Onset   Stroke Maternal Grandmother    Colon cancer Neg Hx    Esophageal cancer Neg Hx    Rectal cancer Neg Hx    Stomach cancer Neg Hx     SOCIAL HISTORY: Social History   Socioeconomic History   Marital status: Divorced    Spouse name: Not on file   Number of children: Not on file   Years of education: Not on file   Highest education level: 12th grade  Occupational History   Not on file  Tobacco Use   Smoking status: Former    Types: Cigars   Smokeless tobacco: Current    Types: Snuff   Tobacco comments:    Patient states will consider quitting in future but not interested right now.   Vaping Use   Vaping status: Never Used  Substance and Sexual Activity    Alcohol use: Not Currently    Comment: few times year   Drug use: No   Sexual activity: Yes    Partners: Female    Comment: multiple partners.  Other Topics Concern   Not on file  Social History Narrative   Lives with family   Social Drivers of Health   Financial Resource Strain: Medium Risk (03/13/2024)   Overall Financial Resource Strain (CARDIA)    Difficulty of Paying Living Expenses: Somewhat hard  Food Insecurity: Food Insecurity Present (03/13/2024)   Hunger Vital Sign    Worried About Running Out of Food in the Last Year: Sometimes true    Ran Out of Food in the Last Year: Often true  Transportation Needs: No Transportation Needs (03/13/2024)   PRAPARE - Administrator, Civil Service (Medical): No    Lack of Transportation (Non-Medical): No  Physical Activity: Sufficiently Active (03/13/2024)   Exercise Vital Sign    Days of Exercise per Week: 3 days    Minutes of Exercise per Session: 60 min  Stress: Stress Concern Present (03/13/2024)   [employer] of Occupational Health - Occupational Stress Questionnaire    Feeling of Stress: Very much  Social Connections: Moderately Integrated (03/13/2024)   Social Connection and Isolation Panel    Frequency of Communication with Friends and Family: More than three times a week    Frequency of Social Gatherings with Friends and Family: More than three times a week    Attends Religious Services: More than 4 times per year    Active Member of [employer] or Organizations: Yes    Attends Engineer, Structural: More than 4 times per year    Marital Status: Divorced    MEDICATIONS:  Current Outpatient Medications  Medication Sig Dispense Refill   acetaminophen  (TYLENOL ) 500 MG tablet Take 1,000 mg by mouth every 6 (six) hours as needed for mild pain.     amLODipine  (NORVASC ) 10 MG tablet TAKE 1 TABLET BY MOUTH EVERY DAY 90 tablet 3   ARIPiprazole  (ABILIFY ) 5 MG tablet Take 1 tablet (5 mg total) by mouth daily. 30  tablet 1   atomoxetine  (STRATTERA ) 40 MG capsule Take 1 capsule (40 mg total) by mouth daily. Needs appt with  behavioral health for refill. 30 capsule 0   blood glucose meter kit and supplies Dispense based on patient and insurance preference. Use up to four times daily as directed. (FOR ICD-10 E10.9, E11.9). 1 each 0   Continuous Glucose Sensor (FREESTYLE LIBRE 3 PLUS SENSOR) MISC Change sensor every 15 days. 6 each 3   cyclobenzaprine  (FLEXERIL ) 10 MG tablet TAKE 1 TABLET BY MOUTH THREE TIMES A DAY AS NEEDED FOR MUSCLE SPASM 90 tablet 1   gabapentin  (NEURONTIN ) 300 MG capsule TAKE 1 CAPSULE BY MOUTH THREE TIMES A DAY 270 capsule 0   hydrOXYzine  (ATARAX ) 25 MG tablet Take 1 tablet (25 mg total) by mouth 2 (two) times daily as needed for anxiety. 60 tablet 1   injection device for insulin  (CEQUR SIMPLICITY 2U) DEVI 1 each by Other route every 3 (three) days. Use up to 8 click ( give 14 units) before meals 3 times a day. 30 each 3   Injection Device for Insulin  (CEQUR SIMPLICITY INSERTER) MISC Use as advised 1 each 0   insulin  aspart (NOVOLOG ) 100 UNIT/ML injection Inject 16 Units into the skin 3 (three) times daily before meals. 50 mL 3   insulin  glargine (LANTUS  SOLOSTAR) 100 UNIT/ML Solostar Pen Inject 30 Units into the skin daily. 30 mL 4   Insulin  Pen Needle 32G X 4 MM MISC Use 4x a day 300 each 3   ketoconazole  (NIZORAL ) 2 % cream Apply 1 Application topically daily. (Patient taking differently: Apply 1 Application topically daily as needed.) 60 g 2   loratadine (CLARITIN) 10 MG tablet Take 10 mg by mouth as needed for allergies.     losartan -hydrochlorothiazide (HYZAAR) 100-12.5 MG tablet TAKE 1 TABLET BY MOUTH EVERY DAY 90 tablet 3   mupirocin  ointment (BACTROBAN ) 2 % Apply 1 Application topically daily. (Patient taking differently: Apply 1 Application topically as needed.) 22 g 0   omeprazole  (PRILOSEC) 40 MG capsule TAKE 1 CAPSULE (40 MG TOTAL) BY MOUTH 2 (TWO) TIMES DAILY BEFORE A MEAL. 180  capsule 0   OXcarbazepine  (TRILEPTAL ) 150 MG tablet TAKE 1 TABLET BY MOUTH TWICE A DAY 180 tablet 1   rosuvastatin  (CRESTOR ) 20 MG tablet TAKE 1 TABLET BY MOUTH EVERY DAY 90 tablet 3   Semaglutide , 2 MG/DOSE, 8 MG/3ML SOPN Inject 2 mg as directed once a week. 9 mL 3   sildenafil  (VIAGRA ) 100 MG tablet TAKE 1/2 - 1 TABLET BY MOUTH DAILY AS NEEDED FOR ERECTILE DYSFUNCTION (Patient taking differently: as needed.) 5 tablet 11   silver  sulfADIAZINE  (SILVADENE ) 1 % cream Apply 1 Application topically daily. (Patient not taking: Reported on 03/14/2024) 50 g 0   tadalafil  (CIALIS ) 20 MG tablet Take 0.5-1 tablets (10-20 mg total) by mouth every other day as needed for erectile dysfunction. (Patient not taking: Reported on 03/14/2024) 10 tablet 11   Testosterone  Enanthate (XYOSTED ) 100 MG/0.5ML SOAJ Inject 0.5 mLs (100 mg total) into the skin once a week. 1.96 mL 5   vortioxetine  HBr (TRINTELLIX ) 10 MG TABS tablet Take 1 tablet (10 mg total) by mouth daily. 30 tablet 1   No current facility-administered medications for this visit.    PHYSICAL EXAM: Vitals:   04/12/24 1517  BP: 120/82  Weight: (!) 309 lb (140.2 kg)  Height: 6' 3 (1.905 m)   Body mass index is 38.62 kg/m.  Wt Readings from Last 3 Encounters:  04/12/24 (!) 309 lb (140.2 kg)  03/14/24 292 lb (132.5 kg)  12/14/23 (!) 306 lb 12.8 oz (139.2 kg)  General: Well developed, well nourished male in no apparent distress.  HEENT: AT/[place], no external lesions.  Eyes: Conjunctiva clear and no icterus. Neck: Neck supple  Lungs: Respirations not labored Neurologic: Alert, oriented, normal speech Extremities / Skin: Dry.  Psychiatric: Does not appear depressed or anxious  Diabetic Foot Exam - Simple   No data filed     LABS Reviewed Lab Results  Component Value Date   HGBA1C 8.4 03/14/2024   HGBA1C 10.4 (A) 12/08/2023   HGBA1C 12.4 (A) 07/11/2023   Lab Results  Component Value Date   FRUCTOSAMINE 254 03/11/2016   Lab Results   Component Value Date   CHOL 116 03/14/2024   HDL 33.90 (L) 03/14/2024   LDLCALC 47 03/14/2024   LDLDIRECT 46.0 11/09/2022   TRIG 178.0 (H) 03/14/2024   CHOLHDL 3 03/14/2024   Lab Results  Component Value Date   MICRALBCREAT 16.9 07/11/2023   MICRALBCREAT 4.5 04/26/2008   Lab Results  Component Value Date   CREATININE 0.98 03/14/2024   Lab Results  Component Value Date   GFR 95.81 03/14/2024    ASSESSMENT / PLAN  1. Uncontrolled type 2 diabetes mellitus with hyperglycemia (HCC)   2. Type 2 diabetes mellitus with hyperglycemia, without long-term current use of insulin  (HCC)   3. Type 2 diabetes mellitus with hyperglycemia, with long-term current use of insulin  (HCC)     Diabetes Mellitus type 2, complicated by no other known complication. - Diabetic status / severity: Uncontrolled, improving  Lab Results  Component Value Date   HGBA1C 8.4 03/14/2024    - Hemoglobin A1c goal : <6.5%  In last 1 year patient has significant improvement in diabetes control with hemoglobin A1c from 12 percent to 8.4% by November 2025.  Discussed that diabetes control is still not optimal.  Adjusted diabetes regimen as follows.  In the last visit there was plan for Lantus  to be 50 units daily however patient has been taking 15 units daily.  - Medications: See below, adjusted diabetes regimen as follows.  I) increase Lantus  from 15 to 30 units daily.   II) continue NovoLog  16 units with meals 3 times a day.  Using  Cequr insulin  delivery device. III) continue Ozempic  2 mg weekly.  - Home glucose testing: CGM Freestyle libre 3+.  Check as needed.  - Discussed/ Gave Hypoglycemia treatment plan.  # Consult : Referred to diabetic educator/dietitian in prior visit.   # Annual urine for microalbuminuria/ creatinine ratio, no microalbuminuria currently, continue ACE/ARB /losartan . Last  Lab Results  Component Value Date   MICRALBCREAT 16.9 07/11/2023    # Foot check nightly.  #  Annual dilated diabetic eye exams.   - Diet: Make healthy diabetic food choices, discussed about limiting carbohydrates.  Discussed about avoiding regular sodas. - Life style / activity / exercise: Discussed.  2. Blood pressure  -  BP Readings from Last 1 Encounters:  04/12/24 120/82    - Control is in target.  - No change in current plans.  3. Lipid status / Hyperlipidemia - Last  Lab Results  Component Value Date   LDLCALC 47 03/14/2024   - Continue rosuvastatin  20 mg daily.  Managed by PCP.  [place] was seen today for diabetes.  Diagnoses and all orders for this visit:  Uncontrolled type 2 diabetes mellitus with hyperglycemia (HCC) -     insulin  glargine (LANTUS  SOLOSTAR) 100 UNIT/ML Solostar Pen; Inject 30 Units into the skin daily. -     injection device for insulin  (  CEQUR SIMPLICITY 2U) DEVI; 1 each by Other route every 3 (three) days. Use up to 8 click ( give 14 units) before meals 3 times a day. -     insulin  aspart (NOVOLOG ) 100 UNIT/ML injection; Inject 16 Units into the skin 3 (three) times daily before meals.  Type 2 diabetes mellitus with hyperglycemia, without long-term current use of insulin  (HCC) -     Continuous Glucose Sensor (FREESTYLE LIBRE 3 PLUS SENSOR) MISC; Change sensor every 15 days.  Type 2 diabetes mellitus with hyperglycemia, with long-term current use of insulin  (HCC) -     Semaglutide , 2 MG/DOSE, 8 MG/3ML SOPN; Inject 2 mg as directed once a week.   DISPOSITION Follow up in clinic in [place] suggested.  Patient has plan to move to new work beginning of next month, he is going to find provider after his move.  Patient is encouraged to call our clinic with any questions regarding diabetes mellitus.   All questions answered and patient verbalized understanding of the plan.  Hikari Tripp, MD [facility] Endocrinology [facility] [address] [place], [address] [place], [place] [address] Phone # [phone]  At least part of this note was  generated using voice recognition software. Inadvertent word errors may have occurred, which were not recognized during the proofreading process.

## 2024-04-12 NOTE — Patient Instructions (Signed)
 Diabetes regimen:  Lantus  30 units daily. Novolog  16 units with meals three times a day, take 15 minutes before eating. Ozempic  2 mg weekly.

## 2024-04-13 ENCOUNTER — Encounter: Payer: Self-pay | Admitting: Endocrinology

## 2024-04-16 ENCOUNTER — Ambulatory Visit (INDEPENDENT_AMBULATORY_CARE_PROVIDER_SITE_OTHER): Payer: MEDICAID | Admitting: Licensed Clinical Social Worker

## 2024-04-16 DIAGNOSIS — F411 Generalized anxiety disorder: Secondary | ICD-10-CM

## 2024-04-16 DIAGNOSIS — F4310 Post-traumatic stress disorder, unspecified: Secondary | ICD-10-CM

## 2024-04-16 NOTE — Progress Notes (Addendum)
 THERAPIST PROGRESS NOTE   Virtual Visit via Video Note  I connected with [name] on 04/16/24 at  4:00 PM EST by a video enabled telemedicine application and verified that I am speaking with the correct person using two identifiers.  Location: Patient: [name]  Provider: Providers Home office    I discussed the limitations of evaluation and management by telemedicine and the availability of in person appointments. The patient expressed understanding and agreed to proceed.   I discussed the assessment and treatment plan with the patient. The patient was provided an opportunity to ask questions and all were answered. The patient agreed with the plan and demonstrated an understanding of the instructions.   The patient was advised to call back or seek an in-person evaluation if the symptoms worsen or if the condition fails to improve as anticipated.  I provided 30 minutes of non-face-to-face time during this encounter.   [name], LCSW   Participation Level: Active  Behavioral Response: CasualAlertEuthymic  Type of Therapy: Individual Therapy  Treatment Goals addressed:  Resolved     BH CCP Acute or Chronic Trauma Reaction     LTG: Recall traumatic events without becoming overwhelmed with negative emotions (Completed/Met)     Start:  10/19/23    Expected End:  05/11/24    Resolved:  03/13/24      STG: Gerald will complete at least 80% of assigned homework  (Completed/Met)     Start:  10/19/23    Expected End:  05/11/24    Resolved:  03/13/24      STG: Gerald will cooperate with and complete psychological testing to help evaluate trauma symptoms (Completed/Met)     Start:  10/19/23    Expected End:  05/11/24    Resolved:  03/13/24      STG: Gerald will describe the signs and symptoms of PTSD that are experienced and how they interfere with daily living (Completed/Met)     Start:  10/19/23    Expected End:  05/11/24    Resolved:  04/16/24       STG: Gerald will identify internal and external stimuli that trigger PTSD symptoms (Completed/Met)     Start:  10/19/23    Expected End:  05/11/24    Resolved:  04/16/24      STG: [name] will acknowledge that healing from PTSD is a gradual process (Completed/Met)     Start:  10/19/23    Expected End:  05/11/24    Resolved:  04/16/24      STG: [name] will identify coping strategies to deal with trauma memories and the associated emotional reaction (Completed/Met)     Start:  10/19/23    Expected End:  05/11/24    Resolved:  04/16/24      STG: Gerald will cooperate with a medication evaluation by accurately reporting symptoms, if present (Completed/Met)     Start:  10/19/23    Expected End:  05/11/24    Resolved:  04/16/24      Work with Gerald to track symptoms, triggers, and/or skill use through a mood chart, diary card, or journal (Completed)     Start:  10/19/23    End:  04/16/24      Encourage Gerald to participate in recovery peer support activities  (Completed)     Start:  10/19/23    End:  04/16/24      Glorious Gerald to take psychotropic medication as prescribed (Completed)     Start:  10/19/23  End:  03/13/24      Glorious [name] to communicate effects of prescribed medications (Completed)     Start:  10/19/23    End:  03/13/24      Provide [name] with education on trauma-oriented therapy (Completed)     Start:  10/19/23    End:  03/13/24      Provide and outline the treatment process to [name], explaining that it will include a gradual processing of the details and feelings associated with the trauma and developing new, more appropriate coping strategies (Completed)     Start:  10/19/23    End:  03/13/24      Cooperate with trauma-focused psychotherapy techniques to reduce emotional reaction to the traumatic event  (Completed)     Start:  10/19/23    End:  03/13/24      Refer [name] to a psychiatrist for a consultation regarding  medication management of symptoms  (Completed)     Start:  10/19/23    End:  03/13/24      Assess [name]'s substance use pattern; refer or treat the victim for chemical dependence if indicated  (Completed)     Start:  10/19/23    End:  03/13/24        OP Depression     LTG: Reduce frequency, intensity, and duration of depression symptoms so that daily functioning is improved (Completed/Met)     Start:  10/19/23    Expected End:  05/11/24    Resolved:  04/16/24      LTG: Increase coping skills to manage depression and improve ability to perform daily activities (Completed/Met)     Start:  10/19/23    Expected End:  05/11/24    Resolved:  03/13/24      STG: [name] will attend at least 80% of scheduled family sessions  (Not Applicable)     Start:  10/19/23    Expected End:  05/11/24    Resolved:  12/13/23      Work with [name] to track symptoms, triggers, and/or skill use through a mood chart, diary card, or journal (Completed)     Start:  10/19/23    End:  04/16/24      Encourage [name] to participate in recovery peer support activities weekly  (Completed)     Start:  10/19/23    End:  12/13/23      Provide [name] educational information and reading material on dissociation, its causes, and symptoms (Completed)     Start:  10/19/23    End:  12/13/23      Work with [name] to identify the major components of a recent episode of depression: physical symptoms, major thoughts and images, and major behaviors they experienced (Completed)     Start:  10/19/23    End:  12/13/23      Therapist will educate patient on cognitive distortions and the rationale for treatment of depression (Completed)     Start:  10/19/23    End:  12/13/23         ProgressTowards Goals: Progressing  Interventions: CBT and Supportive  Summary: [name] is a 43 y.o. male who presents with   Suicidal/Homicidal: Nowithout intent/plan  Therapist Response:    S:  Subjective Patient arrived alert and oriented 5. He was pleasant, cooperative, maintained good eye contact, and engaged well throughout the therapeutic session. He reports that things have been going well and states he is ready to move to [place] .  Patient discussed plans for  transferring care and obtaining a 90-day supply of medications. He reports he will temporarily hold off on establishing therapeutic services in [place]  until he knows what his insurance coverage will look like in the new state.  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 This is at the same hospital where the woman he is dating works, with whom he plans to move in.  Patient reports an overall decrease in depression and anxiety symptoms, with the exception of increased tension and worry related to telling his parents that he plans to move out of their home--and take his children--to [place] . He has not yet informed them because he wants to secure employment first to address their concerns proactively.  Patient denies suicidal ideation and denies homicidal ideation.  O: Objective  Patient alert and oriented 5  Pleasant, cooperative, good eye contact  Casually dressed  Euthymic mood with appropriate affect  Engaged appropriately in session  Demonstrates good insight into discharge and transfer-of-care planning  A: Assessment Patient appears stable with decreased depressive and anxiety symptoms. Residual anxiety appears related to anticipatory stress regarding informing his parents of his upcoming relocation. No SI/HI reported. Patient demonstrates motivation for transition, future-orientation, and appropriate insight into care needs.  P: Plan  Reviewed transfer-of-care process, including obtaining a 90-day supply of  medications.  Discussed steps and resources for establishing mental health services in [place] .  Patient to delay selecting a new therapist until insurance information is confirmed.  Patient provided with contact information for [facility] for interim mental health support if travel plans change.  Continued encouragement for communication planning with family regarding relocation.  Patient agreeable to plan and verbalized understanding of next steps in transferring care.  Plan: Return again in 4 weeks.  Diagnosis: GAD (generalized anxiety disorder)  PTSD (post-traumatic stress disorder)  Collaboration of Care: Other    Patient/Guardian was advised Release of Information must be obtained prior to any record release in order to collaborate their care with an outside provider. Patient/Guardian was advised if they have not already done so to contact the registration department to sign all necessary forms in order for us  to release information regarding their care.   Consent: Patient/Guardian gives verbal consent for treatment and assignment of benefits for services provided during this visit. Patient/Guardian expressed understanding and agreed to proceed.   [name], LCSW 04/16/2024

## 2024-04-22 ENCOUNTER — Other Ambulatory Visit: Payer: Self-pay | Admitting: Emergency Medicine

## 2024-04-26 ENCOUNTER — Ambulatory Visit (INDEPENDENT_AMBULATORY_CARE_PROVIDER_SITE_OTHER): Payer: MEDICAID | Admitting: Emergency Medicine

## 2024-04-26 ENCOUNTER — Encounter: Payer: Self-pay | Admitting: Emergency Medicine

## 2024-04-26 ENCOUNTER — Encounter: Payer: MEDICAID | Admitting: Emergency Medicine

## 2024-04-26 VITALS — BP 120/100 | HR 118 | Temp 98.0°F | Ht 75.0 in | Wt 303.0 lb

## 2024-04-26 DIAGNOSIS — N5201 Erectile dysfunction due to arterial insufficiency: Secondary | ICD-10-CM

## 2024-04-26 DIAGNOSIS — I152 Hypertension secondary to endocrine disorders: Secondary | ICD-10-CM | POA: Diagnosis not present

## 2024-04-26 DIAGNOSIS — M5412 Radiculopathy, cervical region: Secondary | ICD-10-CM | POA: Diagnosis not present

## 2024-04-26 DIAGNOSIS — Z0000 Encounter for general adult medical examination without abnormal findings: Secondary | ICD-10-CM

## 2024-04-26 DIAGNOSIS — Z7984 Long term (current) use of oral hypoglycemic drugs: Secondary | ICD-10-CM | POA: Diagnosis not present

## 2024-04-26 DIAGNOSIS — F901 Attention-deficit hyperactivity disorder, predominantly hyperactive type: Secondary | ICD-10-CM

## 2024-04-26 DIAGNOSIS — I10 Essential (primary) hypertension: Secondary | ICD-10-CM

## 2024-04-26 DIAGNOSIS — Z7985 Long-term (current) use of injectable non-insulin antidiabetic drugs: Secondary | ICD-10-CM

## 2024-04-26 DIAGNOSIS — Z0001 Encounter for general adult medical examination with abnormal findings: Secondary | ICD-10-CM

## 2024-04-26 DIAGNOSIS — Z113 Encounter for screening for infections with a predominantly sexual mode of transmission: Secondary | ICD-10-CM

## 2024-04-26 DIAGNOSIS — E785 Hyperlipidemia, unspecified: Secondary | ICD-10-CM

## 2024-04-26 DIAGNOSIS — E1159 Type 2 diabetes mellitus with other circulatory complications: Secondary | ICD-10-CM

## 2024-04-26 MED ORDER — AMLODIPINE BESYLATE 10 MG PO TABS: 10.0000 mg | ORAL_TABLET | Freq: Every day | ORAL | 3 refills | Status: AC

## 2024-04-26 MED ORDER — GABAPENTIN 300 MG PO CAPS: 300.0000 mg | ORAL_CAPSULE | Freq: Two times a day (BID) | ORAL | 0 refills | Status: AC

## 2024-04-26 MED ORDER — ROSUVASTATIN CALCIUM 20 MG PO TABS: 20.0000 mg | ORAL_TABLET | Freq: Every day | ORAL | 3 refills | Status: AC

## 2024-04-26 NOTE — Assessment & Plan Note (Signed)
Diet and nutrition discussed. Lipid profile done today. Continue rosuvastatin 20 mg daily.  

## 2024-04-26 NOTE — Assessment & Plan Note (Signed)
 Well-controlled hypertension Continue amlodipine  10 mg along with Hyzaar 100-12.5 mg daily Much improved diabetes with hemoglobin A1c at 8.4. Continue daily basal insulin  50 units along with Premeal insulin  10 to 15 units.  Continues weekly semaglutide  2 mg Not taking glipizide  due to hypoglycemic episodes Cardiovascular risks associated with hypertension and diabetes discussed Diet and nutrition discussed Follow-up in 3 months

## 2024-04-26 NOTE — Progress Notes (Signed)
 [name] 43 y.o.   Chief Complaint  Patient presents with   Annual Exam    HISTORY OF PRESENT ILLNESS: This is a 43 y.o. male here for annual exam and follow-up on chronic medical conditions Overall doing well.  Has no complaints or medical concerns today.  HPI   Prior to Admission medications  Medication Sig Start Date End Date Taking? Authorizing Provider  acetaminophen  (TYLENOL ) 500 MG tablet Take 1,000 mg by mouth every 6 (six) hours as needed for mild pain.   Yes [provider]  amLODipine  (NORVASC ) 10 MG tablet TAKE 1 TABLET BY MOUTH EVERY DAY 10/13/23  Yes Neka Bise, Emil Schanz, MD  ARIPiprazole  (ABILIFY ) 5 MG tablet Take 1 tablet (5 mg total) by mouth daily. 02/02/24  Yes Kapoor, Sahil, MD  atomoxetine  (STRATTERA ) 40 MG capsule Take 1 capsule (40 mg total) by mouth daily. Needs appt with behavioral health for refill. 11/22/23  Yes Jhan Conery, Emil Schanz, MD  blood glucose meter kit and supplies Dispense based on patient and insurance preference. Use up to four times daily as directed. (FOR ICD-10 E10.9, E11.9). 01/16/19  Yes Sebastian Toribio GAILS, MD  Continuous Glucose Sensor (FREESTYLE LIBRE 3 PLUS SENSOR) MISC Change sensor every 15 days. 04/12/24  Yes Thapa, Sudan, MD  cyclobenzaprine  (FLEXERIL ) 10 MG tablet TAKE 1 TABLET BY MOUTH THREE TIMES A DAY AS NEEDED FOR MUSCLE SPASM 03/19/24  Yes Shamecca Whitebread, Emil Schanz, MD  gabapentin  (NEURONTIN ) 300 MG capsule TAKE 1 CAPSULE BY MOUTH THREE TIMES A DAY 03/30/24  Yes Javani Spratt, Emil Schanz, MD  hydrOXYzine  (ATARAX ) 25 MG tablet Take 1 tablet (25 mg total) by mouth 2 (two) times daily as needed for anxiety. 02/02/24  Yes Kapoor, Sahil, MD  injection device for insulin  (CEQUR SIMPLICITY 2U) DEVI 1 each by Other route every 3 (three) days. Use up to 8 click ( give 14 units) before meals 3 times a day. 04/12/24  Yes Thapa, Sudan, MD  Injection Device for Insulin  (CEQUR SIMPLICITY INSERTER) MISC Use as advised 12/21/23  Yes Thapa, Sudan,  MD  insulin  aspart (NOVOLOG ) 100 UNIT/ML injection Inject 16 Units into the skin 3 (three) times daily before meals. 04/12/24  Yes Thapa, Sudan, MD  insulin  glargine (LANTUS  SOLOSTAR) 100 UNIT/ML Solostar Pen Inject 30 Units into the skin daily. 04/12/24  Yes Thapa, Sudan, MD  Insulin  Pen Needle 32G X 4 MM MISC Use 4x a day 12/14/23  Yes Thapa, Sudan, MD  ketoconazole  (NIZORAL ) 2 % cream Apply 1 Application topically daily. Patient taking differently: Apply 1 Application topically daily as needed. 09/16/23  Yes Semon, Jake L, DPM  loratadine (CLARITIN) 10 MG tablet Take 10 mg by mouth as needed for allergies.   Yes [provider]  losartan -hydrochlorothiazide (HYZAAR) 100-12.5 MG tablet TAKE 1 TABLET BY MOUTH EVERY DAY 10/13/23  Yes Maui Britten Jose, MD  mupirocin  ointment (BACTROBAN ) 2 % Apply 1 Application topically daily. Patient taking differently: Apply 1 Application topically as needed. 09/29/23  Yes Standiford, Marsa FALCON, DPM  omeprazole  (PRILOSEC) 40 MG capsule TAKE 1 CAPSULE (40 MG TOTAL) BY MOUTH 2 (TWO) TIMES DAILY BEFORE A MEAL. 11/10/22  Yes Avram Lupita BRAVO, MD  OXcarbazepine  (TRILEPTAL ) 150 MG tablet TAKE 1 TABLET BY MOUTH TWICE A DAY 10/13/23  Yes Camara, Amadou, MD  rosuvastatin  (CRESTOR ) 20 MG tablet TAKE 1 TABLET BY MOUTH EVERY DAY 10/13/23  Yes Johnathon Mittal, Emil Schanz, MD  Semaglutide , 2 MG/DOSE, 8 MG/3ML SOPN Inject 2 mg as directed once a week. 04/12/24  Yes Thapa, Sudan, MD  sildenafil  (VIAGRA ) 100 MG tablet TAKE 1/2 - 1 TABLET BY MOUTH DAILY AS NEEDED FOR ERECTILE DYSFUNCTION Patient taking differently: as needed. 09/13/23  Yes Deniqua Perry, Emil Schanz, MD  Testosterone  Enanthate (XYOSTED ) 100 MG/0.5ML SOAJ INJECT 0.5 MLS (100 MG TOTAL) INTO THE SKIN ONCE A WEEK. 04/23/24  Yes Derrian Poli, Emil Schanz, MD  vortioxetine  HBr (TRINTELLIX ) 10 MG TABS tablet Take 1 tablet (10 mg total) by mouth daily. 02/02/24  Yes Kapoor, Sahil, MD  silver  sulfADIAZINE  (SILVADENE ) 1 % cream Apply 1  Application topically daily. Patient not taking: Reported on 04/26/2024 07/28/23   Gershon Donnice SAUNDERS, DPM  tadalafil  (CIALIS ) 20 MG tablet Take 0.5-1 tablets (10-20 mg total) by mouth every other day as needed for erectile dysfunction. 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) 02/07/2020   Morbid obesity, unspecified obesity type (HCC) 01/10/2020   Hypertension associated with diabetes (HCC) 04/17/2018   Dyslipidemia 04/17/2018   PTSD (post-traumatic stress disorder) 08/11/2016   Spinal stenosis, lumbar region with neurogenic claudication 05/05/2016   Dysthymia 09/02/2015   Chronic low back pain 09/02/2015   Dyslipidemia associated with type 2 diabetes mellitus (HCC) 03/11/2008   UNSPECIFIED ANEMIA 03/11/2008    Past Medical History:  Diagnosis Date   Allergies    Allergy    Anemia    Anxiety    Arthritis    DDD (degenerative disc disease), lumbar    cervical   Depression    Diabetes mellitus    Epilepsy (HCC)    GERD (gastroesophageal reflux disease)    Hyperlipidemia    Hypertension    Lumbar disc herniation    Neuromuscular disorder (HCC)    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    Pneumonia    PTSD (post-traumatic stress disorder)    Scoliosis    Sleep apnea     Past Surgical History:  Procedure Laterality Date   LUMBAR LAMINECTOMY/DECOMPRESSION MICRODISCECTOMY Left 05/05/2016   Procedure: CENTRAL DECOMPRESSION L4-L5 AND  FORAMINOTOMY FOR L4 ROOT AND L5 ROOT ON THE LEFT;  Surgeon: Tanda Heading, MD;  Location: [facility];  Service: Orthopedics;  Laterality: Left;   METATARSAL HEAD EXCISION Left 09/21/2023   Procedure: EXCISION, METATARSAL BONE, HEAD and graft application;  Surgeon: Malvin Marsa FALCON, DPM;  Location: [facility];  Service: Orthopedics/Podiatry;  Laterality: Left;  3RD LEFT TOE AND BIOPSY OF 2ND toe   NO PAST SURGERIES     ulcer excision and I&D of abscess by Podiatry   04/2023   [facility]    Social History   Socioeconomic History   Marital status: Divorced    Spouse name: Not on file   Number of children: Not on file   Years of education: Not on file   Highest education level: 12th grade  Occupational History   Not on file  Tobacco Use   Smoking status: Former    Types: Cigars   Smokeless tobacco: Current    Types: Snuff   Tobacco comments:  Patient states will consider quitting in future but not interested right now.   Vaping Use   Vaping status: Never Used  Substance and Sexual Activity   Alcohol use: Not Currently    Comment: few times year   Drug use: No   Sexual activity: Yes    Partners: Female    Comment: multiple partners.  Other Topics Concern   Not on file  Social History Narrative   Lives with family   Social Drivers of Health   Tobacco Use: High Risk (04/26/2024)   Patient History    Smoking Tobacco Use: Former    Smokeless Tobacco Use: Current    Passive Exposure: Not on file  Financial Resource Strain: Medium Risk (03/13/2024)   Overall Financial Resource Strain (CARDIA)    Difficulty of Paying Living Expenses: Somewhat hard  Food Insecurity: Food Insecurity Present (03/13/2024)   Epic    Worried About Programme Researcher, Broadcasting/film/video in the Last Year: Sometimes true    [employer] of Food in the Last Year: Often true  Transportation Needs: No Transportation Needs (03/13/2024)   Epic    Lack of Transportation (Medical): No    Lack of Transportation (Non-Medical): No   Physical Activity: Sufficiently Active (03/13/2024)   Exercise Vital Sign    Days of Exercise per Week: 3 days    Minutes of Exercise per Session: 60 min  Stress: Stress Concern Present (03/13/2024)   [employer] of Occupational Health - Occupational Stress Questionnaire    Feeling of Stress: Very much  Social Connections: Moderately Integrated (03/13/2024)   Social Connection and Isolation Panel    Frequency of Communication with Friends and Family: More than three times a week    Frequency of Social Gatherings with Friends and Family: More than three times a week    Attends Religious Services: More than 4 times per year    Active Member of [employer] or Organizations: Yes    Attends Banker Meetings: More than 4 times per year    Marital Status: Divorced  Intimate Partner Violence: Not At Risk (04/18/2023)   Humiliation, Afraid, Rape, and Kick questionnaire    Fear of Current or Ex-Partner: No    Emotionally Abused: No    Physically Abused: No    Sexually Abused: No  Depression (PHQ2-9): Low Risk (04/26/2024)   Depression (PHQ2-9)    PHQ-2 Score: 0  Alcohol Screen: Low Risk (03/13/2024)   Alcohol Screen    Last Alcohol Screening Score (AUDIT): 2  Housing: High Risk (03/13/2024)   Epic    Unable to Pay for Housing in the Last Year: Yes    Number of Times Moved in the Last Year: 1    Homeless in the Last Year: Yes  Utilities: Not At Risk (04/18/2023)   [employer] Utilities    Threatened with loss of utilities: No  Health Literacy: Adequate Health Literacy (04/18/2023)   B1300 Health Literacy    Frequency of need for help with medical instructions: Rarely    Family History  Problem Relation Age of Onset   Stroke Maternal Grandmother    Colon cancer Neg Hx    Esophageal cancer Neg Hx    Rectal cancer Neg Hx    Stomach cancer Neg Hx      Review of Systems  Constitutional: Negative.  Negative for chills and fever.  HENT: Negative.  Negative for congestion and sore  throat.   Respiratory: Negative.  Negative for cough and shortness of breath.  Cardiovascular: Negative.  Negative for chest pain and palpitations.  Gastrointestinal:  Negative for abdominal pain, diarrhea, nausea and vomiting.  Genitourinary: Negative.  Negative for dysuria and hematuria.  Skin: Negative.  Negative for rash.  Neurological: Negative.  Negative for dizziness and headaches.  All other systems reviewed and are negative.   Vitals:   04/26/24 1416  BP: (!) 120/100  Pulse: (!) 118  Temp: 98 F (36.7 C)  SpO2: 98%    Physical Exam Vitals reviewed.  Constitutional:      Appearance: Normal appearance.  HENT:     Head: Normocephalic.     Mouth/Throat:     Mouth: Mucous membranes are moist.     Pharynx: Oropharynx is clear.  Eyes:     Extraocular Movements: Extraocular movements intact.     Conjunctiva/sclera: Conjunctivae normal.     Pupils: Pupils are equal, round, and reactive to light.  Cardiovascular:     Rate and Rhythm: Normal rate and regular rhythm.     Pulses: Normal pulses.     Heart sounds: Normal heart sounds.  Pulmonary:     Effort: Pulmonary effort is normal.     Breath sounds: Normal breath sounds.  Abdominal:     Palpations: Abdomen is soft.     Tenderness: There is no abdominal tenderness.  Musculoskeletal:     Cervical back: No tenderness.     Right lower leg: No edema.     Left lower leg: No edema.  Lymphadenopathy:     Cervical: No cervical adenopathy.  Skin:    General: Skin is warm and dry.     Capillary Refill: Capillary refill takes less than 2 seconds.  Neurological:     General: No focal deficit present.     Mental Status: He is alert and oriented to person, place, and time.  Psychiatric:        Mood and Affect: Mood normal.        Behavior: Behavior normal.      ASSESSMENT & PLAN: Problem List Items Addressed This Visit       Cardiovascular and Mediastinum   Hypertension associated with diabetes (HCC)    Well-controlled hypertension Continue amlodipine  10 mg along with Hyzaar 100-12.5 mg daily Much improved diabetes with hemoglobin A1c at 8.4. Continue daily basal insulin  50 units along with Premeal insulin  10 to 15 units.  Continues weekly semaglutide  2 mg Not taking glipizide  due to hypoglycemic episodes Cardiovascular risks associated with hypertension and diabetes discussed Diet and nutrition discussed Follow-up in 3 months      Relevant Medications   amLODipine  (NORVASC ) 10 MG tablet   losartan -hydrochlorothiazide (HYZAAR) 100-12.5 MG tablet   rosuvastatin  (CRESTOR ) 20 MG tablet   sildenafil  (VIAGRA ) 100 MG tablet   Erectile dysfunction due to arterial insufficiency   Relevant Medications   amLODipine  (NORVASC ) 10 MG tablet   losartan -hydrochlorothiazide (HYZAAR) 100-12.5 MG tablet   rosuvastatin  (CRESTOR ) 20 MG tablet   sildenafil  (VIAGRA ) 100 MG tablet     Other   Dyslipidemia   Diet and nutrition discussed.  Lipid profile done today. Continue rosuvastatin  20 mg daily.      Relevant Medications   rosuvastatin  (CRESTOR ) 20 MG tablet   Attention deficit hyperactivity disorder (ADHD)   Other Visit Diagnoses       Encounter for general adult medical examination with abnormal findings    -  Primary     Essential hypertension       Relevant Medications   amLODipine  (NORVASC ) 10 MG  tablet   losartan -hydrochlorothiazide (HYZAAR) 100-12.5 MG tablet   rosuvastatin  (CRESTOR ) 20 MG tablet   sildenafil  (VIAGRA ) 100 MG tablet     Cervical radiculopathy at C7       Relevant Medications   cyclobenzaprine  (FLEXERIL ) 10 MG tablet   gabapentin  (NEURONTIN ) 300 MG capsule     Routine screening for STI (sexually transmitted infection)       Relevant Orders   RPR W/RFLX TO RPR TITER, TREPONEMAL AB, SCREEN AND DIAGNOSIS   Hepatitis C antibody   HIV Antibody (routine testing w rflx)   GC/Chlamydia Probe Amp      Modifiable risk factors discussed with patient. Anticipatory  guidance according to age provided. The following topics were also discussed: Social Determinants of Health Smoking.  Non-smoker Diet and nutrition Benefits of exercise Cancer family history review Vaccinations review and recommendations Cardiovascular risk assessment Review of multiple chronic medical conditions under management Review of all medications Mental health including depression and anxiety Fall and accident prevention  Patient Instructions  Health Maintenance, Male Adopting a healthy lifestyle and getting preventive care are important in promoting health and wellness. Ask your health care provider about: The right schedule for you to have regular tests and exams. Things you can do on your own to prevent diseases and keep yourself healthy. What should I know about diet, weight, and exercise? Eat a healthy diet  Eat a diet that includes plenty of vegetables, fruits, low-fat dairy products, and lean protein. Do not eat a lot of foods that are high in solid fats, added sugars, or sodium. Maintain a healthy weight Body mass index (BMI) is a measurement that can be used to identify possible weight problems. It estimates body fat based on height and weight. Your health care provider can help determine your BMI and help you achieve or maintain a healthy weight. Get regular exercise Get regular exercise. This is one of the most important things you can do for your health. Most adults should: Exercise for at least 150 minutes each week. The exercise should increase your heart rate and make you sweat (moderate-intensity exercise). Do strengthening exercises at least twice a week. This is in addition to the moderate-intensity exercise. Spend less time sitting. Even light physical activity can be beneficial. Watch cholesterol and blood lipids Have your blood tested for lipids and cholesterol at 41 years of age, then have this test every 5 years. You may need to have your cholesterol  levels checked more often if: Your lipid or cholesterol levels are high. You are older than 41 years of age. You are at high risk for heart disease. What should I know about cancer screening? Many types of cancers can be detected early and may often be prevented. Depending on your health history and family history, you may need to have cancer screening at various ages. This may include screening for: Colorectal cancer. Prostate cancer. Skin cancer. Lung cancer. What should I know about heart disease, diabetes, and high blood pressure? Blood pressure and heart disease High blood pressure causes heart disease and increases the risk of stroke. This is more likely to develop in people who have high blood pressure readings or are overweight. Talk with your health care provider about your target blood pressure readings. Have your blood pressure checked: Every 3-5 years if you are 74-19 years of age. Every year if you are 58 years old or older. If you are between the ages of 65 and 54 and are a current or former smoker,  ask your health care provider if you should have a one-time screening for abdominal aortic aneurysm (AAA). Diabetes Have regular diabetes screenings. This checks your fasting blood sugar level. Have the screening done: Once every three years after age 72 if you are at a normal weight and have a low risk for diabetes. More often and at a younger age if you are overweight or have a high risk for diabetes. What should I know about preventing infection? Hepatitis B If you have a higher risk for hepatitis B, you should be screened for this virus. Talk with your health care provider to find out if you are at risk for hepatitis B infection. Hepatitis C Blood testing is recommended for: Everyone born from [birth year] through 1963. Anyone with known risk factors for hepatitis C. Sexually transmitted infections (STIs) You should be screened each year for STIs, including gonorrhea and chlamydia,  if: You are sexually active and are younger than 41 years of age. You are older than 41 years of age and your health care provider tells you that you are at risk for this type of infection. Your sexual activity has changed since you were last screened, and you are at increased risk for chlamydia or gonorrhea. Ask your health care provider if you are at risk. Ask your health care provider about whether you are at high risk for HIV. Your health care provider may recommend a prescription medicine to help prevent HIV infection. If you choose to take medicine to prevent HIV, you should first get tested for HIV. You should then be tested every 3 months for as long as you are taking the medicine. Follow these instructions at home: Alcohol use Do not drink alcohol if your health care provider tells you not to drink. If you drink alcohol: Limit how much you have to 0-2 drinks a day. Know how much alcohol is in your drink. In the [place] one drink equals one 12 oz bottle of beer (355 mL), one 5 oz glass of wine (148 mL), or one 1 oz glass of hard liquor (44 mL). Lifestyle Do not use any products that contain nicotine or tobacco. These products include cigarettes, chewing tobacco, and vaping devices, such as e-cigarettes. If you need help quitting, ask your health care provider. Do not use street drugs. Do not share needles. Ask your health care provider for help if you need support or information about quitting drugs. General instructions Schedule regular health, dental, and eye exams. Stay current with your vaccines. Tell your health care provider if: You often feel depressed. You have ever been abused or do not feel safe at home. Summary Adopting a healthy lifestyle and getting preventive care are important in promoting health and wellness. Follow your health care provider's instructions about healthy diet, exercising, and getting tested or screened for diseases. Follow your health care provider's  instructions on monitoring your cholesterol and blood pressure. This information is not intended to replace advice given to you by your health care provider. Make sure you discuss any questions you have with your health care provider. Document Revised: 09/15/2020 Document Reviewed: 09/15/2020 Elsevier Patient Education  2024 Elsevier Inc.     Emil Schaumann, MD [facility]     [1]  Allergies Allergen Reactions   Amoxicillin  Anaphylaxis and Other (See Comments)    Has patient had a PCN reaction causing immediate rash, facial/tongue/throat swelling, SOB or lightheadedness with hypotension: yes Has patient had a PCN reaction causing severe rash involving  mucus membranes or skin necrosis: no Has patient had a PCN reaction that required hospitalization yes Has patient had a PCN reaction occurring within the last 10 years: yes If all of the above answers are NO, then may proceed with Cephalosporin use.   Penicillins Anaphylaxis and Other (See Comments)    Patient was told to list this as an allergy because he is allergic to Amoxicillin  Has patient had a PCN reaction causing immediate rash, facial/tongue/throat swelling, SOB or lightheadedness with hypotension: yes for Amoxicillin  Has patient had a PCN reaction causing severe rash involving mucus membranes or skin necrosis: no Has patient had a PCN reaction that required hospitalization yes for Amoxicillin  Has patient had a PCN reaction occurring within the last 10 years: yes for Amoxicillin  If all o   Metformin  And Related Other (See Comments)    SEVERE GI UPSET   Other Other (See Comments)    Powder in some gloves - localized itching but not allergic to latex, benadryl  usually helps with this reaction   Latex Itching   Zolpidem Other (See Comments)    Passed out. And memory loss

## 2024-04-26 NOTE — Patient Instructions (Signed)
 Health Maintenance, Male  Adopting a healthy lifestyle and getting preventive care are important in promoting health and wellness. Ask your health care provider about:  The right schedule for you to have regular tests and exams.  Things you can do on your own to prevent diseases and keep yourself healthy.  What should I know about diet, weight, and exercise?  Eat a healthy diet    Eat a diet that includes plenty of vegetables, fruits, low-fat dairy products, and lean protein.  Do not eat a lot of foods that are high in solid fats, added sugars, or sodium.  Maintain a healthy weight  Body mass index (BMI) is a measurement that can be used to identify possible weight problems. It estimates body fat based on height and weight. Your health care provider can help determine your BMI and help you achieve or maintain a healthy weight.  Get regular exercise  Get regular exercise. This is one of the most important things you can do for your health. Most adults should:  Exercise for at least 150 minutes each week. The exercise should increase your heart rate and make you sweat (moderate-intensity exercise).  Do strengthening exercises at least twice a week. This is in addition to the moderate-intensity exercise.  Spend less time sitting. Even light physical activity can be beneficial.  Watch cholesterol and blood lipids  Have your blood tested for lipids and cholesterol at 41 years of age, then have this test every 5 years.  You may need to have your cholesterol levels checked more often if:  Your lipid or cholesterol levels are high.  You are older than 41 years of age.  You are at high risk for heart disease.  What should I know about cancer screening?  Many types of cancers can be detected early and may often be prevented. Depending on your health history and family history, you may need to have cancer screening at various ages. This may include screening for:  Colorectal cancer.  Prostate cancer.  Skin cancer.  Lung  cancer.  What should I know about heart disease, diabetes, and high blood pressure?  Blood pressure and heart disease  High blood pressure causes heart disease and increases the risk of stroke. This is more likely to develop in people who have high blood pressure readings or are overweight.  Talk with your health care provider about your target blood pressure readings.  Have your blood pressure checked:  Every 3-5 years if you are 24-52 years of age.  Every year if you are 3 years old or older.  If you are between the ages of 60 and 72 and are a current or former smoker, ask your health care provider if you should have a one-time screening for abdominal aortic aneurysm (AAA).  Diabetes  Have regular diabetes screenings. This checks your fasting blood sugar level. Have the screening done:  Once every three years after age 66 if you are at a normal weight and have a low risk for diabetes.  More often and at a younger age if you are overweight or have a high risk for diabetes.  What should I know about preventing infection?  Hepatitis B  If you have a higher risk for hepatitis B, you should be screened for this virus. Talk with your health care provider to find out if you are at risk for hepatitis B infection.  Hepatitis C  Blood testing is recommended for:  Everyone born from [birth year] through 1963.  Anyone  with known risk factors for hepatitis C.  Sexually transmitted infections (STIs)  You should be screened each year for STIs, including gonorrhea and chlamydia, if:  You are sexually active and are younger than 41 years of age.  You are older than 41 years of age and your health care provider tells you that you are at risk for this type of infection.  Your sexual activity has changed since you were last screened, and you are at increased risk for chlamydia or gonorrhea. Ask your health care provider if you are at risk.  Ask your health care provider about whether you are at high risk for HIV. Your health care provider  may recommend a prescription medicine to help prevent HIV infection. If you choose to take medicine to prevent HIV, you should first get tested for HIV. You should then be tested every 3 months for as long as you are taking the medicine.  Follow these instructions at home:  Alcohol use  Do not drink alcohol if your health care provider tells you not to drink.  If you drink alcohol:  Limit how much you have to 0-2 drinks a day.  Know how much alcohol is in your drink. In the [place] one drink equals one 12 oz bottle of beer (355 mL), one 5 oz glass of wine (148 mL), or one 1 oz glass of hard liquor (44 mL).  Lifestyle  Do not use any products that contain nicotine or tobacco. These products include cigarettes, chewing tobacco, and vaping devices, such as e-cigarettes. If you need help quitting, ask your health care provider.  Do not use street drugs.  Do not share needles.  Ask your health care provider for help if you need support or information about quitting drugs.  General instructions  Schedule regular health, dental, and eye exams.  Stay current with your vaccines.  Tell your health care provider if:  You often feel depressed.  You have ever been abused or do not feel safe at home.  Summary  Adopting a healthy lifestyle and getting preventive care are important in promoting health and wellness.  Follow your health care provider's instructions about healthy diet, exercising, and getting tested or screened for diseases.  Follow your health care provider's instructions on monitoring your cholesterol and blood pressure.  This information is not intended to replace advice given to you by your health care provider. Make sure you discuss any questions you have with your health care provider.  Document Revised: 09/15/2020 Document Reviewed: 09/15/2020  [employer]vier Patient Education  2024 [employer].

## 2024-04-28 LAB — HIV ANTIBODY (ROUTINE TESTING W REFLEX)
HIV 1&2 Ab, 4th Generation: NONREACTIVE
HIV FINAL INTERPRETATION: NEGATIVE

## 2024-04-28 LAB — SYPHILIS: RPR W/REFLEX TO RPR TITER AND TREPONEMAL ANTIBODIES, TRADITIONAL SCREENING AND DIAGNOSIS ALGORITHM: RPR Ser Ql: NONREACTIVE

## 2024-04-28 LAB — HEPATITIS C ANTIBODY: Hepatitis C Ab: NONREACTIVE

## 2024-04-29 LAB — GC/CHLAMYDIA PROBE AMP
Chlamydia trachomatis, NAA: NEGATIVE
Neisseria Gonorrhoeae by PCR: NEGATIVE

## 2024-04-30 ENCOUNTER — Ambulatory Visit: Payer: MEDICAID | Admitting: Podiatry

## 2024-04-30 DIAGNOSIS — E119 Type 2 diabetes mellitus without complications: Secondary | ICD-10-CM

## 2024-04-30 DIAGNOSIS — M79674 Pain in right toe(s): Secondary | ICD-10-CM

## 2024-04-30 DIAGNOSIS — B351 Tinea unguium: Secondary | ICD-10-CM

## 2024-04-30 DIAGNOSIS — M79675 Pain in left toe(s): Secondary | ICD-10-CM

## 2024-04-30 NOTE — Progress Notes (Signed)

## 2024-05-02 ENCOUNTER — Encounter: Payer: Self-pay | Admitting: Emergency Medicine

## 2024-05-14 ENCOUNTER — Encounter: Payer: MEDICAID | Admitting: Emergency Medicine

## 2024-05-21 ENCOUNTER — Ambulatory Visit: Admitting: Endocrinology

## 2024-06-15 ENCOUNTER — Other Ambulatory Visit: Payer: Self-pay

## 2024-06-15 DIAGNOSIS — E1165 Type 2 diabetes mellitus with hyperglycemia: Secondary | ICD-10-CM

## 2024-06-15 MED ORDER — CEQUR SIMPLICITY INSERTER MISC: 0 refills | Status: AC

## 2024-09-11 ENCOUNTER — Ambulatory Visit: Admitting: Neurology

## 2024-09-25 ENCOUNTER — Ambulatory Visit: Admitting: Neurology

## 2024-10-25 ENCOUNTER — Ambulatory Visit: Admitting: Neurology
